# Patient Record
Sex: Female | Born: 1940 | Race: White | Hispanic: No | Marital: Married | Smoking: Former smoker
Health system: Southern US, Community
[De-identification: ages and names within clinical notes are randomized; demographics above are authoritative.]

## PROBLEM LIST (undated history)

## (undated) DIAGNOSIS — K635 Polyp of colon: Secondary | ICD-10-CM

## (undated) DIAGNOSIS — R51 Headache: Secondary | ICD-10-CM

## (undated) DIAGNOSIS — R011 Cardiac murmur, unspecified: Secondary | ICD-10-CM

## (undated) DIAGNOSIS — R519 Headache, unspecified: Secondary | ICD-10-CM

## (undated) DIAGNOSIS — I10 Essential (primary) hypertension: Secondary | ICD-10-CM

## (undated) DIAGNOSIS — D649 Anemia, unspecified: Secondary | ICD-10-CM

## (undated) DIAGNOSIS — I671 Cerebral aneurysm, nonruptured: Secondary | ICD-10-CM

## (undated) DIAGNOSIS — J302 Other seasonal allergic rhinitis: Secondary | ICD-10-CM

## (undated) DIAGNOSIS — C50919 Malignant neoplasm of unspecified site of unspecified female breast: Secondary | ICD-10-CM

## (undated) DIAGNOSIS — Z973 Presence of spectacles and contact lenses: Secondary | ICD-10-CM

## (undated) DIAGNOSIS — E039 Hypothyroidism, unspecified: Secondary | ICD-10-CM

## (undated) DIAGNOSIS — Z9221 Personal history of antineoplastic chemotherapy: Secondary | ICD-10-CM

## (undated) DIAGNOSIS — E7800 Pure hypercholesterolemia, unspecified: Secondary | ICD-10-CM

## (undated) DIAGNOSIS — M1990 Unspecified osteoarthritis, unspecified site: Secondary | ICD-10-CM

## (undated) DIAGNOSIS — H269 Unspecified cataract: Secondary | ICD-10-CM

## (undated) DIAGNOSIS — G629 Polyneuropathy, unspecified: Secondary | ICD-10-CM

## (undated) DIAGNOSIS — L719 Rosacea, unspecified: Secondary | ICD-10-CM

## (undated) DIAGNOSIS — G459 Transient cerebral ischemic attack, unspecified: Secondary | ICD-10-CM

## (undated) DIAGNOSIS — K4090 Unilateral inguinal hernia, without obstruction or gangrene, not specified as recurrent: Secondary | ICD-10-CM

## (undated) DIAGNOSIS — R42 Dizziness and giddiness: Secondary | ICD-10-CM

## (undated) DIAGNOSIS — I679 Cerebrovascular disease, unspecified: Secondary | ICD-10-CM

## (undated) DIAGNOSIS — Z923 Personal history of irradiation: Secondary | ICD-10-CM

## (undated) HISTORY — DX: Unilateral inguinal hernia, without obstruction or gangrene, not specified as recurrent: K40.90

## (undated) HISTORY — PX: ESOPHAGOGASTRODUODENOSCOPY: SHX1529

## (undated) HISTORY — DX: Unspecified cataract: H26.9

## (undated) HISTORY — PX: COLONOSCOPY W/ POLYPECTOMY: SHX1380

## (undated) HISTORY — DX: Cerebrovascular disease, unspecified: I67.9

## (undated) HISTORY — DX: Anemia, unspecified: D64.9

## (undated) HISTORY — DX: Malignant neoplasm of unspecified site of unspecified female breast: C50.919

## (undated) HISTORY — DX: Personal history of irradiation: Z92.3

## (undated) HISTORY — DX: Cerebral aneurysm, nonruptured: I67.1

## (undated) HISTORY — DX: Pure hypercholesterolemia, unspecified: E78.00

## (undated) HISTORY — DX: Hypothyroidism, unspecified: E03.9

## (undated) HISTORY — DX: Essential (primary) hypertension: I10

## (undated) HISTORY — PX: MASTECTOMY: SHX3

## (undated) HISTORY — PX: COLONOSCOPY: SHX174

## (undated) HISTORY — DX: Polyp of colon: K63.5

## (undated) HISTORY — DX: Transient cerebral ischemic attack, unspecified: G45.9

---

## 1983-03-07 ENCOUNTER — Encounter: Payer: Self-pay | Admitting: Internal Medicine

## 1999-01-31 ENCOUNTER — Other Ambulatory Visit: Admission: RE | Admit: 1999-01-31 | Discharge: 1999-01-31 | Payer: Self-pay | Admitting: Obstetrics and Gynecology

## 1999-05-26 ENCOUNTER — Encounter: Payer: Self-pay | Admitting: Internal Medicine

## 2000-01-27 ENCOUNTER — Other Ambulatory Visit: Admission: RE | Admit: 2000-01-27 | Discharge: 2000-01-27 | Payer: Self-pay | Admitting: Obstetrics and Gynecology

## 2000-04-05 ENCOUNTER — Encounter (INDEPENDENT_AMBULATORY_CARE_PROVIDER_SITE_OTHER): Payer: Self-pay

## 2000-04-05 ENCOUNTER — Other Ambulatory Visit: Admission: RE | Admit: 2000-04-05 | Discharge: 2000-04-05 | Payer: Self-pay | Admitting: Obstetrics and Gynecology

## 2001-02-21 ENCOUNTER — Other Ambulatory Visit: Admission: RE | Admit: 2001-02-21 | Discharge: 2001-02-21 | Payer: Self-pay | Admitting: Obstetrics and Gynecology

## 2002-03-11 ENCOUNTER — Other Ambulatory Visit: Admission: RE | Admit: 2002-03-11 | Discharge: 2002-03-11 | Payer: Self-pay | Admitting: Obstetrics and Gynecology

## 2002-08-14 HISTORY — PX: OTHER SURGICAL HISTORY: SHX169

## 2003-01-07 ENCOUNTER — Encounter: Payer: Self-pay | Admitting: Pulmonary Disease

## 2003-01-07 ENCOUNTER — Ambulatory Visit (HOSPITAL_COMMUNITY): Admission: RE | Admit: 2003-01-07 | Discharge: 2003-01-07 | Payer: Self-pay | Admitting: Pulmonary Disease

## 2003-01-08 ENCOUNTER — Inpatient Hospital Stay (HOSPITAL_COMMUNITY): Admission: EM | Admit: 2003-01-08 | Discharge: 2003-01-10 | Payer: Self-pay | Admitting: Emergency Medicine

## 2003-01-08 ENCOUNTER — Encounter: Payer: Self-pay | Admitting: Pulmonary Disease

## 2003-01-09 ENCOUNTER — Encounter: Payer: Self-pay | Admitting: Pulmonary Disease

## 2003-01-12 ENCOUNTER — Encounter: Payer: Self-pay | Admitting: Neurology

## 2003-01-12 ENCOUNTER — Ambulatory Visit (HOSPITAL_COMMUNITY): Admission: RE | Admit: 2003-01-12 | Discharge: 2003-01-12 | Payer: Self-pay | Admitting: Neurology

## 2003-01-21 ENCOUNTER — Ambulatory Visit (HOSPITAL_COMMUNITY): Admission: RE | Admit: 2003-01-21 | Discharge: 2003-01-22 | Payer: Self-pay | Admitting: Interventional Radiology

## 2003-04-08 ENCOUNTER — Other Ambulatory Visit: Admission: RE | Admit: 2003-04-08 | Discharge: 2003-04-08 | Payer: Self-pay | Admitting: Obstetrics and Gynecology

## 2003-05-14 ENCOUNTER — Ambulatory Visit (HOSPITAL_COMMUNITY): Admission: RE | Admit: 2003-05-14 | Discharge: 2003-05-14 | Payer: Self-pay | Admitting: Rheumatology

## 2003-10-09 ENCOUNTER — Ambulatory Visit (HOSPITAL_COMMUNITY): Admission: RE | Admit: 2003-10-09 | Discharge: 2003-10-09 | Payer: Self-pay | Admitting: Interventional Radiology

## 2004-04-22 ENCOUNTER — Ambulatory Visit (HOSPITAL_COMMUNITY): Admission: RE | Admit: 2004-04-22 | Discharge: 2004-04-22 | Payer: Self-pay | Admitting: Interventional Radiology

## 2004-08-14 DIAGNOSIS — I639 Cerebral infarction, unspecified: Secondary | ICD-10-CM

## 2004-08-14 HISTORY — DX: Cerebral infarction, unspecified: I63.9

## 2004-09-21 ENCOUNTER — Ambulatory Visit: Payer: Self-pay | Admitting: Pulmonary Disease

## 2004-09-22 ENCOUNTER — Ambulatory Visit: Payer: Self-pay | Admitting: Pulmonary Disease

## 2005-02-17 ENCOUNTER — Ambulatory Visit (HOSPITAL_COMMUNITY): Admission: RE | Admit: 2005-02-17 | Discharge: 2005-02-17 | Payer: Self-pay | Admitting: Interventional Radiology

## 2005-11-13 ENCOUNTER — Ambulatory Visit: Payer: Self-pay | Admitting: Pulmonary Disease

## 2005-12-27 ENCOUNTER — Ambulatory Visit: Payer: Self-pay | Admitting: Pulmonary Disease

## 2006-09-27 ENCOUNTER — Ambulatory Visit: Payer: Self-pay | Admitting: Pulmonary Disease

## 2006-10-18 ENCOUNTER — Ambulatory Visit: Payer: Self-pay | Admitting: Pulmonary Disease

## 2006-10-18 LAB — CONVERTED CEMR LAB
AST: 36 units/L (ref 0–37)
Albumin: 4.2 g/dL (ref 3.5–5.2)
Basophils Absolute: 0.2 10*3/uL — ABNORMAL HIGH (ref 0.0–0.1)
Basophils Relative: 4.1 % — ABNORMAL HIGH (ref 0.0–1.0)
Bilirubin, Direct: 0.2 mg/dL (ref 0.0–0.3)
CO2: 29 meq/L (ref 19–32)
Chloride: 106 meq/L (ref 96–112)
Cholesterol: 180 mg/dL (ref 0–200)
Creatinine, Ser: 0.7 mg/dL (ref 0.4–1.2)
Direct LDL: 104.7 mg/dL
Eosinophils Relative: 4.1 % (ref 0.0–5.0)
GFR calc non Af Amer: 90 mL/min
HDL: 49.1 mg/dL (ref >39.0)
Lymphocytes Relative: 35.9 % (ref 12.0–46.0)
MCV: 86.6 fL (ref 78.0–100.0)
Monocytes Absolute: 0.5 10*3/uL (ref 0.2–0.7)
Potassium: 4.5 meq/L (ref 3.5–5.1)
RBC: 4.83 M/uL (ref 3.87–5.11)
Total CHOL/HDL Ratio: 3.7
Total Protein: 7.3 g/dL (ref 6.0–8.3)
Triglycerides: 206 mg/dL (ref 0–149)
WBC: 5.8 10*3/uL (ref 4.5–10.5)

## 2006-10-23 ENCOUNTER — Ambulatory Visit: Payer: Self-pay | Admitting: Pulmonary Disease

## 2007-12-02 DIAGNOSIS — F411 Generalized anxiety disorder: Secondary | ICD-10-CM | POA: Insufficient documentation

## 2007-12-02 DIAGNOSIS — M1990 Unspecified osteoarthritis, unspecified site: Secondary | ICD-10-CM

## 2007-12-02 DIAGNOSIS — G459 Transient cerebral ischemic attack, unspecified: Secondary | ICD-10-CM

## 2007-12-02 DIAGNOSIS — M810 Age-related osteoporosis without current pathological fracture: Secondary | ICD-10-CM

## 2007-12-02 DIAGNOSIS — E039 Hypothyroidism, unspecified: Secondary | ICD-10-CM

## 2007-12-02 DIAGNOSIS — E7800 Pure hypercholesterolemia, unspecified: Secondary | ICD-10-CM | POA: Insufficient documentation

## 2007-12-02 DIAGNOSIS — IMO0001 Reserved for inherently not codable concepts without codable children: Secondary | ICD-10-CM

## 2008-05-12 ENCOUNTER — Ambulatory Visit: Payer: Self-pay | Admitting: Pulmonary Disease

## 2008-05-12 DIAGNOSIS — I10 Essential (primary) hypertension: Secondary | ICD-10-CM | POA: Insufficient documentation

## 2008-05-12 DIAGNOSIS — K649 Unspecified hemorrhoids: Secondary | ICD-10-CM | POA: Insufficient documentation

## 2008-05-14 ENCOUNTER — Ambulatory Visit: Payer: Self-pay | Admitting: Pulmonary Disease

## 2008-05-14 LAB — CONVERTED CEMR LAB: Vit D, 1,25-Dihydroxy: 54 (ref 30–89)

## 2008-05-21 LAB — CONVERTED CEMR LAB
Alkaline Phosphatase: 51 units/L (ref 39–117)
BUN: 10 mg/dL (ref 6–23)
Basophils Absolute: 0 10*3/uL (ref 0.0–0.1)
Basophils Relative: 0.6 % (ref 0.0–3.0)
Bilirubin, Direct: 0.2 mg/dL (ref 0.0–0.3)
CO2: 29 meq/L (ref 19–32)
Creatinine, Ser: 0.7 mg/dL (ref 0.4–1.2)
Eosinophils Relative: 4 % (ref 0.0–5.0)
GFR calc Af Amer: 108 mL/min
GFR calc non Af Amer: 89 mL/min
HCT: 40.6 % (ref 36.0–46.0)
Hemoglobin, Urine: NEGATIVE
Hemoglobin: 13.8 g/dL (ref 12.0–15.0)
Ketones, ur: NEGATIVE mg/dL
Leukocytes, UA: NEGATIVE
MCV: 88.6 fL (ref 78.0–100.0)
Monocytes Relative: 4.5 % (ref 3.0–12.0)
Neutro Abs: 4.6 10*3/uL (ref 1.4–7.7)
Neutrophils Relative %: 65.2 % (ref 43.0–77.0)
Platelets: 294 10*3/uL (ref 150–400)
RBC: 4.58 M/uL (ref 3.87–5.11)
RDW: 12 % (ref 11.5–14.6)
TSH: 0.64 microintl units/mL (ref 0.35–5.50)
Total Bilirubin: 1 mg/dL (ref 0.3–1.2)
Total CHOL/HDL Ratio: 3.7
Triglycerides: 178 mg/dL — ABNORMAL HIGH (ref 0–149)

## 2008-06-02 ENCOUNTER — Telehealth: Payer: Self-pay | Admitting: Pulmonary Disease

## 2008-06-09 ENCOUNTER — Ambulatory Visit (HOSPITAL_COMMUNITY): Admission: RE | Admit: 2008-06-09 | Discharge: 2008-06-09 | Payer: Self-pay | Admitting: Interventional Radiology

## 2008-06-12 ENCOUNTER — Encounter: Payer: Self-pay | Admitting: Interventional Radiology

## 2008-06-21 ENCOUNTER — Emergency Department (HOSPITAL_COMMUNITY): Admission: EM | Admit: 2008-06-21 | Discharge: 2008-06-22 | Payer: Self-pay | Admitting: Emergency Medicine

## 2008-06-22 ENCOUNTER — Encounter (INDEPENDENT_AMBULATORY_CARE_PROVIDER_SITE_OTHER): Payer: Self-pay | Admitting: *Deleted

## 2008-07-08 ENCOUNTER — Encounter: Payer: Self-pay | Admitting: Pulmonary Disease

## 2008-10-29 ENCOUNTER — Emergency Department (HOSPITAL_COMMUNITY): Admission: EM | Admit: 2008-10-29 | Discharge: 2008-10-29 | Payer: Self-pay | Admitting: Emergency Medicine

## 2009-04-26 ENCOUNTER — Encounter (INDEPENDENT_AMBULATORY_CARE_PROVIDER_SITE_OTHER): Payer: Self-pay | Admitting: *Deleted

## 2009-06-02 ENCOUNTER — Telehealth: Payer: Self-pay | Admitting: Pulmonary Disease

## 2009-06-07 ENCOUNTER — Telehealth (INDEPENDENT_AMBULATORY_CARE_PROVIDER_SITE_OTHER): Payer: Self-pay | Admitting: *Deleted

## 2009-06-08 ENCOUNTER — Ambulatory Visit: Payer: Self-pay | Admitting: Internal Medicine

## 2009-06-16 ENCOUNTER — Ambulatory Visit: Payer: Self-pay | Admitting: Internal Medicine

## 2009-06-16 ENCOUNTER — Encounter: Payer: Self-pay | Admitting: Internal Medicine

## 2009-06-17 ENCOUNTER — Encounter: Payer: Self-pay | Admitting: Internal Medicine

## 2009-06-21 ENCOUNTER — Telehealth: Payer: Self-pay | Admitting: Pulmonary Disease

## 2009-07-30 ENCOUNTER — Ambulatory Visit (HOSPITAL_COMMUNITY): Admission: RE | Admit: 2009-07-30 | Discharge: 2009-07-30 | Payer: Self-pay | Admitting: Interventional Radiology

## 2009-08-26 ENCOUNTER — Ambulatory Visit: Payer: Self-pay | Admitting: Pulmonary Disease

## 2009-08-26 DIAGNOSIS — D126 Benign neoplasm of colon, unspecified: Secondary | ICD-10-CM | POA: Insufficient documentation

## 2009-08-28 DIAGNOSIS — I671 Cerebral aneurysm, nonruptured: Secondary | ICD-10-CM

## 2009-08-28 DIAGNOSIS — I679 Cerebrovascular disease, unspecified: Secondary | ICD-10-CM

## 2009-08-28 LAB — CONVERTED CEMR LAB
ALT: 37 units/L — ABNORMAL HIGH (ref 0–35)
AST: 29 units/L (ref 0–37)
Alkaline Phosphatase: 55 units/L (ref 39–117)
BUN: 11 mg/dL (ref 6–23)
Bilirubin, Direct: 0.1 mg/dL (ref 0.0–0.3)
Chloride: 101 meq/L (ref 96–112)
Creatinine, Ser: 0.7 mg/dL (ref 0.4–1.2)
Direct LDL: 94.7 mg/dL
Eosinophils Absolute: 0.2 10*3/uL (ref 0.0–0.7)
GFR calc non Af Amer: 88.72 mL/min (ref >60)
HDL: 46.6 mg/dL (ref >39.00)
Monocytes Relative: 7.3 % (ref 3.0–12.0)
Platelets: 220 10*3/uL (ref 150.0–400.0)
Potassium: 3.9 meq/L (ref 3.5–5.1)
WBC: 6 10*3/uL (ref 4.5–10.5)

## 2009-09-03 ENCOUNTER — Telehealth: Payer: Self-pay | Admitting: Pulmonary Disease

## 2010-01-03 ENCOUNTER — Encounter: Payer: Self-pay | Admitting: Pulmonary Disease

## 2010-05-03 ENCOUNTER — Telehealth (INDEPENDENT_AMBULATORY_CARE_PROVIDER_SITE_OTHER): Payer: Self-pay | Admitting: *Deleted

## 2010-07-06 ENCOUNTER — Telehealth: Payer: Self-pay | Admitting: Pulmonary Disease

## 2010-07-12 ENCOUNTER — Ambulatory Visit: Payer: Self-pay | Admitting: Pulmonary Disease

## 2010-08-29 ENCOUNTER — Ambulatory Visit
Admission: RE | Admit: 2010-08-29 | Discharge: 2010-08-29 | Payer: Self-pay | Source: Home / Self Care | Attending: Pulmonary Disease | Admitting: Pulmonary Disease

## 2010-08-31 ENCOUNTER — Ambulatory Visit
Admission: RE | Admit: 2010-08-31 | Discharge: 2010-08-31 | Payer: Self-pay | Source: Home / Self Care | Attending: Pulmonary Disease | Admitting: Pulmonary Disease

## 2010-08-31 ENCOUNTER — Other Ambulatory Visit: Payer: Self-pay | Admitting: Pulmonary Disease

## 2010-08-31 LAB — BASIC METABOLIC PANEL
BUN: 13 mg/dL (ref 6–23)
CO2: 29 mEq/L (ref 19–32)
Calcium: 9.8 mg/dL (ref 8.4–10.5)
Chloride: 105 mEq/L (ref 96–112)
Creatinine, Ser: 0.6 mg/dL (ref 0.4–1.2)
GFR: 101.75 mL/min (ref >60.00)
Glucose, Bld: 96 mg/dL (ref 70–99)
Potassium: 4.6 mEq/L (ref 3.5–5.1)
Sodium: 140 mEq/L (ref 135–145)

## 2010-08-31 LAB — CBC WITH DIFFERENTIAL/PLATELET
Basophils Absolute: 0 10*3/uL (ref 0.0–0.1)
Basophils Relative: 0.7 % (ref 0.0–3.0)
Eosinophils Absolute: 0.2 10*3/uL (ref 0.0–0.7)
Eosinophils Relative: 3.8 % (ref 0.0–5.0)
HCT: 39.6 % (ref 36.0–46.0)
Hemoglobin: 13.8 g/dL (ref 12.0–15.0)
Lymphocytes Relative: 41.2 % (ref 12.0–46.0)
Lymphs Abs: 2.2 10*3/uL (ref 0.7–4.0)
MCHC: 34.8 g/dL (ref 30.0–36.0)
MCV: 87.3 fl (ref 78.0–100.0)
Monocytes Absolute: 0.4 10*3/uL (ref 0.1–1.0)
Monocytes Relative: 7.7 % (ref 3.0–12.0)
Neutro Abs: 2.5 10*3/uL (ref 1.4–7.7)
Neutrophils Relative %: 46.6 % (ref 43.0–77.0)
Platelets: 291 10*3/uL (ref 150.0–400.0)
RBC: 4.54 Mil/uL (ref 3.87–5.11)
RDW: 13.4 % (ref 11.5–14.6)
WBC: 5.3 10*3/uL (ref 4.5–10.5)

## 2010-08-31 LAB — HEPATIC FUNCTION PANEL
ALT: 32 U/L (ref 0–35)
AST: 26 U/L (ref 0–37)
Albumin: 4 g/dL (ref 3.5–5.2)
Alkaline Phosphatase: 54 U/L (ref 39–117)
Bilirubin, Direct: 0.1 mg/dL (ref 0.0–0.3)
Total Bilirubin: 0.9 mg/dL (ref 0.3–1.2)
Total Protein: 7 g/dL (ref 6.0–8.3)

## 2010-08-31 LAB — LIPID PANEL
Cholesterol: 173 mg/dL (ref 0–200)
HDL: 45.4 mg/dL (ref >39.00)
LDL Cholesterol: 95 mg/dL (ref 0–99)
Total CHOL/HDL Ratio: 4
Triglycerides: 164 mg/dL — ABNORMAL HIGH (ref 0.0–149.0)
VLDL: 32.8 mg/dL (ref 0.0–40.0)

## 2010-08-31 LAB — TSH: TSH: 0.2 u[IU]/mL — ABNORMAL LOW (ref 0.35–5.50)

## 2010-09-04 ENCOUNTER — Encounter: Payer: Self-pay | Admitting: Interventional Radiology

## 2010-09-13 NOTE — Progress Notes (Signed)
Summary: rx  Phone Note Call from Patient   Caller: Patient Call For: [name] Summary of Call: need [facility]from [facility].  Waiting for mail order pharmacy to start sending her 90 day supply of meds. Initial call taken by: Eugene Gavia,  September 03, 2009 1:49 PM  Follow-up for Phone Call        30 dy supply of meds pt [facility] to [facility] tolast pt until she receives her 90 day supply from mail order. Carron Curie CMA  September 03, 2009 2:02 PM     Prescriptions: EFFEXOR XR 75 MG XR24H-CAP (VENLAFAXINE HCL) take 1 tab by mouth once daily... Brand medically necessary #30 x 0   Entered by:   Carron Curie CMA   Authorized by:   [name] MD   Signed by:   Carron Curie CMA on 09/03/2009   Method used:   Electronically to        [facility]  [phone]* (retail)       [address]       [place], [place]  [address]       Ph: [phone] or [phone]       Fax: [phone]   RxID:   [id number] SYNTHROID 125 MCG  TABS (LEVOTHYROXINE SODIUM) Take 1 tablet by mouth once a day Brand medically necessary #30 x 0   Entered by:   Carron Curie CMA   Authorized by:   [name] MD   Signed by:   Carron Curie CMA on 09/03/2009   Method used:   Electronically to        [facility]  [phone]* (retail)       [address]       [place], [place]  [address]       Ph: [phone] or [phone]       Fax: [phone]   RxID:   [id number] LISINOPRIL-HYDROCHLOROTHIAZIDE 20-12.5 MG  TABS (LISINOPRIL-HYDROCHLOROTHIAZIDE) Take 1 tablet by mouth once a day  #30 x 0   Entered by:   Carron Curie CMA   Authorized by:   [name] MD   Signed by:   Carron Curie CMA on 09/03/2009   Method used:   Electronically to        [facility]  [phone]* (retail)       [address]       [place], [place]  [address]       Ph: [phone] or  [phone]       Fax: [phone]   RxID:   [id number] TOPROL XL 100 MG  TB24 (METOPROLOL SUCCINATE) Take 1 tablet by mouth once a day  #30 x 0   Entered by:   Carron Curie CMA   Authorized by:   [name] MD   Signed by:   Carron Curie CMA on 09/03/2009   Method used:   Electronically to        [facility]  [phone]* (retail)       [address]       [place], [place]  [address]       Ph: [phone] or [phone]       Fax: [phone]   RxID:   [id number]

## 2010-09-13 NOTE — Assessment & Plan Note (Signed)
Summary: Acute NP office visit - sinusitis   Primary Provider/Referring Provider:  Alroy Dust, MD  CC:  sinsu pressure/congestion with bloody nasal drainage this morning, PND, and DOE x20month.  History of Present Illness: 70 y/o WF here for a follow up visit... she has multiple medical problems as noted below...    ~  2023-05-03:  she states that she has been doing well and wants to get her meds refilled today... she will need to ret for FASTING blood work... she refuses the Flu Vaccines...   ~  August 26, 2009:  states she's had a good yr- no new complaints or concerns... BP controlled on meds & needs refill perscriptions today... 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 she understood Debra Mcintyre to say everything looked good, no changes, same Rx...  she also had f/u colonoscopy by Debra Mcintyre 11/10 w/ 2 polyps removed.  July 12, 2010 --Presents for an acute office visit. Complains of sinus  pressure/congestion with bloody nasal drainage this morning, PND, DOE x84month. Worse over last 5 days, sinus pain and pressure very bad. OTC meds are not helping. Sinus congestion thick w/ green mucus, blood tinged sinus drainage. Denies chest pain,  orthopnea, hemoptysis, fever, n/v/d, edema, headache.       Medications Prior to Update: 1)  Bayer Aspirin 325 Mg  Tabs (Aspirin) .... Take 1 Tablet By Mouth Once A Day 2)  Plavix 75 Mg  Tabs (Clopidogrel Bisulfate) .... Take 1 Tablet By Mouth Once A Day 3)  Toprol Xl 100 Mg  Tb24 (Metoprolol Succinate) .... Take 1 Tablet By Mouth Once A Day 4)  Lisinopril-Hydrochlorothiazide 20-12.5 Mg  Tabs (Lisinopril-Hydrochlorothiazide) .... Take 1 Tablet By Mouth Once A Day 5)  Lipitor 80 Mg  Tabs  (Atorvastatin Calcium) .... Take One Tablet By Mouth At Bedtime 6)  Synthroid 125 Mcg  Tabs (Levothyroxine Sodium) .... Take 1 Tablet By Mouth Once A Day 7)  Calcium 500 500 Mg  Tabs (Calcium Carbonate) .... Take One Tablet By Mouth Two Times A Day 8)  Multivitamins   Tabs (Multiple Vitamin) .... Take 1 Tablet By Mouth Once A Day 9)  Effexor Xr 75 Mg Xr24h-Cap (Venlafaxine Hcl) .... Take 1 Tab By Mouth Once Daily...  Current Medications (verified): 1)  Bayer Aspirin 325 Mg  Tabs (Aspirin) .... Take 1 Tablet By Mouth Once A Day 2)  Plavix 75 Mg  Tabs (Clopidogrel Bisulfate) .... Take 1 Tablet By Mouth Once A Day 3)  Toprol Xl 100 Mg  Tb24 (Metoprolol Succinate) .... Take 1 Tablet By Mouth Once A Day 4)  Lisinopril-Hydrochlorothiazide 20-12.5 Mg  Tabs (Lisinopril-Hydrochlorothiazide) .... Take 1 Tablet By Mouth Once A Day 5)  Lipitor 80 Mg  Tabs (Atorvastatin Calcium) .... Take One Tablet By Mouth At Bedtime 6)  Synthroid 125 Mcg  Tabs (Levothyroxine Sodium) .... Take 1 Tablet By Mouth Once A Day 7)  Calcium 500 500 Mg  Tabs (Calcium Carbonate) .... Take One Tablet By Mouth Two Times A Day 8)  Multivitamins   Tabs (Multiple Vitamin) .... Take 1 Tablet By Mouth Once A Day 9)  Effexor Xr 75 Mg Xr24h-Cap (Venlafaxine Hcl) .... Take 1 Tab By Mouth Once Daily...  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) HYPERCHOLESTEROLEMIA (ICD-272.0) HYPOTHYROIDISM (ICD-244.9) COLONIC POLYPS (ICD-211.3) Hx of HEMORRHOIDS (ICD-455.6) DEGENERATIVE JOINT DISEASE (ICD-715.90) FIBROMYALGIA (ICD-729.1) OSTEOPOROSIS (ICD-733.00) TIA (ICD-435.9) ANXIETY (ICD-300.00)  Past Surgical History: Last updated: 06/02/2009 unremarkable  Family History: Last updated: 2009-09-01 mother deceased age 63--hx of lung cancer father deceased age 13, hx  heart disease 2 Siblings: Sister alive age 24 Brother deceased age 101 from alcoholism & hx of colon cancer  Social History: Last updated: 07/12/2010 quit smoking in 1988----smoked for 21 years no caffeine use quit drinking in 1988 married- 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 no children declines flu shot 07-12-10  Risk Factors: Smoking Status: quit (05/12/2008)  Social History: quit smoking in 1988----smoked for 21 years no caffeine use quit drinking in 1988 married- 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 no children declines flu shot 07-12-10  Review of Systems      See HPI  Vital Signs:  Patient profile:   70 year old female Height:      69 inches Weight:      176.13 pounds BMI:     26.10 O2 Sat:      97 % on Room air Temp:     99.0 degrees F oral Pulse rate:   73 / minute BP sitting:   120 / 66  (left arm) Cuff size:   regular  Vitals Entered By: Boone Master CNA/MA (July 12, 2010 4:41 PM)  O2 Flow:  Room air CC: sinsu pressure/congestion with bloody nasal drainage this morning, PND, DOE x72month Is Patient Diabetic? No Comments Medications reviewed with patient Daytime contact number verified with patient. Boone Master CNA/MA  July 12, 2010 4:40 PM    Physical Exam  Additional Exam:  WD, WN, 70 y/o WF in NAD... GEN: A/Ox3; pleasant , NAD HEENT:  [place]/AT, , EACs-clear, TMs-wnl, NOSE-clear discharge, sinus max tenderness  THROAT-clear NECK:  Supple w/ fair ROM; no JVD; normal carotid impulses w/o bruits; no thyromegaly or nodules palpated; no lymphadenopathy. RESP  Clear to P & A; w/o, wheezes/ rales/ or rhonchi. CARD:  RRR, no m/r/g   GI:   Soft & nt; nml bowel sounds; no organomegaly or masses detected. Musco: Warm bil,  no calf tenderness edema, clubbing, pulses intact Neuro: intact w/ no focal deficits noted.    Impression & Recommendations:  Problem # 1:  SINUSITIS, ACUTE (ICD-461.9)  Augmeintin 875mg  two times a day for 10 days.  Mucinex DM two times a day as needed  cough/congestion  Increase flluds Saline nasal rinse as needed  follow up Debra Mcintyre as scheduled in 1 month  Please contact office for sooner follow up if symptoms do not improve or worsen  Her updated medication list for this problem includes:    Augmentin 875-125 Mg Tabs (Amoxicillin-pot clavulanate) .Marland Kitchen... 1 by mouth two times a day  Orders: Est. Patient Level IV (11914)  Medications Added to Medication List This Visit: 1)  Augmentin 875-125 Mg Tabs (Amoxicillin-pot clavulanate) .Marland Kitchen.. 1 by mouth two times a day  Complete Medication List: 1)  Bayer Aspirin 325 Mg Tabs (Aspirin) .... Take 1 tablet by mouth once a day 2)  Plavix 75 Mg Tabs (Clopidogrel bisulfate) .... Take 1 tablet by mouth once a day 3)  Toprol Xl 100 Mg Tb24 (Metoprolol succinate) .... Take 1 tablet by mouth once a day 4)  Lisinopril-hydrochlorothiazide 20-12.5 Mg Tabs (Lisinopril-hydrochlorothiazide) .... Take 1 tablet by mouth once a day 5)  Lipitor  80 Mg Tabs (Atorvastatin calcium) .... Take one tablet by mouth at bedtime 6)  Synthroid 125 Mcg Tabs (Levothyroxine sodium) .... Take 1 tablet by mouth once a day 7)  Calcium 500 500 Mg Tabs (Calcium carbonate) .... Take one tablet by mouth two times a day 8)  Multivitamins Tabs (Multiple vitamin) .... Take 1 tablet by mouth once a day 9)  Effexor Xr 75 Mg Xr24h-cap (Venlafaxine hcl) .... Take 1 tab by mouth once daily... 10)  Augmentin 875-125 Mg Tabs (Amoxicillin-pot clavulanate) .Marland Kitchen.. 1 by mouth two times a day  Patient Instructions: 1)  Augmeintin 875mg  two times a day for 10 days.  2)  Mucinex DM two times a day as needed cough/congestion  3)  Increase flluds 4)  Saline nasal rinse as needed  5)  follow up Debra Mcintyre as scheduled in 1 month  6)  Please contact office for sooner follow up if symptoms do not improve or worsen  Prescriptions: AUGMENTIN 875-125 MG TABS (AMOXICILLIN-POT CLAVULANATE) 1 by mouth two times a day  #20 x 0   Entered and Authorized by:    Rubye Oaks NP   Signed by:   Rubye Oaks NP on 07/12/2010   Method used:   Electronically to        [facility]  [phone]* (retail)       [address]       [place], [place]  [address]       Ph: [phone] or [phone]       Fax: [phone]   RxID:   [id number]

## 2010-09-13 NOTE — Progress Notes (Signed)
Summary: meds  Phone Note Call from Patient Call back at Home Phone [phone]   Caller: Patient Call For: nadel Reason for Call: Talk to Nurse Summary of Call: Patient calling about meds.  Synthroid, metroprolol,venlafaxine needing refills and was told that she needed to contact office first. Initial call taken by: Lehman Prom,  May 03, 2010 2:47 PM  Follow-up for Phone Call        Spoke with pt.  She states that the rxs that were given at last ov were originally going to go to her [facility], but she sent this to retail pharm intead and now needs refills. Rxs were sent to pharm. Follow-up by: Vernie Murders,  May 03, 2010 3:05 PM    Prescriptions: EFFEXOR XR 75 MG XR24H-CAP (VENLAFAXINE HCL) take 1 tab by mouth once daily...  #30 x 3   Entered by:   Vernie Murders   Authorized by:   [name] MD   Signed by:   Vernie Murders on 05/03/2010   Method used:   Electronically to        [facility]  [phone]* (retail)       [address]       [place], [place]  [address]       Ph: [phone] or [phone]       Fax: [phone]   RxID:   [id number] SYNTHROID 125 MCG  TABS (LEVOTHYROXINE SODIUM) Take 1 tablet by mouth once a day Brand medically necessary #30 x 3   Entered by:   Vernie Murders   Authorized by:   [name] MD   Signed by:   Vernie Murders on 05/03/2010   Method used:   Electronically to        [facility]  [phone]* (retail)       [address]       [place], [place]  [address]       Ph: [phone] or [phone]       Fax: [phone]   RxID:   [id number] TOPROL XL 100 MG  TB24 (METOPROLOL SUCCINATE) Take 1 tablet by mouth once a day  #30 x 3   Entered by:   Vernie Murders   Authorized by:   [name] MD   Signed by:   Vernie Murders on 05/03/2010   Method used:   Electronically to        [facility]  [phone]* (retail)       [address]       [place], [place]  [address]       Ph: [phone] or [phone]       Fax: [phone]   RxID:   [id number]

## 2010-09-13 NOTE — Assessment & Plan Note (Signed)
Summary: yearly follow up/la   Primary Care Provider:  Alroy Dust, MD  CC:  16 month ROV & review of mult medical problems....  History of Present Illness: 70 y/o WF here for a follow up visit... Debra Mcintyre has multiple medical problems as noted below...    ~  May 11, 2023:  Debra Mcintyre states that Debra Mcintyre has been doing well and wants to get her meds refilled today... Debra Mcintyre will need to ret for FASTING blood work... Debra Mcintyre refuses the Flu Vaccines...   ~  August 26, 2009:  states Debra Mcintyre's had a good yr- no new complaints or concerns... BP controlled on meds & needs refill perscriptions today... 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 Debra Mcintyre understood DrD to say everything looked good, no changes, same Rx...  Debra Mcintyre also had f/u colonoscopy by drDBrodie 11/10 w/ 2 polyps removed.    Current Problem List:  HYPERTENSION (ICD-401.9) - on TOPROL XL 100mg /d,  LISINOPRIL/ Hct 20/12.5 daily... BP= 120/78 today and Debra Mcintyre says even better readings at homeat home... takes meds regularly & tol well... denies HA, fatigue, visual changes, CP, palipit, dizziness, syncope, dyspnea, edema, etc... Debra Mcintyre does water aerobics & walks for exercise...   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 Debra Mcintyre's been stable since that time w/ out pt f/u by IR, [name].  ~  CDopplers 5/04 showed mild plaque, no signif ICA stenoses...  ~  MR studies 10/09 showed sm vessel dis; poss restenosis left MCA w/ angiogram rec- but wasn't done; mod stenosis at origin of left vertebral art  w/ tortuosity...  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  HYPERCHOLESTEROLEMIA (ICD-272.0) - on LIPITOR 80mg /d,  ZETIA 10mg /d (Debra Mcintyre stopped prev FishOil supplements).  ~  FLP 3/08 shows Tchol 180, TG 206, HDL 49, LDL 105... rec same meds, better diet, get wt down.  ~  FLP 10/09 showed TChol 150, TG 178, HDL 41, LDL 74  ~  FLP 1/11 showed TChol 162, TG 226, HDL 47, LDL 95... Debra Mcintyre wants to stop Zetia, may need to add Fibrate- get on diet & get wt down.  HYPOTHYROIDISM (ICD-244.9) - on SYNTHROID 159mcg/d...  ~  labs 3/08 showed TSH = 0.23  ~  labs 10/09 showed TSH= 0.64  ~  labs 1/11 showed TSH= 0.18... Debra Mcintyre wants to keep same dose to aide wt reduction.  COLONIC POLYPS (ICD-211.3), & Hx of HEMORRHOIDS (ICD-455.6)  ~  colonoscopy 10/00 by DrDBrodie showed hems only... f/u planned 31yrs.  ~  f/u colonoscopy 11/10 showed 2 polyps- one adenomatous w/ f/u planned 15yrs.  DEGENERATIVE JOINT DISEASE (ICD-715.90) - uses Tylenol & OTC meds as needed... 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 second opinion from DrWWard @  [facility] confirmed this- no surg necessary...  ~  1/11: notes some pain in hands & wrist  FIBROMYALGIA (ICD-729.1)  OSTEOPOROSIS (ICD-733.00) - on Calcium, & Vitamins...  TIA (ICD-435.9) -  as above, Debra Mcintyre remains on ASA & Plavix... last saw DrReynolds in 2006...  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 arteriogram by Dr. Corliss Skains confirmed a web-like plaque in the left M1 segment of the left MCA with signif stenosis...  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 subseq PTA of left MCA w/ good result- resid 20% stenosis seen on f/u angiograms along w/ a 1mm saccular aneurysm seen in the right MCA  trifurcation...  ANXIETY (ICD-300.00) - on EFFEXOR 75mg /d for hot flashes, Debra Mcintyre says.  Health Maintenance - GYN= DrCousins & Debra Mcintyre will call for f/u... [facility]... BMDs at [facility] & results sent to DrCousins...  ~  Immunizations: Debra Mcintyre refuses Flu vaccine... we will give her a PNEUMOVAX today- 1/11, and Tdap today- 1/11...    Allergies (verified): No Known Drug Allergies  Comments:  Nurse/Medical Assistant: The patient's medications and allergies were reviewed with the patient and were updated in the Medication and Allergy Lists.  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  The patient denies fever, chills, sweats, anorexia, fatigue, weakness, malaise, weight loss, sleep disorder, blurring, diplopia, eye irritation, eye discharge, vision loss, eye pain, photophobia, earache, ear discharge, tinnitus, decreased hearing, nasal congestion, nosebleeds, sore throat, hoarseness, chest pain, palpitations, syncope, dyspnea on exertion, orthopnea, PND, peripheral edema, cough, dyspnea at rest, excessive sputum, hemoptysis, wheezing, pleurisy,  nausea, vomiting, diarrhea, constipation, change in bowel habits, abdominal pain, melena, hematochezia, jaundice, indigestion/heartburn, dysphagia, odynophagia, dysuria, hematuria, urinary frequency, urinary hesitancy, nocturia, incontinence, back pain, joint swelling, muscle cramps, muscle weakness, sciatica, restless legs, leg pain at night, leg pain with exertion, rash, itching, dryness, suspicious lesions, paralysis, paresthesias, seizures, tremors, vertigo, transient blindness, frequent falls, frequent headaches, difficulty walking, depression, memory loss, confusion, cold intolerance, heat intolerance, polydipsia, polyphagia, polyuria, unusual weight change, abnormal bruising, bleeding, enlarged lymph nodes, urticaria, allergic rash, hay fever, and recurrent infections.    Vital Signs:  Patient profile:   70 year old female Height:      69 inches Weight:      175 pounds O2 Sat:      98 % on Room air Temp:     97.7 degrees F oral Pulse rate:   70 / minute BP sitting:   120 / 78  (left arm) Cuff size:   regular  Vitals Entered By: Randell Loop CMA (August 26, 2009 9:28 AM)  O2 Sat at Rest %:  98 O2 Flow:  Room air CC: 16 month ROV & review of mult medical problems... Is Patient Diabetic? No Pain Assessment Patient in pain? no      Comments no changes in meds   Physical Exam  Additional Exam:  WD, WN, 70 y/o WF in NAD... GENERAL:  Alert & oriented; pleasant & cooperative... HEENT:  [place]/AT, EOM-wnl, PERRLA, EACs-clear, TMs-wnl, NOSE-clear, THROAT-clear & wnl. NECK:  Supple w/ fairROM; no JVD; normal carotid impulses w/o bruits; no thyromegaly or nodules palpated; no lymphadenopathy. CHEST:  Clear to P & A; without wheezes/ rales/ or rhonchi. HEART:  Regular Rhythm; without murmurs/ rubs/ or gallops. ABDOMEN:  Soft & nontender; normal bowel sounds; no organomegaly or masses palpated. EXT: without deformities, mild arthritic changes; no varicose veins/ +venous insuffic/ no  edema. NEURO:  CN's intact;  no focal neuro deficits... DERM:  No lesions noted; no rash etc...     MISC. Report  Procedure date:  08/26/2009  Findings:      Lipid Panel (LIPID)   Cholesterol               162 mg/dL                   8-413   Triglycerides        [H]  226.0 mg/dL                 2.4-401.0   HDL                       27.25 mg/dL                 >36.64 Cholesterol LDL - Direct                             94.7 mg/dL           CBC Platelet w/Diff (CBCD)   White Cell Count          6.0 K/uL                    4.5-10.5   Red Cell Count            4.72 Mil/uL                 3.87-5.11   Hemoglobin                14.0 g/dL                   40.3-47.4   Hematocrit                42.7 %                      36.0-46.0   MCV                       90.5 fl                     78.0-100.0   Platelet Count            220.0 K/uL                  150.0-400.0   Neutrophil %              54.0 %                      43.0-77.0   Lymphocyte %              35.4 %                      12.0-46.0  Monocyte %                7.3 %                       3.0-12.0   Eosinophils%              3.3 %                       0.0-5.0   Basophils %               0.0 %                       0.0-3.0  Comments:      BMP (METABOL)   Sodium                    135 mEq/L                   135-145   Potassium                 3.9 mEq/L                   3.5-5.1   Chloride                  101 mEq/L                   96-112   Carbon Dioxide            28 mEq/L                    19-32   Glucose                   95 mg/dL                    14-78   BUN                       11 mg/dL                    2-95   Creatinine                0.7 mg/dL                   6.2-1.3   Calcium                   9.6 mg/dL                   0.8-65.7   GFR                       88.72 mL/min                >60  Hepatic/Liver Function Panel (HEPATIC)   Total Bilirubin      [H]  1.4 mg/dL                   8.4-6.9   Direct  Bilirubin          0.1 mg/dL                   6.2-9.5   Alkaline Phosphatase      55 U/L  39-117   AST                       29 U/L                      0-37   ALT                  [H]  37 U/L                      0-35   Total Protein             7.3 g/dL                    4.4-0.1   Albumin                   4.3 g/dL                    0.2-7.2    FastTSH              [L]  0.18 uIU/mL                 0.35-5.50   Impression & Recommendations:  Problem # 1:  HYPERTENSION (ICD-401.9) Controlled on meds-  continue the same. Her updated medication list for this problem includes:    Toprol Xl 100 Mg Tb24 (Metoprolol succinate) .Marland Kitchen... Take 1 tablet by mouth once a day    Lisinopril-hydrochlorothiazide 20-12.5 Mg Tabs (Lisinopril-hydrochlorothiazide) .Marland Kitchen... Take 1 tablet by mouth once a day  Orders: Venipuncture (53664) TLB-Lipid Panel (80061-LIPID) TLB-CBC Platelet - w/Differential (85025-CBCD) TLB-BMP (Basic Metabolic Panel-BMET) (80048-METABOL) TLB-Hepatic/Liver Function Pnl (80076-HEPATIC) TLB-TSH (Thyroid Stimulating Hormone) (84443-TSH) T-Vitamin D (25-Hydroxy) (40347-42595)  Problem # 2:  CEREBROVASCULAR DISEASE (ICD-437.9) Recent f/u eval by DrDeveshwar reviewed w/ pt... continue ASA/ Plavix...  Problem # 3:  HYPERCHOLESTEROLEMIA (ICD-272.0) Debra Mcintyre stopped the Fish Oil & wants off the Zetia... Labs show incr TG & may need Fibrate... OK to continueon Lip80 for now but needs better diet & must get weight down... The following medications were removed from the medication list:    Zetia 10 Mg Tabs (Ezetimibe) .Marland Kitchen... Take one tablet by mouth at bedtime Her updated medication list for this problem includes:    Lipitor 80 Mg Tabs (Atorvastatin calcium) .Marland Kitchen... Take one tablet by mouth at bedtime  Problem # 4:  HYPOTHYROIDISM (ICD-244.9) OK to continue the current dose to aide wt reduction... Her updated medication list for this problem includes:    Synthroid 125 Mcg Tabs  (Levothyroxine sodium) .Marland Kitchen... Take 1 tablet by mouth once a day  Problem # 5:  COLONIC POLYPS (ICD-211.3) Had colonoscopy w/ adenomatous polyp removed... f/u due 70yrs.  Problem # 6:  DEGENERATIVE JOINT DISEASE (ICD-715.90) Mild arthritic complaints-  discussed OTC Rx... Her updated medication list for this problem includes:    Bayer Aspirin 325 Mg Tabs (Aspirin) .Marland Kitchen... Take 1 tablet by mouth once a day  Problem # 7:  ANXIETY (ICD-300.00) We will refill the Effexor... Her updated medication list for this problem includes:    Effexor Xr 75 Mg Xr24h-cap (Venlafaxine hcl) .Marland Kitchen... Take 1 tab by mouth once daily...  Problem # 8:  OTHER MEDICAL PROBLEMS AS NOTED>>> OK PNEUMOVAX & TDAP booster...  Complete Medication List: 1)  Bayer Aspirin 325 Mg Tabs (Aspirin) .... Take 1 tablet by mouth once a day 2)  Plavix 75 Mg Tabs (  Clopidogrel bisulfate) .... Take 1 tablet by mouth once a day 3)  Toprol Xl 100 Mg Tb24 (Metoprolol succinate) .... Take 1 tablet by mouth once a day 4)  Lisinopril-hydrochlorothiazide 20-12.5 Mg Tabs (Lisinopril-hydrochlorothiazide) .... Take 1 tablet by mouth once a day 5)  Lipitor 80 Mg Tabs (Atorvastatin calcium) .... Take one tablet by mouth at bedtime 6)  Synthroid 125 Mcg Tabs (Levothyroxine sodium) .... Take 1 tablet by mouth once a day 7)  Calcium 500 500 Mg Tabs (Calcium carbonate) .... Take one tablet by mouth two times a day 8)  Multivitamins Tabs (Multiple vitamin) .... Take 1 tablet by mouth once a day 9)  Effexor Xr 75 Mg Xr24h-cap (Venlafaxine hcl) .... Take 1 tab by mouth once daily...  Other Orders: Prescription Created Electronically [phone]) Pneumococcal Vaccine (60454) Admin 1st Vaccine (09811) Tdap => 65yrs IM (91478) Admin of Any Addtl Vaccine (29562)  Patient Instructions: 1)  Today we updated your med list- see below.... 2)  We refilled your meds for 2011... 3)  Today we did your follow up fasting blood work... please call the "phone tree" in a few  days for your lab results.Marland KitchenMarland Kitchen 4)  We also gave you the recommended vaccinations>> PNEUMOVAX (currently rec for one shot after age 72), and the Tdap tetanus booster (still rec for one shot every 36yrs)... 5)  Continue your diet + exercise program... 6)  Call for any problems.Marland KitchenMarland Kitchen 7)  Please schedule a follow-up appointment in 1 year, sooner as needed... Prescriptions: EFFEXOR XR 75 MG XR24H-CAP (VENLAFAXINE HCL) take 1 tab by mouth once daily... Brand medically necessary #90 x 4   Entered and Authorized by:   [name] MD   Signed by:   [name] MD on 08/26/2009   Method used:   Print then Give to Patient   RxID:   [id number] SYNTHROID 125 MCG  TABS (LEVOTHYROXINE SODIUM) Take 1 tablet by mouth once a day Brand medically necessary #90 x 4   Entered and Authorized by:   [name] MD   Signed by:   [name] MD on 08/26/2009   Method used:   Print then Give to Patient   RxID:   [id number] LIPITOR 80 MG  TABS (ATORVASTATIN CALCIUM) take one tablet by mouth at bedtime  #90 x 4   Entered and Authorized by:   [name] MD   Signed by:   [name] MD on 08/26/2009   Method used:   Print then Give to Patient   RxID:   [id number] LISINOPRIL-HYDROCHLOROTHIAZIDE 20-12.5 MG  TABS (LISINOPRIL-HYDROCHLOROTHIAZIDE) Take 1 tablet by mouth once a day  #90 x 4   Entered and Authorized by:   [name] MD   Signed by:   [name] MD on 08/26/2009   Method used:   Print then Give to Patient   RxID:   [id number] TOPROL XL 100 MG  TB24 (METOPROLOL SUCCINATE) Take 1 tablet by mouth once a day  #90 x 4   Entered and Authorized by:   [name] MD   Signed by:   [name] MD on 08/26/2009   Method used:   Print then Give to Patient   RxID:   [id number] PLAVIX 75 MG  TABS (CLOPIDOGREL BISULFATE) Take 1 tablet by mouth once a day  #90 x 4   Entered and Authorized by:   [name] MD   Signed by:   [name]  MD on 08/26/2009   Method used:    Print then Give to Patient   RxID:   [id number]    Immunizations Administered:  Pneumonia Vaccine:    Vaccine Type: Pneumovax    Site: left deltoid    Mfr: Merck    Dose: 0.5 ml    Route: IM    Given by: Randell Loop CMA    Exp. Date: 09/09/2010    Lot #: [id number]    VIS given: 03/11/96 version given August 26, 2009.  Tetanus Vaccine:    Vaccine Type: Tdap    Site: right deltoid    Mfr: boostrix    Dose: 0.5 ml    Route: IM    Given by: Randell Loop CMA    Exp. Date: 02/12/2011    Lot #: [id number]    VIS given: 07/02/07 version given August 26, 2009.

## 2010-09-13 NOTE — Progress Notes (Signed)
Summary: refill  Phone Note Call from Patient Call back at Home Phone [phone]   Caller: Patient Call For: nadel Reason for Call: Refill Medication Summary of Call: Requests refill on lisinopril/hctz 20-12.5mg .//[facility] Initial call taken by: Darletta Moll,  July 06, 2010 1:40 PM  Follow-up for Phone Call        refill of meds sent to the pharmacy Randell Loop CMA  July 06, 2010 1:44 PM     Prescriptions: LISINOPRIL-HYDROCHLOROTHIAZIDE 20-12.5 MG  TABS (LISINOPRIL-HYDROCHLOROTHIAZIDE) Take 1 tablet by mouth once a day  #30 Each x 7   Entered by:   Randell Loop CMA   Authorized by:   [name] MD   Signed by:   Randell Loop CMA on 07/06/2010   Method used:   Electronically to        [facility]  [phone]* (retail)       [address]       [place], [place]  [address]       Ph: [phone] or [phone]       Fax: [phone]   RxID:   [id number]

## 2010-09-13 NOTE — Miscellaneous (Signed)
Summary: change effexor to generic  received fax asking to change effexor to generic. Spoke with pt and she is ok with this because name brand is too expensive. Rx sent for generic. pt aware.Carron Curie CMA  Jan 03, 2010 4:43 PM Medications Added EFFEXOR XR 75 MG XR24H-CAP (VENLAFAXINE HCL) take 1 tab by mouth once daily...       Clinical Lists Changes  Medications: Changed medication from EFFEXOR XR 75 MG XR24H-CAP (VENLAFAXINE HCL) take 1 tab by mouth once daily... [BMN] to EFFEXOR XR 75 MG XR24H-CAP (VENLAFAXINE HCL) take 1 tab by mouth once daily... - Signed Rx of EFFEXOR XR 75 MG XR24H-CAP (VENLAFAXINE HCL) take 1 tab by mouth once daily...;  #30 x 3;  Signed;  Entered by: Carron Curie CMA;  Authorized by: [name] MD;  Method used: Electronically to [facility]  [phone]*, [address] [place], [place], [place]  [address], Ph: [phone] or [phone], Fax: [phone]    Prescriptions: EFFEXOR XR 75 MG XR24H-CAP (VENLAFAXINE HCL) take 1 tab by mouth once daily...  #30 x 3   Entered by:   Carron Curie CMA   Authorized by:   [name] MD   Signed by:   Carron Curie CMA on 01/03/2010   Method used:   Electronically to        [facility]  [phone]* (retail)       [address]       [place], [place]  [address]       Ph: [phone] or [phone]       Fax: [phone]   RxID:   [id number]

## 2010-09-15 NOTE — Assessment & Plan Note (Signed)
Summary: 12 month reminder/apc   Primary Care Provider:  Alroy Dust, MD  CC:  Yearly ROV & review of mult medical problems....  History of Present Illness: 70 y/o WF here for a follow up visit... she has multiple medical problems as noted below...    ~  August 26, 2009:  states she's had a good yr- no new complaints or concerns... BP controlled on meds & needs refill perscriptions today... 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 she understood DrD to say everything looked good, no changes, same Rx...  she also had f/u colonoscopy by DrDBrodie 11/10 w/ 2 polyps removed.   ~  August 29, 2010:  Yearly check up- doing well overall w/o new complaints or concerns... had URI/ sinus 11/11 Rx'd w/ Augmentin, Mucinex, Saline> resolved... no change in meds- tol well, etc... she notes wt down 5# w/ diet + exercise... BP controlled on meds;  no cerebral ischemic symptoms on the ASA/ Plavix;  Chol controlled on Lip80 + diet;  Thyroid sl over-replaced on the [id number] but she doesn't want to decr dose w/ energy good, wt down, etc;  she notes that DrCousins keeps up w/ her BMD & Rx w/ calcium, MVI;  she remains on Effexor75mg /d "it keeps me on an even keel";  still refuses Flu vaccine.    Current Problem List:  HYPERTENSION (ICD-401.9) - on TOPROL XL 100mg /d,  LISINOPRIL/ Hct 20/12.5 daily... BP= 120/74 today and she says even better readings at home... takes meds regularly & tol well... denies HA, fatigue, visual changes, CP, palipit, dizziness, syncope, dyspnea, edema, etc... she does water aerobics & walks for exercise...   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 she's been stable since that time w/ out pt f/u by IR, [name]> she indicates he said no further studies needed.  ~  CDopplers 5/04 showed mild plaque, no signif ICA stenoses...  ~  MR studies 10/09 showed sm vessel dis; poss restenosis left MCA w/ angiogram rec- but wasn't done; mod stenosis at origin of left vertebral art w/ tortuosity...  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  ~  1/12:  she remains asymptomatic- w/o cerebral ischemic symptoms...  HYPERCHOLESTEROLEMIA (ICD-272.0) - on LIPITOR 80mg /d (she stopped prev FishOil supplements).  ~  FLP 3/08 shows Tchol 180, TG 206, HDL 49, LDL 105... rec same meds, better diet, get wt down.  ~  FLP 10/09 showed TChol 150, TG 178, HDL 41, LDL 74  ~  FLP 1/11 on Lip80+Zetia10 showed TChol 162, TG 226, HDL 47, LDL 95... she wants to stop Zetia, get on diet & get wt down.  ~  FLP 1/12 on Lip80 showed TChol 173, TG 164, HDL 45, LDL 95  HYPOTHYROIDISM (ICD-244.9) - on SYNTHROID 162mcg/d...  ~  labs 3/08 showed TSH = 0.23  ~  labs 10/09 showed TSH= 0.64  ~  labs 1/11 showed TSH= 0.18... she wants to  keep same dose to aide wt reduction.  ~  labs 1/12 showed TSH= 0.20... ditto  COLONIC POLYPS (ICD-211.3), & Hx of HEMORRHOIDS (ICD-455.6)  ~  colonoscopy 10/00 by DrDBrodie showed hems only... f/u planned 80yrs.  ~  f/u colonoscopy 11/10 showed 2 polyps- one adenomatous w/ f/u planned 71yrs.  DEGENERATIVE JOINT DISEASE (ICD-715.90) - uses Tylenol & OTC meds as needed... in 2004 [facility] w/ soft tissue hemangioma found in right shoulder area... second opinion from DrWWard @  [facility] confirmed this- no surg necessary...  ~  1/11:  notes some pain in hands &  wrist...  ~  1/12:  stable.  FIBROMYALGIA (ICD-729.1)  OSTEOPOROSIS (ICD-733.00) - she indicates that this is followed & managed by GYN, DrCousins.Marland KitchenMarland Kitchen ?when last BMD was done "she keeps up w/ this" > on Calcium, & Vitamins...  ~  labs 1/11 showed Vit D level = 54...  TIA (ICD-435.9) - as above, she remains on ASA & Plavix... last saw DrReynolds in 2006...  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 arteriogram by Dr. Corliss Skains confirmed a web-like plaque in the left M1 segment of the left MCA with signif stenosis...  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 subseq PTA of left MCA w/ good result- resid 20% stenosis seen on f/u angiograms along w/ a 1mm saccular aneurysm seen in the right MCA trifurcation...  ANXIETY (ICD-300.00) - on EFFEXOR 75mg /d for hot flashes, she says.... she wishes to continue the med "it keeps me on an even keel".  Health Maintenance - GYN= DrCousins & she will call for f/u... Mammograms at [facility]... BMDs at [facility] & results sent to DrCousins...  ~  Immunizations: she refuses Flu vaccine... given PNEUMOVAX- 1/11, and Tdap- 1/11...   Preventive Screening-Counseling & Management  Alcohol-Tobacco     Smoking Status: quit     Year Quit: 1988  Allergies (verified): No Known Drug Allergies  Comments:  Nurse/Medical Assistant: The patient's medications and allergies were reviewed with the patient and were updated in the Medication and Allergy Lists.  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 mother deceased age 59--hx of lung cancer father deceased age 43, hx heart disease 2 Siblings: Sister alive age 67 Brother deceased age 59 from alcoholism & hx of colon cancer  Social History: Reviewed history from 07/12/2010 and no changes required. 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  The patient denies fever, chills, sweats, anorexia, fatigue, weakness, malaise, weight loss, sleep disorder, blurring, diplopia, eye irritation, eye discharge, vision loss, eye pain, photophobia, earache, ear discharge, tinnitus, decreased hearing, nasal congestion, nosebleeds, sore throat, hoarseness, chest pain, palpitations, syncope, orthopnea, PND, peripheral edema, cough, dyspnea at rest, excessive sputum, hemoptysis, wheezing, pleurisy, nausea, vomiting, diarrhea, constipation, change in bowel habits, abdominal pain, melena, hematochezia, jaundice, gas/bloating, indigestion/heartburn, dysphagia, odynophagia, dysuria, hematuria, urinary frequency, urinary hesitancy, nocturia, incontinence, joint swelling, muscle cramps, muscle weakness, stiffness, sciatica, restless legs, leg pain at night, leg pain with  exertion, rash, itching, dryness, suspicious lesions, paralysis, paresthesias, seizures, tremors, vertigo, transient blindness, frequent falls, frequent headaches, difficulty walking, depression, anxiety, memory loss, confusion, cold intolerance, heat intolerance, polydipsia, polyphagia, polyuria, unusual weight change, abnormal bruising, bleeding, enlarged lymph nodes, urticaria, allergic rash, hay fever, and recurrent infections.    Vital Signs:  Patient profile:   70 year old female Height:      69 inches Weight:      169.38 pounds BMI:     25.10 O2 Sat:      97 %  on Room air Temp:     97.8 degrees F oral Pulse rate:   66 / minute BP sitting:   120 / 74  (left arm) Cuff size:   regular  Vitals Entered By: Randell Loop CMA (August 29, 2010 2:03 PM)  O2 Sat at Rest %:  97 O2 Flow:  Room air CC: Yearly ROV & review of mult medical problems... Is Patient Diabetic? No Pain Assessment Patient in pain? no      Comments meds udpated today with pt   Physical Exam  Additional Exam:  WD, WN, 70 y/o WF in NAD... GENERAL:  Alert & oriented; pleasant & cooperative... HEENT:  [place]/AT, EOM-wnl, PERRLA, EACs-clear, TMs-wnl, NOSE-clear, THROAT-clear & wnl. NECK:  Supple w/ fairROM; no JVD; normal carotid impulses w/o bruits; no thyromegaly or nodules palpated; no lymphadenopathy. CHEST:  Clear to P & A; without wheezes/ rales/ or rhonchi. HEART:  Regular Rhythm; without murmurs/ rubs/ or gallops. ABDOMEN:  Soft & nontender; normal bowel sounds; no organomegaly or masses palpated. EXT: without deformities, mild arthritic changes; no varicose veins/ +venous insuffic/ no edema. NEURO:  CN's intact;  no focal neuro deficits... DERM:  No lesions noted; no rash etc...    MISC. Report  Procedure date:  08/31/2010  Findings:      Lipid Panel (LIPID)   Cholesterol               173 mg/dL                   1-610   Triglycerides        [H]  164.0 mg/dL                 9.6-045.4   HDL                       09.81 mg/dL                 >19.14   LDL Cholesterol           95 mg/dL                    7-82  Hepatic/Liver Function Panel (HEPATIC)   Total Bilirubin           0.9 mg/dL                   9.5-6.2   Direct Bilirubin          0.1 mg/dL                   1.3-0.8   Alkaline Phosphatase      54 U/L                      39-117   AST  26 U/L                      0-37   ALT                       32 U/L                      0-35   Total Protein             7.0 g/dL                    0.4-5.4   Albumin                   4.0 g/dL                     0.9-8.1  BMP (METABOL)   Sodium                    140 mEq/L                   135-145   Potassium                 4.6 mEq/L                   3.5-5.1   Chloride                  105 mEq/L                   96-112   Carbon Dioxide            29 mEq/L                    19-32   Glucose                   96 mg/dL                    19-14   BUN                       13 mg/dL                    7-82   Creatinine                0.6 mg/dL                   9.5-6.2   Calcium                   9.8 mg/dL                   1.3-08.6   GFR                       101.75 mL/min               >60.00  Comments:      CBC Platelet w/Diff (CBCD)   White Cell Count          5.3 K/uL                    4.5-10.5   Red Cell Count            4.54 Mil/uL  3.87-5.11   Hemoglobin                13.8 g/dL                   16.1-09.6   Hematocrit                39.6 %                      36.0-46.0   MCV                       87.3 fl                     78.0-100.0   Platelet Count            291.0 K/uL                  150.0-400.0   Neutrophil %              46.6 %                      43.0-77.0   Lymphocyte %              41.2 %                      12.0-46.0   Monocyte %                7.7 %                       3.0-12.0   Eosinophils%              3.8 %                       0.0-5.0   Basophils %               0.7 %                       0.0-3.0   TSH (TSH)   FastTSH              [L]  0.20 uIU/mL                 0.35-5.50   Impression & Recommendations:  Problem # 1:  HYPERTENSION (ICD-401.9) Controlled>  continue same meds. Her updated medication list for this problem includes:    Toprol Xl 100 Mg Tb24 (Metoprolol succinate) .Marland Kitchen... Take 1 tablet by mouth once a day    Lisinopril-hydrochlorothiazide 20-12.5 Mg Tabs (Lisinopril-hydrochlorothiazide) .Marland Kitchen... Take 1 tablet by mouth once a day  Problem # 2:  CEREBROVASCULAR DISEASE (ICD-437.9) Stable w/o cerebral ischemic symptoms>   continue ASA/ Plavix Rx...  Problem # 3:  HYPERCHOLESTEROLEMIA (ICD-272.0) Stable on Lip80 + diet rx... Her updated medication list for this problem includes:    Lipitor 80 Mg Tabs (Atorvastatin calcium) .Marland Kitchen... Take one tablet by mouth at bedtime  Problem # 4:  HYPOTHYROIDISM (ICD-244.9) TSH is sl oversuppressed but she wants to continue current dose to aide energy & wt reduction... Her updated medication list for this problem includes:    Synthroid 125 Mcg Tabs (Levothyroxine sodium) .Marland Kitchen... Take 1 tablet by mouth once a day  Problem # 5:  COLONIC POLYPS (ICD-211.3) Stable & up to date...  Problem #  6:  DEGENERATIVE JOINT DISEASE (ICD-715.90) Stable>  using OTC anti-inflamm meds Prn... Her updated medication list for this problem includes:    Bayer Aspirin 325 Mg Tabs (Aspirin) .Marland Kitchen... Take 1 tablet by mouth once a day  Problem # 7:  OSTEOPOROSIS (ICD-733.00) As noted> she indicates this is followed & managed by DrCousins, GYN...  Problem # 8:  OTHER MEDICAL ISSUES AS NOTED>>> She still refuses the Flu vaccinations...  Complete Medication List: 1)  Bayer Aspirin 325 Mg Tabs (Aspirin) .... Take 1 tablet by mouth once a day 2)  Plavix 75 Mg Tabs (Clopidogrel bisulfate) .... Take 1 tablet by mouth once a day 3)  Toprol Xl 100 Mg Tb24 (Metoprolol succinate) .... Take 1 tablet by mouth once a day 4)  Lisinopril-hydrochlorothiazide 20-12.5 Mg Tabs (Lisinopril-hydrochlorothiazide) .... Take 1 tablet by mouth once a day 5)  Lipitor 80 Mg Tabs (Atorvastatin calcium) .... Take one tablet by mouth at bedtime 6)  Synthroid 125 Mcg Tabs (Levothyroxine sodium) .... Take 1 tablet by mouth once a day 7)  Calcium 500 500 Mg Tabs (Calcium carbonate) .... Take one tablet by mouth two times a day 8)  Multivitamins Tabs (Multiple vitamin) .... Take 1 tablet by mouth once a day 9)  Effexor Xr 75 Mg Xr24h-cap (Venlafaxine hcl) .... Take 1 tab by mouth once daily...  Patient Instructions: 1)  Today we  updated your med list- see below.... 2)  Continue your current meds the same... 3)  Please return to our lab one morning this week for your FASTING blood work... then please call the "phone tree" in a few days for your lab results.Marland KitchenMarland Kitchen 4)  Keep up the good work w/ diet & exercise... 5)  Call for any problems.Marland KitchenMarland Kitchen

## 2010-09-21 ENCOUNTER — Telehealth (INDEPENDENT_AMBULATORY_CARE_PROVIDER_SITE_OTHER): Payer: Self-pay | Admitting: *Deleted

## 2010-09-29 NOTE — Progress Notes (Signed)
Summary: refill on plavix  Phone Note Call from Patient   Caller: Patient Call For: nadel Summary of Call: patient phoned she needs a refill on her Plavix she called the pharmacy they informed her that they sent a request last week but never received an approval. She uses [facility] [address] and she is completely out she missed last nights dose. She can  be reached at [phone]. She really needs this called in today. She just saw Dr Kriste Basque about two weeks ago and he asked about refills but she didnt rember this ont.  Initial call taken by: Vedia Coffer,  September 21, 2010 11:15 AM  Follow-up for Phone Call        Spoke with pt.  She states that she is needing refill for plavix- just had spoken with the pharmacist and was told that they never recieved our approval.  I called [facility] [address] and okayed rx for polavix #30 with 11 RF.  Pt aware.  Follow-up by: Vernie Murders,  September 21, 2010 11:39 AM    Prescriptions: PLAVIX 75 MG  TABS (CLOPIDOGREL BISULFATE) Take 1 tablet by mouth once a day  #30 x 11   Entered by:   Vernie Murders   Authorized by:   [name] MD   Signed by:   Vernie Murders on 09/21/2010   Method used:   Telephoned to ...       [facility]  [address]  [phone]* (retail)       [address]       [place], [place]  [address]       Ph: [phone] or [phone]       Fax: [phone]   RxID:   [id number]

## 2010-11-14 LAB — BASIC METABOLIC PANEL
BUN: 9 mg/dL (ref 6–23)
CO2: 24 mEq/L (ref 19–32)
Calcium: 9.1 mg/dL (ref 8.4–10.5)
Chloride: 104 mEq/L (ref 96–112)
GFR calc Af Amer: >60 mL/min (ref >60)
GFR calc non Af Amer: >60 mL/min (ref >60)
Potassium: 4 mEq/L (ref 3.5–5.1)
Sodium: 137 mEq/L (ref 135–145)

## 2010-11-14 LAB — PROTIME-INR
INR: 0.98 (ref 0.00–1.49)
Prothrombin Time: 12.9 seconds (ref 11.6–15.2)

## 2010-11-14 LAB — CBC
Hemoglobin: 13.6 g/dL (ref 12.0–15.0)
MCHC: 34.6 g/dL (ref 30.0–36.0)
MCV: 89.1 fL (ref 78.0–100.0)
RBC: 4.41 MIL/uL (ref 3.87–5.11)

## 2010-11-14 LAB — APTT: aPTT: 28 seconds (ref 24–37)

## 2010-12-30 NOTE — Consult Note (Signed)
NAME:  KEHINDE, TOTZKE NO.:  [id number]   MEDICAL RECORD NO.:  [id number]                   PATIENT TYPE:  INP   LOCATION:  3009                                 FACILITY:  [facility]   PHYSICIAN:  Casimiro Needle L. Thad Ranger, M.D.           DATE OF BIRTH:  Nov 02, 1940   DATE OF CONSULTATION:  01/08/2003  DATE OF DISCHARGE:                                   CONSULTATION   REASON FOR CONSULTATION:  Transient ischemic attacks.   HISTORY OF PRESENT ILLNESS:  This is the initial inpatient consultation  evaluation of this 70 year old right-handed woman referred for evaluation  of the above problem. The patient reports that she was in her usual state of  excellent health until Saturday, 5 days ago. She was on a rafting trip in  [place], and while on the trip she noted sudden onset of weakness on the  right side. She says that she was drifting off to the right when she walked  with a shuffling right leg. Her right arm felt weak, and her husband noted  that the right side of her face seemed to be drooping. This lasted 5 to 10  minutes and resolved and she did not seek medical attention at that time.  She says the episode was painless and was not associated with headache,  dizziness, shortness of breath, chest pain or palpitations.   She called her primary physician, Dr. Kriste Basque, the following Monday,  and saw  his nurse practitioner on Tuesday. At that time an outpatient workup was  arranged. However, on Wednesday morning, she had another episode. She was  putting cream on her face, and then said that her right hand simply became  useless.  This again lasted a few minutes and resolved, and this time she  was unaware of the face  or the leg being involved. It was again painless  and associated with no other symptoms. She called back, and that afternoon  was scheduled for an urgent MRI with MR angiography. This was  read out  tonight by Dr. Karin Golden as demonstrating a very  significant finding, and the  patient was subsequently admitted from home by Dr. Kriste Basque.   Presently she is having no further symptoms and no particular problems. She  has had no symptoms in the interval. She feels generally weak since the  episode on Saturday, but nothing focal, and she is not having any pain right  now.   PAST MEDICAL HISTORY:  Remarkable for hyperlipidemia which she says has been  difficult to control. She also was diagnosed with hypertension about a year  ago.   FAMILY HISTORY:  Negative for stroke or cerebrovascular disease.   SOCIAL HISTORY:  She denies has a remote history of tobacco and alcohol use,  but has not used in 17 years. She is normally independent in her activities  of daily living and very active.   ALLERGIES:  No known  drug allergies.   ADMISSION MEDICATIONS:  1. Aspirin every day.  2. Pravachol.  3. Toprol.  4. HCTZ.  5. Effexor.  6. Synthroid.  7. She reports that she was to be changed to Lipitor from Pravachol due to     poor control of her lipids.   REVIEW OF SYSTEMS:  Again negative for headache, neck pain, dizziness, chest  pain, palpitations, shortness of breath. A 10-point review of systems is  otherwise  negative.   PHYSICAL EXAMINATION:  VITAL SIGNS:  Temperature 98.3, blood pressure  154/82, pulse 88, respirations 20.  GENERAL:  This is a healthy appearing female in no evident distress.  HEENT:  Cranium is normocephalic and atraumatic. Oropharynx is benign.  NECK:  Supple without carotid bruits.  HEART:  Regular rate and rhythm without murmurs.  NEUROLOGIC:  Mental status, she is awake, alert and oriented to person,  place and time. Recent  and remote memory are intact. Attention span,  concentration and fund of knowledge are appropriate. Speech is fluent and  not dysarthric. There are no defects to confrontational naming. Mood is  euthymic  and affect appropriate. Cranial nerves:  Funduscopic examination  is benign. Pupils  were equal and briskly reactive. Extraocular movements  normal without nystagmus. Visual fields full to confrontation. Hearing is  intact and symmetric to finger rub. Facial sensation is intact to pinprick.  Face, tongue and palate all move normally and symmetrically. Shoulder shrug  strength  is normal. Motor testing:  Normal bulk and tone, normal strength  in all tested  muscles. Sensation intact to light touch and pinprick in all  extremities. Coordination:  Rapid alternating movements are normal. Finger-  to-nose and heel-to-shin are performed well. Gait, she arises easily and is  able to ambulate without difficulty. Reflexes 2+ and symmetric. Toes are  downgoing.   LABORATORY DATA:  An EKG performed tonight demonstrates left atrial  enlargement and incomplete right bundle branch block, but no other acute  abnormalities. An MRI of the brain performed at the [facility] is reviewed with Dr.  Corliss Skains  along with the accompanying MRA. The MRI demonstrates scattered  areas of small vessel white matter disease which seem particularly prominent  in the frontal parietal watershed area of the left MCA territory. There is  no acute stroke, however. The MRA demonstrates severe stenosis in the  proximal M1 segment of the left middle cerebral artery. Otherwise no  significant disease.   Urinalysis is negative. Sed rate 8. Coags are normal. CMET remarkable only  for a potassium of 3.3. CBC normal.   IMPRESSION:  1. Left brain transient ischemic attacks due to significant left middle     cerebral artery stenosis, critical by MR imaging.  2. Risk factors including hypercholesteremia and hypertension.   PLAN:  Will treat with heparin for now. Will proceed with angiography  tomorrow to define the lesion with ulceration, etc. At this point we will  have a discussion with the patient and Dr. Corliss Skains about possible  angioplasty of the lesion versus chronic  anticoagulation. The stroke service will  follow.   Thank you for the consult.                                                Michael L. Thad Ranger, M.D.    MLR/MEDQ  D:  01/08/2003  T:  01/09/2003  Job:  [id number]   cc:   Lonzo Cloud. Kriste Basque, M.D. [facility]

## 2010-12-30 NOTE — Discharge Summary (Signed)
NAME:  Debra Mcintyre, Debra Mcintyre NO.:  [id number]   MEDICAL RECORD NO.:  [id number]                   PATIENT TYPE:  INP   LOCATION:  3009                                 FACILITY:  [facility]   PHYSICIAN:  Lonzo Cloud. Kriste Basque, M.D. [facility]            DATE OF BIRTH:  07/18/1941   DATE OF ADMISSION:  01/08/2003  DATE OF DISCHARGE:  01/10/2003                                 DISCHARGE SUMMARY   FINAL DIAGNOSES:  1. 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  Cerebral     arteriogram performed 5/28 by Dr. Corliss Skains, confirming a web-like plaque     in the left M1 segment of the left middle cerebral artery with     significant stenosis.  She also had an aberrant right subclavian artery,     which was a normal developmental variation.  Plan is for an angioplasty     to this middle cerebral artery lesion.  2. History of hypertension - controlled on medications.  3. History of hypercholesterolemia - medications adjusted this admission.  4. History of hypothyroidism - controlled on Synthroid.  5. History of degenerative arthritis, osteopenia and mild fibromyalgia.  She     had a recent evaluation by Dr. 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 A second opinion from Dr. Chrissie Noa     Ward at [facility] confirmed this, and no surgery is planned.  6. History of anxiety.  She is currently taking Effexor 75 mg p.o. daily     from her gynecologist, Dr. Cherly Hensen, because of hot flashes.  7. History of hemorrhoids, negative colonoscopy in 2000, Dr. [name].   BRIEF HISTORY AND PHYSICAL:  The patient is a 70 year old white female who  presented with 2 recent TIA's and an outpatient MRA that showed a tight  stenosis in the left middle cerebral artery.  There was no previous history  of arteriosclerotic peripheral vascular disease or cerebrovascular problems,  or  arteriosclerotic heart disease, etc.  The patient had last been seen for  routine check in 8/03.  About 5 days prior to admission, she developed  symptoms of a TIA with left facial weakness, slight slurring of speech and  felt off-balance, and walking sideways with some associated numbness on  the right side.  The episode lasted about 5 or 10 minutes and resolved over  several minutes, back to normal.   She was seen in the office by the nurse practitioner with a normal  neurologic exam and was referred to neurology for further evaluation. Before  this could be accomplished, she had a second episode, this time involving  right hand weakness and incoordination. It also lasted about 5 minutes and  resolved spontaneously. She had been taking aspirin 81 mg daily and Plavix  was added to the regimen. An outpatient MRI/MRA was ordered.  The  radiologist called on the evening of admission, indicating that the MRI  showed no evidence of any acute stroke, but that the MRA showed a tight  stenosis of the left middle cerebral artery and that she needed  hospitalization for IV heparin and neurologic consultation.   PAST MEDICAL HISTORY:  Hypertension controlled on Toprol and  hydrochlorothiazide.  Blood pressure has been under good control with this.  Hypercholesterolemia, for which she has taken Pravachol 40 mg p.o. q.h.s.  and diet therapy. Her cholesterol has ranged between 212-260 over the last  year, and a change in medications is indicated. Hypothyroidism that is  controlled on Synthroid.  Degenerative arthritis and osteopenia with mild  fibromyalgia symptoms.  She has been taking multivitamins and calcium. She  had a recent evaluation, Dr. Madelon Lips who found a soft tissue hemangioma in  the right shoulder area.  A second opinion by Dr. Chrissie Noa Ward at [facility]  indicated no surgery possible for this lesion.  Anxiety and currently is  taking Effexor 75 mg p.o. daily from her gynecologist, Dr.  Cherly Hensen because  of hot flashes. She has history of alcohol in the past.   She had a previous cholecystectomy in 2000 and was negative except for a few  hemorrhoids.   ADMISSION PHYSICAL EXAMINATION:  VITAL SIGNS:  Blood pressure 140/80, pulse  66 per minute and regular, respirations 20 per minute and not labored,  temperature 98.3, O2 saturation 98% on room air.  GENERAL:  A 70 year old white female in no acute distress.  HEENT:  Unremarkable.  NECK:  No jugulovenous distention, no carotid bruits heard, no  lymphadenopathy or thyromegaly.  LUNGS:  Clear to auscultation and percussion.  CARDIOVASCULAR:  Regular rhythm, normal S1 and S2 without murmurs, rubs or  gallops detected.  ABDOMEN:  Soft, nontender without evidence of organomegaly or masses.  EXTREMITIES:  No clubbing, cyanosis or edema.  NEUROLOGIC:  Intact without any focal abnormalities detected.   LABORATORY DATA:  EKG showed normal sinus rhythm, slight interventricular  conduction delay, otherwise normal cardiogram and no acute changes. Chest x-  ray showed normal heart size, slight scoliosis, mild prominence of the  interstitial markings but essentially clear with no acute cardiopulmonary  process.  Previous MRI showed no evidence of any acute stroke and a tight  stenosis in the left middle cerebral artery on MRA.  Four vessel arteriogram  performed on 01/09/03 by Dr. Corliss Skains showed a web-like plaque in the left  middle cerebral artery M1 segment proximally with significant stenosis.  There was also an aberrant right subclavian artery which was a normal  developmental variation.   Hemoglobin 13.4, hematocrit 39.2, white count 10,100 with normal  differential.  Sedimentation rate 8.  Pro time 12.7, INR 0.9, PTT 29  seconds.  Sodium 136, potassium 3.3, chloride 101, CO2 26, BUN 13,  creatinine 0.7, blood sugar 109, calcium 9.6, total protein 7.2, albumin  3.8, AST 29, ALT 24, alkaline phosphatase 56, total bilirubin  0.7. Homocysteine level 6.71 (normal 5-13.9).  TSH 0.83 indicating some over-  suppression on her dose of Synthroid.  Urinalysis clear.   HOSPITAL COURSE:  The patient was admitted with tight stenosis of the left  middle cerebral artery resulting in 2 TIA's.  She was started on heparin by  pharmacy protocol, and consultation was obtained with Drs. Reynolds and  [employer] of the neurology service and Dr. Corliss Skains of interventional  radiology service.  All were in agreement that she needed an arteriogram,  which was performed by Dr. Corliss Skains on 01/09/03 with results as above,  indicating a web-like plaque at the left middle cerebral artery M1 segment  proximally with significant stenosis.   A long discussion after procedure indicated that angioplasty was the best  approach to this abnormality.  The patient is to see Dr. Corliss Skains in 2 days  to arrange outpatient procedure.  Dr. Anne Hahn felt that aspirin and Plavix  was the best possible treatment for this lesion at the present time, so she  is being discharged on this medication.   DISCHARGE MEDICATIONS:  1. Aspirin 325 mg coated, 1 daily.  2. Plavix 75 mg p.o. daily.  3. Lipitor 40 mg p.o. daily.  4. Synthroid 0.125 mg p.o. daily.  5. Effexor 75 mg p.o. daily.  6. Toprol  XL 100 one tablet p.o. daily.  7. Hydrochlorothiazide 12.5 mg p.o. daily.  8. Multivitamins, etc.  9. Take Tylenol as needed for pain.   DIET:  She was instructed on a low salt, low fat diet.   DISPOSITION:  The patient was discharged on aspirin and Plavix. She will  rest at home over the weekend and will be seeing Dr. Corliss Skains on Monday  5/31 for consideration of arthroplasty.   CONDITION ON DISCHARGE:  Stable.                                               Lonzo Cloud. Kriste Basque, M.D. [facility]    SMN/MEDQ  D:  01/10/2003  T:  01/10/2003  Job:  [id number]   cc:   Marlan Palau, M.D.  [address]  [address]  [place]  [place] [address]  Fax: [phone]   Grandville Silos.  Corliss Skains, M.D.  [address] [place]  [place]  [place]  [address]  Fax: [phone]

## 2011-03-02 ENCOUNTER — Other Ambulatory Visit: Payer: Self-pay | Admitting: Pulmonary Disease

## 2011-03-06 ENCOUNTER — Telehealth: Payer: Self-pay | Admitting: Pulmonary Disease

## 2011-03-06 MED ORDER — LISINOPRIL-HYDROCHLOROTHIAZIDE 20-12.5 MG PO TABS: 1.0000 | ORAL_TABLET | Freq: Every day | ORAL | Status: DC

## 2011-03-06 NOTE — Telephone Encounter (Signed)
Spoke with pt to verify msg. Rx for lisinopril hct sent to pharm and nothing further needed.

## 2011-04-03 ENCOUNTER — Ambulatory Visit (INDEPENDENT_AMBULATORY_CARE_PROVIDER_SITE_OTHER): Payer: Medicare Other | Admitting: Adult Health

## 2011-04-03 ENCOUNTER — Ambulatory Visit (INDEPENDENT_AMBULATORY_CARE_PROVIDER_SITE_OTHER)
Admission: RE | Admit: 2011-04-03 | Discharge: 2011-04-03 | Disposition: A | Discharge status: Home / Self Care | Payer: Medicare Other | Source: Ambulatory Visit | Attending: Adult Health | Admitting: Adult Health

## 2011-04-03 ENCOUNTER — Encounter: Payer: Self-pay | Admitting: *Deleted

## 2011-04-03 VITALS — BP 148/63 | HR 103 | Temp 98.7°F | Ht 67.5 in | Wt 166.4 lb

## 2011-04-03 DIAGNOSIS — J40 Bronchitis, not specified as acute or chronic: Secondary | ICD-10-CM

## 2011-04-03 MED ORDER — ALBUTEROL SULFATE (2.5 MG/3ML) 0.083% IN NEBU
2.5000 mg | INHALATION_SOLUTION | Freq: Once | RESPIRATORY_TRACT | Status: AC
  Administered 2011-04-03: 2.5 mg via RESPIRATORY_TRACT

## 2011-04-03 MED ORDER — AMOXICILLIN-POT CLAVULANATE 875-125 MG PO TABS: 1.0000 | ORAL_TABLET | Freq: Two times a day (BID) | ORAL | Status: DC

## 2011-04-03 MED ORDER — HYDROCODONE-HOMATROPINE 5-1.5 MG/5ML PO SYRP: 5.0000 mL | ORAL_SOLUTION | Freq: Four times a day (QID) | ORAL | Status: AC | PRN

## 2011-04-03 MED ORDER — PREDNISONE 10 MG PO TABS: ORAL_TABLET | ORAL | Status: AC

## 2011-04-03 NOTE — Patient Instructions (Signed)
Augmentin 875mg  Twice daily  For 7 days with food  Mucinex DM Twice daily  As needed  Cough/congestion  Fluids and rest  Hydromet 1-2 tsp every 6 hr As needed  Cough-may make you sleepy  Prednisone taper over next week.  Please contact office for sooner follow up if symptoms do not improve or worsen or seek emergency care  I will call with xray results.

## 2011-04-03 NOTE — Assessment & Plan Note (Addendum)
Acute bronchitis -will check xray today  Albuterol neb in office Will watch closely if cough not resolving or recurrent flare will need to change off ACE  Plan:  Augmentin 875mg  Twice daily  For 7 days with food  Mucinex DM Twice daily  As needed  Cough/congestion  Fluids and rest  Hydromet 1-2 tsp every 6 hr As needed  Cough-may make you sleepy  Prednisone taper over next week.  Please contact office for sooner follow up if symptoms do not improve or worsen or seek emergency care  I will call with xray results.

## 2011-04-03 NOTE — Progress Notes (Signed)
  Subjective:    Patient ID: Debra Mcintyre, female    DOB: 1941-02-26, 70 y.o.   MRN: [id number]  HPI 71 yo WF with known hx of HTN, PVD, DJD and Hyperlipidemia   04/03/2011 Acute OV  Pt presents for an acute office visit. Complains of 1 week of cough , congestion and nasal drainage. Has Used multiple OTC meds without help. Cough is driving her crazy. She says had some nasal symptoms,  Drainage on/off for 4 weeks with cough starting last week. No recent abx or travel.  OF note she is on a ACE Inhibitor that has been well controlled on .    Review of Systems Constitutional:   No  weight loss, night sweats,  Fevers, chills, + fatigue, or  lassitude.  HEENT:   No headaches,  Difficulty swallowing,  Tooth/dental problems, or  Sore throat,                + sneezing, itching, ear ache, nasal congestion, post nasal drip,   CV:  No chest pain,  Orthopnea, PND, swelling in lower extremities, anasarca, dizziness, palpitations, syncope.   GI  No heartburn, indigestion, abdominal pain, nausea, vomiting, diarrhea, change in bowel habits, loss of appetite, bloody stools.   Resp:   No coughing up of blood.     No chest wall deformity  Skin: no rash or lesions.  GU: no dysuria, change in color of urine, no urgency or frequency.  No flank pain, no hematuria   MS:  No joint pain or swelling.  No decreased range of motion.  No back pain.  Psych:  No change in mood or affect. No depression or anxiety.  No memory loss.         Objective:   Physical Exam GEN: A/Ox3; pleasant , NAD, well nourished   HEENT:  [place]/AT,  EACs-clear, TMs-wnl, NOSE-clear, THROAT-clear drainage , no lesions, no postnasal drip or exudate noted.   NECK:  Supple w/ fair ROM; no JVD; normal carotid impulses w/o bruits; no thyromegaly or nodules palpated; no lymphadenopathy.  RESP  Coarse BS w/ exp wheezing no accessory muscle use, no dullness to percussion  CARD:  RRR, no m/r/g  , no peripheral edema, pulses intact, no  cyanosis or clubbing.  GI:   Soft & nt; nml bowel sounds; no organomegaly or masses detected.  Musco: Warm bil, no deformities or joint swelling noted.   Neuro: alert, no focal deficits noted.    Skin: Warm, no lesions or rashes        Assessment & Plan:

## 2011-04-03 NOTE — Progress Notes (Signed)
Addended by: Boone Master E on: 04/03/2011 06:16 PM   Modules accepted: Orders

## 2011-04-05 ENCOUNTER — Encounter: Payer: Self-pay | Admitting: Pulmonary Disease

## 2011-04-10 ENCOUNTER — Telehealth: Payer: Self-pay | Admitting: Pulmonary Disease

## 2011-04-10 MED ORDER — AMOXICILLIN-POT CLAVULANATE 875-125 MG PO TABS: 1.0000 | ORAL_TABLET | Freq: Two times a day (BID) | ORAL | Status: AC

## 2011-04-10 NOTE — Telephone Encounter (Signed)
Recommend to have Augmentin 875mg  Twice daily  For additional 3 days, start today.  She is on ACE inhibitor. That can make cough worse  Will need ov if not improving to discuss change in meds Please contact office for sooner follow up if symptoms do not improve or worsen or seek emergency care

## 2011-04-10 NOTE — Telephone Encounter (Signed)
SPoke with pt and is aware to continue augmentin for an additional 3 days. Pt is scheduled to see SN at 8/7 at 12:00 to discuss changing meds.

## 2011-04-10 NOTE — Telephone Encounter (Signed)
Spoke with the pt and she saw TP on 04-03-11 ans was given augmentin and pred taper. She states she felt some better while on the medication. She finished meds yesterday. She states over the last 2 days she has been having increased SOB, and dry cough. She states she was up from 2am to 5 am coughing. She also c/o increased weakness as well. Please advise. Carron Curie, CMA No Known Allergies

## 2011-04-21 ENCOUNTER — Encounter: Payer: Self-pay | Admitting: Pulmonary Disease

## 2011-04-21 ENCOUNTER — Ambulatory Visit (INDEPENDENT_AMBULATORY_CARE_PROVIDER_SITE_OTHER): Payer: Medicare Other | Admitting: Pulmonary Disease

## 2011-04-21 VITALS — BP 114/68 | HR 84 | Temp 98.4°F | Ht 67.5 in | Wt 166.0 lb

## 2011-04-21 DIAGNOSIS — I671 Cerebral aneurysm, nonruptured: Secondary | ICD-10-CM

## 2011-04-21 DIAGNOSIS — R0600 Dyspnea, unspecified: Secondary | ICD-10-CM

## 2011-04-21 DIAGNOSIS — E039 Hypothyroidism, unspecified: Secondary | ICD-10-CM

## 2011-04-21 DIAGNOSIS — I679 Cerebrovascular disease, unspecified: Secondary | ICD-10-CM

## 2011-04-21 DIAGNOSIS — R0609 Other forms of dyspnea: Secondary | ICD-10-CM

## 2011-04-21 DIAGNOSIS — G459 Transient cerebral ischemic attack, unspecified: Secondary | ICD-10-CM

## 2011-04-21 DIAGNOSIS — I10 Essential (primary) hypertension: Secondary | ICD-10-CM

## 2011-04-21 DIAGNOSIS — M1990 Unspecified osteoarthritis, unspecified site: Secondary | ICD-10-CM

## 2011-04-21 DIAGNOSIS — F411 Generalized anxiety disorder: Secondary | ICD-10-CM

## 2011-04-21 DIAGNOSIS — J45909 Unspecified asthma, uncomplicated: Secondary | ICD-10-CM | POA: Insufficient documentation

## 2011-04-21 DIAGNOSIS — E7800 Pure hypercholesterolemia, unspecified: Secondary | ICD-10-CM

## 2011-04-21 MED ORDER — METHYLPREDNISOLONE ACETATE 80 MG/ML IJ SUSP
120.0000 mg | Freq: Once | INTRAMUSCULAR | Status: AC
  Administered 2011-04-21: 120 mg via INTRA_ARTICULAR

## 2011-04-21 MED ORDER — PREDNISONE 20 MG PO TABS: ORAL_TABLET | ORAL | Status: DC

## 2011-04-21 MED ORDER — HYDROCOD POLST-CHLORPHEN POLST 10-8 MG/5ML PO LQCR: 5.0000 mL | Freq: Two times a day (BID) | ORAL | Status: DC

## 2011-04-21 NOTE — Progress Notes (Signed)
Subjective:    Patient ID: Debra Mcintyre, female    DOB: [birth year], 70 y.o.   MRN: [id number]  HPI 70 y/o WF here for a follow up visit... she has multiple medical problems as noted below...   ~  August 26, 2009:  states she's had a good yr- no new complaints or concerns... BP controlled on meds & needs refill perscriptions today... 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 she understood DrD to say everything looked good, no changes, same Rx...  she also had f/u colonoscopy by DrDBrodie 11/10 w/ 2 polyps removed.  ~  August 29, 2010:  Yearly check up- doing well overall w/o new complaints or concerns... had URI/ sinus 11/11 Rx'd w/ Augmentin, Mucinex, Saline> resolved... no change in meds- tol well, etc... she notes wt down 5# w/ diet + exercise... BP controlled on meds;  no cerebral ischemic symptoms on the ASA/ Plavix;  Chol controlled on Lip80 + diet;  Thyroid sl over-replaced on the [id number] but she doesn't want to decr dose w/ energy good, wt down, etc;  she notes that DrCousins keeps up w/ her BMD & Rx w/ calcium, MVI;  she remains on Effexor75mg /d "it keeps me on an even keel";  still refuses Flu vaccine.  ~  April 21, 2011:  Add-on appt for refractory asthmatic bronchitis>    She notes >85mo hx cough, congestion, thick beige sputum, and assoc SOB/ wheezing/ etc;  Seen by TP w/ bronchitis 8/20 & treated w/ Augmentin, Pred taper, Mucinex, Hydromet;  Improved but not resolved & recently experienced incr dyspnea, wheezing, chest congestion;  Denies f/c/s, no hemoptysis, min CP just from the coughing, not resting well...    Other medical issues appear stable> BP controlled on Metoprolol & Lisinopril/HCT;  Cerebrovasc dis  stable on ASA/ Plavix;  Lipids controlled on diet + Lip80;  Thyroid remains regulated & clinically euthyroid on 120mcg/d;  Anxiety controlled on Effexor...    CXR 04/03/11 reviewed> Clear, NAD;  PFT today showed FVC=2.75 (82%), FEV1=2.15 (84%), %1sec=78, Mid-flows=90%pred...    We decided to treat w/ DepoMedrol, PREDNISONE 4d-tapering sched, ADVAIR 250 Bid sampler; MUCINEX 2Bid, Fluids, & TUSSIONEX; rov 2-3 wks.          Problem List:  HYPERTENSION (ICD-401.9) - on TOPROL XL 100mg /d,  LISINOPRIL/ Hct 20/12.5 daily... BP= 114/68 today and she says similar readings at home... takes meds regularly & tol well... denies HA, fatigue, visual changes, CP, palipit, dizziness, syncope, dyspnea, edema, etc... she does water aerobics & walks for exercise...   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 hx of left middle cerebral art stenosis w/ TIA in 2004- hosp w/ cerebral angiogram and PTA by DrDeveshwar; incidental 1-39mm right MCA aneurysm noted... she's been stable since that time w/ out pt f/u by IR, [name]> she indicates he said no further studies needed. ~  CDopplers 5/04 showed mild plaque, no signif ICA stenoses... ~  MR studies 10/09 showed sm vessel dis; poss restenosis left MCA w/ angiogram rec- but wasn't done; mod stenosis at origin of left vertebral art w/ tortuosity... ~  Carotid arteriogram 12/10 showed stable mild residual left middle cerebral art stenosis at site of prev angioplasty, and stable  1.5 to 2mm saccular right middle cerebral art aneurysm... ~  1/12:  she remains asymptomatic- w/o cerebral ischemic symptoms...  HYPERCHOLESTEROLEMIA (ICD-272.0) - on LIPITOR 80mg /d (she stopped prev FishOil supplements). ~  FLP 3/08 shows Tchol 180, TG 206, HDL 49, LDL 105... rec same meds, better diet, get wt down. ~  FLP 10/09 showed TChol 150, TG 178, HDL 41, LDL 74 ~  FLP 1/11 on Lip80+Zetia10 showed TChol 162, TG 226, HDL 47,  LDL 95... she wants to stop Zetia, get on diet & get wt down. ~  FLP 1/12 on Lip80 showed TChol 173, TG 164, HDL 45, LDL 95  HYPOTHYROIDISM (ICD-244.9) - on SYNTHROID 133mcg/d... ~  labs 3/08 showed TSH = 0.23 ~  labs 10/09 showed TSH= 0.64 ~  labs 1/11 showed TSH= 0.18... she wants to keep same dose to aide wt reduction. ~  labs 1/12 showed TSH= 0.20... ditto  COLONIC POLYPS (ICD-211.3), & Hx of HEMORRHOIDS (ICD-455.6) ~  colonoscopy 10/00 by DrDBrodie showed hems only... f/u planned 66yrs. ~  f/u colonoscopy 11/10 showed 2 polyps- one adenomatous w/ f/u planned 79yrs.  DEGENERATIVE JOINT DISEASE (ICD-715.90) - uses Tylenol & OTC meds as needed... in 2004 seen by [facility] w/ soft tissue hemangioma found in right shoulder area... second opinion from DrWWard @  [facility] confirmed this- no surg necessary... ~  1/11:  notes some pain in hands & wrist... ~  1/12:  Stable w/o acute complaints or problem areas...  FIBROMYALGIA (ICD-729.1)  OSTEOPOROSIS (ICD-733.00) - she indicates that this is followed & managed by GYN, DrCousins.Marland KitchenMarland Kitchen ?when last BMD was done "she keeps up w/ this" > on Calcium, & Vitamins... ~  labs 1/11 showed Vit D level = 54...  TIA (ICD-435.9) - as above, she remains on ASA & Plavix... last saw DrReynolds in 2006... 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 arteriogram by Dr. Corliss Skains confirmed a web-like plaque in the left M1 segment of the left MCA with signif stenosis...  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 subseq PTA of left MCA w/ good result- resid 20% stenosis seen on f/u angiograms along w/ a 1mm saccular aneurysm seen in the right MCA trifurcation...  ANXIETY (ICD-300.00) - on EFFEXOR 75mg /d for hot flashes, she says.... she wishes to continue the med "it keeps me on an even keel".  Health Maintenance - GYN= DrCousins & she will call for f/u... Mammograms at  [facility]... BMD[facility] at [facility] & results sent to DrCousins... ~  Immunizations: she refuses Flu vaccine... given PNEUMOVAX- 1/11, and Tdap- 1/11...   Past Surgical History  Procedure Date  . Left middle cerebral artery angioplasty 2004    by [facility]    Outpatient Encounter Prescriptions as of 04/21/2011  Medication Sig Dispense Refill  . aspirin 325 MG tablet Take 325 mg by mouth daily.        Marland Kitchen atorvastatin (LIPITOR) 80 MG tablet Take 80 mg by mouth daily.        . Calcium Carbonate-Vitamin D (CALCIUM 600+D) 600-200 MG-UNIT TABS Take 1 tablet by mouth 2 (two) times daily.        . clopidogrel (PLAVIX) 75 MG tablet Take 75 mg by mouth daily.        Marland Kitchen levothyroxine (SYNTHROID, LEVOTHROID) 125 MCG tablet Take 125 mcg by mouth daily.        Marland Kitchen lisinopril-hydrochlorothiazide (PRINZIDE) 20-12.5 MG per tablet Take 1 tablet  by mouth daily.  30 tablet  11  . metoprolol (TOPROL-XL) 100 MG 24 hr tablet Take 100 mg by mouth daily.        . Multiple Vitamin (MULTIVITAMIN) capsule Take 1 capsule by mouth daily.        . Omega-3 Fatty Acids (FISH OIL) 1200 MG CAPS Take 1 capsule by mouth 2 (two) times daily.        Marland Kitchen venlafaxine (EFFEXOR-XR) 75 MG 24 hr capsule Take 75 mg by mouth daily.        . chlorpheniramine-HYDROcodone (TUSSIONEX PENNKINETIC ER) 10-8 MG/5ML LQCR Take 5 mLs by mouth every 12 (twelve) hours.  120 mL  1  . predniSONE (DELTASONE) 20 MG tablet Take 1 tab bid x 4 days, 1 tab daily x 4 days, 1/2 tab daily until return ov  20 tablet  0   Facility-Administered Encounter Medications as of 04/21/2011  Medication Dose Route Frequency Provider Last Rate Last Dose  . methylPREDNISolone acetate (DEPO-MEDROL) injection 120 mg  120 mg Intra-articular Once [name], MD   120 mg at 04/21/11 1255    No Known Allergies   Current Medications, Allergies, Past Medical History, Past Surgical History, Family History, and Social History were reviewed in [employer] electronic medical record.      Review of Systems        The patient complains of dyspnea on exertion, back pain, joint pain, and arthritis.  The patient denies fever, chills, sweats, anorexia, fatigue, weakness, malaise, weight loss, sleep disorder, blurring, diplopia, eye irritation, eye discharge, vision loss, eye pain, photophobia, earache, ear discharge, tinnitus, decreased hearing, nasal congestion, nosebleeds, sore throat, hoarseness, chest pain, palpitations, syncope, orthopnea, PND, peripheral edema, cough, dyspnea at rest, excessive sputum, hemoptysis, wheezing, pleurisy, nausea, vomiting, diarrhea, constipation, change in bowel habits, abdominal pain, melena, hematochezia, jaundice, gas/bloating, indigestion/heartburn, dysphagia, odynophagia, dysuria, hematuria, urinary frequency, urinary hesitancy, nocturia, incontinence, joint swelling, muscle cramps, muscle weakness, stiffness, sciatica, restless legs, leg pain at night, leg pain with exertion, rash, itching, dryness, suspicious lesions, paralysis, paresthesias, seizures, tremors, vertigo, transient blindness, frequent falls, frequent headaches, difficulty walking, depression, anxiety, memory loss, confusion, cold intolerance, heat intolerance, polydipsia, polyphagia, polyuria, unusual weight change, abnormal bruising, bleeding, enlarged lymph nodes, urticaria, allergic rash, hay fever, and recurrent infections.     Objective:   Physical Exam      WD, WN, 70 y/o WF in NAD... GENERAL:  Alert & oriented; pleasant & cooperative... HEENT:  [place]/AT, EOM-wnl, PERRLA, EACs-clear, TMs-wnl, NOSE-clear, THROAT-clear & wnl. NECK:  Supple w/ fairROM; no JVD; normal carotid impulses w/o bruits; no thyromegaly or nodules palpated; no lymphadenopathy. CHEST:  Bibasilar wheezing & rhonchi w/ congested cough but no rales or signs of consolidation... HEART:  Regular Rhythm; without murmurs/ rubs/ or gallops. ABDOMEN:  Soft & nontender; normal bowel sounds; no organomegaly or masses  palpated. EXT: without deformities, mild arthritic changes; no varicose veins/ +venous insuffic/ no edema. NEURO:  CN's intact;  no focal neuro deficits... DERM:  No lesions noted; no rash etc...   Assessment & Plan:   ASTHMATIC BRONCHITIS>  She did not respond to the prev Augmentin/ Pred/ Mucinex regimen with persistant symptoms;  REC> Depo120, Pred 20mg - 4d-tapering sched, ADVAIR 250Bid (samples), MUCINEX 2Bid w/ Fluids, & TUSSIONEX prn... We plan ROV in 2-3 weeks.  HBP>  Controlled on BBlocker, & ACE/diuretic, continue same...  Cerebrovasc Dis>  She had TIA 2004 w/ eval revealing left MCA stenosis; DrDeveshwar did PTA w/ good result; incidental 1-50mm aneurysm  noted in the right MCA; she has remained asymptomatic since 2004...  CHOL>  Stable on diet + Lip80...  HPYOTHYROID>  On Synthroid 143mcg/d w/ TSH sl low but she likes it that was & does not want to decr the dose...  Other medical problems as noted.Marland KitchenMarland Kitchen

## 2011-04-21 NOTE — Patient Instructions (Signed)
Today we updated your med list in EPIC...  For the cough & dyspnea:    We gave you a Depo shot & started a more substantial anti-inflammatory regimen w/ PREDNISONE 20mg >       Take 1 tab twice daily for 4d, then 1 daily for 4d, then 1/2 tab daily for til return...    Use the ADVAIR inhaler- one inhalation twice daily as directed...    Take the [facility] 2 tabs twice daily on a regular basis w/ lots of fluids...    You may use the TUSSIONEX 1 tsp every 12H as needed for severe cough...  This combination should reduce the inflammation in your bronchial tubes, AND reduce the congestion & thick phlegm plugging your airways...    This will, in turn, improve the cough & the shortness of breath...  Let's plan a follow up visit in 2-3 weeks.Marland KitchenMarland Kitchen

## 2011-04-29 ENCOUNTER — Other Ambulatory Visit: Payer: Self-pay | Admitting: Pulmonary Disease

## 2011-05-01 ENCOUNTER — Other Ambulatory Visit: Payer: Self-pay | Admitting: Pulmonary Disease

## 2011-05-02 ENCOUNTER — Other Ambulatory Visit: Payer: Self-pay | Admitting: *Deleted

## 2011-05-02 MED ORDER — ATORVASTATIN CALCIUM 80 MG PO TABS: 80.0000 mg | ORAL_TABLET | Freq: Every day | ORAL | Status: DC

## 2011-05-02 MED ORDER — LEVOTHYROXINE SODIUM 125 MCG PO TABS: 125.0000 ug | ORAL_TABLET | Freq: Every day | ORAL | Status: DC

## 2011-05-02 MED ORDER — METOPROLOL SUCCINATE ER 100 MG PO TB24: 100.0000 mg | ORAL_TABLET | Freq: Every day | ORAL | Status: DC

## 2011-05-15 LAB — CREATININE, SERUM
Creatinine, Ser: 0.68
GFR calc non Af Amer: >60

## 2011-05-15 LAB — BUN: BUN: 9

## 2011-05-16 ENCOUNTER — Ambulatory Visit: Payer: Medicare Other | Admitting: Pulmonary Disease

## 2011-05-16 LAB — URINALYSIS, ROUTINE W REFLEX MICROSCOPIC
Glucose, UA: NEGATIVE
Hgb urine dipstick: NEGATIVE
Protein, ur: NEGATIVE
pH: 7

## 2011-05-16 LAB — URINE CULTURE

## 2011-05-16 LAB — POCT I-STAT, CHEM 8
BUN: 18
Calcium, Ion: 1.19
Chloride: 100
Creatinine, Ser: 1
Glucose, Bld: 130 — ABNORMAL HIGH
Potassium: 3.5

## 2011-05-16 LAB — URINE MICROSCOPIC-ADD ON

## 2011-06-12 ENCOUNTER — Ambulatory Visit (INDEPENDENT_AMBULATORY_CARE_PROVIDER_SITE_OTHER): Payer: Medicare Other | Admitting: Pulmonary Disease

## 2011-06-12 ENCOUNTER — Encounter: Payer: Self-pay | Admitting: Pulmonary Disease

## 2011-06-12 DIAGNOSIS — F411 Generalized anxiety disorder: Secondary | ICD-10-CM

## 2011-06-12 DIAGNOSIS — I679 Cerebrovascular disease, unspecified: Secondary | ICD-10-CM

## 2011-06-12 DIAGNOSIS — J45909 Unspecified asthma, uncomplicated: Secondary | ICD-10-CM

## 2011-06-12 DIAGNOSIS — E039 Hypothyroidism, unspecified: Secondary | ICD-10-CM

## 2011-06-12 DIAGNOSIS — E7800 Pure hypercholesterolemia, unspecified: Secondary | ICD-10-CM

## 2011-06-12 DIAGNOSIS — R079 Chest pain, unspecified: Secondary | ICD-10-CM

## 2011-06-12 DIAGNOSIS — I10 Essential (primary) hypertension: Secondary | ICD-10-CM

## 2011-06-12 DIAGNOSIS — G459 Transient cerebral ischemic attack, unspecified: Secondary | ICD-10-CM

## 2011-06-12 DIAGNOSIS — I671 Cerebral aneurysm, nonruptured: Secondary | ICD-10-CM

## 2011-06-12 NOTE — Patient Instructions (Signed)
Today we updated your med list in our EPIC system...    Continue your current medications the same...  For your Breathing:  Continue w/ the MUCINEX 1-2 tabs twice daily w/ fluids...    And you may use the Tussionex as needed...    If necessary we can re-start the Advair for any airway inflammation...  For your Chest Pain:  We will arrange for a Cardiac screening evaluation w/ DrKelly...  Call for any questions...  Let's plan a routine check in 3-4 months w/ FASTING blood work at that time.Marland KitchenMarland Kitchen

## 2011-06-12 NOTE — Progress Notes (Signed)
Subjective:    Patient ID: Debra Mcintyre, female    DOB: 1941-08-02, 70 y.o.   MRN: [id number]  HPI  70 y/o WF here for a follow up visit... she has multiple medical problems as noted below...   ~  August 26, 2009:  states she's had a good yr- no new complaints or concerns... BP controlled on meds & needs refill perscriptions today... 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 she understood DrD to say everything looked good, no changes, same Rx...  she also had f/u colonoscopy by DrDBrodie 11/10 w/ 2 polyps removed.  ~  August 29, 2010:  Yearly check up- doing well overall w/o new complaints or concerns... had URI/ sinus 11/11 Rx'd w/ Augmentin, Mucinex, Saline> resolved... no change in meds- tol well, etc... she notes wt down 5# w/ diet + exercise... BP controlled on meds;  no cerebral ischemic symptoms on the ASA/ Plavix;  Chol controlled on Lip80 + diet;  Thyroid sl over-replaced on the [id number] but she doesn't want to decr dose w/ energy good, wt down, etc;  she notes that DrCousins keeps up w/ her BMD & Rx w/ calcium, MVI;  she remains on Effexor75mg /d "it keeps me on an even keel";  still refuses Flu vaccine.  ~  April 21, 2011:  Add-on appt for refractory asthmatic bronchitis>    She notes >44mo hx cough, congestion, thick beige sputum, and assoc SOB/ wheezing/ etc;  Seen by TP w/ bronchitis 8/20 & treated w/ Augmentin, Pred taper, Mucinex, Hydromet;  Improved but not resolved & recently experienced incr dyspnea, wheezing, chest congestion;  Denies f/c/s, no hemoptysis, min CP just from the coughing, not resting well...    Other medical issues appear stable> BP controlled on Metoprolol & Lisinopril/HCT;  Cerebrovasc  dis stable on ASA/ Plavix;  Lipids controlled on diet + Lip80;  Thyroid remains regulated & clinically euthyroid on 150mcg/d;  Anxiety controlled on Effexor...    CXR 04/03/11 reviewed> Clear, NAD;  PFT today showed FVC=2.75 (82%), FEV1=2.15 (84%), %1sec=78, Mid-flows=90%pred...    We decided to treat w/ DepoMedrol, PREDNISONE 4d-tapering sched, ADVAIR 250 Bid sampler; MUCINEX 2Bid, Fluids, & TUSSIONEX; rov 2-3 wks.  ~  June 12, 2011:  6wk ROV & she reports that her prev AB is much improved/ resolved; she has min residual AM cough/ phlegm & denies chest tightness, wheezing, congestion, etc;  She has finished the Pred, stopped the Advair & Mucinex, and uses the Tussionex as needed; I indicated to her that we might need to restart the Advair & Mucinex if symptoms recur...    NEW PROB = Chest Pain >> she describes what she called "heartburn" by which she means a CP= dull pressure sensation & tightness substernally that grabs her & occas radiates to the jaw; denies N V diaphoresis SOB or radiation to the arms etc; the pain occurs about once every 2wks on average usually while hurrying about but occas at rest; she says it will dissipate after GasX in about , but goes away after if she takes an aspirin;  She has no known hx heart disease, but risk factors include +FamHx (Father w/ hrt dis but died age 33), HBP, Chol, remote smoker...  EKG shows rsr' c/w  IVCD, leftward axis, NSR, no acute STTWA...  We discussed referral to Cards for screening eval...          Problem List:  HYPERTENSION (ICD-401.9) - on TOPROL XL 100mg /d,  LISINOPRIL/ Hct 20/12.5 daily... BP= 114/68 today and she says similar readings at home... takes meds regularly & tol well... denies HA, fatigue, visual changes, CP, palipit, dizziness, syncope, dyspnea, edema, etc... she does water aerobics & walks for exercise...  ~  10/12:  Presents w/ chest discomfort> referred to Cards for screening eval...  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 she's been stable since that time w/ out pt f/u by IR, [name]> she indicates he said no further studies needed. ~  CDopplers 5/04 showed mild plaque, no signif ICA stenoses... ~  MR studies 10/09 showed sm vessel dis; poss restenosis left MCA w/ angiogram rec- but wasn't done; mod stenosis at origin of left vertebral art w/ tortuosity... 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 ~  10/12:  she remains asymptomatic- w/o cerebral ischemic symptoms...  HYPERCHOLESTEROLEMIA (ICD-272.0) - on LIPITOR 80mg /d (she stopped prev FishOil supplements). ~  FLP 3/08 shows Tchol 180, TG 206, HDL 49, LDL 105... rec same meds, better diet, get wt down. ~  FLP 10/09 showed TChol 150, TG 178, HDL 41, LDL 74 ~  FLP 1/11 on Lip80+Zetia10 showed TChol 162, TG 226, HDL 47, LDL 95... she wants to stop Zetia, get on diet & get wt down. ~  FLP 1/12 on Lip80 showed TChol 173, TG 164, HDL 45, LDL 95  HYPOTHYROIDISM (ICD-244.9) - on SYNTHROID 146mcg/d... ~  labs 3/08 showed TSH = 0.23 ~  labs 10/09 showed TSH= 0.64 ~  labs 1/11 showed TSH= 0.18... she wants to keep same dose to aide wt reduction. ~  labs 1/12 showed TSH= 0.20... ditto  COLONIC POLYPS (ICD-211.3), & Hx of HEMORRHOIDS (ICD-455.6) ~  colonoscopy 10/00 by DrDBrodie showed hems only... f/u planned 45yrs. ~  f/u colonoscopy 11/10 showed 2 polyps- one adenomatous w/ f/u planned 84yrs.  DEGENERATIVE JOINT DISEASE (ICD-715.90) - uses Tylenol & OTC meds as needed... 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 second opinion from DrWWard @  [facility]  confirmed this- no surg necessary... ~  1/11:  notes some pain in hands & wrist... ~  1/12:  Stable w/o acute complaints or problem areas...  FIBROMYALGIA (ICD-729.1)  OSTEOPOROSIS (ICD-733.00) - she indicates that this is followed & managed by GYN, DrCousins.Marland KitchenMarland Kitchen ?when last BMD was done "she keeps up w/ this" > on Calcium, & Vitamins... ~  labs 1/11 showed Vit D level = 54...  TIA (ICD-435.9) - as above, she remains on ASA & Plavix... last saw DrReynolds in 2006... 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 arteriogram by Dr. Corliss Skains confirmed a web-like plaque in the left M1 segment of the left MCA with signif stenosis...  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 subseq PTA of left MCA w/ good result- resid 20% stenosis seen on f/u angiograms along w/ a 1mm saccular aneurysm seen  in the right MCA trifurcation...  ANXIETY (ICD-300.00) - on EFFEXOR 75mg /d for hot flashes, she says.... she wishes to continue the med "it keeps me on an even keel".  Health Maintenance - GYN= DrCousins & she will call for f/u... [facility]... BMD[facility] at [facility] & results sent to DrCousins... ~  Immunizations: she refuses Flu vaccine... given PNEUMOVAX- 1/11, and Tdap- 1/11...   Past Surgical History  Procedure Date  . Left middle cerebral artery angioplasty 2004    by [facility]    Outpatient Encounter Prescriptions as of 06/12/2011  Medication Sig Dispense Refill  . aspirin 325 MG tablet Take 325 mg by mouth daily.        Marland Kitchen atorvastatin (LIPITOR) 80 MG tablet Take 1 tablet (80 mg total) by mouth daily.  30 tablet  11  . Calcium Carbonate-Vitamin D (CALCIUM 600+D) 600-200 MG-UNIT TABS Take 1 tablet by mouth 2 (two) times daily.        . chlorpheniramine-HYDROcodone (TUSSIONEX PENNKINETIC ER) 10-8 MG/5ML LQCR Take 5 mLs by mouth every 12 (twelve) hours.  120 mL  1  . clopidogrel (PLAVIX) 75 MG  tablet Take 75 mg by mouth daily.        Marland Kitchen levothyroxine (SYNTHROID, LEVOTHROID) 125 MCG tablet Take 1 tablet (125 mcg total) by mouth daily.  30 tablet  11  . lisinopril-hydrochlorothiazide (PRINZIDE) 20-12.5 MG per tablet Take 1 tablet by mouth daily.  30 tablet  11  . metoprolol (TOPROL-XL) 100 MG 24 hr tablet Take 1 tablet (100 mg total) by mouth daily.  30 tablet  11  . Multiple Vitamin (MULTIVITAMIN) capsule Take 1 capsule by mouth daily.        . Omega-3 Fatty Acids (FISH OIL) 1200 MG CAPS Take 1 capsule by mouth 2 (two) times daily.        Marland Kitchen venlafaxine (EFFEXOR-XR) 75 MG 24 hr capsule Take 75 mg by mouth daily.        Marland Kitchen DISCONTD: predniSONE (DELTASONE) 20 MG tablet Take 1 tab bid x 4 days, 1 tab daily x 4 days, 1/2 tab daily until return ov  20 tablet  0    No Known Allergies   Current Medications, Allergies, Past Medical History, Past Surgical History, Family History, and Social History were reviewed in [employer] record.   Family History  Problem Relation Age of Onset  . Mother died age 82 w/ ?Lung cancer, mets- nonsmoker... Mother   . Father died age 42 from old age, Hx heart disease/ AFib,,, Father   . 1 Sis alive age 72> good health Sister   . 1 Bro died age 97 from Alcohol abuse, hx colon cancer... Brother    Social History  Substance Use Topics  . Smoking status: Former Smoker -- smoked 1PPD for 20 yrs    Types:  started in 20's & quit around 33    Quit date: 04/02/1986    Review of Systems        The patient complains of dyspnea on exertion, back pain, joint pain, and arthritis.  The patient denies fever, chills, sweats, anorexia, fatigue, weakness, malaise, weight loss, sleep disorder, blurring, diplopia, eye irritation, eye discharge, vision loss, eye pain, photophobia, earache, ear discharge, tinnitus, decreased hearing, nasal congestion, nosebleeds, sore throat, hoarseness, chest pain, palpitations, syncope, orthopnea, PND, peripheral edema,  cough, dyspnea at rest, excessive sputum, hemoptysis, wheezing, pleurisy, nausea, vomiting, diarrhea, constipation, change in bowel habits, abdominal pain, melena, hematochezia, jaundice, gas/bloating, indigestion/heartburn, dysphagia, odynophagia, dysuria,  hematuria, urinary frequency, urinary hesitancy, nocturia, incontinence, joint swelling, muscle cramps, muscle weakness, stiffness, sciatica, restless legs, leg pain at night, leg pain with exertion, rash, itching, dryness, suspicious lesions, paralysis, paresthesias, seizures, tremors, vertigo, transient blindness, frequent falls, frequent headaches, difficulty walking, depression, anxiety, memory loss, confusion, cold intolerance, heat intolerance, polydipsia, polyphagia, polyuria, unusual weight change, abnormal bruising, bleeding, enlarged lymph nodes, urticaria, allergic rash, hay fever, and recurrent infections.     Objective:   Physical Exam      WD, WN, 70 y/o WF in NAD... GENERAL:  Alert & oriented; pleasant & cooperative... HEENT:  [place]/AT, EOM-wnl, PERRLA, EACs-clear, TMs-wnl, NOSE-clear, THROAT-clear & wnl. NECK:  Supple w/ fairROM; no JVD; normal carotid impulses w/o bruits; no thyromegaly or nodules palpated; no lymphadenopathy. CHEST:  Bibasilar wheezing & rhonchi w/ congested cough but no rales or signs of consolidation... HEART:  Regular Rhythm; without murmurs/ rubs/ or gallops. ABDOMEN:  Soft & nontender; normal bowel sounds; no organomegaly or masses palpated. EXT: without deformities, mild arthritic changes; no varicose veins/ +venous insuffic/ no edema. NEURO:  CN's intact;  no focal neuro deficits... DERM:  No lesions noted; no rash etc...   Assessment & Plan:   CHEST DISCOMFORT>  The pain is somewhat atypical but she clearl needs a cardiac screening eval & we will refer > she requests appt w/ Howell Rucks who knows her husb very well...  ASTHMATIC BRONCHITIS>  She finally responded to the combination therapy for her AB-  weaned off Pred & encouraged to STAY on the Advair Bid, Mucinex Bid, etc...  HBP>  Controlled on BBlocker, & ACE/diuretic, continue same...  Cerebrovasc Dis>  She had TIA 2004 w/ eval revealing left MCA stenosis; DrDeveshwar did PTA w/ good result; incidental 1-62mm aneurysm noted in the right MCA; she has remained asymptomatic since 2004 on the ASA/ Plavix.  CHOL>  Stable on diet + Lip80...  HPYOTHYROID>  On Synthroid 165mcg/d w/ TSH sl low but she likes it that was & does not want to decr the dose...  Other medical problems as noted.Marland KitchenMarland Kitchen

## 2011-06-18 ENCOUNTER — Encounter: Payer: Self-pay | Admitting: Pulmonary Disease

## 2011-07-24 ENCOUNTER — Telehealth: Payer: Self-pay | Admitting: Pulmonary Disease

## 2011-07-24 MED ORDER — OSELTAMIVIR PHOSPHATE 75 MG PO CAPS: ORAL_CAPSULE | ORAL | Status: DC

## 2011-07-24 NOTE — Telephone Encounter (Signed)
Per SN---rest, fluids, tylenol as directed per bottle instructions, tamiflu 75mg   #10  1 po bid until gone.  thanks

## 2011-07-24 NOTE — Telephone Encounter (Signed)
I spoke with pt and she c/o chills, fever 100.8, feels fatigue, body aches, dry cough, nausea but no vomiting x Saturday night. Pt has not tried anything OTC. Pt is requesting further recs from Dr. Kriste Basque. Please advise, thanks  No Known Allergies

## 2011-07-24 NOTE — Telephone Encounter (Signed)
Pt aware of sn recs and rx's have been called into pharmacy for pt

## 2011-08-29 ENCOUNTER — Other Ambulatory Visit: Payer: Self-pay | Admitting: Pulmonary Disease

## 2011-09-07 ENCOUNTER — Other Ambulatory Visit: Payer: Self-pay | Admitting: Pulmonary Disease

## 2011-12-28 ENCOUNTER — Other Ambulatory Visit: Payer: Self-pay | Admitting: Pulmonary Disease

## 2011-12-29 ENCOUNTER — Telehealth: Payer: Self-pay | Admitting: Pulmonary Disease

## 2011-12-29 NOTE — Telephone Encounter (Signed)
Gave refill verbally to [facility] and made pt aware this has been done

## 2012-01-17 DIAGNOSIS — D235 Other benign neoplasm of skin of trunk: Secondary | ICD-10-CM | POA: Diagnosis not present

## 2012-01-17 DIAGNOSIS — L719 Rosacea, unspecified: Secondary | ICD-10-CM | POA: Diagnosis not present

## 2012-03-25 ENCOUNTER — Telehealth: Payer: Self-pay | Admitting: Pulmonary Disease

## 2012-03-25 MED ORDER — AMOXICILLIN-POT CLAVULANATE 875-125 MG PO TABS: 1.0000 | ORAL_TABLET | Freq: Two times a day (BID) | ORAL | Status: AC

## 2012-03-25 NOTE — Telephone Encounter (Signed)
I spoke with pt and she c/o cough w/ dark green phlem, fever of 100.4-100.8 (taking ASA every 4 hours). Chest congestion, scratchy throat, HA, nasal congestion x Friday. Pt states the prescription cough syrup is too expensive and wanted to know what OTC cough syrup she could take. i advised her she could take delsym and take mucinex OTC as well--I also advised pt she could take ibuprofen or tylenol to help with the fever since she has not done this. She voiced her understanding and will send to SN fpr further recs. Please advise thanks  No Known Allergies   [facility]

## 2012-03-25 NOTE — Telephone Encounter (Signed)
Per SN---delsym is the best.    This sounds like bronchitis.   augmentin 875 mg  #14  1 po bid , increase fluids along with mucinex 2 po bid and use the delsym for the cough.  thanks

## 2012-03-25 NOTE — Telephone Encounter (Signed)
I spoke with pt and is aware of SN recs. I have sent rx to the pharmacy and pt aware of directions. Nothing further was needed

## 2012-03-29 ENCOUNTER — Other Ambulatory Visit: Payer: Self-pay | Admitting: Pulmonary Disease

## 2012-04-02 DIAGNOSIS — Z1231 Encounter for screening mammogram for malignant neoplasm of breast: Secondary | ICD-10-CM | POA: Diagnosis not present

## 2012-04-04 DIAGNOSIS — Z09 Encounter for follow-up examination after completed treatment for conditions other than malignant neoplasm: Secondary | ICD-10-CM | POA: Diagnosis not present

## 2012-04-04 DIAGNOSIS — R928 Other abnormal and inconclusive findings on diagnostic imaging of breast: Secondary | ICD-10-CM | POA: Diagnosis not present

## 2012-04-29 ENCOUNTER — Other Ambulatory Visit: Payer: Self-pay | Admitting: Pulmonary Disease

## 2012-05-07 ENCOUNTER — Other Ambulatory Visit: Payer: Self-pay | Admitting: Pulmonary Disease

## 2012-05-27 ENCOUNTER — Encounter: Payer: Self-pay | Admitting: *Deleted

## 2012-05-28 ENCOUNTER — Ambulatory Visit (INDEPENDENT_AMBULATORY_CARE_PROVIDER_SITE_OTHER): Payer: Medicare Other | Admitting: Pulmonary Disease

## 2012-05-28 ENCOUNTER — Encounter: Payer: Self-pay | Admitting: Pulmonary Disease

## 2012-05-28 VITALS — BP 104/68 | HR 64 | Temp 97.7°F | Ht 67.5 in | Wt 170.8 lb

## 2012-05-28 DIAGNOSIS — I10 Essential (primary) hypertension: Secondary | ICD-10-CM | POA: Diagnosis not present

## 2012-05-28 DIAGNOSIS — IMO0001 Reserved for inherently not codable concepts without codable children: Secondary | ICD-10-CM

## 2012-05-28 DIAGNOSIS — M810 Age-related osteoporosis without current pathological fracture: Secondary | ICD-10-CM

## 2012-05-28 DIAGNOSIS — Z23 Encounter for immunization: Secondary | ICD-10-CM | POA: Diagnosis not present

## 2012-05-28 DIAGNOSIS — F411 Generalized anxiety disorder: Secondary | ICD-10-CM

## 2012-05-28 DIAGNOSIS — E039 Hypothyroidism, unspecified: Secondary | ICD-10-CM

## 2012-05-28 DIAGNOSIS — G459 Transient cerebral ischemic attack, unspecified: Secondary | ICD-10-CM

## 2012-05-28 DIAGNOSIS — E7800 Pure hypercholesterolemia, unspecified: Secondary | ICD-10-CM | POA: Diagnosis not present

## 2012-05-28 DIAGNOSIS — D126 Benign neoplasm of colon, unspecified: Secondary | ICD-10-CM

## 2012-05-28 DIAGNOSIS — M1990 Unspecified osteoarthritis, unspecified site: Secondary | ICD-10-CM

## 2012-05-28 DIAGNOSIS — I679 Cerebrovascular disease, unspecified: Secondary | ICD-10-CM

## 2012-05-28 MED ORDER — ROSUVASTATIN CALCIUM 20 MG PO TABS: 20.0000 mg | ORAL_TABLET | Freq: Every day | ORAL | Status: DC

## 2012-05-28 MED ORDER — LEVOTHYROXINE SODIUM 125 MCG PO TABS: 125.0000 ug | ORAL_TABLET | Freq: Every day | ORAL | Status: DC

## 2012-05-28 NOTE — Patient Instructions (Addendum)
Today we updated your med list in our EPIC system...    Continue your current medications the same...  Today we gave you the 2013 Flu vaccine...  Please return to our lab one morning this week for your FASTING blood work...    We will call you w/ the results...  We decided to change the Lipitor80 to CRESTOR 20mg /d to start & would like to check a lipid profile in 3-4 months on this dose (give [place] a call when you are ready to do the FLP...  Call for any problems or if we can be of service in any way.Marland KitchenMarland Kitchen

## 2012-05-28 NOTE — Progress Notes (Signed)
Subjective:    Patient ID: Debra Mcintyre, female    DOB: 12/02/1940, 71 y.o.   MRN: [id number]  HPI 71 y/o WF here for a follow up visit... she has multiple medical problems as noted below...   ~  August 29, 2010:  Yearly check up- doing well overall w/o new complaints or concerns... had URI/ sinus 11/11 Rx'd w/ Augmentin, Mucinex, Saline> resolved... no change in meds- tol well, etc... she notes wt down 5# w/ diet + exercise... BP controlled on meds;  no cerebral ischemic symptoms on the ASA/ Plavix;  Chol controlled on Lip80 + diet;  Thyroid sl over-replaced on the [id number] but she doesn't want to decr dose w/ energy good, wt down, etc;  she notes that DrCousins keeps up w/ her BMD & Rx w/ calcium, MVI;  she remains on Effexor75mg /d "it keeps me on an even keel";  still refuses Flu vaccine.  ~  April 21, 2011:  Add-on appt for refractory asthmatic bronchitis>    She notes >40mo hx cough, congestion, thick beige sputum, and assoc SOB/ wheezing/ etc;  Seen by TP w/ bronchitis 8/20 & treated w/ Augmentin, Pred taper, Mucinex, Hydromet;  Improved but not resolved & recently experienced incr dyspnea, wheezing, chest congestion;  Denies f/c/s, no hemoptysis, min CP just from the coughing, not resting well...    Other medical issues appear stable> BP controlled on Metoprolol & Lisinopril/HCT;  Cerebrovasc dis stable on ASA/ Plavix;  Lipids controlled on diet + Lip80;  Thyroid remains regulated & clinically euthyroid on 157mcg/d;  Anxiety controlled on Effexor...    CXR 04/03/11 reviewed> Clear, NAD;  PFT today showed FVC=2.75 (82%), FEV1=2.15 (84%), %1sec=78, Mid-flows=90%pred...    We decided to treat w/ DepoMedrol, PREDNISONE 4d-tapering sched, ADVAIR 250 Bid sampler; MUCINEX 2Bid, Fluids, & TUSSIONEX; rov 2-3 wks.  ~  June 12, 2011:  6wk ROV & she reports that her prev AB is much improved/ resolved; she has min residual AM cough/ phlegm & denies chest tightness, wheezing, congestion, etc;  She  has finished the Pred, stopped the Advair & Mucinex, and uses the Tussionex as needed; I indicated to her that we might need to restart the Advair & Mucinex if symptoms recur...    NEW PROB = Chest Pain >> she describes what she called "heartburn" by which she means a CP= dull pressure sensation & tightness substernally that grabs her & occas radiates to the jaw; denies N V diaphoresis SOB or radiation to the arms etc; the pain occurs about once every 2wks on average usually while hurrying about but occas at rest; she says it will dissipate after GasX in about , but goes away after if she takes an aspirin;  She has no known hx heart disease, but risk factors include +FamHx (Father w/ hrt dis but died age 83), HBP, Chol, remote smoker...  EKG shows rsr' c/w IVCD, leftward axis, NSR, no acute STTWA...  We discussed referral to Cards for screening eval==> she requests [facility].  ~  May 28, 2012:  Yearly ROV & Sterling has been stable, no new complaints or concerns except for some paresthesia in fingers & toes that she believes is due to Lip80, and she wants to change meds... She has 5 grandchildren & the oldest grand daughter received a [employer] schlorship...    HBP> on MetopER100, LisinHCT20-12.5; BP= 104/68 & she denies CP, palpit, SOB, edema; we never received any data from [facility]...    Cerebrovasc Dis> on ASA325 & Plavix75;  s/p aneurysm & TIA; stable w/o cerebral ischemic symptoms- she has not had f/u DrDeveshwar,IR- she notes she was told no need for f/u angiography (we might consider MRI later)...    Chol> on Lip80 but she says only taking intermittently due to tingling in fingers & toes she feels is due to this- FLP showed TChol 189, TG 231, HDL 45, LDL 112; we reviewed low fat wt reducing diet, & decided to change to CRESTOR20/d...    Hypothyroid> on Synthroid 18mcg/d; TSH= 1.20& she is clinically euthyroid...    Colon polyps> last colon was 11/10 w/ one adenomatous polyp removed, f/u  planned 48yrs.    DJD, FM, Osteop> on Calcium, MVI, OTC analgesics; BMD followed by her GYN...    Anxiety> on EffexorXR75 and she reports stable on this med...  We reviewed prob list, meds, xrays and labs> see below for updates >> OK Flu vaccine today... LABS 10/13:  FLP- not at goals on intermit Lip80;  Chems- wnl;  CBC- wnl;  TSH=1.20          Problem List:  HYPERTENSION (ICD-401.9) - on TOPROL XL 100mg /d,  LISINOPRIL/ Hct 20/12.5 daily... BP= 114/68 today and she says similar readings at home... takes meds regularly & tol well... denies HA, fatigue, visual changes, CP, palipit, dizziness, syncope, dyspnea, edema, etc... she does water aerobics & walks for exercise...  ~  CXR 8/12 showed normal heart size, clear lungs, scoliosis, NAD... ~  10/12:  Presents w/ chest discomfort> EKG showed NSR, rate 69, rsr' in V1-2, otherw wnl; referred to Cards=> she requests DrKelly.  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 she's been stable since that time w/ out pt f/u by IR, [name]> she indicates he said no further studies needed. ~  CDopplers 5/04 showed mild plaque, no signif ICA stenoses... ~  MR studies 10/09 showed sm vessel dis; poss restenosis left MCA w/ angiogram rec- but wasn't done; mod stenosis at origin of left vertebral art w/ tortuosity... 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 ~  10/12:  she remains asymptomatic- w/o cerebral ischemic symptoms...  HYPERCHOLESTEROLEMIA (ICD-272.0) - on LIPITOR 80mg /d (she stopped prev FishOil supplements)> see below... ~  FLP 3/08 shows Tchol 180, TG 206, HDL 49, LDL 105... rec same meds,  better diet, get wt down. ~  FLP 10/09 showed TChol 150, TG 178, HDL 41, LDL 74 ~  FLP 1/11 on Lip80+Zetia10 showed TChol 162, TG 226, HDL 47, LDL 95... she wants to stop Zetia, get on diet & get wt down. ~  FLP 1/12 on Lip80 showed TChol 173, TG 164, HDL 45, LDL 95 ~  FLP 10/13 on Lip80 intermittently showed TChol 189, TG 231, HDL 45, LDL 112... She was c/o paresthesias that she was convinced was due to the Lip80 & she requested change to CRESTOR 20mg /d...  HYPOTHYROIDISM (ICD-244.9) - on SYNTHROID 117mcg/d... ~  labs 3/08 showed TSH = 0.23 ~  labs 10/09 showed TSH= 0.64 ~  labs 1/11 showed TSH= 0.18... she wants to keep same dose to aide wt reduction. ~  labs 1/12 showed TSH= 0.20... Ditto ~  Labs 10/13 on Levo125 showed TSH= 1.20  COLONIC POLYPS (ICD-211.3), &  Hx of HEMORRHOIDS (ICD-455.6) ~  colonoscopy 10/00 by DrDBrodie showed hems only... f/u planned 53yrs. ~  f/u colonoscopy 11/10 showed 2 polyps- one adenomatous w/ f/u planned 8yrs.  DEGENERATIVE JOINT DISEASE (ICD-715.90) - uses Tylenol & OTC meds as needed... 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 second opinion from DrWWard @  [facility] confirmed this- no surg necessary... ~  1/11:  notes some pain in hands & wrist... ~  1/12:  Stable w/o acute complaints or problem areas...  FIBROMYALGIA (ICD-729.1)  OSTEOPOROSIS (ICD-733.00) - she indicates that this is followed & managed by GYN, DrCousins> ?when last BMD was done "she keeps up w/ this" > on Calcium, & Vitamins... ~  labs 1/11 showed Vit D level = 54...  TIA (ICD-435.9) - as above, she remains on ASA325 & Plavix75... last saw DrReynolds in 2006... 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 arteriogram by Dr. Mamie Nick confirmed a web-like plaque in the left M1 segment of the left MCA with signif stenosis...  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 subseq PTA of left MCA w/ good result- resid 20% stenosis seen on f/u angiograms along w/ a 1mm saccular aneurysm seen in the right MCA trifurcation...  ANXIETY (ICD-300.00) - on EFFEXOR 75mg /d for hot flashes, she says.... she wishes to continue the med "it keeps me on an even keel".  Health Maintenance - GYN= DrCousins & she will call for f/u... Mammograms at [facility]... BMDs at [facility] & results sent to DrCousins... ~  Immunizations: she refuses Flu vaccine... given PNEUMOVAX- 1/11, and Tdap- 1/11...   Past Surgical History  Procedure Date  . Left middle cerebral artery angioplasty 2004    by [facility]    Outpatient Encounter Prescriptions as of 05/28/2012  Medication Sig Dispense Refill  . aspirin 325 MG tablet Take 325 mg by mouth daily.        Marland Kitchen atorvastatin (LIPITOR) 80 MG tablet TAKE ONE TABLET BY MOUTH DAILY  30 tablet  10  . Calcium Carbonate-Vitamin D (CALCIUM 600+D) 600-200 MG-UNIT TABS Take 1 tablet by mouth 2 (two) times daily.        Marland Kitchen levothyroxine (SYNTHROID, LEVOTHROID) 125 MCG tablet TAKE ONE TABLET BY MOUTH EVERY DAY  30 tablet  0  . lisinopril-hydrochlorothiazide (PRINZIDE) 20-12.5 MG per tablet Take 1 tablet by mouth daily.  30 tablet  11  . metoprolol succinate (TOPROL-XL) 100 MG 24 hr tablet TAKE ONE TABLET BY MOUTH EVERY DAY  30 tablet  0  . Multiple Vitamin (MULTIVITAMIN) capsule Take 1 capsule by mouth daily.        . Omega-3 Fatty Acids (FISH OIL) 1200 MG CAPS Take 1 capsule by mouth 2 (two) times daily.        Marland Kitchen PLAVIX 75 MG tablet TAKE ONE TABLET BY MOUTH EVERY DAY  30 each  11  . venlafaxine XR (EFFEXOR-XR) 75 MG 24 hr capsule TAKE ONE CAPSULE BY MOUTH EVERY DAY  30 capsule  1  . DISCONTD: chlorpheniramine-HYDROcodone (TUSSIONEX PENNKINETIC ER) 10-8 MG/5ML LQCR Take 5 mLs by mouth every 12 (twelve) hours.  120 mL  1  . DISCONTD: lisinopril-hydrochlorothiazide (PRINZIDE,ZESTORETIC) 20-12.5 MG per tablet TAKE ONE TABLET BY MOUTH ONCE A DAY   30 tablet  2  . DISCONTD: oseltamivir (TAMIFLU) 75 MG capsule 1 tablet twice a day until gone  10 capsule  0  No Known Allergies   Current Medications, Allergies, Past Medical History, Past Surgical History, Family History, and Social History were reviewed in [employer] record.    Review of Systems        The patient complains of dyspnea on exertion, back pain, joint pain, and arthritis.  The patient denies fever, chills, sweats, anorexia, fatigue, weakness, malaise, weight loss, sleep disorder, blurring, diplopia, eye irritation, eye discharge, vision loss, eye pain, photophobia, earache, ear discharge, tinnitus, decreased hearing, nasal congestion, nosebleeds, sore throat, hoarseness, chest pain, palpitations, syncope, orthopnea, PND, peripheral edema, cough, dyspnea at rest, excessive sputum, hemoptysis, wheezing, pleurisy, nausea, vomiting, diarrhea, constipation, change in bowel habits, abdominal pain, melena, hematochezia, jaundice, gas/bloating, indigestion/heartburn, dysphagia, odynophagia, dysuria, hematuria, urinary frequency, urinary hesitancy, nocturia, incontinence, joint swelling, muscle cramps, muscle weakness, stiffness, sciatica, restless legs, leg pain at night, leg pain with exertion, rash, itching, dryness, suspicious lesions, paralysis, paresthesias, seizures, tremors, vertigo, transient blindness, frequent falls, frequent headaches, difficulty walking, depression, anxiety, memory loss, confusion, cold intolerance, heat intolerance, polydipsia, polyphagia, polyuria, unusual weight change, abnormal bruising, bleeding, enlarged lymph nodes, urticaria, allergic rash, hay fever, and recurrent infections.     Objective:   Physical Exam      WD, WN, 71 y/o WF in NAD... GENERAL:  Alert & oriented; pleasant & cooperative... HEENT:  [place]/AT, EOM-wnl, PERRLA, EACs-clear, TMs-wnl, NOSE-clear, THROAT-clear & wnl. NECK:  Supple w/ fairROM; no JVD; normal carotid  impulses w/o bruits; no thyromegaly or nodules palpated; no lymphadenopathy. CHEST:  Bibasilar wheezing & rhonchi w/ congested cough but no rales or signs of consolidation... HEART:  Regular Rhythm; without murmurs/ rubs/ or gallops. ABDOMEN:  Soft & nontender; normal bowel sounds; no organomegaly or masses palpated. EXT: without deformities, mild arthritic changes; no varicose veins/ +venous insuffic/ no edema. NEURO:  CN's intact;  no focal neuro deficits... DERM:  No lesions noted; no rash etc...  RADIOLOGY DATA:  Reviewed in the EPIC EMR & discussed w/ the patient...  LABORATORY DATA:  Reviewed in the EPIC EMR & discussed w/ the patient...   Assessment & Plan:    ASTHMATIC BRONCHITIS>  No recent URIs or resp exac & she is once again not on regular meds, denies breathing problems, etc...  HBP>  Controlled on BBlocker, & ACE/diuretic, continue same...  Cerebrovasc Dis>  She had TIA 2004 w/ eval revealing left MCA stenosis; DrDeveshwar did PTA w/ good result; incidental 1-92mm aneurysm noted in the right MCA; she has remained asymptomatic since 2004 on the ASA/ Plavix.  CHOL>  Unable to tol Lip80 due to paresthesias & wants to change to Crestor- we will start w/ 20mg  & f/u FLP on this...  HPYOTHYROID>  On Synthroid 149mcg/d w/ TSH wnl now, continue same...  Other medical problems as noted...   Patient's Medications  New Prescriptions   No medications on file  Previous Medications   ASPIRIN 325 MG TABLET    Take 325 mg by mouth daily.     CALCIUM CARBONATE-VITAMIN D (CALCIUM 600+D) 600-200 MG-UNIT TABS    Take 1 tablet by mouth 2 (two) times daily.     LISINOPRIL-HYDROCHLOROTHIAZIDE (PRINZIDE) 20-12.5 MG PER TABLET    Take 1 tablet by mouth daily.   MULTIPLE VITAMIN (MULTIVITAMIN) CAPSULE    Take 1 capsule by mouth daily.     OMEGA-3 FATTY ACIDS (FISH OIL) 1200 MG CAPS    Take 1 capsule by mouth 2 (two) times daily.     PLAVIX 75 MG TABLET  TAKE ONE TABLET BY MOUTH EVERY DAY    VENLAFAXINE XR (EFFEXOR-XR) 75 MG 24 HR CAPSULE    TAKE ONE CAPSULE BY MOUTH EVERY DAY  Modified Medications   Modified Medication Previous Medication   LEVOTHYROXINE (SYNTHROID, LEVOTHROID) 125 MCG TABLET levothyroxine (SYNTHROID, LEVOTHROID) 125 MCG tablet      Take 1 tablet (125 mcg total) by mouth daily.    TAKE ONE TABLET BY MOUTH EVERY DAY   METOPROLOL SUCCINATE (TOPROL-XL) 100 MG 24 HR TABLET metoprolol succinate (TOPROL-XL) 100 MG 24 hr tablet      TAKE ONE TABLET BY MOUTH EVERY DAY    TAKE ONE TABLET BY MOUTH EVERY DAY   ROSUVASTATIN (CRESTOR) 20 MG TABLET rosuvastatin (CRESTOR) 20 MG tablet      Take 1 tablet (20 mg total) by mouth daily.    Take 20 mg by mouth daily.  Discontinued Medications   ATORVASTATIN (LIPITOR) 80 MG TABLET    TAKE ONE TABLET BY MOUTH DAILY   CHLORPHENIRAMINE-HYDROCODONE (TUSSIONEX PENNKINETIC ER) 10-8 MG/5ML LQCR    Take 5 mLs by mouth every 12 (twelve) hours.   LISINOPRIL-HYDROCHLOROTHIAZIDE (PRINZIDE,ZESTORETIC) 20-12.5 MG PER TABLET    TAKE ONE TABLET BY MOUTH ONCE A DAY   OSELTAMIVIR (TAMIFLU) 75 MG CAPSULE    1 tablet twice a day until gone

## 2012-05-29 ENCOUNTER — Other Ambulatory Visit: Payer: Self-pay | Admitting: Pulmonary Disease

## 2012-05-31 ENCOUNTER — Encounter: Payer: Self-pay | Admitting: Pulmonary Disease

## 2012-06-04 ENCOUNTER — Other Ambulatory Visit (INDEPENDENT_AMBULATORY_CARE_PROVIDER_SITE_OTHER): Payer: Medicare Other

## 2012-06-04 DIAGNOSIS — E039 Hypothyroidism, unspecified: Secondary | ICD-10-CM

## 2012-06-04 DIAGNOSIS — M5137 Other intervertebral disc degeneration, lumbosacral region: Secondary | ICD-10-CM | POA: Diagnosis not present

## 2012-06-04 DIAGNOSIS — I10 Essential (primary) hypertension: Secondary | ICD-10-CM

## 2012-06-04 DIAGNOSIS — E7800 Pure hypercholesterolemia, unspecified: Secondary | ICD-10-CM | POA: Diagnosis not present

## 2012-06-04 DIAGNOSIS — D126 Benign neoplasm of colon, unspecified: Secondary | ICD-10-CM | POA: Diagnosis not present

## 2012-06-04 LAB — CBC WITH DIFFERENTIAL/PLATELET
Basophils Absolute: 0.1 10*3/uL (ref 0.0–0.1)
Hemoglobin: 13.9 g/dL (ref 12.0–15.0)
Lymphocytes Relative: 38.3 % (ref 12.0–46.0)
Monocytes Relative: 7.6 % (ref 3.0–12.0)
Neutro Abs: 2.9 10*3/uL (ref 1.4–7.7)
Neutrophils Relative %: 49.6 % (ref 43.0–77.0)
RBC: 4.76 Mil/uL (ref 3.87–5.11)
RDW: 13.5 % (ref 11.5–14.6)
WBC: 5.8 10*3/uL (ref 4.5–10.5)

## 2012-06-04 LAB — HEPATIC FUNCTION PANEL
ALT: 32 U/L (ref 0–35)
AST: 27 U/L (ref 0–37)
Alkaline Phosphatase: 47 U/L (ref 39–117)
Bilirubin, Direct: 0.1 mg/dL (ref 0.0–0.3)
Total Bilirubin: 1.1 mg/dL (ref 0.3–1.2)

## 2012-06-04 LAB — BASIC METABOLIC PANEL
BUN: 14 mg/dL (ref 6–23)
Chloride: 103 mEq/L (ref 96–112)
GFR: 90.99 mL/min (ref >60.00)
Potassium: 4.3 mEq/L (ref 3.5–5.1)
Sodium: 138 mEq/L (ref 135–145)

## 2012-06-04 LAB — LIPID PANEL
HDL: 44.5 mg/dL (ref >39.00)
Total CHOL/HDL Ratio: 4
VLDL: 46.2 mg/dL — ABNORMAL HIGH (ref 0.0–40.0)

## 2012-06-05 LAB — LDL CHOLESTEROL, DIRECT: Direct LDL: 111.5 mg/dL

## 2012-06-11 DIAGNOSIS — M5137 Other intervertebral disc degeneration, lumbosacral region: Secondary | ICD-10-CM | POA: Diagnosis not present

## 2012-06-11 DIAGNOSIS — M545 Low back pain: Secondary | ICD-10-CM | POA: Diagnosis not present

## 2012-06-18 DIAGNOSIS — M545 Low back pain: Secondary | ICD-10-CM | POA: Diagnosis not present

## 2012-06-18 DIAGNOSIS — M5137 Other intervertebral disc degeneration, lumbosacral region: Secondary | ICD-10-CM | POA: Diagnosis not present

## 2012-06-24 ENCOUNTER — Other Ambulatory Visit: Payer: Self-pay | Admitting: Pulmonary Disease

## 2012-06-25 ENCOUNTER — Telehealth: Payer: Self-pay | Admitting: Pulmonary Disease

## 2012-06-25 DIAGNOSIS — R928 Other abnormal and inconclusive findings on diagnostic imaging of breast: Secondary | ICD-10-CM | POA: Diagnosis not present

## 2012-06-25 NOTE — Telephone Encounter (Signed)
Called and spoke with pt and she is aware of appt with SN on 11-18 at 67.  Nothing further is needed.

## 2012-06-25 NOTE — Telephone Encounter (Signed)
Called and spoke with pt and she stated she had a mammogram in august.  In her right arm pit there were some abnormal lymph nodes noted on the mammogram and did an ultrasound as well.  Pt was told to come back in 3 months.  She stated that this was done today.  She stated there was no change in the lymph nodes and this was still abnormal.   She was told to follow up with her PCP to see if her other lymph nodes could be checked or what she should do to follow up on this.  Pt stated that she was concerned about this.   Is wanting SN recs on this.

## 2012-06-25 NOTE — Telephone Encounter (Signed)
Closed in error.  Pt stated that she is to return in 3 months for recheck.

## 2012-06-28 ENCOUNTER — Encounter: Payer: Self-pay | Admitting: *Deleted

## 2012-07-01 ENCOUNTER — Ambulatory Visit (INDEPENDENT_AMBULATORY_CARE_PROVIDER_SITE_OTHER): Payer: Medicare Other | Admitting: Pulmonary Disease

## 2012-07-01 ENCOUNTER — Encounter: Payer: Self-pay | Admitting: Pulmonary Disease

## 2012-07-01 ENCOUNTER — Ambulatory Visit (INDEPENDENT_AMBULATORY_CARE_PROVIDER_SITE_OTHER)
Admission: RE | Admit: 2012-07-01 | Discharge: 2012-07-01 | Disposition: A | Discharge status: Home / Self Care | Payer: Medicare Other | Source: Ambulatory Visit | Attending: Pulmonary Disease | Admitting: Pulmonary Disease

## 2012-07-01 VITALS — BP 128/82 | HR 78 | Temp 97.0°F | Ht 67.5 in | Wt 171.0 lb

## 2012-07-01 DIAGNOSIS — I679 Cerebrovascular disease, unspecified: Secondary | ICD-10-CM

## 2012-07-01 DIAGNOSIS — R599 Enlarged lymph nodes, unspecified: Secondary | ICD-10-CM

## 2012-07-01 DIAGNOSIS — E039 Hypothyroidism, unspecified: Secondary | ICD-10-CM

## 2012-07-01 DIAGNOSIS — I10 Essential (primary) hypertension: Secondary | ICD-10-CM

## 2012-07-01 DIAGNOSIS — F411 Generalized anxiety disorder: Secondary | ICD-10-CM

## 2012-07-01 DIAGNOSIS — E7800 Pure hypercholesterolemia, unspecified: Secondary | ICD-10-CM | POA: Diagnosis not present

## 2012-07-01 NOTE — Progress Notes (Signed)
Subjective:    Patient ID: Debra Mcintyre, female    DOB: 04-29-1941, 71 y.o.   MRN: [id number]  HPI 71 y/o WF here for a follow up visit... she has multiple medical problems as noted below...   ~  August 29, 2010:  Yearly check up- doing well overall w/o new complaints or concerns... had URI/ sinus 11/11 Rx'd w/ Augmentin, Mucinex, Saline> resolved... no change in meds- tol well, etc... she notes wt down 5# w/ diet + exercise... BP controlled on meds;  no cerebral ischemic symptoms on the ASA/ Plavix;  Chol controlled on Lip80 + diet;  Thyroid sl over-replaced on the [id number] but she doesn't want to decr dose w/ energy good, wt down, etc;  she notes that DrCousins keeps up w/ her BMD & Rx w/ calcium, MVI;  she remains on Effexor75mg /d "it keeps me on an even keel";  still refuses Flu vaccine.  ~  April 21, 2011:  Add-on appt for refractory asthmatic bronchitis>    She notes >101mo hx cough, congestion, thick beige sputum, and assoc SOB/ wheezing/ etc;  Seen by TP w/ bronchitis 8/20 & treated w/ Augmentin, Pred taper, Mucinex, Hydromet;  Improved but not resolved & recently experienced incr dyspnea, wheezing, chest congestion;  Denies f/c/s, no hemoptysis, min CP just from the coughing, not resting well...    Other medical issues appear stable> BP controlled on Metoprolol & Lisinopril/HCT;  Cerebrovasc dis stable on ASA/ Plavix;  Lipids controlled on diet + Lip80;  Thyroid remains regulated & clinically euthyroid on 145mcg/d;  Anxiety controlled on Effexor...    CXR 04/03/11 reviewed> Clear, NAD;  PFT today showed FVC=2.75 (82%), FEV1=2.15 (84%), %1sec=78, Mid-flows=90%pred...    We decided to treat w/ DepoMedrol, PREDNISONE 4d-tapering sched, ADVAIR 250 Bid sampler; MUCINEX 2Bid, Fluids, & TUSSIONEX; rov 2-3 wks.  ~  June 12, 2011:  6wk ROV & she reports that her prev AB is much improved/ resolved; she has min residual AM cough/ phlegm & denies chest tightness, wheezing, congestion, etc;  She  has finished the Pred, stopped the Advair & Mucinex, and uses the Tussionex as needed; I indicated to her that we might need to restart the Advair & Mucinex if symptoms recur...    NEW PROB = Chest Pain >> she describes what she called "heartburn" by which she means a CP= dull pressure sensation & tightness substernally that grabs her & occas radiates to the jaw; denies N V diaphoresis SOB or radiation to the arms etc; the pain occurs about once every 2wks on average usually while hurrying about but occas at rest; she says it will dissipate after GasX in about , but goes away after if she takes an aspirin;  She has no known hx heart disease, but risk factors include +FamHx (Father w/ hrt dis but died age 68), HBP, Chol, remote smoker...  EKG shows rsr' c/w IVCD, leftward axis, NSR, no acute STTWA...  We discussed referral to Cards for screening eval==> she requests [facility].  ~  May 28, 2012:  Yearly ROV & Debra Mcintyre has been stable, no new complaints or concerns except for some paresthesia in fingers & toes that she believes is due to Lip80, and she wants to change meds... She has 5 grandchildren & the oldest grand daughter received a [employer] schlorship...    HBP> on MetopER100, LisinHCT20-12.5; BP= 104/68 & she denies CP, palpit, SOB, edema; we never received any data from [facility]...    Cerebrovasc Dis> on ASA325 & Plavix75;  s/p aneurysm & TIA; stable w/o cerebral ischemic symptoms- she has not had f/u DrDeveshwar,IR- she notes she was told no need for f/u angiography (we might consider MRI later)...    Chol> on Lip80 but she says only taking intermittently due to tingling in fingers & toes she feels is due to this- FLP showed TChol 189, TG 231, HDL 45, LDL 112; we reviewed low fat wt reducing diet, & decided to change to CRESTOR20/d...    Hypothyroid> on Synthroid 168mcg/d; TSH= 1.20& she is clinically euthyroid...    Colon polyps> last colon was 11/10 w/ one adenomatous polyp removed, f/u  planned 30yrs.    DJD, FM, Osteop> on Calcium, MVI, OTC analgesics; BMD followed by her GYN...    Anxiety> on EffexorXR75 and she reports stable on this med...  We reviewed prob list, meds, xrays and labs> see below for updates >> OK Flu vaccine today... LABS 10/13:  FLP- not at goals on intermit Lip80;  Chems- wnl;  CBC- wnl;  TSH=1.20  ~  July 01, 2012:  Add-on appt for "lymph node"> pt had mammogram 8/13 at [facility] which is reported neg- no lesions identified but Ultrasound showed a right axillary lymph node; a repeat sonar done last week confirmed the right axillary adenopathy & showed no change; she has been quite anxious about this & set up this appt to find out what is causing the LN enlargement;  She feels well, appetite good, wt stable; she denies any f/c/s/ etc; ?sm amt local hidradenitis? no other obvious adenopathy or swelling- she does have a soft tissue knot in post aspect of right shoulder that she says is unchanged from her childhood & she actually saw an orthopedist in [place] about this some yrs ago ? etiology (they left it alone, no bx etc)...     Exam reveals a sm nodule deep in right axilla, no nodules on left, no other superficial adenopathy palpated...    We discussed further eval w/ CXR (last film 8/12 was neg- NAD), and CT Chest to check the lungs, mediastinum, & the axilla; NOTE: labs 10/13 were OK... CXR 11/13 showed norm heart size, min incr interstitial markings (no change), scoliosis... CT Chest 11/13 showed: 1- "min prominent" right axillary LN, <2cm size, Rec f/u sonar;  2- sm nodules, largest 4-75mm in post RLL (report says LLL). Rec f/u CT 869mo;  3- <2cm cystic pancreatic lesion (they rec MRI in 3yr);  4- scoliosis w/ mild to mod spondylosis... We discussed right axillary sonar in 3 months and CT Chest w/ contrast in 6 months...          Problem List:  NEW PROBLEM 11/13 >> Right axillary LN found on Mammogram/ Ultrasound> Subsequent CT Chest w/ mult incidental  findings>  ~  CXR 11/13 showed norm heart size, min incr interstitial markings (no change), scoliosis... ~  CT Chest 11/13 showed: 1- "min prominent" right axillary LN, <2cm size, Rec f/u sonar;  2- sm nodules, largest 4-73mm in post RLL (report says LLL). Rec f/u CT 869mo;  3- <2cm cystic pancreatic lesion (they rec MRI in 27yr);  4- scoliosis w/ mild to mod spondylosis... ~  We discussed right axillary sonar in 3 months and CT Chest w/ contrast in 6 months...   HYPERTENSION (ICD-401.9) - on TOPROL XL 100mg /d,  LISINOPRIL/ Hct 20/12.5 daily... BP= 114/68 today and she says similar readings at home... takes meds regularly & tol well... denies HA, fatigue, visual changes, CP, palipit, dizziness, syncope, dyspnea, edema,  etc... she does water aerobics & walks for exercise...  ~  CXR 8/12 showed normal heart size, clear lungs, scoliosis, NAD... ~  10/12:  Presents w/ chest discomfort> EKG showed NSR, rate 69, rsr' in V1-2, otherw wnl; referred to Cards=> she requests DrKelly.  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 she's been stable since that time w/ out pt f/u by IR, [name]> she indicates he said no further studies needed. ~  CDopplers 5/04 showed mild plaque, no signif ICA stenoses... ~  MR studies 10/09 showed sm vessel dis; poss restenosis left MCA w/ angiogram rec- but wasn't done; mod stenosis at origin of left vertebral art w/ tortuosity... 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 ~  10/12:  she remains asymptomatic- w/o cerebral ischemic symptoms...  HYPERCHOLESTEROLEMIA (ICD-272.0) - on LIPITOR 80mg /d (she stopped prev FishOil  supplements)> see below... ~  FLP 3/08 shows Tchol 180, TG 206, HDL 49, LDL 105... rec same meds, better diet, get wt down. ~  FLP 10/09 showed TChol 150, TG 178, HDL 41, LDL 74 ~  FLP 1/11 on Lip80+Zetia10 showed TChol 162, TG 226, HDL 47, LDL 95... she wants to stop Zetia, get on diet & get wt down. ~  FLP 1/12 on Lip80 showed TChol 173, TG 164, HDL 45, LDL 95 ~  FLP 10/13 on Lip80 intermittently showed TChol 189, TG 231, HDL 45, LDL 112... She was c/o paresthesias that she was convinced was due to the Lip80 & she requested change to CRESTOR 20mg /d...  HYPOTHYROIDISM (ICD-244.9) - on SYNTHROID 168mcg/d... ~  labs 3/08 showed TSH = 0.23 ~  labs 10/09 showed TSH= 0.64 ~  labs 1/11 showed TSH= 0.18... she wants to keep same dose to aide wt reduction. ~  labs 1/12 showed TSH= 0.20... 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 f/u planned 17yrs. ~  f/u colonoscopy 11/10 showed 2 polyps- one adenomatous w/ f/u planned 35yrs.  DEGENERATIVE JOINT DISEASE (ICD-715.90) - uses Tylenol & OTC meds as needed... in 2004 seen by [facility] w/ soft tissue hemangioma found in right shoulder area... second opinion from DrWWard @  [facility] confirmed this- no surg necessary... ~  1/11:  notes some pain in hands & wrist... ~  1/12:  Stable w/o acute complaints or problem areas...  FIBROMYALGIA (ICD-729.1)  OSTEOPOROSIS (ICD-733.00) - she indicates that this is followed & managed by GYN, DrCousins> ?when last BMD was done "she keeps up w/ this" > on Calcium, & Vitamins... ~  labs 1/11 showed Vit D level = 54...  TIA (ICD-435.9) - as above, she remains on ASA325 & PLAVIX75... last saw DrReynolds in 2006... 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 arteriogram by Dr. Mamie Nick confirmed a web-like plaque in the left M1 segment of the left MCA with  signif stenosis...  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 subseq PTA of left MCA w/ good result- resid 20% stenosis seen on f/u angiograms along w/ a 1mm saccular  aneurysm seen in the right MCA trifurcation...  ANXIETY (ICD-300.00) - on EFFEXOR 75mg /d for hot flashes, she says.... she wishes to continue the med "it keeps me on an even keel".  Health Maintenance - GYN= DrCousins & she will call for f/u... Mammograms at [facility]... BMDs at [facility] & results sent to DrCousins... ~  Immunizations: she refuses Flu vaccine... given PNEUMOVAX- 1/11, and Tdap- 1/11...   Past Surgical History  Procedure Date  . Left middle cerebral artery angioplasty 2004    by [facility]    Outpatient Encounter Prescriptions as of 07/01/2012  Medication Sig Dispense Refill  . aspirin 325 MG tablet Take 325 mg by mouth daily.        . Calcium Carbonate-Vitamin D (CALCIUM 600+D) 600-200 MG-UNIT TABS Take 1 tablet by mouth 2 (two) times daily.        Marland Kitchen levothyroxine (SYNTHROID, LEVOTHROID) 125 MCG tablet Take 1 tablet (125 mcg total) by mouth daily.  30 tablet  11  . lisinopril-hydrochlorothiazide (PRINZIDE) 20-12.5 MG per tablet Take 1 tablet by mouth daily.  30 tablet  11  . metoprolol succinate (TOPROL-XL) 100 MG 24 hr tablet TAKE ONE TABLET BY MOUTH EVERY DAY  30 tablet  11  . Multiple Vitamin (MULTIVITAMIN) capsule Take 1 capsule by mouth daily.        Marland Kitchen PLAVIX 75 MG tablet TAKE ONE TABLET BY MOUTH EVERY DAY  30 each  11  . rosuvastatin (CRESTOR) 20 MG tablet Take 1 tablet (20 mg total) by mouth daily.  30 tablet  11  . venlafaxine XR (EFFEXOR-XR) 75 MG 24 hr capsule TAKE ONE CAPSULE BY MOUTH EVERY DAY  30 capsule  1  . [DISCONTINUED] lisinopril-hydrochlorothiazide (PRINZIDE,ZESTORETIC) 20-12.5 MG per tablet TAKE ONE TABLET BY MOUTH ONCE A DAY-NEEDS TO BE SEEN  30 tablet  11  . [DISCONTINUED] Omega-3 Fatty Acids (FISH OIL) 1200 MG CAPS Take 1 capsule by  mouth 2 (two) times daily.          No Known Allergies   Current Medications, Allergies, Past Medical History, Past Surgical History, Family History, and Social History were reviewed in [employer] record.    Review of Systems        The patient complains of dyspnea on exertion, back pain, joint pain, and arthritis.  The patient denies fever, chills, sweats, anorexia, fatigue, weakness, malaise, weight loss, sleep disorder, blurring, diplopia, eye irritation, eye discharge, vision loss, eye pain, photophobia, earache, ear discharge, tinnitus, decreased hearing, nasal congestion, nosebleeds, sore throat, hoarseness, chest pain, palpitations, syncope, orthopnea, PND, peripheral edema, cough, dyspnea at rest, excessive sputum, hemoptysis, wheezing, pleurisy, nausea, vomiting, diarrhea, constipation, change in bowel habits, abdominal pain, melena, hematochezia, jaundice, gas/bloating, indigestion/heartburn, dysphagia, odynophagia, dysuria, hematuria, urinary frequency, urinary hesitancy, nocturia, incontinence, joint swelling, muscle cramps, muscle weakness, stiffness, sciatica, restless legs, leg pain at night, leg pain with exertion, rash, itching, dryness, suspicious lesions, paralysis, paresthesias, seizures, tremors, vertigo, transient blindness, frequent falls, frequent headaches, difficulty walking, depression, anxiety, memory loss, confusion, cold intolerance, heat intolerance, polydipsia, polyphagia, polyuria, unusual weight change, abnormal bruising, bleeding, enlarged lymph nodes, urticaria, allergic rash, hay fever, and recurrent infections.     Objective:   Physical Exam      WD, WN, 71 y/o WF in NAD... GENERAL:  Alert & oriented; pleasant & cooperative... HEENT:  [place]/AT, EOM-wnl, PERRLA, EACs-clear, TMs-wnl, NOSE-clear, THROAT-clear & wnl. NECK:  Supple w/ fairROM; no JVD; normal carotid impulses w/o bruits; no thyromegaly or nodules palpated; no  lymphadenopathy. CHEST:  Bibasilar wheezing & rhonchi w/ congested cough but no rales or signs of consolidation... HEART:  Regular Rhythm; without murmurs/ rubs/ or gallops. ABDOMEN:  Soft & nontender; normal bowel sounds; no organomegaly or masses palpated. EXT: without deformities, mild arthritic changes; no varicose veins/ +venous insuffic/ no edema. NEURO:  CN's intact;  no focal neuro deficits... DERM:  No lesions noted; no rash etc...  RADIOLOGY DATA:  Reviewed in the EPIC EMR & discussed w/ the patient...  LABORATORY DATA:  Reviewed in the EPIC EMR & discussed w/ the patient...   Assessment & Plan:    Right AXILLARY LN & Abn CT CHEST >> see above- plan is for f/u right axillary sonar in 3 mo & CT Chest w/ contrast in 6 mo...  ASTHMATIC BRONCHITIS>  No recent URIs or resp exac & she is once again not on regular meds, denies breathing problems, etc...  HBP>  Controlled on BBlocker, & ACE/diuretic, continue same...  Cerebrovasc Dis>  She had TIA 2004 w/ eval revealing left MCA stenosis; DrDeveshwar did PTA w/ good result; incidental 1-32mm aneurysm noted in the right MCA; she has remained asymptomatic since 2004 on the ASA/ Plavix.  CHOL>  Unable to tol Lip80 due to paresthesias & wants to change to Crestor- we will start w/ 20mg  & f/u FLP on this...  HPYOTHYROID>  On Synthroid 114mcg/d w/ TSH wnl now, continue same...  Other medical problems as noted...   Patient's Medications  New Prescriptions   No medications on file  Previous Medications   ASPIRIN 325 MG TABLET    Take 325 mg by mouth daily.     CALCIUM CARBONATE-VITAMIN D (CALCIUM 600+D) 600-200 MG-UNIT TABS    Take 1 tablet by mouth 2 (two) times daily.     LEVOTHYROXINE (SYNTHROID, LEVOTHROID) 125 MCG TABLET    Take 1 tablet (125 mcg total) by mouth daily.   LISINOPRIL-HYDROCHLOROTHIAZIDE (PRINZIDE) 20-12.5 MG PER TABLET    Take 1 tablet by mouth daily.   METOPROLOL SUCCINATE (TOPROL-XL) 100 MG 24 HR TABLET    TAKE  ONE TABLET BY MOUTH EVERY DAY   MULTIPLE VITAMIN (MULTIVITAMIN) CAPSULE    Take 1 capsule by mouth daily.     PLAVIX 75 MG TABLET    TAKE ONE TABLET BY MOUTH EVERY DAY   ROSUVASTATIN (CRESTOR) 20 MG TABLET    Take 1 tablet (20 mg total) by mouth daily.   VENLAFAXINE XR (EFFEXOR-XR) 75 MG 24 HR CAPSULE    TAKE ONE CAPSULE BY MOUTH EVERY DAY  Modified Medications   No medications on file  Discontinued Medications   LISINOPRIL-HYDROCHLOROTHIAZIDE (PRINZIDE,ZESTORETIC) 20-12.5 MG PER TABLET    TAKE ONE TABLET BY MOUTH ONCE A DAY-NEEDS TO BE SEEN   OMEGA-3 FATTY ACIDS (FISH OIL) 1200 MG CAPS    Take 1 capsule by mouth 2 (two) times daily.

## 2012-07-01 NOTE — Patient Instructions (Addendum)
Today we updated your med list in our EPIC system...    Continue your current medications the same...  We discussed further evaluation of there Lymph node detected on the sonar at [facility]:    Today we did your follow up CXR...    We will schedule a CT scan of your chest & we will call you w/ the report when avail...    We can then decide where to go from there...  Call for any questions or concerns.Marland KitchenMarland Kitchen

## 2012-07-03 ENCOUNTER — Telehealth: Payer: Self-pay | Admitting: Pulmonary Disease

## 2012-07-03 NOTE — Progress Notes (Signed)
Quick Note:  ATC pt, no answer. LMOMTCB ______ 

## 2012-07-03 NOTE — Telephone Encounter (Signed)
Forward 2 pages from [facility] to Dr. Alroy Dust for review on 07-03-12 ym

## 2012-07-05 ENCOUNTER — Telehealth: Payer: Self-pay | Admitting: Pulmonary Disease

## 2012-07-05 NOTE — Telephone Encounter (Signed)
Notes Recorded by [name], MD on 07/02/2012 at 7:20 AM Please notify patient> Awaiting CT Chest... CXR is stable, NAD; +scoliosis & some mild fibrosis, NAD...  Spoke with pt and notified of results per Dr. Kriste Basque. Pt verbalized understanding and denied any questions.

## 2012-07-05 NOTE — Progress Notes (Signed)
Quick Note:  Spoke with pt and notified of results per Dr. Nadel. Pt verbalized understanding and denied any questions.  ______ 

## 2012-07-08 ENCOUNTER — Ambulatory Visit (INDEPENDENT_AMBULATORY_CARE_PROVIDER_SITE_OTHER)
Admission: RE | Admit: 2012-07-08 | Discharge: 2012-07-08 | Disposition: A | Discharge status: Home / Self Care | Payer: Medicare Other | Source: Ambulatory Visit | Attending: Pulmonary Disease | Admitting: Pulmonary Disease

## 2012-07-08 DIAGNOSIS — J984 Other disorders of lung: Secondary | ICD-10-CM | POA: Diagnosis not present

## 2012-07-08 DIAGNOSIS — R599 Enlarged lymph nodes, unspecified: Secondary | ICD-10-CM | POA: Diagnosis not present

## 2012-07-08 MED ORDER — IOHEXOL 300 MG/ML  SOLN
80.0000 mL | Freq: Once | INTRAMUSCULAR | Status: AC | PRN
  Administered 2012-07-08: 80 mL via INTRAVENOUS

## 2012-07-16 ENCOUNTER — Other Ambulatory Visit: Payer: Self-pay | Admitting: Pulmonary Disease

## 2012-07-16 DIAGNOSIS — R599 Enlarged lymph nodes, unspecified: Secondary | ICD-10-CM

## 2012-07-17 ENCOUNTER — Other Ambulatory Visit: Payer: Self-pay | Admitting: Pulmonary Disease

## 2012-07-17 DIAGNOSIS — R599 Enlarged lymph nodes, unspecified: Secondary | ICD-10-CM

## 2012-07-17 DIAGNOSIS — R928 Other abnormal and inconclusive findings on diagnostic imaging of breast: Secondary | ICD-10-CM

## 2012-08-26 ENCOUNTER — Other Ambulatory Visit: Payer: Self-pay | Admitting: Pulmonary Disease

## 2012-09-14 DIAGNOSIS — C50919 Malignant neoplasm of unspecified site of unspecified female breast: Secondary | ICD-10-CM

## 2012-09-14 HISTORY — DX: Malignant neoplasm of unspecified site of unspecified female breast: C50.919

## 2012-09-25 ENCOUNTER — Ambulatory Visit
Admission: RE | Admit: 2012-09-25 | Discharge: 2012-09-25 | Disposition: A | Discharge status: Home / Self Care | Payer: Medicare Other | Source: Ambulatory Visit | Attending: Pulmonary Disease | Admitting: Pulmonary Disease

## 2012-09-25 DIAGNOSIS — R599 Enlarged lymph nodes, unspecified: Secondary | ICD-10-CM

## 2012-09-25 DIAGNOSIS — R928 Other abnormal and inconclusive findings on diagnostic imaging of breast: Secondary | ICD-10-CM

## 2012-10-06 ENCOUNTER — Other Ambulatory Visit: Payer: Self-pay | Admitting: Pulmonary Disease

## 2012-10-07 ENCOUNTER — Other Ambulatory Visit: Payer: Self-pay | Admitting: Pulmonary Disease

## 2012-10-07 ENCOUNTER — Ambulatory Visit
Admission: RE | Admit: 2012-10-07 | Discharge: 2012-10-07 | Disposition: A | Discharge status: Home / Self Care | Payer: Medicare Other | Source: Ambulatory Visit | Attending: Pulmonary Disease | Admitting: Pulmonary Disease

## 2012-10-07 DIAGNOSIS — R928 Other abnormal and inconclusive findings on diagnostic imaging of breast: Secondary | ICD-10-CM

## 2012-10-07 DIAGNOSIS — C773 Secondary and unspecified malignant neoplasm of axilla and upper limb lymph nodes: Secondary | ICD-10-CM | POA: Diagnosis not present

## 2012-10-07 DIAGNOSIS — R599 Enlarged lymph nodes, unspecified: Secondary | ICD-10-CM | POA: Diagnosis not present

## 2012-10-07 DIAGNOSIS — N6489 Other specified disorders of breast: Secondary | ICD-10-CM | POA: Diagnosis not present

## 2012-10-08 ENCOUNTER — Other Ambulatory Visit (HOSPITAL_COMMUNITY): Payer: Self-pay | Admitting: Diagnostic Radiology

## 2012-10-08 ENCOUNTER — Other Ambulatory Visit: Payer: Self-pay | Admitting: Pulmonary Disease

## 2012-10-08 ENCOUNTER — Ambulatory Visit
Admission: RE | Admit: 2012-10-08 | Discharge: 2012-10-08 | Disposition: A | Discharge status: Home / Self Care | Payer: Medicare Other | Source: Ambulatory Visit | Attending: Pulmonary Disease | Admitting: Pulmonary Disease

## 2012-10-08 ENCOUNTER — Telehealth: Payer: Self-pay | Admitting: Pulmonary Disease

## 2012-10-08 DIAGNOSIS — R921 Mammographic calcification found on diagnostic imaging of breast: Secondary | ICD-10-CM

## 2012-10-08 DIAGNOSIS — R928 Other abnormal and inconclusive findings on diagnostic imaging of breast: Secondary | ICD-10-CM | POA: Diagnosis not present

## 2012-10-08 DIAGNOSIS — C50919 Malignant neoplasm of unspecified site of unspecified female breast: Secondary | ICD-10-CM | POA: Diagnosis not present

## 2012-10-08 HISTORY — PX: BREAST BIOPSY: SHX20

## 2012-10-08 NOTE — Telephone Encounter (Signed)
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

## 2012-10-08 NOTE — Telephone Encounter (Signed)
I called pt & reviewed her findings in EPIC> she requested names for Oncologists & I mentioned DrMagrinat & DrLivesay... [facility] results of biopsies...  SMN

## 2012-10-09 ENCOUNTER — Ambulatory Visit
Admission: RE | Admit: 2012-10-09 | Discharge: 2012-10-09 | Disposition: A | Discharge status: Home / Self Care | Payer: Medicare Other | Source: Ambulatory Visit | Attending: Pulmonary Disease | Admitting: Pulmonary Disease

## 2012-10-09 ENCOUNTER — Other Ambulatory Visit: Payer: Self-pay | Admitting: Pulmonary Disease

## 2012-10-09 ENCOUNTER — Telehealth: Payer: Self-pay | Admitting: Pulmonary Disease

## 2012-10-09 DIAGNOSIS — R921 Mammographic calcification found on diagnostic imaging of breast: Secondary | ICD-10-CM

## 2012-10-09 DIAGNOSIS — C50911 Malignant neoplasm of unspecified site of right female breast: Secondary | ICD-10-CM

## 2012-10-09 DIAGNOSIS — R928 Other abnormal and inconclusive findings on diagnostic imaging of breast: Secondary | ICD-10-CM | POA: Diagnosis not present

## 2012-10-09 NOTE — Telephone Encounter (Signed)
Form has been signed and faxed back to the [facility].  i called and lmom for Debra Mcintyre to let her know that this has been faxed back and ok per SN to order the MRI of the breast.  Nothing further is needed.

## 2012-10-09 NOTE — Telephone Encounter (Signed)
i have the form that SN will need to sign and will fax this back .   SN please advise about the order for the MRI of the breast.  thanks

## 2012-10-10 ENCOUNTER — Telehealth: Payer: Self-pay | Admitting: *Deleted

## 2012-10-10 NOTE — Telephone Encounter (Signed)
Received referral from [facility] for pt to come to [facility].  Pt decided to chose her own team.  Scheduled appt with Dr. Dwain Sarna on 10/11/12 at 3:30.  Pt request to see Dr. Darnelle Catalan and Dr. Dayton Scrape as well.

## 2012-10-11 ENCOUNTER — Encounter (INDEPENDENT_AMBULATORY_CARE_PROVIDER_SITE_OTHER): Payer: Self-pay | Admitting: General Surgery

## 2012-10-11 ENCOUNTER — Ambulatory Visit (INDEPENDENT_AMBULATORY_CARE_PROVIDER_SITE_OTHER): Payer: Medicare Other | Admitting: General Surgery

## 2012-10-11 VITALS — BP 150/78 | HR 88 | Resp 18 | Ht 68.0 in | Wt 166.0 lb

## 2012-10-11 DIAGNOSIS — C50419 Malignant neoplasm of upper-outer quadrant of unspecified female breast: Secondary | ICD-10-CM

## 2012-10-11 DIAGNOSIS — C50411 Malignant neoplasm of upper-outer quadrant of right female breast: Secondary | ICD-10-CM

## 2012-10-13 NOTE — Progress Notes (Signed)
Patient ID: Debra Mcintyre, female   DOB: [birth year], 72 y.o.   MRN: [id number]  Chief Complaint  Patient presents with  . Other    Eval new br cancer    HPI Debra Mcintyre is a 72 y.o. female.  Referred by Dr. Alroy Dust HPI 89 yof who underwent had possible right sided adenopathy that were slightly enlarged seen previously that were followed until recently.  She recently underwent diagnostic right mm and u/s at breast center.  On the right side there are developing pleomorphic calcifications spanning area measuring 6.3x4.7x5.1 cm in transverse, anterior posterior and longitudinal dimension mostly centered in the upper and medial aspect of the right breast.  There is no associated mass.  There are prominent right axillary nodes noted.  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  She has mr scheduled.  She comes in today without any breast complaints to discuss options.  Past Medical History  Diagnosis Date  . Hypertension   . Cerebrovascular disease, unspecified   . Cerebral aneurysm, nonruptured   . Pure hypercholesterolemia   . Hypothyroidism   . Colonic polyp   . Hemorrhoid   . Degenerative joint disease   . Fibromyalgia   . Osteoporosis   . TIA (transient ischemic attack)   . Anxiety     Past Surgical History  Procedure Laterality Date  . Left middle cerebral artery angioplasty  2004    by [name]    Family History  Problem Relation Age of Onset  . Lung cancer Mother   . Heart disease Father   . Colon cancer Brother   . Alcohol abuse Brother     Social History History  Substance Use Topics  . Smoking status: Former Smoker -- 1.00 packs/day for 20 years    Types: Cigarettes    Quit date: 04/02/1986  . Smokeless tobacco: Not on file  . Alcohol Use: No    No Known Allergies  Current Outpatient Prescriptions  Medication Sig Dispense Refill  . aspirin 325 MG tablet Take 325 mg by  mouth daily.        . Calcium Carbonate-Vitamin D (CALCIUM 600+D) 600-200 MG-UNIT TABS Take 1 tablet by mouth 2 (two) times daily.        . clopidogrel (PLAVIX) 75 MG tablet TAKE ONE TABLET BY MOUTH EVERY DAY  30 tablet  6  . levothyroxine (SYNTHROID, LEVOTHROID) 125 MCG tablet Take 1 tablet (125 mcg total) by mouth daily.  30 tablet  11  . metoprolol succinate (TOPROL-XL) 100 MG 24 hr tablet TAKE ONE TABLET BY MOUTH EVERY DAY  30 tablet  11  . Multiple Vitamin (MULTIVITAMIN) capsule Take 1 capsule by mouth daily.        . rosuvastatin (CRESTOR) 20 MG tablet Take 1 tablet (20 mg total) by mouth daily.  30 tablet  11  . venlafaxine XR (EFFEXOR-XR) 75 MG 24 hr capsule Take 1 capsule (75 mg total) by mouth daily.  30 capsule  6  . lisinopril-hydrochlorothiazide (PRINZIDE) 20-12.5 MG per tablet Take 1 tablet by mouth daily.  30 tablet  11   No current facility-administered medications for this visit.    Review of Systems Review of Systems  Constitutional: Negative for fever, chills and unexpected weight change.  HENT: Negative for hearing loss, congestion, sore throat, trouble swallowing and voice change.   Eyes: Negative for visual disturbance.  Respiratory: Negative for cough and wheezing.   Cardiovascular: Negative for  chest pain, palpitations and leg swelling.  Gastrointestinal: Negative for nausea, vomiting, abdominal pain, diarrhea, constipation, blood in stool, abdominal distention and anal bleeding.  Genitourinary: Negative for hematuria, vaginal bleeding and difficulty urinating.  Musculoskeletal: Negative for arthralgias.  Skin: Negative for rash and wound.  Neurological: Negative for seizures, syncope and headaches.  Hematological: Negative for adenopathy. Does not bruise/bleed easily.  Psychiatric/Behavioral: Negative for confusion.    Blood pressure 150/78, pulse 88, resp. rate 18, height 5\' 8"  (1.727 m), weight 166 lb (75.297 kg).  Physical Exam Physical Exam  Vitals  reviewed. Constitutional: She appears well-developed and well-nourished.  Eyes: No scleral icterus.  Neck: Neck supple.  Cardiovascular: Normal rate, regular rhythm, normal heart sounds and intact distal pulses.   Pulmonary/Chest: Effort normal and breath sounds normal. She has no wheezes. She has no rales. Right breast exhibits no inverted nipple, no mass, no nipple discharge, no skin change and no tenderness. Left breast exhibits no inverted nipple, no mass, no nipple discharge, no skin change and no tenderness. Breasts are symmetrical.  Lymphadenopathy:    She has no cervical adenopathy.    Data Reviewed Mammogram, pathology reviewed  Assessment    Right breast invasive ductal cancer    Plan    MRI bilateral breasts, med onc/rad onc appt   We discussed the staging and pathophysiology of breast cancer. We discussed all of the different options for treatment for breast cancer including surgery, chemotherapy, radiation therapy, Herceptin, and antiestrogen therapy.   We discussed an axillary node dissection at time of surgery.  We discussed risks including shoulder pain, lymphedema. We discussed the options for treatment of the breast cancer which included lumpectomy versus a mastectomy. II think there is good chance she may need mastectomy.  We could discuss primary systemic therapy as possibility also. We discussed the performance of the lumpectomy with a wire placement. We discussed a 10-20% chance of a positive margin requiring reexcision in the operating room. We also discussed that she may need radiation therapy or antiestrogen therapy or both if she undergoes lumpectomy. We discussed the mastectomy and the postoperative care for that as well. We discussed that there is no difference in her survival whether she undergoes lumpectomy with radiation therapy or antiestrogen therapy versus a mastectomy. There is a slight difference in the local recurrence rate being 3-5% with lumpectomy and  about 1% with a mastectomy. We discussed the risks of operation including bleeding, infection, possible reoperation. She understands her further therapy will be based on what her stages at the time of her operation.         WAKEFIELD,MATTHEW 10/13/2012, 8:34 PM

## 2012-10-14 ENCOUNTER — Ambulatory Visit
Admission: RE | Admit: 2012-10-14 | Discharge: 2012-10-14 | Disposition: A | Discharge status: Home / Self Care | Payer: Medicare Other | Source: Ambulatory Visit | Attending: Pulmonary Disease | Admitting: Pulmonary Disease

## 2012-10-14 ENCOUNTER — Telehealth: Payer: Self-pay | Admitting: Oncology

## 2012-10-14 ENCOUNTER — Other Ambulatory Visit (INDEPENDENT_AMBULATORY_CARE_PROVIDER_SITE_OTHER): Payer: Self-pay | Admitting: General Surgery

## 2012-10-14 DIAGNOSIS — C50911 Malignant neoplasm of unspecified site of right female breast: Secondary | ICD-10-CM

## 2012-10-14 DIAGNOSIS — C50919 Malignant neoplasm of unspecified site of unspecified female breast: Secondary | ICD-10-CM | POA: Diagnosis not present

## 2012-10-14 DIAGNOSIS — C773 Secondary and unspecified malignant neoplasm of axilla and upper limb lymph nodes: Secondary | ICD-10-CM | POA: Diagnosis not present

## 2012-10-14 MED ORDER — GADOBENATE DIMEGLUMINE 529 MG/ML IV SOLN
15.0000 mL | Freq: Once | INTRAVENOUS | Status: AC | PRN
  Administered 2012-10-14: 15 mL via INTRAVENOUS

## 2012-10-14 NOTE — Telephone Encounter (Signed)
The patient left a message and I returned her call.   She wanted to schedule a consult with Dr. Darnelle Catalan- and I confirmed 10/29/12 with her.  Dr. Dwain Sarna called her with her MRI results and is scheduling an additional biopsy soon.    She may need neoadjuvant chemo.   The patient is happy with her scheduled appt and and will review information in her Journey.

## 2012-10-15 ENCOUNTER — Encounter: Payer: Self-pay | Admitting: *Deleted

## 2012-10-15 ENCOUNTER — Telehealth: Payer: Self-pay | Admitting: Oncology

## 2012-10-15 NOTE — Telephone Encounter (Signed)
I called the patient and she has filled out the intake form, and I asked her to bring it in for her appt with Dr. Darnelle Catalan.    She is doing well- she is glad to know the MRI was clean on her opposite breast and is looking at teh bright side of things.

## 2012-10-15 NOTE — Progress Notes (Signed)
Tami confirmed appt.  Mailed before appt letter & packet to pt.  Took paperwork to [facility] for chart.

## 2012-10-16 ENCOUNTER — Telehealth (INDEPENDENT_AMBULATORY_CARE_PROVIDER_SITE_OTHER): Payer: Self-pay

## 2012-10-16 ENCOUNTER — Other Ambulatory Visit (INDEPENDENT_AMBULATORY_CARE_PROVIDER_SITE_OTHER): Payer: Self-pay | Admitting: General Surgery

## 2012-10-16 DIAGNOSIS — C50911 Malignant neoplasm of unspecified site of right female breast: Secondary | ICD-10-CM

## 2012-10-16 NOTE — Telephone Encounter (Signed)
Debra Mcintyre requesting I place an order in epic for dg [facility] right after pt gets mri guided bx.

## 2012-10-17 ENCOUNTER — Telehealth: Payer: Self-pay | Admitting: Oncology

## 2012-10-17 ENCOUNTER — Other Ambulatory Visit: Payer: Self-pay | Admitting: Oncology

## 2012-10-17 DIAGNOSIS — C50911 Malignant neoplasm of unspecified site of right female breast: Secondary | ICD-10-CM

## 2012-10-17 NOTE — Telephone Encounter (Signed)
I called the patient to let her know that the physicians would like a PET scan.   Her case was discussed in breast conference.  Dr. Welton Flakes ordered for her partner Dr. Darnelle Catalan who is presently out of the office. She was not home so I asked her to call me back.  She has my direct number

## 2012-10-17 NOTE — Telephone Encounter (Signed)
lmonvm advising the pt of her pet scan appt on 10/24/2012@9 :30am

## 2012-10-21 ENCOUNTER — Other Ambulatory Visit: Payer: Self-pay | Admitting: *Deleted

## 2012-10-21 DIAGNOSIS — C50119 Malignant neoplasm of central portion of unspecified female breast: Secondary | ICD-10-CM | POA: Insufficient documentation

## 2012-10-21 DIAGNOSIS — C50111 Malignant neoplasm of central portion of right female breast: Secondary | ICD-10-CM

## 2012-10-23 ENCOUNTER — Ambulatory Visit
Admission: RE | Admit: 2012-10-23 | Discharge: 2012-10-23 | Disposition: A | Discharge status: Home / Self Care | Payer: Medicare Other | Source: Ambulatory Visit | Attending: General Surgery | Admitting: General Surgery

## 2012-10-23 DIAGNOSIS — C50911 Malignant neoplasm of unspecified site of right female breast: Secondary | ICD-10-CM

## 2012-10-23 DIAGNOSIS — C50919 Malignant neoplasm of unspecified site of unspecified female breast: Secondary | ICD-10-CM | POA: Diagnosis not present

## 2012-10-23 DIAGNOSIS — C50912 Malignant neoplasm of unspecified site of left female breast: Secondary | ICD-10-CM

## 2012-10-23 DIAGNOSIS — D0590 Unspecified type of carcinoma in situ of unspecified breast: Secondary | ICD-10-CM | POA: Diagnosis not present

## 2012-10-23 HISTORY — PX: BREAST BIOPSY: SHX20

## 2012-10-23 MED ORDER — GADOBENATE DIMEGLUMINE 529 MG/ML IV SOLN
15.0000 mL | Freq: Once | INTRAVENOUS | Status: AC | PRN
  Administered 2012-10-23: 15 mL via INTRAVENOUS

## 2012-10-24 ENCOUNTER — Encounter (HOSPITAL_COMMUNITY)
Admission: RE | Admit: 2012-10-24 | Discharge: 2012-10-24 | Disposition: A | Discharge status: Home / Self Care | Payer: Medicare Other | Source: Ambulatory Visit | Attending: Oncology | Admitting: Oncology

## 2012-10-24 ENCOUNTER — Encounter (HOSPITAL_COMMUNITY): Payer: Self-pay

## 2012-10-24 DIAGNOSIS — C50119 Malignant neoplasm of central portion of unspecified female breast: Secondary | ICD-10-CM | POA: Diagnosis not present

## 2012-10-24 DIAGNOSIS — I2510 Atherosclerotic heart disease of native coronary artery without angina pectoris: Secondary | ICD-10-CM | POA: Insufficient documentation

## 2012-10-24 DIAGNOSIS — C50919 Malignant neoplasm of unspecified site of unspecified female breast: Secondary | ICD-10-CM | POA: Diagnosis not present

## 2012-10-24 DIAGNOSIS — I700 Atherosclerosis of aorta: Secondary | ICD-10-CM | POA: Diagnosis not present

## 2012-10-24 DIAGNOSIS — C50911 Malignant neoplasm of unspecified site of right female breast: Secondary | ICD-10-CM

## 2012-10-24 LAB — GLUCOSE, CAPILLARY: Glucose-Capillary: 89 mg/dL (ref 70–99)

## 2012-10-24 MED ORDER — FLUDEOXYGLUCOSE F - 18 (FDG) INJECTION
17.6000 | Freq: Once | INTRAVENOUS | Status: AC | PRN
  Administered 2012-10-24: 17.6 via INTRAVENOUS

## 2012-10-25 ENCOUNTER — Telehealth (INDEPENDENT_AMBULATORY_CARE_PROVIDER_SITE_OTHER): Payer: Self-pay

## 2012-10-25 NOTE — Telephone Encounter (Signed)
LMOM giving pt an appt for next Friday 11/01/12 arrive at 8:00am with Dr Dwain Sarna.

## 2012-10-29 ENCOUNTER — Ambulatory Visit (HOSPITAL_BASED_OUTPATIENT_CLINIC_OR_DEPARTMENT_OTHER): Payer: Medicare Other | Admitting: Oncology

## 2012-10-29 ENCOUNTER — Ambulatory Visit: Payer: Medicare Other

## 2012-10-29 ENCOUNTER — Other Ambulatory Visit (HOSPITAL_BASED_OUTPATIENT_CLINIC_OR_DEPARTMENT_OTHER): Payer: Medicare Other | Admitting: Lab

## 2012-10-29 VITALS — BP 163/74 | HR 80 | Temp 98.1°F | Resp 20 | Ht 68.0 in | Wt 168.6 lb

## 2012-10-29 DIAGNOSIS — C773 Secondary and unspecified malignant neoplasm of axilla and upper limb lymph nodes: Secondary | ICD-10-CM | POA: Diagnosis not present

## 2012-10-29 DIAGNOSIS — Z171 Estrogen receptor negative status [ER-]: Secondary | ICD-10-CM

## 2012-10-29 DIAGNOSIS — D0590 Unspecified type of carcinoma in situ of unspecified breast: Secondary | ICD-10-CM | POA: Diagnosis not present

## 2012-10-29 DIAGNOSIS — C50119 Malignant neoplasm of central portion of unspecified female breast: Secondary | ICD-10-CM

## 2012-10-29 DIAGNOSIS — C50111 Malignant neoplasm of central portion of right female breast: Secondary | ICD-10-CM

## 2012-10-29 DIAGNOSIS — C50911 Malignant neoplasm of unspecified site of right female breast: Secondary | ICD-10-CM

## 2012-10-29 LAB — CBC WITH DIFFERENTIAL/PLATELET
Basophils Absolute: 0.1 10*3/uL (ref 0.0–0.1)
Eosinophils Absolute: 0.3 10*3/uL (ref 0.0–0.5)
HCT: 40.4 % (ref 34.8–46.6)
HGB: 13.8 g/dL (ref 11.6–15.9)
MCV: 86.3 fL (ref 79.5–101.0)
MONO%: 6.7 % (ref 0.0–14.0)
NEUT#: 3.5 10*3/uL (ref 1.5–6.5)
NEUT%: 50.4 % (ref 38.4–76.8)
RDW: 12.7 % (ref 11.2–14.5)
lymph#: 2.7 10*3/uL (ref 0.9–3.3)

## 2012-10-29 LAB — COMPREHENSIVE METABOLIC PANEL (CC13)
Albumin: 3.8 g/dL (ref 3.5–5.0)
BUN: 12.4 mg/dL (ref 7.0–26.0)
Calcium: 9.8 mg/dL (ref 8.4–10.4)
Chloride: 104 mEq/L (ref 98–107)
Creatinine: 0.8 mg/dL (ref 0.6–1.1)
Glucose: 102 mg/dl — ABNORMAL HIGH (ref 70–99)
Potassium: 3.8 mEq/L (ref 3.5–5.1)

## 2012-10-29 NOTE — Progress Notes (Signed)
ID: [name]   DOB: 23-Feb-1941  MR#: [id number]  WUJ#:[id number]  PCP: [name], MD GYN: [name], [name], [name], [name]   HISTORY OF PRESENT ILLNESS: Weda had screening mammography at [facility] 04/10/2012 raising the question of some axillary lymph nodes on the right. Additional views of the right axilla and right axillary ultrasound performed 04/04/2012 showed 3 lymph nodes that appeared larger than prior. The largest measured 1.5 cm. 2 of them had cortical thickening. Close followup was suggested, and repeat right axillary ultrasound 06/25/2012 showed no significant change in the lymph nodes in question. Followup ultrasound in 3 months was recommended, but in the interim the patient saw her primary physician, Dr. [name] and ADL, and he set her up for a chest CT on 07/08/2012, which showed a mild asymmetric density in the upper mid right breast. There was a prominent right axillary lymph node measuring 1.9 cm, with a few smaller adjacent nodes asymmetric with compared to the left side. Again, short interval followup was recommended, and on 10/07/2012 the patient had digital right mammography and ultrasonography, now at the breast Center. Dr. [name] noted pleomorphic calcifications spanning an area of 6.3 cm without associated mass. Physical exam was unremarkable. Ultrasound showed an area of adenopathy in the right axilla measuring 1.6 cm, with a second lymph node with a thickened cortex measuring 1.2 cm. No suspicious mass was seen by ultrasound in the right breast.  Biopsy of the larger axillary lymph node on 10/07/2012 showed ([id number]) an invasive ductal carcinoma, triple negative, with an MIB-1 of 66%. Biopsy of the right breast the next day, [id number]) showed invasive ductal carcinoma, grade 3. Breast MRI 10/14/2012 showed a total area of irregular enhancement in the right breast measuring up to 12 cm. MRI guided  biopsy of an area in the upper inner quadrant of the right breast on 10/23/2012 showed ([id number]) ductal carcinoma in situ, with foci worrisome for invasion.  The patient's subsequent history is as detailed below  INTERVAL HISTORY: Winta was seen in the breast clinic 10/29/2012 accompanied by her husband [name] and her friend [name] [name] is a former Therapist, music).  REVIEW OF SYSTEMS: The patient did well with her biopsies, without unusual bleeding, pain, fever, or other complications. She did have significant ecchymosis, and she named her Right red and blue and green breast "[place]". She has no symptoms suggestive of metastatic disease she does have a bit of seasonal allergy symptoms. A detailed review of systems today was otherwise entirely noncontributory.  PAST MEDICAL HISTORY: Past Medical History  Diagnosis Date  . Hypertension   . Cerebrovascular disease, unspecified   . Cerebral aneurysm, nonruptured   . Pure hypercholesterolemia   . Hypothyroidism   . Colonic polyp   . Hemorrhoid   . Degenerative joint disease   . Fibromyalgia   . Osteoporosis   . TIA (transient ischemic attack)   . Anxiety     PAST SURGICAL HISTORY: Past Surgical History  Procedure Laterality Date  . Left middle cerebral artery angioplasty  2004    by [name]    FAMILY HISTORY Family History  Problem Relation Age of Onset  . Lung cancer Mother   . Heart disease Father   . Colon cancer Brother   . Alcohol abuse Brother    the patient's father died at the age of 48. The patient's mother died at the age of 9 from lung cancer. She was not  a smoker. The cancer was diagnosed when she was 72 years old. The patient's mother had 2 sisters and one brother. One of those sisters had lung cancer and a brother had colon cancer, both diagnosed in their 61s. The patient herself has one brother who died from colon cancer at the age of 68. That brother has a daughter who was diagnosed with uterine cancer at  the age of 86. All this raises the question of possible Lynch syndrome and I have referred the patient for genetics counseling.  GYNECOLOGIC HISTORY: Menarche age 72, menopause around 72. The patient is GX P0. She took birth control pills for many years with no complications.  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 [name] who lives in [place]" and is financial vice president of [employer], and [place] lives in [place] and works as a Transport planner. The patient has 5 grandchildren. She is currently not a church attender   ADVANCED DIRECTIVES: Not in place; this was discussed 10/29/2012 and the patient was advised to complete a healthcare power of attorney document  HEALTH MAINTENANCE: History  Substance Use Topics  . Smoking status: Former Smoker -- 1.00 packs/day for 20 years    Types: Cigarettes    Quit date: 04/02/1986  . Smokeless tobacco: Not on file  . Alcohol Use: No     Colonoscopy: 2012  PAP: 2012  Bone density:  Lipid panel:  No Known Allergies  Current Outpatient Prescriptions  Medication Sig Dispense Refill  . aspirin 325 MG tablet Take 325 mg by mouth daily.        . clopidogrel (PLAVIX) 75 MG tablet TAKE ONE TABLET BY MOUTH EVERY DAY  30 tablet  6  . levothyroxine (SYNTHROID, LEVOTHROID) 125 MCG tablet Take 1 tablet (125 mcg total) by mouth daily.  30 tablet  11  . metoprolol succinate (TOPROL-XL) 100 MG 24 hr tablet TAKE ONE TABLET BY MOUTH EVERY DAY  30 tablet  11  . Multiple Vitamin (MULTIVITAMIN) capsule Take 1 capsule by mouth daily.        . rosuvastatin (CRESTOR) 20 MG tablet Take 1 tablet (20 mg total) by mouth daily.  30 tablet  11  . venlafaxine XR (EFFEXOR-XR) 75 MG 24 hr capsule Take 1 capsule (75 mg total) by mouth daily.  30 capsule  6  . Calcium Carbonate-Vitamin D (CALCIUM 600+D) 600-200 MG-UNIT TABS Take 1 tablet by mouth 2  (two) times daily.        Marland Kitchen lisinopril-hydrochlorothiazide (PRINZIDE) 20-12.5 MG per tablet Take 1 tablet by mouth daily.  30 tablet  11   No current facility-administered medications for this visit.    OBJECTIVE: Middle-aged white woman who appears well Filed Vitals:   10/29/12 1559  BP: 163/74  Pulse: 80  Temp: 98.1 F (36.7 C)  Resp: 20     Body mass index is 25.64 kg/(m^2).    ECOG FS: 0  Sclerae unicteric Oropharynx clear No cervical or supraclavicular adenopathy Lungs no rales or rhonchi Heart regular rate and rhythm Abd benign MSK scoliosis but no focal spinal tenderness, no peripheral edema Neuro: nonfocal, well oriented, positive affect Breasts: The right breast is status post recent biopsies, with multiple ecchymoses; however I do not palpate a discrete mass. I also do not palpate an obvious mass in the right axilla. The left breast is unremarkable.   LAB RESULTS: Lab Results  Component Value Date   WBC 7.0 10/29/2012   NEUTROABS 3.5 10/29/2012   HGB 13.8 10/29/2012   HCT 40.4 10/29/2012   MCV 86.3 10/29/2012   PLT 278 10/29/2012      Chemistry      Component Value Date/Time   NA 140 10/29/2012 1546   NA 138 06/04/2012 1124   K 3.8 10/29/2012 1546   K 4.3 06/04/2012 1124   CL 104 10/29/2012 1546   CL 103 06/04/2012 1124   CO2 28 10/29/2012 1546   CO2 29 06/04/2012 1124   BUN 12.4 10/29/2012 1546   BUN 14 06/04/2012 1124   CREATININE 0.8 10/29/2012 1546   CREATININE 0.7 06/04/2012 1124      Component Value Date/Time   CALCIUM 9.8 10/29/2012 1546   CALCIUM 9.9 06/04/2012 1124   ALKPHOS 57 10/29/2012 1546   ALKPHOS 47 06/04/2012 1124   AST 20 10/29/2012 1546   AST 27 06/04/2012 1124   ALT 21 10/29/2012 1546   ALT 32 06/04/2012 1124   BILITOT 0.47 10/29/2012 1546   BILITOT 1.1 06/04/2012 1124       No results found for this basename: LABCA2    No components found with this basename: LABCA125    No results found for this basename: INR,  in the last 168  hours  Urinalysis    Component Value Date/Time   COLORURINE YELLOW 06/21/2008 2357   APPEARANCEUR CLEAR 06/21/2008 2357   LABSPEC 1.017 06/21/2008 2357   PHURINE 7.0 06/21/2008 2357   GLUCOSEU NEGATIVE 06/21/2008 2357   HGBUR NEGATIVE 06/21/2008 2357   BILIRUBINUR NEGATIVE 06/21/2008 2357   KETONESUR NEGATIVE 06/21/2008 2357   PROTEINUR NEGATIVE 06/21/2008 2357   UROBILINOGEN 0.2 06/21/2008 2357   NITRITE NEGATIVE 06/21/2008 2357   LEUKOCYTESUR MODERATE* 06/21/2008 2357    STUDIES: US Breast Right  10/07/2012  *RADIOLOGY REPORT*  Clinical Data:  Short-term interval follow-up of a probable benign lymph node in the right axilla.  DIGITAL DIAGNOSTIC RIGHT MAMMOGRAM WITH CAD AND RIGHT BREAST ULTRASOUND:  Comparison:  Mammograms dated 04/04/2012, 04/02/2012, 03/20/2011 and 09/21/2009 as well as prior ultrasounds dated 06/25/2012 and 04/04/2012 from [facility] imaging.  Findings:  ACR Breast Density Category 4: The breast tissue is extremely dense.  There are developing pleomorphic calcifications spanning an area of 6.3 x 4.7 x 5.1 cm in transverse, anterior posterior and longitudinal dimensions mostly centered in the upper and medial aspect of the right breast.  There is no associated mass. Prominent right axillary lymph nodes are visualized.  Mammographic images were processed with CAD.  On physical exam, I do not palpate a mass in the right breast axilla.  Ultrasound is performed, showing there is either a bilobed lobe lymph node or two adjacent lymph nodes in the right axilla measuring 1.6 cm.  There is a second lymph node with a thickened cortex measuring 1.2 cm.  Sonographic evaluation of the entire right breast was performed.  No suspicious mass was seen in the breast.  IMPRESSION: Suspicious calcifications in the right breast and axillary adenopathy.  RECOMMENDATION:  1.  An ultrasound-guided core biopsy of a right axillary lymph node will be performed and dictated separately. 2.  Stereotactic biopsy  of the right breast has been scheduled on 10/08/2012.  I have discussed the findings and recommendations with the patient. Results were also provided in writing at the conclusion of the visit.  BI-RADS CATEGORY 4:  Suspicious abnormality - biopsy should be considered.   Original Report Authenticated By: Norwood Levo  [name], M.D.    Mr Breast Bilateral W Wo Contrast  10/14/2012  *RADIOLOGY REPORT*  Clinical Data: 72 year old female with newly diagnosed right breast carcinoma  BUN and creatinine were obtained on site at [facility] at [address] Results:  BUN 8.0 mg/dL,  Creatinine 0.8 mg/dL.  BILATERAL BREAST MRI WITH AND WITHOUT CONTRAST  Technique: Multiplanar, multisequence MR images of both breasts were obtained prior to and following the intravenous administration of 15ml of Multihance.  Three dimensional images were evaluated at the independent DynaCad workstation.  Comparison:  10/08/2012 mammogram and ultrasound from the [facility].  Prior mammograms and ultrasounds from [place].  Findings: Mild normal background parenchymal enhancement is noted.  RIGHT BREAST:  Irregular linear and stippled enhancement throughout the entire upper and central right breast identified, measuring 8 x 12 x 7 cm (transverse x AP x CC).  Biopsy clip artifact and small hematoma within the anterior upper inner right breast is identified.  LEFT BREAST:  No abnormal areas of enhancement are identified within the left breast.  LYMPH NODES:  An enlarged level I right axillary lymph node is identified with postbiopsy changes compatible with biopsy-proven metastatic neoplasm. No other enlarged lymph nodes are identified.  OTHER: No bony or upper hepatic abnormalities identified.  IMPRESSION: 8 x 12 x 7 cm irregular enhancement throughout the entire central and upper right breast and enlarged level I right axillary lymph node, compatible with neoplasm and lymph node metastasis.  If breast conservation is considered, sampling of  the posterior aspect of the irregular enhancement may be warranted.  No evidence of contralateral neoplasm.  BI-RADS CATEGORY 6:  Known biopsy-proven malignancy - appropriate action should be taken.  RECOMMENDATION: Treatment plan.  Consider right breast sampling as discussed above.  THREE-DIMENSIONAL MR IMAGE RENDERING ON INDEPENDENT WORKSTATION:  Three-dimensional MR images were rendered by post-processing of the original MR data on an independent workstation.  The three- dimensional MR images were interpreted, and findings were reported in the accompanying complete MRI report for this study.   Original Report Authenticated By: Harmon Pier, M.D.    Nm Pet Image Initial (pi) Skull Base To Thigh  10/24/2012  *RADIOLOGY REPORT*  Clinical Data: Initial treatment strategy for newly diagnosed right breast cancer.  NUCLEAR MEDICINE PET SKULL BASE TO THIGH  Fasting Blood Glucose:  89  Technique:  17.6 mCi F-18 FDG was injected intravenously. CT data was obtained and used for attenuation correction and anatomic localization only.  (This was not acquired as a diagnostic CT examination.) Additional exam technical data entered on technologist worksheet.  Comparison:  CT chest dated 07/08/2012  Findings:  Neck: No hypermetabolic lymph nodes in the neck.  Chest:  2.3 x 2.7 cm upper central right breast mass (series 2/image 85), max SUV 4.6, corresponding to the known right breast cancer.  10 mm short-axis right axillary node (series 2/image 76), max SUV 2.4.  Additional 7 mm short-axis right axillary node superiorly (series 2/image 62), max SUV 2.5.  No hypermetabolic mediastinal or hilar nodes.  No suspicious pulmonary nodules on the CT scan.  Coronary atherosclerosis.  Atherosclerotic calcifications of the aortic arch.  Abdomen/Pelvis:  No abnormal hypermetabolic activity within the liver, pancreas, adrenal glands, or spleen.  No hypermetabolic lymph nodes in the abdomen or pelvis.  Skeleton:  No focal hypermetabolic  activity to suggest skeletal metastasis.  IMPRESSION: 2.7 cm upper central right breast mass, max SUV 4.6, corresponding to known right breast cancer.  Two hypermetabolic right axillary lymph  nodes measuring up to 10 mm short axis, max SUV 2.5.  No evidence of distant metastases.   Original Report Authenticated By: Charline Bills, M.D.    Mm Digital Diagnostic Unilat L  10/08/2012  *RADIOLOGY REPORT*  Clinical Data:  Known metastatic right axillary lymph node. Suspicious calcifications in the right breast were biopsied and results are pending.  DIGITAL DIAGNOSTIC LEFT MAMMOGRAM WITH CAD  Comparison: Prior mammograms dated 04/02/2012, 03/20/2011 and 09/21/2009 from [facility]  Findings:  ACR Breast Density Category 3: The breast tissue is heterogeneously dense.  There is no new suspicious mass or malignant-type microcalcifications in the left breast.  Mammographic images were processed with CAD.  IMPRESSION: No evidence of malignancy in the left breast.  RECOMMENDATION: Continued yearly screening mammogram of the left breast is recommended.  I have discussed the findings and recommendations with the patient. Results were also provided in writing at the conclusion of the visit.  BI-RADS CATEGORY 1:  Negative.   Original Report Authenticated By: [name], M.D.    Mm Digital Diagnostic Unilat R  10/23/2012  *RADIOLOGY REPORT*  Clinical Data:  abnormal enhancement posteromedial right breast superiorly, pt recently diagnosed with right breast cancer more anteriorly  DIGITAL DIAGNOSTIC RIGHT MAMMOGRAM  Comparison:  Previous exams.  Findings:  Films are performed following mri guided biopsy of right breast enhancement posteriorly in the upper inner quadrant.  The dumbbell shaped marker clip projects in the anticipated position.  IMPRESSION: Appropriate clip placement   Original Report Authenticated By: Esperanza Heir, M.D.     10/10/2012  **ADDENDUM** CREATED: 10/10/2012 14:06:39  Pathology was  finalized and showed grade III invasive ductal carcinoma with lymphovascular invasion and a metastatic axillary lymph node.  This is concordant with imaging findings.  The patient was contacted by telephone and given the results of the biopsies.  **END ADDENDUM** SIGNED BY: Dina L. [name], M.D.   10/09/2012  *RADIOLOGY REPORT*  ESTABLISHED PATIENT OFFICE VISIT - LEVEL III [phone])  Chief Complaint:  The patient is status post stereotactic biopsy of the right breast.  She returns for wound site check and results. The patient is on Plavix.  History:  The patient presented for a short-term interval follow-up of a right axillary lymph node which was subsequently biopsied and proven to be a metastatic lymph node.  Mammographic images showed suspicious calcifications in the right breast and a stereotactic biopsy was performed.  Exam:  There is a moderate amount of ecchymosis involving the right breast and axilla.  There is no signs of infection.  Results of the biopsy were discussed with the patient her husband.  Pathology:  The pathology has not been finalized.  Pathology currently shows at least high-grade DCIS suspicious for lymphovascular invasion.  Assessment and Plan:  The patient will be seen at the Multidisciplinary Clinic on 10/16/2012.  Breast MRI has been arranged.  Original Report Authenticated By: [name], M.D.    10/08/2012  **ADDENDUM** CREATED: 10/08/2012 11:47:22  The patient returns for a stereotactic biopsy of calcifications in the right breast.  A metastatic right axillary lymph node was reported histologically from the biopsy performed on 10/07/2012.  This corresponds well with the imaging findings. There is a moderate amount of ecchymosis present.  There is no palpable hematoma.  There is no signs of infection.  Results of the biopsy were discussed with the patient.  **END ADDENDUM** SIGNED BY: Dina L. [name], M.D.   10/24/2012  **ADDENDUM** CREATED: 10/24/2012 12:09:06  I spoke with the patient  by  telephone on 10/24/2012 to discuss pathology results.  Pathology demonstrates:  Breast, right, needle core biopsy, UIQ  - DUCTAL CARCINOMA IN SITU WITH FOCI WORRISOME FOR INVASION.  - LYMPHOVASCULAR INVASION IDENTIFIED.  This biopsy site is approximately 8 cm posterior and superior to the original biopsy which also showed malignancy in the right breast.  The findings are also called to Dr. Dwain Sarna by Sonnie Alamo, R.N.  The patient reports no problems at the biopsy site.  All questions were answered.  Recommendations:  Surgical consultation  **END ADDENDUM** SIGNED BY: Blair Hailey. Manson Passey, M.D.   10/23/2012  *RADIOLOGY REPORT*  Clinical Data:  new diagnosis right breast cancer anteriorly; abnormal enhancement posterior right upper inner breast  MRI GUIDED VACUUM ASSISTED BIOPSY OF THE RIGHT BREAST WITHOUT AND WITH CONTRAST  Comparison: Previous exams.  Technique: Multiplanar, multisequence MR images of the right breast were obtained prior to and following the intravenous administration of 15 ml of Mulithance.  I met with the patient, and we discussed the procedure of MRI guided biopsy, including risks, benefits, and alternatives. Specifically, we discussed the risks of infection, bleeding, tissue injury, clip migration, and inadequate sampling.  Informed, written consent was given.  Using sterile technique, 2% Lidocaine, MRI guidance, and a 9 gauge vacuum assisted device, biopsy was performed of the enhancement using a mediolateral approach.  At the conclusion of the procedure, a dumbbell shaped tissue marker clip was deployed into the biopsy cavity.  IMPRESSION: MRI guided biopsy of abnormal right breast enhancement. No apparent complications.  THREE-DIMENSIONAL MR IMAGE RENDERING ON INDEPENDENT WORKSTATION:  Three-dimensional MR images were rendered by post-processing of the original MR data on an independent workstation.  The three- dimensional MR images were interpreted, and findings were reported in the  accompanying complete MRI report for this study.   Original Report Authenticated By: Esperanza Heir, M.D.     ASSESSMENT: 72 y.o. [place] woman status post right breast and right axillary lymph node biopsies February 2014 of a clinical T2, pN1, stage IIB,  invasive ductal carcinoma, grade 3, triple negative, with an MIB-1 of 15%  1) a third biopsy from a different quadrant 10/23/2012 showed ductal carcinoma in situ  (2) family history suggestive of Lynch syndrome  PLAN: I spent the better part of today's hour-plus visit discussing the biology of breast cancer and the details of her situation with Beverely Low and her husband and friend. She understands she has at least stage IIB, possibly early stage III invasive breast cancer. Since the tumor is triple negative, she will need chemotherapy.  She understands that whether she has adjuvant or neoadjuvant chemotherapy, she will receive the same treatment, and obtained the same ultimately results. In her case I think starting with chemotherapy makes sense, not because we expect to be able to save her breast, since the patient understands she will need a mastectomy in any case, but because starting with chemotherapy would give the patient time to get genetically tested. If she proves positive for Lynch syndrome she might consider bilateral mastectomies. Also the patient is considering reconstruction, and this would give her time to discuss the options with plastic surgery prior to definitive procedure.  Accordingly the plan is to start chemotherapy March 31. The patient's granddaughter will be graduating June 9, and starting March 31 fits best with that particular plan. She will need chemotherapy school, and a port placed, as well as an echocardiogram. She will see me again on March 28, and on that day we will discuss  the possible toxicities, side effects and complications of chemotherapy in more detail and she'll receive a "map" of how to take her anti-emetics  and other supportive meds. Of course she will also receive her prescriptions that day.  Ashia understands this plan well and is very much in agreement with that. She knows to call for any problems that may develop before next visit.  Jamiesha Victoria C    10/29/2012

## 2012-10-30 ENCOUNTER — Encounter (INDEPENDENT_AMBULATORY_CARE_PROVIDER_SITE_OTHER): Payer: Self-pay | Admitting: General Surgery

## 2012-10-30 ENCOUNTER — Telehealth: Payer: Self-pay | Admitting: *Deleted

## 2012-10-30 ENCOUNTER — Telehealth (INDEPENDENT_AMBULATORY_CARE_PROVIDER_SITE_OTHER): Payer: Self-pay

## 2012-10-30 ENCOUNTER — Ambulatory Visit (INDEPENDENT_AMBULATORY_CARE_PROVIDER_SITE_OTHER): Payer: Medicare Other | Admitting: General Surgery

## 2012-10-30 VITALS — BP 126/81 | HR 62 | Temp 97.2°F | Resp 14 | Ht 69.0 in | Wt 167.2 lb

## 2012-10-30 DIAGNOSIS — C50919 Malignant neoplasm of unspecified site of unspecified female breast: Secondary | ICD-10-CM | POA: Diagnosis not present

## 2012-10-30 DIAGNOSIS — C773 Secondary and unspecified malignant neoplasm of axilla and upper limb lymph nodes: Secondary | ICD-10-CM

## 2012-10-30 DIAGNOSIS — C50911 Malignant neoplasm of unspecified site of right female breast: Secondary | ICD-10-CM

## 2012-10-30 NOTE — Progress Notes (Signed)
Subjective:     Patient ID: Debra Mcintyre, female   DOB: [birth year], 72 y.o.   MRN: [id number]  HPI 16 yof who I saw recently with tnbc. Had larger area on mr that has undergone biopsy that shows dcis about 8 cm away from other biopsy. She had positive node upon presentation.  She has been seen by Dr. Darnelle Catalan now. She has discussed genetic testing as well as primary systemic therapy.  I have discussed with Dr. Darnelle Catalan and think this is reasonable also but I don't think that will change her ultimate surgical plan of mrm unfortunately. She comes in today to discuss port placement.  Review of Systems     Objective:   Physical Exam deferred    Assessment:    clinical stage 2 tnbc    Plan:     We discussed diagnosis again as well as therapy options.  We discussed surgery following completion of chemotherapy.  I discussed port placement and showed them a device.  Risks include but are not limited to bleeding, infection, pneumothorax. She is due to start chemotherapy 3/31 and will have port placed by then.  She will hold plavix five days prior

## 2012-10-30 NOTE — Telephone Encounter (Signed)
Called pt back to let her know that I normally don't make post op appt's for pt's getting a PAC placement b/c they will be getting checked at the [facility] when treatments get started. I know the pt has so many appt's as long as she is doing ok after the [facility] placement sx then she will not need an appt with [place]. The pt will be seeing the [facility] for treatments but if the pt wants to be seen again by Dr Dwain Sarna then there isn't a problem to make her one.

## 2012-10-30 NOTE — Telephone Encounter (Signed)
Left message for pt to return my call so I can give her the genetic appt.

## 2012-10-31 ENCOUNTER — Telehealth: Payer: Self-pay | Admitting: *Deleted

## 2012-10-31 ENCOUNTER — Encounter (HOSPITAL_BASED_OUTPATIENT_CLINIC_OR_DEPARTMENT_OTHER): Payer: Self-pay | Admitting: *Deleted

## 2012-10-31 NOTE — Progress Notes (Signed)
To come in for ekg- 

## 2012-10-31 NOTE — Telephone Encounter (Signed)
Confirmed appt with Dr. Dayton Scrape on 11/14/12.

## 2012-11-01 ENCOUNTER — Encounter (INDEPENDENT_AMBULATORY_CARE_PROVIDER_SITE_OTHER): Payer: Medicare Other | Admitting: General Surgery

## 2012-11-01 ENCOUNTER — Encounter (HOSPITAL_BASED_OUTPATIENT_CLINIC_OR_DEPARTMENT_OTHER)
Admission: RE | Admit: 2012-11-01 | Discharge: 2012-11-01 | Disposition: A | Discharge status: Home / Self Care | Payer: Medicare Other | Source: Ambulatory Visit | Attending: General Surgery | Admitting: General Surgery

## 2012-11-01 ENCOUNTER — Other Ambulatory Visit: Payer: Self-pay

## 2012-11-01 ENCOUNTER — Telehealth: Payer: Self-pay | Admitting: *Deleted

## 2012-11-01 DIAGNOSIS — Z01812 Encounter for preprocedural laboratory examination: Secondary | ICD-10-CM | POA: Diagnosis not present

## 2012-11-01 DIAGNOSIS — C50919 Malignant neoplasm of unspecified site of unspecified female breast: Secondary | ICD-10-CM | POA: Diagnosis not present

## 2012-11-01 DIAGNOSIS — Z01810 Encounter for preprocedural cardiovascular examination: Secondary | ICD-10-CM | POA: Diagnosis not present

## 2012-11-01 NOTE — Telephone Encounter (Signed)
Pt returned my call and I confirmed 01/09/13 genetic appt w/ pt.  Mailed calendar to pt.

## 2012-11-05 ENCOUNTER — Telehealth: Payer: Self-pay | Admitting: *Deleted

## 2012-11-05 ENCOUNTER — Telehealth: Payer: Self-pay | Admitting: Oncology

## 2012-11-05 ENCOUNTER — Other Ambulatory Visit: Payer: Medicare Other

## 2012-11-05 ENCOUNTER — Ambulatory Visit (HOSPITAL_COMMUNITY): Payer: Medicare Other

## 2012-11-05 ENCOUNTER — Encounter (HOSPITAL_BASED_OUTPATIENT_CLINIC_OR_DEPARTMENT_OTHER): Payer: Self-pay | Admitting: Anesthesiology

## 2012-11-05 ENCOUNTER — Encounter (HOSPITAL_BASED_OUTPATIENT_CLINIC_OR_DEPARTMENT_OTHER)
Admission: RE | Disposition: A | Discharge status: Home / Self Care | Payer: Self-pay | Source: Ambulatory Visit | Attending: General Surgery

## 2012-11-05 ENCOUNTER — Ambulatory Visit (HOSPITAL_BASED_OUTPATIENT_CLINIC_OR_DEPARTMENT_OTHER): Payer: Medicare Other | Admitting: Anesthesiology

## 2012-11-05 ENCOUNTER — Ambulatory Visit (HOSPITAL_BASED_OUTPATIENT_CLINIC_OR_DEPARTMENT_OTHER)
Admission: RE | Admit: 2012-11-05 | Discharge: 2012-11-05 | Disposition: A | Discharge status: Home / Self Care | Payer: Medicare Other | Source: Ambulatory Visit | Attending: General Surgery | Admitting: General Surgery

## 2012-11-05 DIAGNOSIS — C50919 Malignant neoplasm of unspecified site of unspecified female breast: Secondary | ICD-10-CM | POA: Insufficient documentation

## 2012-11-05 DIAGNOSIS — Z01818 Encounter for other preprocedural examination: Secondary | ICD-10-CM | POA: Diagnosis not present

## 2012-11-05 DIAGNOSIS — D0590 Unspecified type of carcinoma in situ of unspecified breast: Secondary | ICD-10-CM

## 2012-11-05 DIAGNOSIS — Z01812 Encounter for preprocedural laboratory examination: Secondary | ICD-10-CM | POA: Diagnosis not present

## 2012-11-05 DIAGNOSIS — Z01810 Encounter for preprocedural cardiovascular examination: Secondary | ICD-10-CM | POA: Diagnosis not present

## 2012-11-05 HISTORY — PX: PORTACATH PLACEMENT: SHX2246

## 2012-11-05 SURGERY — INSERTION, TUNNELED CENTRAL VENOUS DEVICE, WITH PORT
Anesthesia: General | Site: Chest | Wound class: Clean

## 2012-11-05 MED ORDER — FENTANYL CITRATE 0.05 MG/ML IJ SOLN: 50.0000 ug | INTRAMUSCULAR | Status: DC | PRN

## 2012-11-05 MED ORDER — FENTANYL CITRATE 0.05 MG/ML IJ SOLN
INTRAMUSCULAR | Status: DC | PRN
  Administered 2012-11-05 (×2): 50 ug via INTRAVENOUS

## 2012-11-05 MED ORDER — OXYCODONE HCL 5 MG PO TABS: 5.0000 mg | ORAL_TABLET | Freq: Once | ORAL | Status: DC | PRN

## 2012-11-05 MED ORDER — BUPIVACAINE HCL (PF) 0.25 % IJ SOLN
INTRAMUSCULAR | Status: DC | PRN
  Administered 2012-11-05: 9 mL

## 2012-11-05 MED ORDER — MIDAZOLAM HCL 5 MG/5ML IJ SOLN
INTRAMUSCULAR | Status: DC | PRN
  Administered 2012-11-05: 1 mg via INTRAVENOUS

## 2012-11-05 MED ORDER — DEXAMETHASONE SODIUM PHOSPHATE 4 MG/ML IJ SOLN
INTRAMUSCULAR | Status: DC | PRN
  Administered 2012-11-05: 10 mg via INTRAVENOUS

## 2012-11-05 MED ORDER — HEPARIN SOD (PORK) LOCK FLUSH 100 UNIT/ML IV SOLN
INTRAVENOUS | Status: DC | PRN
  Administered 2012-11-05: 500 [IU] via INTRAVENOUS

## 2012-11-05 MED ORDER — OXYCODONE-ACETAMINOPHEN 5-325 MG PO TABS: 1.0000 | ORAL_TABLET | ORAL | Status: DC | PRN

## 2012-11-05 MED ORDER — ONDANSETRON HCL 4 MG/2ML IJ SOLN: 4.0000 mg | Freq: Once | INTRAMUSCULAR | Status: DC | PRN

## 2012-11-05 MED ORDER — LACTATED RINGERS IV SOLN
INTRAVENOUS | Status: DC
  Administered 2012-11-05 (×2): via INTRAVENOUS

## 2012-11-05 MED ORDER — HYDROMORPHONE HCL PF 1 MG/ML IJ SOLN: 0.2500 mg | INTRAMUSCULAR | Status: DC | PRN

## 2012-11-05 MED ORDER — ONDANSETRON HCL 4 MG/2ML IJ SOLN
INTRAMUSCULAR | Status: DC | PRN
  Administered 2012-11-05: 4 mg via INTRAVENOUS

## 2012-11-05 MED ORDER — HEPARIN (PORCINE) IN NACL 2-0.9 UNIT/ML-% IJ SOLN
INTRAMUSCULAR | Status: DC | PRN
  Administered 2012-11-05: 1 via INTRAVENOUS

## 2012-11-05 MED ORDER — OXYCODONE HCL 5 MG/5ML PO SOLN: 5.0000 mg | Freq: Once | ORAL | Status: DC | PRN

## 2012-11-05 MED ORDER — EPHEDRINE SULFATE 50 MG/ML IJ SOLN
INTRAMUSCULAR | Status: DC | PRN
  Administered 2012-11-05 (×4): 10 mg via INTRAVENOUS

## 2012-11-05 MED ORDER — PROPOFOL 10 MG/ML IV BOLUS
INTRAVENOUS | Status: DC | PRN
  Administered 2012-11-05: 200 mg via INTRAVENOUS

## 2012-11-05 MED ORDER — MIDAZOLAM HCL 2 MG/2ML IJ SOLN: 1.0000 mg | INTRAMUSCULAR | Status: DC | PRN

## 2012-11-05 MED ORDER — CEFAZOLIN SODIUM-DEXTROSE 2-3 GM-% IV SOLR
2.0000 g | INTRAVENOUS | Status: AC
  Administered 2012-11-05: 2 g via INTRAVENOUS

## 2012-11-05 SURGICAL SUPPLY — 51 items
ADH SKN CLS APL DERMABOND .7 (GAUZE/BANDAGES/DRESSINGS) ×1
APL SKNCLS STERI-STRIP NONHPOA (GAUZE/BANDAGES/DRESSINGS) ×1
BAG DECANTER FOR FLEXI CONT (MISCELLANEOUS) ×2 IMPLANT
BENZOIN TINCTURE PRP APPL 2/3 (GAUZE/BANDAGES/DRESSINGS) ×2 IMPLANT
BLADE SURG 11 STRL SS (BLADE) ×2 IMPLANT
BLADE SURG 15 STRL LF DISP TIS (BLADE) ×1 IMPLANT
BLADE SURG 15 STRL SS (BLADE) ×2
CANISTER SUCTION 1200CC (MISCELLANEOUS) IMPLANT
CHLORAPREP W/TINT 26ML (MISCELLANEOUS) ×2 IMPLANT
CLOTH BEACON ORANGE TIMEOUT ST (SAFETY) ×2 IMPLANT
COVER MAYO STAND STRL (DRAPES) ×2 IMPLANT
COVER TABLE BACK 60X90 (DRAPES) ×2 IMPLANT
DECANTER SPIKE VIAL GLASS SM (MISCELLANEOUS) IMPLANT
DERMABOND ADVANCED (GAUZE/BANDAGES/DRESSINGS) ×1
DERMABOND ADVANCED .7 DNX12 (GAUZE/BANDAGES/DRESSINGS) ×1 IMPLANT
DRAPE C-ARM 42X72 X-RAY (DRAPES) ×2 IMPLANT
DRAPE LAPAROSCOPIC ABDOMINAL (DRAPES) ×2 IMPLANT
DRSG TEGADERM 4X4.75 (GAUZE/BANDAGES/DRESSINGS) IMPLANT
ELECT COATED BLADE 2.86 ST (ELECTRODE) ×2 IMPLANT
ELECT REM PT RETURN 9FT ADLT (ELECTROSURGICAL) ×2
ELECTRODE REM PT RTRN 9FT ADLT (ELECTROSURGICAL) ×1 IMPLANT
GAUZE SPONGE 4X4 12PLY STRL LF (GAUZE/BANDAGES/DRESSINGS) ×2 IMPLANT
GLOVE BIO SURGEON STRL SZ7 (GLOVE) ×3 IMPLANT
GLOVE BIOGEL PI IND STRL 7.5 (GLOVE) ×1 IMPLANT
GLOVE BIOGEL PI INDICATOR 7.5 (GLOVE) ×1
GOWN PREVENTION PLUS XLARGE (GOWN DISPOSABLE) ×3 IMPLANT
GOWN PREVENTION PLUS XXLARGE (GOWN DISPOSABLE) ×1 IMPLANT
IV HEPARIN 1000UNITS/500ML (IV SOLUTION) ×2 IMPLANT
IV KIT MINILOC 20X1 SAFETY (NEEDLE) IMPLANT
KIT PORT POWER 8FR ISP CVUE (Catheter) ×2 IMPLANT
NDL HYPO 25X1 1.5 SAFETY (NEEDLE) ×1 IMPLANT
NDL SAFETY ECLIPSE 18X1.5 (NEEDLE) IMPLANT
NEEDLE HYPO 18GX1.5 SHARP (NEEDLE)
NEEDLE HYPO 25X1 1.5 SAFETY (NEEDLE) ×2 IMPLANT
PACK BASIN DAY SURGERY FS (CUSTOM PROCEDURE TRAY) ×2 IMPLANT
PENCIL BUTTON HOLSTER BLD 10FT (ELECTRODE) ×2 IMPLANT
SLEEVE SCD COMPRESS KNEE MED (MISCELLANEOUS) ×2 IMPLANT
STAPLER VISISTAT 35W (STAPLE) ×2 IMPLANT
STRIP CLOSURE SKIN 1/2X4 (GAUZE/BANDAGES/DRESSINGS) ×2 IMPLANT
SUT MON AB 4-0 PC3 18 (SUTURE) ×2 IMPLANT
SUT PROLENE 2 0 SH DA (SUTURE) ×2 IMPLANT
SUT SILK 2 0 TIES 17X18 (SUTURE)
SUT SILK 2-0 18XBRD TIE BLK (SUTURE) IMPLANT
SUT VIC AB 3-0 SH 27 (SUTURE) ×2
SUT VIC AB 3-0 SH 27X BRD (SUTURE) ×1 IMPLANT
SYR 5ML LUER SLIP (SYRINGE) ×2 IMPLANT
SYR CONTROL 10ML LL (SYRINGE) ×2 IMPLANT
TOWEL OR 17X24 6PK STRL BLUE (TOWEL DISPOSABLE) ×2 IMPLANT
TOWEL OR NON WOVEN STRL DISP B (DISPOSABLE) ×2 IMPLANT
TUBE CONNECTING 20X1/4 (TUBING) IMPLANT
YANKAUER SUCT BULB TIP NO VENT (SUCTIONS) IMPLANT

## 2012-11-05 NOTE — Telephone Encounter (Signed)
Added f/u w/GM for 3/28 @ 2pm. D/t/verbal per GM (Crystal). Pt already aware of next appt for 3/27 and will get new schedule when she comes in.

## 2012-11-05 NOTE — Anesthesia Postprocedure Evaluation (Signed)
  Anesthesia Post-op Note  Patient: Debra Mcintyre  Procedure(s) Performed: Procedure(s): INSERTION PORT-A-CATH (N/A)  Patient Location: PACU  Anesthesia Type:General  Level of Consciousness: awake, alert  and oriented  Airway and Oxygen Therapy: Patient Spontanous Breathing  Post-op Pain: mild  Post-op Assessment: Post-op Vital signs reviewed  Post-op Vital Signs: Reviewed  Complications: No apparent anesthesia complications

## 2012-11-05 NOTE — Progress Notes (Signed)
Xray completed. Awaiting results. Pt. Tolerated well.

## 2012-11-05 NOTE — Telephone Encounter (Signed)
Reschedule chemo class for 3/27 at 12:30pm.

## 2012-11-05 NOTE — Transfer of Care (Signed)
Immediate Anesthesia Transfer of Care Note  Patient: Debra Mcintyre  Procedure(s) Performed: Procedure(s): INSERTION PORT-A-CATH (N/A)  Patient Location: PACU  Anesthesia Type:General  Level of Consciousness: sedated and patient cooperative  Airway & Oxygen Therapy: Patient Spontanous Breathing and Patient connected to face mask oxygen  Post-op Assessment: Report given to PACU RN and Post -op Vital signs reviewed and stable  Post vital signs: Reviewed and stable  Complications: No apparent anesthesia complications

## 2012-11-05 NOTE — Anesthesia Preprocedure Evaluation (Signed)
Anesthesia Evaluation  Patient identified by MRN, date of birth, ID band Patient awake    Reviewed: Allergy & Precautions, H&P , NPO status , Patient's Chart, lab work & pertinent test results, reviewed documented beta blocker date and time   Airway Mallampati: I TM Distance: >3 FB Neck ROM: Full    Dental  (+) Teeth Intact and Dental Advisory Given   Pulmonary asthma ,  breath sounds clear to auscultation        Cardiovascular hypertension, Pt. on medications and Pt. on home beta blockers Rhythm:Regular Rate:Normal     Neuro/Psych    GI/Hepatic   Endo/Other    Renal/GU      Musculoskeletal   Abdominal   Peds  Hematology   Anesthesia Other Findings   Reproductive/Obstetrics                           Anesthesia Physical Anesthesia Plan  ASA: II  Anesthesia Plan: General   Post-op Pain Management:    Induction: Intravenous  Airway Management Planned: LMA  Additional Equipment:   Intra-op Plan:   Post-operative Plan: Extubation in OR  Informed Consent: I have reviewed the patients History and Physical, chart, labs and discussed the procedure including the risks, benefits and alternatives for the proposed anesthesia with the patient or authorized representative who has indicated his/her understanding and acceptance.   Dental advisory given  Plan Discussed with: CRNA, Anesthesiologist and Surgeon  Anesthesia Plan Comments:         Anesthesia Quick Evaluation

## 2012-11-05 NOTE — Telephone Encounter (Signed)
Added lb/tx/inj appts and ched class. S/w pt today re echo @ [facility] 3/26 @ 12:45pm. Pt also given next appt for 3/27 and will get new schedule when she comes in. Per Bonita Quin no preauth needed for echo. Awaiting instruction from GM re f/u - slot for 3/28 @ 10:30am taken.

## 2012-11-05 NOTE — Anesthesia Procedure Notes (Signed)
Procedure Name: LMA Insertion Date/Time: 11/05/2012 8:32 AM Performed by: Gar Gibbon Pre-anesthesia Checklist: Patient identified, Emergency Drugs available, Suction available and Patient being monitored Patient Re-evaluated:Patient Re-evaluated prior to inductionOxygen Delivery Method: Circle System Utilized Preoxygenation: Pre-oxygenation with 100% oxygen Intubation Type: IV induction Ventilation: Mask ventilation without difficulty LMA: LMA inserted LMA Size: 4.0 Number of attempts: 1 Airway Equipment and Method: bite block Placement Confirmation: positive ETCO2 Tube secured with: Tape Dental Injury: Teeth and Oropharynx as per pre-operative assessment

## 2012-11-05 NOTE — Interval H&P Note (Signed)
History and Physical Interval Note:  11/05/2012 8:11 AM  Debra Mcintyre  has presented today for surgery, with the diagnosis of place port for chemotherapy  The various methods of treatment have been discussed with the patient and family. After consideration of risks, benefits and other options for treatment, the patient has consented to  Procedure(s): INSERTION PORT-A-CATH (N/A) as a surgical intervention .  The patient's history has been reviewed, patient examined, no change in status, stable for surgery.  I have reviewed the patient's chart and labs.  Questions were answered to the patient's satisfaction.     Croy Drumwright

## 2012-11-05 NOTE — H&P (View-Only) (Signed)
Patient ID: Debra Mcintyre, female   DOB: Oct 20, 1940, 72 y.o.   MRN: [id number]  Chief Complaint  Patient presents with  . Other    Eval new br cancer    HPI Debra Mcintyre is a 72 y.o. female.  Referred by Dr. Alroy Dust HPI 65 yof who underwent had possible right sided adenopathy that were slightly enlarged seen previously that were followed until recently.  She recently underwent diagnostic right mm and u/s at breast center.  On the right side there are developing pleomorphic calcifications spanning area measuring 6.3x4.7x5.1 cm in transverse, anterior posterior and longitudinal dimension mostly centered in the upper and medial aspect of the right breast.  There is no associated mass.  There are prominent right axillary nodes noted.  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  She has mr scheduled.  She comes in today without any breast complaints to discuss options.  Past Medical History  Diagnosis Date  . Hypertension   . Cerebrovascular disease, unspecified   . Cerebral aneurysm, nonruptured   . Pure hypercholesterolemia   . Hypothyroidism   . Colonic polyp   . Hemorrhoid   . Degenerative joint disease   . Fibromyalgia   . Osteoporosis   . TIA (transient ischemic attack)   . Anxiety     Past Surgical History  Procedure Laterality Date  . Left middle cerebral artery angioplasty  2004    by [name]    Family History  Problem Relation Age of Onset  . Lung cancer Mother   . Heart disease Father   . Colon cancer Brother   . Alcohol abuse Brother     Social History History  Substance Use Topics  . Smoking status: Former Smoker -- 1.00 packs/day for 20 years    Types: Cigarettes    Quit date: 04/02/1986  . Smokeless tobacco: Not on file  . Alcohol Use: No    No Known Allergies  Current Outpatient Prescriptions  Medication Sig Dispense Refill  . aspirin 325 MG tablet Take 325 mg by  mouth daily.        . Calcium Carbonate-Vitamin D (CALCIUM 600+D) 600-200 MG-UNIT TABS Take 1 tablet by mouth 2 (two) times daily.        . clopidogrel (PLAVIX) 75 MG tablet TAKE ONE TABLET BY MOUTH EVERY DAY  30 tablet  6  . levothyroxine (SYNTHROID, LEVOTHROID) 125 MCG tablet Take 1 tablet (125 mcg total) by mouth daily.  30 tablet  11  . metoprolol succinate (TOPROL-XL) 100 MG 24 hr tablet TAKE ONE TABLET BY MOUTH EVERY DAY  30 tablet  11  . Multiple Vitamin (MULTIVITAMIN) capsule Take 1 capsule by mouth daily.        . rosuvastatin (CRESTOR) 20 MG tablet Take 1 tablet (20 mg total) by mouth daily.  30 tablet  11  . venlafaxine XR (EFFEXOR-XR) 75 MG 24 hr capsule Take 1 capsule (75 mg total) by mouth daily.  30 capsule  6  . lisinopril-hydrochlorothiazide (PRINZIDE) 20-12.5 MG per tablet Take 1 tablet by mouth daily.  30 tablet  11   No current facility-administered medications for this visit.    Review of Systems Review of Systems  Constitutional: Negative for fever, chills and unexpected weight change.  HENT: Negative for hearing loss, congestion, sore throat, trouble swallowing and voice change.   Eyes: Negative for visual disturbance.  Respiratory: Negative for cough and wheezing.   Cardiovascular: Negative for  chest pain, palpitations and leg swelling.  Gastrointestinal: Negative for nausea, vomiting, abdominal pain, diarrhea, constipation, blood in stool, abdominal distention and anal bleeding.  Genitourinary: Negative for hematuria, vaginal bleeding and difficulty urinating.  Musculoskeletal: Negative for arthralgias.  Skin: Negative for rash and wound.  Neurological: Negative for seizures, syncope and headaches.  Hematological: Negative for adenopathy. Does not bruise/bleed easily.  Psychiatric/Behavioral: Negative for confusion.    Blood pressure 150/78, pulse 88, resp. rate 18, height 5\' 8"  (1.727 m), weight 166 lb (75.297 kg).  Physical Exam Physical Exam  Vitals  reviewed. Constitutional: She appears well-developed and well-nourished.  Eyes: No scleral icterus.  Neck: Neck supple.  Cardiovascular: Normal rate, regular rhythm, normal heart sounds and intact distal pulses.   Pulmonary/Chest: Effort normal and breath sounds normal. She has no wheezes. She has no rales. Right breast exhibits no inverted nipple, no mass, no nipple discharge, no skin change and no tenderness. Left breast exhibits no inverted nipple, no mass, no nipple discharge, no skin change and no tenderness. Breasts are symmetrical.  Lymphadenopathy:    She has no cervical adenopathy.    Data Reviewed Mammogram, pathology reviewed  Assessment    Right breast invasive ductal cancer    Plan    MRI bilateral breasts, med onc/rad onc appt   We discussed the staging and pathophysiology of breast cancer. We discussed all of the different options for treatment for breast cancer including surgery, chemotherapy, radiation therapy, Herceptin, and antiestrogen therapy.   We discussed an axillary node dissection at time of surgery.  We discussed risks including shoulder pain, lymphedema. We discussed the options for treatment of the breast cancer which included lumpectomy versus a mastectomy. II think there is good chance she may need mastectomy.  We could discuss primary systemic therapy as possibility also. We discussed the performance of the lumpectomy with a wire placement. We discussed a 10-20% chance of a positive margin requiring reexcision in the operating room. We also discussed that she may need radiation therapy or antiestrogen therapy or both if she undergoes lumpectomy. We discussed the mastectomy and the postoperative care for that as well. We discussed that there is no difference in her survival whether she undergoes lumpectomy with radiation therapy or antiestrogen therapy versus a mastectomy. There is a slight difference in the local recurrence rate being 3-5% with lumpectomy and  about 1% with a mastectomy. We discussed the risks of operation including bleeding, infection, possible reoperation. She understands her further therapy will be based on what her stages at the time of her operation.         WAKEFIELD,MATTHEW 10/13/2012, 8:34 PM

## 2012-11-05 NOTE — Op Note (Signed)
Preoperative diagnosis: Clinical stage II triple negative breast cancer Postoperative diagnosis: Same as above Procedure: Left subclavian power port insertion Surgeon: Dr. Harden Mo Anesthesia: Gen. With LMA Estimated blood loss: Minimal Specimens: None Drains: None Complications: None Sponge and needle count correct and of operation Disposition to recovery stable  Indications: This is a 72 year old female has recently been diagnosed with locally advanced left breast cancer. This is triple negative. We discussed surgery upfront. She's also been seen by medical oncology and we have decided to pursue primary chemotherapy first. Prior to this she and I discussed port placement and the risks and benefits associated with it.  Procedure: After informed consent was obtained the patient was taken to the operating room. She was administered cefazolin. She had sequential compression devices on her legs. She was then placed under general anesthesia with an LMA. Her arms were tucked and appropriate padded. Her chest was then prepped and draped in the standard sterile surgical fashion. Surgical timeout was performed.  I then infiltrated quarter percent Marcaine throughout the left chest and the clavicle. I then accessed the subclavian vein on the first pass. The wire was placed and confirmed by fluoroscopy. I then made a pocket below this. I tunneled the line between the 2 sites. I then dilated up the tract. I then inserted a dilator assembly under direct vision and removed the wire. The line was in place and the peel-away sheath. The peel-away sheath was removed. The line was then pulled back to be in the distal cava. I then attached this to the port. The port was sutured into position with 2-0 Prolene in 2 places. Final fluoroscopy showed that the line was not kinked and it was in good position. I then accessed the port. It aspirated blood and flushed easily. I packed this with heparin. I then closed this  with 3-0 Vicryl, 4 Monocryl, and Dermabond. She tolerated this well was transferred to recovery stable.

## 2012-11-06 ENCOUNTER — Telehealth (INDEPENDENT_AMBULATORY_CARE_PROVIDER_SITE_OTHER): Payer: Self-pay | Admitting: General Surgery

## 2012-11-06 ENCOUNTER — Other Ambulatory Visit: Payer: Self-pay | Admitting: Oncology

## 2012-11-06 ENCOUNTER — Encounter (HOSPITAL_BASED_OUTPATIENT_CLINIC_OR_DEPARTMENT_OTHER): Payer: Self-pay | Admitting: General Surgery

## 2012-11-06 ENCOUNTER — Ambulatory Visit (HOSPITAL_COMMUNITY)
Admission: RE | Admit: 2012-11-06 | Discharge: 2012-11-06 | Disposition: A | Discharge status: Home / Self Care | Payer: Medicare Other | Source: Ambulatory Visit | Attending: Oncology | Admitting: Oncology

## 2012-11-06 DIAGNOSIS — C50919 Malignant neoplasm of unspecified site of unspecified female breast: Secondary | ICD-10-CM | POA: Diagnosis not present

## 2012-11-06 DIAGNOSIS — F411 Generalized anxiety disorder: Secondary | ICD-10-CM | POA: Diagnosis not present

## 2012-11-06 DIAGNOSIS — M1990 Unspecified osteoarthritis, unspecified site: Secondary | ICD-10-CM | POA: Diagnosis not present

## 2012-11-06 DIAGNOSIS — C50911 Malignant neoplasm of unspecified site of right female breast: Secondary | ICD-10-CM

## 2012-11-06 DIAGNOSIS — I369 Nonrheumatic tricuspid valve disorder, unspecified: Secondary | ICD-10-CM

## 2012-11-06 DIAGNOSIS — I079 Rheumatic tricuspid valve disease, unspecified: Secondary | ICD-10-CM | POA: Insufficient documentation

## 2012-11-06 DIAGNOSIS — E039 Hypothyroidism, unspecified: Secondary | ICD-10-CM | POA: Insufficient documentation

## 2012-11-06 DIAGNOSIS — Z5111 Encounter for antineoplastic chemotherapy: Secondary | ICD-10-CM

## 2012-11-06 DIAGNOSIS — M810 Age-related osteoporosis without current pathological fracture: Secondary | ICD-10-CM | POA: Insufficient documentation

## 2012-11-06 DIAGNOSIS — I10 Essential (primary) hypertension: Secondary | ICD-10-CM | POA: Insufficient documentation

## 2012-11-06 DIAGNOSIS — IMO0001 Reserved for inherently not codable concepts without codable children: Secondary | ICD-10-CM | POA: Diagnosis not present

## 2012-11-06 DIAGNOSIS — E785 Hyperlipidemia, unspecified: Secondary | ICD-10-CM | POA: Diagnosis not present

## 2012-11-06 DIAGNOSIS — Z01818 Encounter for other preprocedural examination: Secondary | ICD-10-CM | POA: Diagnosis not present

## 2012-11-06 NOTE — Telephone Encounter (Signed)
Spoke with patient she states she needs no po f/u appt unless she has issues with port

## 2012-11-06 NOTE — Progress Notes (Signed)
  Echocardiogram 2D Echocardiogram has been performed.  Jorje Guild 11/06/2012, 2:18 PM

## 2012-11-07 ENCOUNTER — Encounter: Payer: Self-pay | Admitting: *Deleted

## 2012-11-07 ENCOUNTER — Other Ambulatory Visit: Payer: Medicare Other

## 2012-11-08 ENCOUNTER — Other Ambulatory Visit (HOSPITAL_COMMUNITY): Payer: Medicare Other

## 2012-11-08 ENCOUNTER — Ambulatory Visit (HOSPITAL_BASED_OUTPATIENT_CLINIC_OR_DEPARTMENT_OTHER): Payer: Medicare Other | Admitting: Oncology

## 2012-11-08 ENCOUNTER — Other Ambulatory Visit: Payer: Self-pay | Admitting: *Deleted

## 2012-11-08 ENCOUNTER — Telehealth: Payer: Self-pay | Admitting: Oncology

## 2012-11-08 VITALS — BP 167/82 | HR 108 | Temp 98.4°F | Resp 20 | Ht 69.0 in | Wt 168.8 lb

## 2012-11-08 DIAGNOSIS — C50111 Malignant neoplasm of central portion of right female breast: Secondary | ICD-10-CM

## 2012-11-08 DIAGNOSIS — C773 Secondary and unspecified malignant neoplasm of axilla and upper limb lymph nodes: Secondary | ICD-10-CM

## 2012-11-08 DIAGNOSIS — C50219 Malignant neoplasm of upper-inner quadrant of unspecified female breast: Secondary | ICD-10-CM

## 2012-11-08 DIAGNOSIS — R599 Enlarged lymph nodes, unspecified: Secondary | ICD-10-CM

## 2012-11-08 DIAGNOSIS — C50911 Malignant neoplasm of unspecified site of right female breast: Secondary | ICD-10-CM

## 2012-11-08 DIAGNOSIS — C50119 Malignant neoplasm of central portion of unspecified female breast: Secondary | ICD-10-CM

## 2012-11-08 DIAGNOSIS — Z171 Estrogen receptor negative status [ER-]: Secondary | ICD-10-CM | POA: Diagnosis not present

## 2012-11-08 MED ORDER — LIDOCAINE-PRILOCAINE 2.5-2.5 % EX CREA: TOPICAL_CREAM | CUTANEOUS | Status: DC | PRN

## 2012-11-08 MED ORDER — PROCHLORPERAZINE MALEATE 10 MG PO TABS: 10.0000 mg | ORAL_TABLET | Freq: Four times a day (QID) | ORAL | Status: DC | PRN

## 2012-11-08 MED ORDER — LORAZEPAM 0.5 MG PO TABS: 0.5000 mg | ORAL_TABLET | Freq: Every evening | ORAL | Status: DC | PRN

## 2012-11-08 MED ORDER — DEXAMETHASONE 4 MG PO TABS: ORAL_TABLET | ORAL | Status: DC

## 2012-11-08 NOTE — Progress Notes (Signed)
ID: Debra Mcintyre   DOB: 04-24-1941  MR#: [id number]  WUJ#:[id number]  PCP: [name], MD GYN: [name], [name], [name],   HISTORY OF PRESENT ILLNESS: Debra Mcintyre had screening mammography at [facility] 04/10/2012 raising the question of some axillary lymph nodes on the right. Additional views of the right axilla and right axillary ultrasound performed 04/04/2012 showed 3 lymph nodes that appeared larger than prior. The largest measured 1.5 cm. 2 of them had cortical thickening. Close followup was suggested, and repeat right axillary ultrasound 06/25/2012 showed no significant change in the lymph nodes in question. Followup ultrasound in 3 months was recommended, but in the interim the patient saw her primary physician, Dr. [name] and ADL, and he set her up for a chest CT on 07/08/2012, which showed a mild asymmetric density in the upper mid right breast. There was a prominent right axillary lymph node measuring 1.9 cm, with a few smaller adjacent nodes asymmetric with compared to the left side. Again, short interval followup was recommended, and on 10/07/2012 the patient had digital right mammography and ultrasonography, now at the breast Center. Dr. [name] noted pleomorphic calcifications spanning an area of 6.3 cm without associated mass. Physical exam was unremarkable. Ultrasound showed an area of adenopathy in the right axilla measuring 1.6 cm, with a second lymph node with a thickened cortex measuring 1.2 cm. No suspicious mass was seen by ultrasound in the right breast.  Biopsy of the larger axillary lymph node on 10/07/2012 showed ([id number]) an invasive ductal carcinoma, triple negative, with an MIB-1 of 66%. Biopsy of the right breast the next day, [id number]) showed invasive ductal carcinoma, grade 3. Breast MRI 10/14/2012 showed a total area of irregular enhancement in the right breast measuring up to 12 cm. MRI guided biopsy of an  area in the upper inner quadrant of the right breast on 10/23/2012 showed ([id number]) ductal carcinoma in situ, with foci worrisome for invasion.  The patient's subsequent history is as detailed below  INTERVAL HISTORY: Debra Mcintyre returns today for followup of her breast cancer. Since her last visit here she had her port placed, had her echo, came to "chemotherapy school", and "bought herself hair".  REVIEW OF SYSTEMS: She has a little soreness from the port, and minimal itching from the "blue". Otherwise a detailed review of systems today was entirely unremarkable.  PAST MEDICAL HISTORY: Past Medical History  Diagnosis Date  . Hypertension   . Cerebrovascular disease, unspecified   . Cerebral aneurysm, nonruptured   . Pure hypercholesterolemia   . Hypothyroidism   . Colonic polyp   . Hemorrhoid   . Degenerative joint disease   . Fibromyalgia   . Osteoporosis   . TIA (transient ischemic attack)   . Anxiety     PAST SURGICAL HISTORY: Past Surgical History  Procedure Laterality Date  . Left middle cerebral artery angioplasty  2004    by [name]  . Portacath placement N/A 11/05/2012    Procedure: INSERTION PORT-A-CATH;  Surgeon: [name], MD;  Location: [facility];  Service: General;  Laterality: N/A;    FAMILY HISTORY Family History  Problem Relation Age of Onset  . Lung cancer Mother   . Heart disease Father   . Colon cancer Brother   . Alcohol abuse Brother    the patient's father died at the age of 21. The patient's mother died at the age of 14 from lung cancer. She was not a  smoker. The cancer was diagnosed when she was 72 years old. The patient's mother had 2 sisters and one brother. One of those sisters had lung cancer and a brother had colon cancer, both diagnosed in their 60s. The patient herself has one brother who died from colon cancer at the age of 60. That brother has a daughter who was diagnosed with uterine cancer at the age of 24. All  this raises the question of possible Lynch syndrome and I have referred the patient for genetics counseling.  GYNECOLOGIC HISTORY: Menarche age 54, menopause around 83. The patient is GX P0. She took birth control pills for many years with no complications.  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 [name] who lives in "[place]" and is financial vice president of [employer], and Pearson who lives in [place] and works as a Transport planner. The patient has 5 grandchildren. She is currently not a church attender   ADVANCED DIRECTIVES: Not in place; this was discussed 10/29/2012 and the patient was advised to complete a healthcare power of attorney document  HEALTH MAINTENANCE: History  Substance Use Topics  . Smoking status: Former Smoker -- 1.00 packs/day for 20 years    Types: Cigarettes    Quit date: 04/02/1986  . Smokeless tobacco: Not on file  . Alcohol Use: No     Colonoscopy: 2012  PAP: 2012  Bone density:  Lipid panel:  No Known Allergies  Current Outpatient Prescriptions  Medication Sig Dispense Refill  . aspirin 325 MG tablet Take 325 mg by mouth daily.        . Calcium Carbonate-Vitamin D (CALCIUM 600+D) 600-200 MG-UNIT TABS Take 1 tablet by mouth 2 (two) times daily.        . clopidogrel (PLAVIX) 75 MG tablet TAKE ONE TABLET BY MOUTH EVERY DAY  30 tablet  6  . dexamethasone (DECADRON) 4 MG tablet Take 2 tablets by mouth once a day on the evening of chemotherapy and then take 2 tablets two times a day for 2 days. Take with food.  30 tablet  1  . levothyroxine (SYNTHROID, LEVOTHROID) 125 MCG tablet Take 1 tablet (125 mcg total) by mouth daily.  30 tablet  11  . lidocaine-prilocaine (EMLA) cream Apply topically as needed. Apply over port 1-2 hors before chemo and cover with plastic wrap  30 g  0  . lisinopril-hydrochlorothiazide (PRINZIDE) 20-12.5 MG  per tablet Take 1 tablet by mouth daily.  30 tablet  11  . LORazepam (ATIVAN) 0.5 MG tablet Take 1 tablet (0.5 mg total) by mouth at bedtime as needed (Nausea or vomiting).  30 tablet  0  . metoprolol succinate (TOPROL-XL) 100 MG 24 hr tablet TAKE ONE TABLET BY MOUTH EVERY DAY  30 tablet  11  . Multiple Vitamin (MULTIVITAMIN) capsule Take 1 capsule by mouth daily.        Marland Kitchen oxyCODONE-acetaminophen (ROXICET) 5-325 MG per tablet Take 1 tablet by mouth every 4 (four) hours as needed for pain.  15 tablet  0  . prochlorperazine (COMPAZINE) 10 MG tablet Take 1 tablet (10 mg total) by mouth every 6 (six) hours as needed (Nausea or vomiting).  30 tablet  1  . rosuvastatin (CRESTOR) 20 MG tablet Take 1 tablet (20 mg total) by mouth daily.  30 tablet  11  . venlafaxine XR (EFFEXOR-XR) 75 MG 24 hr capsule Take 1 capsule (  75 mg total) by mouth daily.  30 capsule  6   No current facility-administered medications for this visit.    OBJECTIVE: Middle-aged white woman who appears well Filed Vitals:   11/08/12 1403  BP: 167/82  Pulse: 108  Temp: 98.4 F (36.9 C)  Resp: 20     Body mass index is 24.92 kg/(m^2).    ECOG FS: 0  Sclerae unicteric Oropharynx clear No cervical or supraclavicular adenopathy; port is intact and easily palpable in the left upper anterior chest wall Lungs no rales or rhonchi Heart regular rate and rhythm Abd benign MSK scoliosis but no focal spinal tenderness, no peripheral edema Neuro: nonfocal, well oriented, positive affect Breasts: Deferred   LAB RESULTS: Lab Results  Component Value Date   WBC 7.0 10/29/2012   NEUTROABS 3.5 10/29/2012   HGB 13.8 10/29/2012   HCT 40.4 10/29/2012   MCV 86.3 10/29/2012   PLT 278 10/29/2012      Chemistry      Component Value Date/Time   NA 140 10/29/2012 1546   NA 138 06/04/2012 1124   K 3.8 10/29/2012 1546   K 4.3 06/04/2012 1124   CL 104 10/29/2012 1546   CL 103 06/04/2012 1124   CO2 28 10/29/2012 1546   CO2 29 06/04/2012 1124    BUN 12.4 10/29/2012 1546   BUN 14 06/04/2012 1124   CREATININE 0.8 10/29/2012 1546   CREATININE 0.7 06/04/2012 1124      Component Value Date/Time   CALCIUM 9.8 10/29/2012 1546   CALCIUM 9.9 06/04/2012 1124   ALKPHOS 57 10/29/2012 1546   ALKPHOS 47 06/04/2012 1124   AST 20 10/29/2012 1546   AST 27 06/04/2012 1124   ALT 21 10/29/2012 1546   ALT 32 06/04/2012 1124   BILITOT 0.47 10/29/2012 1546   BILITOT 1.1 06/04/2012 1124       Lab Results  Component Value Date   LABCA2 6 10/29/2012    No components found with this basename: [id number]    No results found for this basename: INR,  in the last 168 hours  Urinalysis    Component Value Date/Time   COLORURINE YELLOW 06/21/2008 2357   APPEARANCEUR CLEAR 06/21/2008 2357   LABSPEC 1.017 06/21/2008 2357   PHURINE 7.0 06/21/2008 2357   GLUCOSEU NEGATIVE 06/21/2008 2357   HGBUR NEGATIVE 06/21/2008 2357   BILIRUBINUR NEGATIVE 06/21/2008 2357   KETONESUR NEGATIVE 06/21/2008 2357   PROTEINUR NEGATIVE 06/21/2008 2357   UROBILINOGEN 0.2 06/21/2008 2357   NITRITE NEGATIVE 06/21/2008 2357   LEUKOCYTESUR MODERATE* 06/21/2008 2357    STUDIES: Mr Breast Bilateral W Wo Contrast  10/14/2012  *RADIOLOGY REPORT*  Clinical Data: 72 year old female with newly diagnosed right breast carcinoma  BUN and creatinine were obtained on site at [facility] at [address] Results:  BUN 8.0 mg/dL,  Creatinine 0.8 mg/dL.  BILATERAL BREAST MRI WITH AND WITHOUT CONTRAST  Technique: Multiplanar, multisequence MR images of both breasts were obtained prior to and following the intravenous administration of 15ml of Multihance.  Three dimensional images were evaluated at the independent DynaCad workstation.  Comparison:  10/08/2012 [facility]ogram [facility].  Prior mammograms and ultrasounds from [place].  Findings: Mild normal background parenchymal enhancement is noted.  RIGHT BREAST:  Irregular linear and stippled enhancement throughout the entire  upper and central right breast identified, measuring 8 x 12 x 7 cm (transverse x AP x CC).  Biopsy clip artifact and small hematoma within the anterior upper inner  right breast is identified.  LEFT BREAST:  No abnormal areas of enhancement are identified within the left breast.  LYMPH NODES:  An enlarged level I right axillary lymph node is identified with postbiopsy changes compatible with biopsy-proven metastatic neoplasm. No other enlarged lymph nodes are identified.  OTHER: No bony or upper hepatic abnormalities identified.  IMPRESSION: 8 x 12 x 7 cm irregular enhancement throughout the entire central and upper right breast and enlarged level I right axillary lymph node, compatible with neoplasm and lymph node metastasis.  If breast conservation is considered, sampling of the posterior aspect of the irregular enhancement may be warranted.  No evidence of contralateral neoplasm.  BI-RADS CATEGORY 6:  Known biopsy-proven malignancy - appropriate action should be taken.  RECOMMENDATION: Treatment plan.  Consider right breast sampling as discussed above.  THREE-DIMENSIONAL MR IMAGE RENDERING ON INDEPENDENT WORKSTATION:  Three-dimensional MR images were rendered by post-processing of the original MR data on an independent workstation.  The three- dimensional MR images were interpreted, and findings were reported in the accompanying complete MRI report for this study.   Original Report Authenticated By: Harmon Pier, M.D.    Nm Pet Image Initial (pi) Skull Base To Thigh  10/24/2012  *RADIOLOGY REPORT*  Clinical Data: Initial treatment strategy for newly diagnosed right breast cancer.  NUCLEAR MEDICINE PET SKULL BASE TO THIGH  Fasting Blood Glucose:  89  Technique:  17.6 mCi F-18 FDG was injected intravenously. CT data was obtained and used for attenuation correction and anatomic localization only.  (This was not acquired as a diagnostic CT examination.) Additional exam technical data entered on technologist  worksheet.  Comparison:  CT chest dated 07/08/2012  Findings:  Neck: No hypermetabolic lymph nodes in the neck.  Chest:  2.3 x 2.7 cm upper central right breast mass (series 2/image 85), max SUV 4.6, corresponding to the known right breast cancer.  10 mm short-axis right axillary node (series 2/image 76), max SUV 2.4.  Additional 7 mm short-axis right axillary node superiorly (series 2/image 62), max SUV 2.5.  No hypermetabolic mediastinal or hilar nodes.  No suspicious pulmonary nodules on the CT scan.  Coronary atherosclerosis.  Atherosclerotic calcifications of the aortic arch.  Abdomen/Pelvis:  No abnormal hypermetabolic activity within the liver, pancreas, adrenal glands, or spleen.  No hypermetabolic lymph nodes in the abdomen or pelvis.  Skeleton:  No focal hypermetabolic activity to suggest skeletal metastasis.  IMPRESSION: 2.7 cm upper central right breast mass, max SUV 4.6, corresponding to known right breast cancer.  Two hypermetabolic right axillary lymph nodes measuring up to 10 mm short axis, max SUV 2.5.  No evidence of distant metastases.   Original Report Authenticated By: Charline Bills, M.D.    Dg Chest Port 1 View  11/05/2012  *RADIOLOGY REPORT*  Clinical Data: Preop radiograph.  PORTABLE CHEST - 1 VIEW  Comparison: 10/24/2012  Findings: There is a left chest wall porta-catheter with tip in the cavoatrial junction.  The heart size appears normal.  No pleural effusion or edema.  Scarring noted within the right mid lung.  No airspace consolidation.  IMPRESSION:  1.  No acute cardiopulmonary abnormalities. 2.  No pneumothorax after port placement.  The tip is in the cavoatrial junction.   Original Report Authenticated By: Signa Kell, M.D.    Mm Digital Diagnostic Unilat R  10/23/2012  *RADIOLOGY REPORT*  Clinical Data:  abnormal enhancement posteromedial right breast superiorly, pt recently diagnosed with right breast cancer more anteriorly  DIGITAL DIAGNOSTIC RIGHT MAMMOGRAM  Comparison:   Previous exams.  Findings:  Films are performed following mri guided biopsy of right breast enhancement posteriorly in the upper inner quadrant.  The dumbbell shaped marker clip projects in the anticipated position.  IMPRESSION: Appropriate clip placement   Original Report Authenticated By: Esperanza Heir, M.D.    Dg Fluoro Guide Cv Line-no Report  11/05/2012  CLINICAL DATA: port placement   FLOURO GUIDE CV LINE  Fluoroscopy was utilized by the requesting physician.  No radiographic  interpretation.     Mr Rt Breast Bx Jones Bales Dev 1st Lesion Image Bx Spec Mr Guide  10/24/2012  **ADDENDUM** CREATED: 10/24/2012 12:09:06  I spoke with the patient by telephone on 10/24/2012 to discuss pathology results.  Pathology demonstrates:  Breast, right, needle core biopsy, UIQ  - DUCTAL CARCINOMA IN SITU WITH FOCI WORRISOME FOR INVASION.  - LYMPHOVASCULAR INVASION IDENTIFIED.  This biopsy site is approximately 8 cm posterior and superior to the original biopsy which also showed malignancy in the right breast.  The findings are also called to Dr. Dwain Sarna by Sonnie Alamo, R.N.  The patient reports no problems at the biopsy site.  All questions were answered.  Recommendations:  Surgical consultation  **END ADDENDUM** SIGNED BY: Blair Hailey. Manson Passey, M.D.   10/23/2012  *RADIOLOGY REPORT*  Clinical Data:  new diagnosis right breast cancer anteriorly; abnormal enhancement posterior right upper inner breast  MRI GUIDED VACUUM ASSISTED BIOPSY OF THE RIGHT BREAST WITHOUT AND WITH CONTRAST  Comparison: Previous exams.  Technique: Multiplanar, multisequence MR images of the right breast were obtained prior to and following the intravenous administration of 15 ml of Mulithance.  I met with the patient, and we discussed the procedure of MRI guided biopsy, including risks, benefits, and alternatives. Specifically, we discussed the risks of infection, bleeding, tissue injury, clip migration, and inadequate sampling.  Informed, written  consent was given.  Using sterile technique, 2% Lidocaine, MRI guidance, and a 9 gauge vacuum assisted device, biopsy was performed of the enhancement using a mediolateral approach.  At the conclusion of the procedure, a dumbbell shaped tissue marker clip was deployed into the biopsy cavity.  IMPRESSION: MRI guided biopsy of abnormal right breast enhancement. No apparent complications.  THREE-DIMENSIONAL MR IMAGE RENDERING ON INDEPENDENT WORKSTATION:  Three-dimensional MR images were rendered by post-processing of the original MR data on an independent workstation.  The three- dimensional MR images were interpreted, and findings were reported in the accompanying complete MRI report for this study.   Original Report Authenticated By: Esperanza Heir, M.D.      ASSESSMENT: 72 y.o. [place] woman status post right breast and right axillary lymph node biopsies February 2014 of a clinical T2, pN1, stage IIB,  invasive ductal carcinoma, grade 3, triple negative, with an MIB-1 of 15%  1) a third biopsy from a different quadrant 10/23/2012 showed ductal carcinoma in situ  (2) family history suggestive of Lynch syndrome; genetics evaluation in process  PLAN: We spent the better part of her hour-long visit today going over some of the remaining questions that she has (she has very carefully been reading all the material provided), and making this specific appointments for the next 2 months. I also wrote her the prescription for dexamethasone, prochlorperazine, numbing cream and lorazepam. I gave her a "map" explaining how to take these medications. At this point she is ready to get started. First treatment will be next week. She knows to call for any problems that may develop before the next visit.  Reynold Mantell  C    11/08/2012

## 2012-11-08 NOTE — Telephone Encounter (Signed)
gv pt appt schedule for March thru June.  °

## 2012-11-11 ENCOUNTER — Other Ambulatory Visit: Payer: Self-pay | Admitting: Oncology

## 2012-11-11 ENCOUNTER — Other Ambulatory Visit (HOSPITAL_BASED_OUTPATIENT_CLINIC_OR_DEPARTMENT_OTHER): Payer: Medicare Other | Admitting: Lab

## 2012-11-11 ENCOUNTER — Ambulatory Visit (HOSPITAL_BASED_OUTPATIENT_CLINIC_OR_DEPARTMENT_OTHER): Payer: Medicare Other

## 2012-11-11 VITALS — BP 145/70 | HR 78 | Temp 97.0°F | Resp 20

## 2012-11-11 DIAGNOSIS — D0590 Unspecified type of carcinoma in situ of unspecified breast: Secondary | ICD-10-CM

## 2012-11-11 DIAGNOSIS — Z5111 Encounter for antineoplastic chemotherapy: Secondary | ICD-10-CM

## 2012-11-11 DIAGNOSIS — R599 Enlarged lymph nodes, unspecified: Secondary | ICD-10-CM

## 2012-11-11 DIAGNOSIS — C50119 Malignant neoplasm of central portion of unspecified female breast: Secondary | ICD-10-CM

## 2012-11-11 DIAGNOSIS — C50911 Malignant neoplasm of unspecified site of right female breast: Secondary | ICD-10-CM

## 2012-11-11 DIAGNOSIS — C773 Secondary and unspecified malignant neoplasm of axilla and upper limb lymph nodes: Secondary | ICD-10-CM

## 2012-11-11 LAB — CBC WITH DIFFERENTIAL/PLATELET
BASO%: 0.9 % (ref 0.0–2.0)
Basophils Absolute: 0.1 10*3/uL (ref 0.0–0.1)
EOS%: 3.8 % (ref 0.0–7.0)
MCH: 29.2 pg (ref 25.1–34.0)
MCHC: 34 g/dL (ref 31.5–36.0)
MCV: 85.8 fL (ref 79.5–101.0)
MONO%: 8.7 % (ref 0.0–14.0)
RBC: 4.66 10*6/uL (ref 3.70–5.45)
RDW: 12.9 % (ref 11.2–14.5)
lymph#: 2.3 10*3/uL (ref 0.9–3.3)

## 2012-11-11 LAB — COMPREHENSIVE METABOLIC PANEL (CC13)
ALT: 19 U/L (ref 0–55)
AST: 18 U/L (ref 5–34)
Albumin: 3.5 g/dL (ref 3.5–5.0)
Alkaline Phosphatase: 55 U/L (ref 40–150)
BUN: 14.7 mg/dL (ref 7.0–26.0)
Calcium: 9.4 mg/dL (ref 8.4–10.4)
Chloride: 104 mEq/L (ref 98–107)
Potassium: 3.6 mEq/L (ref 3.5–5.1)

## 2012-11-11 MED ORDER — SODIUM CHLORIDE 0.9 % IV SOLN
600.0000 mg/m2 | Freq: Once | INTRAVENOUS | Status: AC
  Administered 2012-11-11: 1160 mg via INTRAVENOUS
  Filled 2012-11-11: qty 58

## 2012-11-11 MED ORDER — SODIUM CHLORIDE 0.9 % IJ SOLN
10.0000 mL | INTRAMUSCULAR | Status: DC | PRN
  Administered 2012-11-11: 10 mL
  Filled 2012-11-11: qty 10

## 2012-11-11 MED ORDER — DEXAMETHASONE SODIUM PHOSPHATE 4 MG/ML IJ SOLN
12.0000 mg | Freq: Once | INTRAMUSCULAR | Status: AC
  Administered 2012-11-11: 12 mg via INTRAVENOUS

## 2012-11-11 MED ORDER — SODIUM CHLORIDE 0.9 % IV SOLN
150.0000 mg | Freq: Once | INTRAVENOUS | Status: AC
  Administered 2012-11-11: 150 mg via INTRAVENOUS
  Filled 2012-11-11: qty 5

## 2012-11-11 MED ORDER — HEPARIN SOD (PORK) LOCK FLUSH 100 UNIT/ML IV SOLN
500.0000 [IU] | Freq: Once | INTRAVENOUS | Status: AC | PRN
  Administered 2012-11-11: 500 [IU]
  Filled 2012-11-11: qty 5

## 2012-11-11 MED ORDER — DOXORUBICIN HCL CHEMO IV INJECTION 2 MG/ML
60.0000 mg/m2 | Freq: Once | INTRAVENOUS | Status: AC
  Administered 2012-11-11: 116 mg via INTRAVENOUS
  Filled 2012-11-11: qty 58

## 2012-11-11 MED ORDER — SODIUM CHLORIDE 0.9 % IV SOLN
Freq: Once | INTRAVENOUS | Status: AC
  Administered 2012-11-11: 13:00:00 via INTRAVENOUS

## 2012-11-11 MED ORDER — PALONOSETRON HCL INJECTION 0.25 MG/5ML
0.2500 mg | Freq: Once | INTRAVENOUS | Status: AC
  Administered 2012-11-11: 0.25 mg via INTRAVENOUS

## 2012-11-11 NOTE — Patient Instructions (Addendum)
[facility] Discharge Instructions for Patients Receiving Chemotherapy  Today you received the following chemotherapy agents adriamycin, cytoxan  To help prevent nausea and vomiting after your treatment, we encourage you to take your nausea medication as directed by MD. Begin taking it at 7 pm and take it as often as needed.   If you develop nausea and vomiting that is not controlled by your nausea medication, call the clinic. If it is after clinic hours your family physician or the after hours number for the clinic or go to the Emergency Department.   BELOW ARE SYMPTOMS THAT SHOULD BE REPORTED IMMEDIATELY:  *FEVER GREATER THAN 100.5 F  *CHILLS WITH OR WITHOUT FEVER  NAUSEA AND VOMITING THAT IS NOT CONTROLLED WITH YOUR NAUSEA MEDICATION  *UNUSUAL SHORTNESS OF BREATH  *UNUSUAL BRUISING OR BLEEDING  TENDERNESS IN MOUTH AND THROAT WITH OR WITHOUT PRESENCE OF ULCERS  *URINARY PROBLEMS  *BOWEL PROBLEMS  UNUSUAL RASH Items with * indicate a potential emergency and should be followed up as soon as possible.  One of the nurses will contact you 24 hours after your treatment. Please let the nurse know about any problems that you may have experienced. Feel free to call the clinic you have any questions or concerns. The clinic phone number is [phone].   I have been informed and understand all the instructions given to me. I know to contact the clinic, my physician, or go to the Emergency Department if any problems should occur. I do not have any questions at this time, but understand that I may call the clinic during office hours   should I have any questions or need assistance in obtaining follow up care.    __________________________________________  _____________  __________ Signature of Patient or Authorized Representative            Date                   Time    __________________________________________ Nurse's Signature

## 2012-11-12 ENCOUNTER — Telehealth: Payer: Self-pay | Admitting: *Deleted

## 2012-11-12 ENCOUNTER — Other Ambulatory Visit: Payer: Self-pay | Admitting: Oncology

## 2012-11-12 ENCOUNTER — Ambulatory Visit (HOSPITAL_BASED_OUTPATIENT_CLINIC_OR_DEPARTMENT_OTHER): Payer: Medicare Other

## 2012-11-12 VITALS — BP 161/62 | HR 96 | Temp 97.8°F

## 2012-11-12 DIAGNOSIS — C50119 Malignant neoplasm of central portion of unspecified female breast: Secondary | ICD-10-CM

## 2012-11-12 DIAGNOSIS — C50911 Malignant neoplasm of unspecified site of right female breast: Secondary | ICD-10-CM

## 2012-11-12 DIAGNOSIS — R599 Enlarged lymph nodes, unspecified: Secondary | ICD-10-CM

## 2012-11-12 DIAGNOSIS — Z5189 Encounter for other specified aftercare: Secondary | ICD-10-CM | POA: Diagnosis not present

## 2012-11-12 MED ORDER — PEGFILGRASTIM INJECTION 6 MG/0.6ML
6.0000 mg | Freq: Once | SUBCUTANEOUS | Status: AC
  Administered 2012-11-12: 6 mg via SUBCUTANEOUS
  Filled 2012-11-12: qty 0.6

## 2012-11-12 NOTE — Telephone Encounter (Signed)
Debra Mcintyre here for Neulasta injection following 1st ac chemo treatment.  States that she is doing good, no nausea, vomiting, or diarrhea.  Drinking lots of fluids and eating well.  All questions answered   Knows to call the office if she has any problems, questions, or concerns.

## 2012-11-12 NOTE — Patient Instructions (Addendum)

## 2012-11-13 ENCOUNTER — Encounter: Payer: Self-pay | Admitting: Oncology

## 2012-11-14 ENCOUNTER — Ambulatory Visit
Admission: RE | Admit: 2012-11-14 | Discharge: 2012-11-14 | Disposition: A | Discharge status: Home / Self Care | Payer: Medicare Other | Source: Ambulatory Visit | Attending: Radiation Oncology | Admitting: Radiation Oncology

## 2012-11-14 ENCOUNTER — Encounter: Payer: Self-pay | Admitting: Radiation Oncology

## 2012-11-14 VITALS — BP 144/67 | HR 71 | Temp 98.3°F | Ht 69.0 in | Wt 168.2 lb

## 2012-11-14 DIAGNOSIS — C50111 Malignant neoplasm of central portion of right female breast: Secondary | ICD-10-CM

## 2012-11-14 DIAGNOSIS — C50911 Malignant neoplasm of unspecified site of right female breast: Secondary | ICD-10-CM

## 2012-11-14 DIAGNOSIS — C50919 Malignant neoplasm of unspecified site of unspecified female breast: Secondary | ICD-10-CM | POA: Diagnosis not present

## 2012-11-14 HISTORY — DX: Personal history of antineoplastic chemotherapy: Z92.21

## 2012-11-14 NOTE — Progress Notes (Addendum)
Debra Mcintyre here with her husband for consultation for right breast cancer.  Debra Mcintyre is currently receiving chemotherapy every 2 weeks.  She denies pain and fatigue.

## 2012-11-14 NOTE — Progress Notes (Signed)
Please see the Nurse Progress Note in the MD Initial Consult Encounter for this patient. 

## 2012-11-14 NOTE — Progress Notes (Signed)
[facility] Radiation Oncology NEW PATIENT EVALUATION  Name: Debra Mcintyre MRN: [id number]  Date:   11/14/2012           DOB: 1941-03-02  Status: outpatient   CC: [name], MD  [name], MD    REFERRING PHYSICIAN: [name], MD, Dr. Dr. 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 female who is seen today for the courtesy Dr. Dwain Sarna for evaluation of her T3 N1 invasive ductal/DCIS of the right breast. A screening mammogram in August of 2013 raised the question of questionable right axillary lymph nodes. A right axillary ultrasound in August showed 3 lymph nodes that appeared larger than seen previously. A followup ultrasound on 06/25/2012 did not show any significant interval change. A followup study was recommended. She did have a CT scan through her primary care physician, Dr. Alroy Dust and this showed mild asymmetry within the upper mid right breast. There was also a prominent right axillary lymph node measure 1.9 cm with a few smaller adjacent lymph nodes a. On 10/07/2012 she had mammography at the [facility] and she was seen to have pleomorphic calcifications spanning an area of 6.3 cm without associated mass. Ultrasound the right axilla showed a 1.6 cm, and also 1.2 cm axillary lymph node. Biopsy of the larger axillary lymph node 07/07/2013 showed invasive ductal carcinoma, triple negative with a proliferation index of 66%. Biopsy of the breast the following day confirmed invasive ductal carcinoma, grade 3. Breast MR on March 3 showed a total area of irregular enhancement in the right breast measuring up to 12 cm. MRI guided biopsy of the upper inner quadrant on 10/23/2012 showed DCIS with foci worrisome for invasion. Her staging workup included a PET scan on 10/24/2012 which showed a right primary breast mass in addition to 2  hypermetabolic right axillary lymph nodes. She was seen by Dr. Darnelle Catalan and plans to start neoadjuvant chemotherapy in the near future. She will be seeing Dr. Shella Spearing of plastic surgery for discussion of post radiation breast reconstruction. She is without complaints today. Lastly, she will be scheduled for genetic testing and based on her family history and a question of possible Lynch syndrome.   PREVIOUS RADIATION THERAPY: No   PAST MEDICAL HISTORY:  has a past medical history of Hypertension; Cerebrovascular disease, unspecified; Cerebral aneurysm, nonruptured; Pure hypercholesterolemia; Hypothyroidism; Colonic polyp; Breast cancer (09/2012); and History of chemotherapy.     PAST SURGICAL HISTORY:  Past Surgical History  Procedure Laterality Date  . Left middle cerebral artery angioplasty  2004    by [name] had 2 follow up occurances  . Portacath placement N/A 11/05/2012    Procedure: INSERTION PORT-A-CATH;  Surgeon: [name], MD;  Location: [facility];  Service: General;  Laterality: N/A;  . Breast biopsy Right 10/23/2012  . Breast biopsy Right 10/08/2012     FAMILY HISTORY: family history includes Alcohol abuse in her brother; Colon cancer in her brother and maternal uncle; Heart disease in her father; and Lung cancer in her mother. Her father died from complications of old age at 31. Her mother died from lung cancer 34. No family history of breast cancer.   SOCIAL HISTORY:  reports that she quit smoking about 26 years ago. Her smoking use included Cigarettes. She has a 20 pack-year smoking history. She does not have any smokeless tobacco  history on file. She reports that she does not drink alcohol or use illicit drugs. Married, 2 stepchildren. 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  ALLERGIES: Review of patient's allergies indicates no known allergies.   MEDICATIONS:  Current Outpatient Prescriptions  Medication  Sig Dispense Refill  . aspirin 325 MG tablet Take 325 mg by mouth daily.        . Calcium Carbonate-Vitamin D (CALCIUM 600+D) 600-200 MG-UNIT TABS Take 1 tablet by mouth 2 (two) times daily.        . clopidogrel (PLAVIX) 75 MG tablet TAKE ONE TABLET BY MOUTH EVERY DAY  30 tablet  6  . dexamethasone (DECADRON) 4 MG tablet Take 2 tablets by mouth once a day on the evening of chemotherapy and then take 2 tablets two times a day for 2 days. Take with food.  30 tablet  1  . levothyroxine (SYNTHROID, LEVOTHROID) 125 MCG tablet Take 1 tablet (125 mcg total) by mouth daily.  30 tablet  11  . lidocaine-prilocaine (EMLA) cream Apply topically as needed. Apply over port 1-2 hors before chemo and cover with plastic wrap  30 g  0  . loratadine (CLARITIN) 10 MG tablet Take 10 mg by mouth daily.      Marland Kitchen LORazepam (ATIVAN) 0.5 MG tablet Take 1 tablet (0.5 mg total) by mouth at bedtime as needed (Nausea or vomiting).  30 tablet  0  . metoprolol succinate (TOPROL-XL) 100 MG 24 hr tablet TAKE ONE TABLET BY MOUTH EVERY DAY  30 tablet  11  . Multiple Vitamin (MULTIVITAMIN) capsule Take 1 capsule by mouth daily.        . Naproxen Sodium (ALEVE) 220 MG CAPS Take 1 capsule by mouth as needed.      Marland Kitchen oxyCODONE-acetaminophen (ROXICET) 5-325 MG per tablet Take 1 tablet by mouth every 4 (four) hours as needed for pain.  15 tablet  0  . prochlorperazine (COMPAZINE) 10 MG tablet Take 1 tablet (10 mg total) by mouth every 6 (six) hours as needed (Nausea or vomiting).  30 tablet  1  . rosuvastatin (CRESTOR) 20 MG tablet Take 1 tablet (20 mg total) by mouth daily.  30 tablet  11  . venlafaxine XR (EFFEXOR-XR) 75 MG 24 hr capsule Take 1 capsule (75 mg total) by mouth daily.  30 capsule  6  . lisinopril-hydrochlorothiazide (PRINZIDE) 20-12.5 MG per tablet Take 1 tablet by mouth daily.  30 tablet  11   No current facility-administered medications for this encounter.     REVIEW OF SYSTEMS:  Pertinent items are noted in HPI.     PHYSICAL EXAM:  height is 5\' 9"  (1.753 m) and weight is 168 lb 3.2 oz (76.295 kg). Her temperature is 98.3 F (36.8 C). Her blood pressure is 144/67 and her pulse is 71.   Alert and oriented 73 year old white female appearing younger than her stated age. Head and neck examination: Grossly unremarkable. Nodes: Without palpable cervical, supraclavicular, or axillary lymphadenopathy. Chest: Left anterior Port-A-Cath, lungs clear. Heart: Regular in rhythm. Back: Without spinal or CVA tenderness.: There are 2 punctate biopsy scars along the upper inner quadrant of the left breast at 1:00 location, 1 closer to the nipple areolar complex and the other along the periphery of the upper-inner quadrant. There is some induration deep to each one of these biopsy wounds. No dominant masses are appreciated. Left breast without masses or lesions. Abdomen without hepatomegaly. Extremities: Without edema. Neurologic examination: Grossly  nonfocal.   LABORATORY DATA:  Lab Results  Component Value Date   WBC 7.8 11/11/2012   HGB 13.6 11/11/2012   HCT 40.0 11/11/2012   MCV 85.8 11/11/2012   PLT 213 11/11/2012   Lab Results  Component Value Date   NA 139 11/11/2012   K 3.6 11/11/2012   CL 104 11/11/2012   CO2 25 11/11/2012   Lab Results  Component Value Date   ALT 19 11/11/2012   AST 18 11/11/2012   ALKPHOS 55 11/11/2012   BILITOT 0.68 11/11/2012      IMPRESSION: Clinical stage IIIA (T3, N1, M0) triple negative invasive ductal carcinoma/DCIS of the right breast. She probably has extensive DCIS based on her mammography in addition to a T3 invasive primary. She understands that she will need a mastectomy and axillary dissection following neoadjuvant chemotherapy. If she has has a complete response within her breast and right axilla, I would still recommend post mastectomy radiation therapy based on her initial clinical stage and having triple-negative disease which both predict for a significant risk for local regional  recurrence. She stands that this will change her breast reconstruction options in that she will not be a candidate for a tissue expander/breast implant. We discussed the potential acute and late toxicities of radiation therapy including the risk for right upper extremity lymphedema. She will move ahead with chemotherapy, and I would like to see her in followup visit following her definitive surgery.. I believe that she plans on taking a cruise to [place] between finishing her chemotherapy in having her surgery with Dr. Dwain Sarna.   PLAN: As discussed above.   I spent 60 minutes minutes face to face with the patient and more than 50% of that time was spent in counseling and/or coordination of care.

## 2012-11-15 ENCOUNTER — Other Ambulatory Visit: Payer: Self-pay | Admitting: Emergency Medicine

## 2012-11-15 DIAGNOSIS — C50919 Malignant neoplasm of unspecified site of unspecified female breast: Secondary | ICD-10-CM

## 2012-11-18 ENCOUNTER — Other Ambulatory Visit (HOSPITAL_BASED_OUTPATIENT_CLINIC_OR_DEPARTMENT_OTHER): Payer: Medicare Other | Admitting: Lab

## 2012-11-18 ENCOUNTER — Ambulatory Visit (HOSPITAL_BASED_OUTPATIENT_CLINIC_OR_DEPARTMENT_OTHER): Payer: Medicare Other | Admitting: Physician Assistant

## 2012-11-18 ENCOUNTER — Encounter: Payer: Self-pay | Admitting: Physician Assistant

## 2012-11-18 VITALS — BP 106/69 | HR 97 | Temp 98.3°F | Resp 20 | Ht 69.0 in | Wt 165.7 lb

## 2012-11-18 DIAGNOSIS — C50911 Malignant neoplasm of unspecified site of right female breast: Secondary | ICD-10-CM

## 2012-11-18 DIAGNOSIS — C773 Secondary and unspecified malignant neoplasm of axilla and upper limb lymph nodes: Secondary | ICD-10-CM

## 2012-11-18 DIAGNOSIS — Z171 Estrogen receptor negative status [ER-]: Secondary | ICD-10-CM

## 2012-11-18 DIAGNOSIS — C50219 Malignant neoplasm of upper-inner quadrant of unspecified female breast: Secondary | ICD-10-CM

## 2012-11-18 DIAGNOSIS — M899 Disorder of bone, unspecified: Secondary | ICD-10-CM

## 2012-11-18 DIAGNOSIS — C50919 Malignant neoplasm of unspecified site of unspecified female breast: Secondary | ICD-10-CM

## 2012-11-18 DIAGNOSIS — D709 Neutropenia, unspecified: Secondary | ICD-10-CM

## 2012-11-18 DIAGNOSIS — M949 Disorder of cartilage, unspecified: Secondary | ICD-10-CM | POA: Diagnosis not present

## 2012-11-18 LAB — COMPREHENSIVE METABOLIC PANEL (CC13)
ALT: 12 U/L (ref 0–55)
AST: 11 U/L (ref 5–34)
Alkaline Phosphatase: 64 U/L (ref 40–150)
BUN: 13.6 mg/dL (ref 7.0–26.0)
Creatinine: 0.8 mg/dL (ref 0.6–1.1)
Total Bilirubin: 0.82 mg/dL (ref 0.20–1.20)

## 2012-11-18 LAB — CBC WITH DIFFERENTIAL/PLATELET
BASO%: 1.4 % (ref 0.0–2.0)
Basophils Absolute: 0 10*3/uL (ref 0.0–0.1)
EOS%: 8.9 % — ABNORMAL HIGH (ref 0.0–7.0)
HCT: 37.8 % (ref 34.8–46.6)
HGB: 12.9 g/dL (ref 11.6–15.9)
MCH: 29.4 pg (ref 25.1–34.0)
MCHC: 34.2 g/dL (ref 31.5–36.0)
MCV: 86 fL (ref 79.5–101.0)
MONO%: 7.7 % (ref 0.0–14.0)
NEUT%: 17.9 % — ABNORMAL LOW (ref 38.4–76.8)
RDW: 12.9 % (ref 11.2–14.5)
lymph#: 0.6 10*3/uL — ABNORMAL LOW (ref 0.9–3.3)

## 2012-11-18 MED ORDER — CIPROFLOXACIN HCL 500 MG PO TABS: 500.0000 mg | ORAL_TABLET | Freq: Two times a day (BID) | ORAL | Status: DC

## 2012-11-18 MED ORDER — OXYCODONE-ACETAMINOPHEN 5-325 MG PO TABS: 1.0000 | ORAL_TABLET | Freq: Four times a day (QID) | ORAL | Status: DC | PRN

## 2012-11-18 NOTE — Progress Notes (Signed)
ID: Debra Mcintyre   DOB: Jul 06, 1941  MR#: [id number]  WUJ#:[id number]  PCP: [name], MD GYN: [name], [name], [name],   HISTORY OF PRESENT ILLNESS: [name] had screening mammography at [facility] 04/10/2012 raising the question of some axillary lymph nodes on the right. Additional views of the right axilla and right axillary ultrasound performed 04/04/2012 showed 3 lymph nodes that appeared larger than prior. The largest measured 1.5 cm. 2 of them had cortical thickening. Close followup was suggested, and repeat right axillary ultrasound 06/25/2012 showed no significant change in the lymph nodes in question. Followup ultrasound in 3 months was recommended, but in the interim the patient saw her primary physician, Dr. [name] and ADL, and he set her up for a chest CT on 07/08/2012, which showed a mild asymmetric density in the upper mid right breast. There was a prominent right axillary lymph node measuring 1.9 cm, with a few smaller adjacent nodes asymmetric with compared to the left side. Again, short interval followup was recommended, and on 10/07/2012 the patient had digital right mammography and ultrasonography, now at the breast Center. Dr. [name] noted pleomorphic calcifications spanning an area of 6.3 cm without associated mass. Physical exam was unremarkable. Ultrasound showed an area of adenopathy in the right axilla measuring 1.6 cm, with a second lymph node with a thickened cortex measuring 1.2 cm. No suspicious mass was seen by ultrasound in the right breast.  Biopsy of the larger axillary lymph node on 10/07/2012 showed ([id number]) an invasive ductal carcinoma, triple negative, with an MIB-1 of 66%. Biopsy of the right breast the next day, [id number]) showed invasive ductal carcinoma, grade 3. Breast MRI 10/14/2012 showed a total area of irregular enhancement in the right breast measuring up to 12 cm. MRI guided biopsy of an  area in the upper inner quadrant of the right breast on 10/23/2012 showed ([id number]) ductal carcinoma in situ, with foci worrisome for invasion.  The patient's subsequent history is as detailed below  INTERVAL HISTORY: Debra Mcintyre returns today for followup of her locally advanced right breast cancer. Interval history is remarkable for Debra Mcintyre having initiated her neoadjuvant chemotherapy last week, with her first cycle of doxorubicin/cyclophosphamide given on 11/11/2012. She received Neulasta on day 2 as planned. She actually felt very well throughout the week, but began having significant any aches and pains on Saturday morning. These continued until late Sunday night. She alternated between oxycodone/APAP and Aleve, and also took Claritin as directed. Fortunately, this morning she is feeling much better, and the pain is beginning to resolve.  Otherwise, she tolerated her first cycle of chemotherapy very well. She took her medications appropriately and had no problems whatsoever with nausea or emesis. She's had some increased frequency with her bowel movements, but no loose stools have been noted. There's no blood or mucus in the stool.  REVIEW OF SYSTEMS: [name] denies any fevers or chills. She's had no skin changes, abnormal bruising, or abnormal bleeding. She's eating and drinking well, and keeping herself well hydrated. She's had no cough, increased shortness of breath, chest pain, or palpitations. She denies any abnormal headaches or dizziness. Her bony pain has improved significantly, and currently she denies any additional abnormal myalgias or arthralgias. She's had no peripheral swelling.  A detailed review of systems is otherwise noncontributory today.  PAST MEDICAL HISTORY: Past Medical History  Diagnosis Date  . Hypertension   . Cerebrovascular disease, unspecified   . Cerebral aneurysm,  nonruptured   . Pure hypercholesterolemia   . Hypothyroidism   . Colonic polyp   . Breast cancer  09/2012    right breast/right axillary lymph node t2,pn1, stage 11b, invasive ductal carcinma, grade 3, triple negative, with an mib-1 of 15%  . History of chemotherapy     PAST SURGICAL HISTORY: Past Surgical History  Procedure Laterality Date  . Left middle cerebral artery angioplasty  2004    by [name] had 2 follow up occurances  . Portacath placement N/A 11/05/2012    Procedure: INSERTION PORT-A-CATH;  Surgeon: [name], MD;  Location: [facility];  Service: General;  Laterality: N/A;  . Breast biopsy Right 10/23/2012  . Breast biopsy Right 10/08/2012    FAMILY HISTORY Family History  Problem Relation Age of Onset  . Lung cancer Mother   . Heart disease Father   . Colon cancer Brother   . Alcohol abuse Brother   . Colon cancer Maternal Uncle    the patient's father died at the age of 10. The patient's mother died at the age of 17 from lung cancer. She was not a smoker. The cancer was diagnosed when she was 72 years old. The patient's mother had 2 sisters and one brother. One of those sisters had lung cancer and a brother had colon cancer, both diagnosed in their 10s. The patient herself has one brother who died from colon cancer at the age of 40. That brother has a daughter who was diagnosed with uterine cancer at the age of 35. All this raises the question of possible Lynch syndrome and I have referred the patient for genetics counseling.  GYNECOLOGIC HISTORY: Menarche age 8, menopause around 74. The patient is GX P0. She took birth control pills for many years with no complications.  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 [name] who lives in "[place]" and is financial vice president of [employer], and Clyde who lives in [place] and works as a Transport planner. The patient has 5 grandchildren. She is currently not a church  attender   ADVANCED DIRECTIVES: Not in place; this was discussed 10/29/2012 and the patient was advised to complete a healthcare power of attorney document  HEALTH MAINTENANCE: History  Substance Use Topics  . Smoking status: Former Smoker -- 1.00 packs/day for 20 years    Types: Cigarettes    Quit date: 04/02/1986  . Smokeless tobacco: Never Used  . Alcohol Use: No     Colonoscopy: 2012  PAP: 2012  Bone density:  Lipid panel:  No Known Allergies  Current Outpatient Prescriptions  Medication Sig Dispense Refill  . aspirin 325 MG tablet Take 325 mg by mouth daily.        . Calcium Carbonate-Vitamin D (CALCIUM 600+D) 600-200 MG-UNIT TABS Take 1 tablet by mouth 2 (two) times daily.        . clopidogrel (PLAVIX) 75 MG tablet TAKE ONE TABLET BY MOUTH EVERY DAY  30 tablet  6  . dexamethasone (DECADRON) 4 MG tablet Take 2 tablets by mouth once a day on the evening of chemotherapy and then take 2 tablets two times a day for 2 days. Take with food.  30 tablet  1  . levothyroxine (SYNTHROID, LEVOTHROID) 125 MCG tablet Take 1 tablet (125 mcg total) by mouth daily.  30 tablet  11  . lidocaine-prilocaine (EMLA) cream Apply topically as  needed. Apply over port 1-2 hors before chemo and cover with plastic wrap  30 g  0  . loratadine (CLARITIN) 10 MG tablet Take 10 mg by mouth daily.      Marland Kitchen LORazepam (ATIVAN) 0.5 MG tablet Take 1 tablet (0.5 mg total) by mouth at bedtime as needed (Nausea or vomiting).  30 tablet  0  . metoprolol succinate (TOPROL-XL) 100 MG 24 hr tablet TAKE ONE TABLET BY MOUTH EVERY DAY  30 tablet  11  . Multiple Vitamin (MULTIVITAMIN) capsule Take 1 capsule by mouth daily.        . Naproxen Sodium (ALEVE) 220 MG CAPS Take 1 capsule by mouth as needed.      Marland Kitchen oxyCODONE-acetaminophen (ROXICET) 5-325 MG per tablet Take 1 tablet by mouth every 6 (six) hours as needed for pain.  20 tablet  0  . prochlorperazine (COMPAZINE) 10 MG tablet Take 1 tablet (10 mg total) by mouth every 6  (six) hours as needed (Nausea or vomiting).  30 tablet  1  . rosuvastatin (CRESTOR) 20 MG tablet Take 1 tablet (20 mg total) by mouth daily.  30 tablet  11  . venlafaxine XR (EFFEXOR-XR) 75 MG 24 hr capsule Take 1 capsule (75 mg total) by mouth daily.  30 capsule  6  . ciprofloxacin (CIPRO) 500 MG tablet Take 1 tablet (500 mg total) by mouth 2 (two) times daily.  14 tablet  3  . lisinopril-hydrochlorothiazide (PRINZIDE) 20-12.5 MG per tablet Take 1 tablet by mouth daily.  30 tablet  11   No current facility-administered medications for this visit.    OBJECTIVE: Middle-aged white woman who appears well Filed Vitals:   11/18/12 0953  BP: 106/69  Pulse: 97  Temp: 98.3 F (36.8 C)  Resp: 20     Body mass index is 24.46 kg/(m^2).    ECOG FS: 1 Filed Weights   11/18/12 0953  Weight: 165 lb 11.2 oz (75.161 kg)    Sclerae unicteric Oropharynx clear, no ulcerations or evidence of candidiasis. No cervical or supraclavicular adenopathy; Lungs clear to auscultation bilaterally with no rales or rhonchi Heart regular rate and rhythm Abdomen soft, nontender to palpation, positive bowel sounds MSK scoliosis but no focal spinal tenderness to palpation, no peripheral edema Neuro: nonfocal, well oriented, positive affect Breasts: Deferred   LAB RESULTS: Lab Results  Component Value Date   WBC 1.0* 11/18/2012   NEUTROABS 0.2* 11/18/2012   HGB 12.9 11/18/2012   HCT 37.8 11/18/2012   MCV 86.0 11/18/2012   PLT 119* 11/18/2012      Chemistry      Component Value Date/Time   NA 135* 11/18/2012 0924   NA 138 06/04/2012 1124   K 4.3 11/18/2012 0924   K 4.3 06/04/2012 1124   CL 101 11/18/2012 0924   CL 103 06/04/2012 1124   CO2 25 11/18/2012 0924   CO2 29 06/04/2012 1124   BUN 13.6 11/18/2012 0924   BUN 14 06/04/2012 1124   CREATININE 0.8 11/18/2012 0924   CREATININE 0.7 06/04/2012 1124      Component Value Date/Time   CALCIUM 9.5 11/18/2012 0924   CALCIUM 9.9 06/04/2012 1124   ALKPHOS 64 11/18/2012 0924    ALKPHOS 47 06/04/2012 1124   AST 11 11/18/2012 0924   AST 27 06/04/2012 1124   ALT 12 11/18/2012 0924   ALT 32 06/04/2012 1124   BILITOT 0.82 11/18/2012 0924   BILITOT 1.1 06/04/2012 1124       Lab Results  Component Value Date   LABCA2 6 10/29/2012    STUDIES:  Dg Chest Port 1 View  11/05/2012  *RADIOLOGY REPORT*  Clinical Data: Preop radiograph.  PORTABLE CHEST - 1 VIEW  Comparison: 10/24/2012  Findings: There is a left chest wall porta-catheter with tip in the cavoatrial junction.  The heart size appears normal.  No pleural effusion or edema.  Scarring noted within the right mid lung.  No airspace consolidation.  IMPRESSION:  1.  No acute cardiopulmonary abnormalities. 2.  No pneumothorax after port placement.  The tip is in the cavoatrial junction.   Original Report Authenticated By: Signa Kell, M.D.      ASSESSMENT: 72 y.o.  woman   (1)  status post right breast and right axillary lymph node biopsies February 2014 of a clinical T2, pN1, stage IIB,  invasive ductal carcinoma, grade 3, triple negative, with an MIB-1 of 15%  (2)  a third biopsy from a different quadrant 10/23/2012 showed ductal carcinoma in situ  (3)  currently being treated in the neoadjuvant setting, the goal being to complete 4 dose dense cycles of doxorubicin/cyclophosphamide followed by 12 weekly doses of paclitaxel prior to definitive surgery. First dose of chemotherapy was given on 11/11/2012.  (4) family history suggestive of Lynch syndrome; genetics evaluation in process  PLAN:  With the exception of the bony pain over the weekend, Keirston tolerated the first cycle of chemo extremely well. I am refilling her oxycodone/APAP which she is taking appropriately for the bony aches and pains. Jossette will also be started on Cipro prophylactically, 500 mg twice daily for 7 days.  We did review neutropenic precautions and she understands to contact us with any fevers of 100 or above.  I will make note of 2  events coming up for [facility] in the next few months. At this time the plan is to treat with 4 dose dense cycles of AC followed by 12 weekly doses of paclitaxel. She's typically treated on Mondays. She has a family graduation to attend on June 9, and when we get a little closer, we will move her treatment from the ninth to the 10th of that week. She's also planning a cruise with her husband for the last week of August. If all goes well, she will have completed her last dose of paclitaxel on August 11, will travel 2 weeks later on the week of August 25, and hopes to have her definitive surgery in early September.  All this was reviewed in detail today. The patient knows to contact us with any fevers or chills, and also with any problems or questions that arise prior to her next appointment. Otherwise she'll return next week on April 14 for followup with Dr. Darnelle Catalan in anticipation of day 1 cycle 2 of doxorubicin/cyclophosphamide.   Tiran Sauseda    11/18/2012

## 2012-11-22 ENCOUNTER — Other Ambulatory Visit: Payer: Self-pay | Admitting: *Deleted

## 2012-11-22 DIAGNOSIS — C50919 Malignant neoplasm of unspecified site of unspecified female breast: Secondary | ICD-10-CM

## 2012-11-25 ENCOUNTER — Other Ambulatory Visit (HOSPITAL_BASED_OUTPATIENT_CLINIC_OR_DEPARTMENT_OTHER): Payer: Medicare Other | Admitting: Lab

## 2012-11-25 ENCOUNTER — Ambulatory Visit (HOSPITAL_BASED_OUTPATIENT_CLINIC_OR_DEPARTMENT_OTHER): Payer: Medicare Other

## 2012-11-25 ENCOUNTER — Telehealth: Payer: Self-pay | Admitting: *Deleted

## 2012-11-25 ENCOUNTER — Ambulatory Visit (HOSPITAL_BASED_OUTPATIENT_CLINIC_OR_DEPARTMENT_OTHER): Payer: Medicare Other | Admitting: Oncology

## 2012-11-25 VITALS — BP 119/68 | HR 80 | Temp 98.6°F | Resp 20 | Ht 69.0 in | Wt 165.5 lb

## 2012-11-25 DIAGNOSIS — C50919 Malignant neoplasm of unspecified site of unspecified female breast: Secondary | ICD-10-CM

## 2012-11-25 DIAGNOSIS — C773 Secondary and unspecified malignant neoplasm of axilla and upper limb lymph nodes: Secondary | ICD-10-CM

## 2012-11-25 DIAGNOSIS — C50219 Malignant neoplasm of upper-inner quadrant of unspecified female breast: Secondary | ICD-10-CM

## 2012-11-25 DIAGNOSIS — C50911 Malignant neoplasm of unspecified site of right female breast: Secondary | ICD-10-CM

## 2012-11-25 DIAGNOSIS — C50111 Malignant neoplasm of central portion of right female breast: Secondary | ICD-10-CM

## 2012-11-25 DIAGNOSIS — Z5111 Encounter for antineoplastic chemotherapy: Secondary | ICD-10-CM

## 2012-11-25 DIAGNOSIS — Z171 Estrogen receptor negative status [ER-]: Secondary | ICD-10-CM | POA: Diagnosis not present

## 2012-11-25 LAB — CBC WITH DIFFERENTIAL/PLATELET
Basophils Absolute: 0.1 10*3/uL (ref 0.0–0.1)
EOS%: 0.2 % (ref 0.0–7.0)
HCT: 36.4 % (ref 34.8–46.6)
HGB: 12.4 g/dL (ref 11.6–15.9)
LYMPH%: 16.6 % (ref 14.0–49.7)
MCH: 28.8 pg (ref 25.1–34.0)
MCHC: 34.1 g/dL (ref 31.5–36.0)
MCV: 84.7 fL (ref 79.5–101.0)
MONO%: 8.8 % (ref 0.0–14.0)
NEUT%: 73.6 % (ref 38.4–76.8)
Platelets: 141 10*3/uL — ABNORMAL LOW (ref 145–400)
lymph#: 1.7 10*3/uL (ref 0.9–3.3)

## 2012-11-25 MED ORDER — SODIUM CHLORIDE 0.9 % IV SOLN
Freq: Once | INTRAVENOUS | Status: AC
  Administered 2012-11-25: 14:00:00 via INTRAVENOUS

## 2012-11-25 MED ORDER — DEXAMETHASONE SODIUM PHOSPHATE 4 MG/ML IJ SOLN
12.0000 mg | Freq: Once | INTRAMUSCULAR | Status: AC
  Administered 2012-11-25: 12 mg via INTRAVENOUS

## 2012-11-25 MED ORDER — SODIUM CHLORIDE 0.9 % IV SOLN
600.0000 mg/m2 | Freq: Once | INTRAVENOUS | Status: AC
  Administered 2012-11-25: 1160 mg via INTRAVENOUS
  Filled 2012-11-25: qty 58

## 2012-11-25 MED ORDER — DOXORUBICIN HCL CHEMO IV INJECTION 2 MG/ML
60.0000 mg/m2 | Freq: Once | INTRAVENOUS | Status: AC
  Administered 2012-11-25: 116 mg via INTRAVENOUS
  Filled 2012-11-25: qty 58

## 2012-11-25 MED ORDER — SODIUM CHLORIDE 0.9 % IJ SOLN
10.0000 mL | INTRAMUSCULAR | Status: DC | PRN
  Administered 2012-11-25: 10 mL
  Filled 2012-11-25: qty 10

## 2012-11-25 MED ORDER — PALONOSETRON HCL INJECTION 0.25 MG/5ML
0.2500 mg | Freq: Once | INTRAVENOUS | Status: AC
  Administered 2012-11-25: 0.25 mg via INTRAVENOUS

## 2012-11-25 MED ORDER — SODIUM CHLORIDE 0.9 % IV SOLN
150.0000 mg | Freq: Once | INTRAVENOUS | Status: AC
  Administered 2012-11-25: 150 mg via INTRAVENOUS
  Filled 2012-11-25: qty 5

## 2012-11-25 MED ORDER — HEPARIN SOD (PORK) LOCK FLUSH 100 UNIT/ML IV SOLN
500.0000 [IU] | Freq: Once | INTRAVENOUS | Status: AC | PRN
  Administered 2012-11-25: 500 [IU]
  Filled 2012-11-25: qty 5

## 2012-11-25 NOTE — Progress Notes (Signed)
ID: Debra Mcintyre   DOB: [birth year]  MR#: [id number]  XBJ#:[id number]  PCP: [name], MD GYN: [name], [name], [name],   HISTORY OF PRESENT ILLNESS: Debra Mcintyre had screening mammography at [facility] 04/10/2012 raising the question of some axillary lymph nodes on the right. Additional views of the right axilla and right axillary ultrasound performed 04/04/2012 showed 3 lymph nodes that appeared larger than prior. The largest measured 1.5 cm. 2 of them had cortical thickening. Close followup was suggested, and repeat right axillary ultrasound 06/25/2012 showed no significant change in the lymph nodes in question. Followup ultrasound in 3 months was recommended, but in the interim the patient saw her primary physician, Dr. [name] and ADL, and he set her up for a chest CT on 07/08/2012, which showed a mild asymmetric density in the upper mid right breast. There was a prominent right axillary lymph node measuring 1.9 cm, with a few smaller adjacent nodes asymmetric with compared to the left side. Again, short interval followup was recommended, and on 10/07/2012 the patient had digital right mammography and ultrasonography, now at the breast Center. Dr. [name] noted pleomorphic calcifications spanning an area of 6.3 cm without associated mass. Physical exam was unremarkable. Ultrasound showed an area of adenopathy in the right axilla measuring 1.6 cm, with a second lymph node with a thickened cortex measuring 1.2 cm. No suspicious mass was seen by ultrasound in the right breast.  Biopsy of the larger axillary lymph node on 10/07/2012 showed ([id number]) an invasive ductal carcinoma, triple negative, with an MIB-1 of 66%. Biopsy of the right breast the next day, [id number]) showed invasive ductal carcinoma, grade 3. Breast MRI 10/14/2012 showed a total area of irregular enhancement in the right breast measuring up to 12 cm. MRI guided biopsy of an  area in the upper inner quadrant of the right breast on 10/23/2012 showed ([id number]) ductal carcinoma in situ, with foci worrisome for invasion.  The patient's subsequent history is as detailed below  INTERVAL HISTORY: Debra Mcintyre returns today for followup of her locally advanced right breast cancer. Today is day 1 cycle 2 of her 4 planned "AC" chemotherapy treatments, to be followed by weekly paclitaxel/ carboplatin.  REVIEW OF SYSTEMS: She describes cycle one as "uneventful". Specifically she had no problems with nausea or vomiting. She did develop significant bony pain from the Neulasta. This centered on her upper legs. She took alternating Aleve and Percocet and that took care of the problem. She did not get particularly constipated. She describes her energy is "pretty good" although she takes occasional naps now, which she did not do before. She is trying to continue to go to the gym on a regular basis. Otherwise a detailed review of systems today was noncontributory.  PAST MEDICAL HISTORY: Past Medical History  Diagnosis Date  . Hypertension   . Cerebrovascular disease, unspecified   . Cerebral aneurysm, nonruptured   . Pure hypercholesterolemia   . Hypothyroidism   . Colonic polyp   . Breast cancer 09/2012    right breast/right axillary lymph node t2,pn1, stage 11b, invasive ductal carcinma, grade 3, triple negative, with an mib-1 of 15%  . History of chemotherapy     PAST SURGICAL HISTORY: Past Surgical History  Procedure Laterality Date  . Left middle cerebral artery angioplasty  2004    by [name] had 2 follow up occurances  . Portacath placement N/A 11/05/2012    Procedure: INSERTION PORT-A-CATH;  Surgeon:  [name], MD;  Location: [facility];  Service: General;  Laterality: N/A;  . Breast biopsy Right 10/23/2012  . Breast biopsy Right 10/08/2012    FAMILY HISTORY Family History  Problem Relation Age of Onset  . Lung cancer Mother   . Heart  disease Father   . Colon cancer Brother   . Alcohol abuse Brother   . Colon cancer Maternal Uncle    the patient's father died at the age of 29. The patient's mother died at the age of 3 from lung cancer. She was not a smoker. The cancer was diagnosed when she was 70 years old. The patient's mother had 2 sisters and one brother. One of those sisters had lung cancer and a brother had colon cancer, both diagnosed in their 62s. The patient herself has one brother who died from colon cancer at the age of 52. That brother has a daughter who was diagnosed with uterine cancer at the age of 53. All this raises the question of possible Lynch syndrome and I have referred the patient for genetics counseling.  GYNECOLOGIC HISTORY: Menarche age 49, menopause around 89. The patient is GX P0. She took birth control pills for many years with no complications.  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 [name] who lives in "[place]" and is financial vice president of [employer], and [place] who lives in [place] and works as a Transport planner. The patient has 5 grandchildren. She is currently not a church attender   ADVANCED DIRECTIVES: Not in place; this was discussed 10/29/2012 and the patient was advised to complete a healthcare power of attorney document  HEALTH MAINTENANCE: History  Substance Use Topics  . Smoking status: Former Smoker -- 1.00 packs/day for 20 years    Types: Cigarettes    Quit date: 04/02/1986  . Smokeless tobacco: Never Used  . Alcohol Use: No     Colonoscopy: 2012  PAP: 2012  Bone density:  Lipid panel:  No Known Allergies  Current Outpatient Prescriptions  Medication Sig Dispense Refill  . aspirin 325 MG tablet Take 325 mg by mouth daily.        . Calcium Carbonate-Vitamin D (CALCIUM 600+D) 600-200 MG-UNIT TABS Take 1 tablet by mouth 2 (two) times  daily.        . ciprofloxacin (CIPRO) 500 MG tablet Take 1 tablet (500 mg total) by mouth 2 (two) times daily.  14 tablet  3  . clopidogrel (PLAVIX) 75 MG tablet TAKE ONE TABLET BY MOUTH EVERY DAY  30 tablet  6  . dexamethasone (DECADRON) 4 MG tablet Take 2 tablets by mouth once a day on the evening of chemotherapy and then take 2 tablets two times a day for 2 days. Take with food.  30 tablet  1  . levothyroxine (SYNTHROID, LEVOTHROID) 125 MCG tablet Take 1 tablet (125 mcg total) by mouth daily.  30 tablet  11  . lidocaine-prilocaine (EMLA) cream Apply topically as needed. Apply over port 1-2 hors before chemo and cover with plastic wrap  30 g  0  . lisinopril-hydrochlorothiazide (PRINZIDE) 20-12.5 MG per tablet Take 1 tablet by mouth daily.  30 tablet  11  . loratadine (CLARITIN) 10 MG tablet Take 10 mg by mouth daily.      Marland Kitchen LORazepam (ATIVAN) 0.5 MG tablet Take 1 tablet (0.5 mg total) by mouth at bedtime as  needed (Nausea or vomiting).  30 tablet  0  . metoprolol succinate (TOPROL-XL) 100 MG 24 hr tablet TAKE ONE TABLET BY MOUTH EVERY DAY  30 tablet  11  . Multiple Vitamin (MULTIVITAMIN) capsule Take 1 capsule by mouth daily.        . Naproxen Sodium (ALEVE) 220 MG CAPS Take 1 capsule by mouth as needed.      Marland Kitchen oxyCODONE-acetaminophen (ROXICET) 5-325 MG per tablet Take 1 tablet by mouth every 6 (six) hours as needed for pain.  20 tablet  0  . prochlorperazine (COMPAZINE) 10 MG tablet Take 1 tablet (10 mg total) by mouth every 6 (six) hours as needed (Nausea or vomiting).  30 tablet  1  . rosuvastatin (CRESTOR) 20 MG tablet Take 1 tablet (20 mg total) by mouth daily.  30 tablet  11  . venlafaxine XR (EFFEXOR-XR) 75 MG 24 hr capsule Take 1 capsule (75 mg total) by mouth daily.  30 capsule  6   No current facility-administered medications for this visit.    OBJECTIVE: Middle-aged white woman in no acute distress Filed Vitals:   11/25/12 1226  BP: 119/68  Pulse: 80  Temp: 98.6 F (37 C)   Resp: 20     Body mass index is 24.43 kg/(m^2).    ECOG FS: 1 Filed Weights   11/25/12 1226  Weight: 165 lb 8 oz (75.07 kg)    Sclerae unicteric Oropharynx clear, no ulcerations or evidence of candidiasis. No cervical or supraclavicular adenopathy; Lungs clear to auscultation bilaterally with no rales or rhonchi Heart regular rate and rhythm Abdomen soft, nontender to palpation, positive bowel sounds MSK scoliosis but no focal spinal tenderness to palpation, no peripheral edema Neuro: nonfocal, well oriented, positive affect Breasts: I do not palpate any masses in the right breast or right axilla or the left breast is unremarkable   LAB RESULTS: Lab Results  Component Value Date   WBC 10.3 11/25/2012   NEUTROABS 7.6* 11/25/2012   HGB 12.4 11/25/2012   HCT 36.4 11/25/2012   MCV 84.7 11/25/2012   PLT 141* 11/25/2012      Chemistry      Component Value Date/Time   NA 135* 11/18/2012 0924   NA 138 06/04/2012 1124   K 4.3 11/18/2012 0924   K 4.3 06/04/2012 1124   CL 101 11/18/2012 0924   CL 103 06/04/2012 1124   CO2 25 11/18/2012 0924   CO2 29 06/04/2012 1124   BUN 13.6 11/18/2012 0924   BUN 14 06/04/2012 1124   CREATININE 0.8 11/18/2012 0924   CREATININE 0.7 06/04/2012 1124      Component Value Date/Time   CALCIUM 9.5 11/18/2012 0924   CALCIUM 9.9 06/04/2012 1124   ALKPHOS 64 11/18/2012 0924   ALKPHOS 47 06/04/2012 1124   AST 11 11/18/2012 0924   AST 27 06/04/2012 1124   ALT 12 11/18/2012 0924   ALT 32 06/04/2012 1124   BILITOT 0.82 11/18/2012 0924   BILITOT 1.1 06/04/2012 1124       Lab Results  Component Value Date   LABCA2 6 10/29/2012    STUDIES: Dg Chest Port 1 View  11/05/2012  *RADIOLOGY REPORT*  Clinical Data: Preop radiograph.  PORTABLE CHEST - 1 VIEW  Comparison: 10/24/2012  Findings: There is a left chest wall porta-catheter with tip in the cavoatrial junction.  The heart size appears normal.  No pleural effusion or edema.  Scarring noted within the right mid lung.  No  airspace consolidation.  IMPRESSION:  1.  No acute cardiopulmonary abnormalities. 2.  No pneumothorax after port placement.  The tip is in the cavoatrial junction.   Original Report Authenticated By: Signa Kell, M.D.    Dg Fluoro Guide Cv Line-no Report  11/05/2012  CLINICAL DATA: port placement   FLOURO GUIDE CV LINE  Fluoroscopy was utilized by the requesting physician.  No radiographic  interpretation.        ASSESSMENT: 72 y.o. [place] woman   (1)  status post right breast and right axillary lymph node biopsies February 2014 of a clinical T2, pN1, stage IIB,  invasive ductal carcinoma, grade 3, triple negative, with an MIB-1 of 15%  (2)  a third biopsy from a different quadrant 10/23/2012 showed ductal carcinoma in situ  (3)  currently being treated in the neoadjuvant setting, the goal being to complete 4 dose dense cycles of doxorubicin/cyclophosphamide followed by 12 weekly doses of paclitaxel and carboplatin prior to definitive surgery. First dose of chemotherapy was given on 11/11/2012.  (4) family history suggestive of Lynch syndrome; genetics evaluation in process  PLAN:  Sequita is proceeding to her second cycle of chemotherapy today. She is tolerating treatment well, and I am not making any changes in her supportive medications. I did we write her Percocet so she can better deal with the Neulasta induced bony pain.  Because we do not have a measurable mass by palpation in the breast I am setting her up for a breast MRI after her first 4 cycles of chemotherapy. We will likely add carboplatin weekly to the weekly paclitaxel planned for her second set of chemotherapy treatments. She knows to call for any problems that may develop before the next visit.  Raudel Bazen C    11/25/2012

## 2012-11-25 NOTE — Patient Instructions (Signed)
[facility] Discharge Instructions for Patients Receiving Chemotherapy  Today you received the following chemotherapy agents: Adriamycin and Cytoxan. To help prevent nausea and vomiting after your treatment, we encourage you to take your nausea medication, Dexamethasone (Decadron). Begin taking it at bedtime and take it twice daily for the next 48 hours. Take Compazine (Prochlorperazine) every six hours as needed for nausea. (This may make you drowsy.) You may take Lorazepam at bedtime for nausea or sleep. Do not drive after taking this medication.    If you develop nausea and vomiting that is not controlled by your nausea medication, call the clinic. If it is after clinic hours your family physician or the after hours number for the clinic or go to the Emergency Department.   BELOW ARE SYMPTOMS THAT SHOULD BE REPORTED IMMEDIATELY:  *FEVER GREATER THAN 100.5 F  *CHILLS WITH OR WITHOUT FEVER  NAUSEA AND VOMITING THAT IS NOT CONTROLLED WITH YOUR NAUSEA MEDICATION  *UNUSUAL SHORTNESS OF BREATH  *UNUSUAL BRUISING OR BLEEDING  TENDERNESS IN MOUTH AND THROAT WITH OR WITHOUT PRESENCE OF ULCERS  *URINARY PROBLEMS  *BOWEL PROBLEMS  UNUSUAL RASH Items with * indicate a potential emergency and should be followed up as soon as possible.  Feel free to call the clinic you have any questions or concerns. The clinic phone number is [phone].   I have been informed and understand all the instructions given to me. I know to contact the clinic, my physician, or go to the Emergency Department if any problems should occur. I do not have any questions at this time, but understand that I may call the clinic during office hours   should I have any questions or need assistance in obtaining follow up care.

## 2012-11-25 NOTE — Telephone Encounter (Signed)
gv appt for MRI scheduled for 12/30/12 @ 9:30am..the patient is aware...td

## 2012-11-26 ENCOUNTER — Ambulatory Visit (HOSPITAL_BASED_OUTPATIENT_CLINIC_OR_DEPARTMENT_OTHER): Payer: Medicare Other

## 2012-11-26 VITALS — BP 141/62 | HR 86 | Temp 98.3°F

## 2012-11-26 DIAGNOSIS — C50219 Malignant neoplasm of upper-inner quadrant of unspecified female breast: Secondary | ICD-10-CM

## 2012-11-26 DIAGNOSIS — C773 Secondary and unspecified malignant neoplasm of axilla and upper limb lymph nodes: Secondary | ICD-10-CM | POA: Diagnosis not present

## 2012-11-26 DIAGNOSIS — R599 Enlarged lymph nodes, unspecified: Secondary | ICD-10-CM

## 2012-11-26 DIAGNOSIS — C50911 Malignant neoplasm of unspecified site of right female breast: Secondary | ICD-10-CM

## 2012-11-26 DIAGNOSIS — C50119 Malignant neoplasm of central portion of unspecified female breast: Secondary | ICD-10-CM

## 2012-11-26 MED ORDER — PEGFILGRASTIM INJECTION 6 MG/0.6ML
6.0000 mg | Freq: Once | SUBCUTANEOUS | Status: AC
  Administered 2012-11-26: 6 mg via SUBCUTANEOUS
  Filled 2012-11-26: qty 0.6

## 2012-11-29 ENCOUNTER — Other Ambulatory Visit: Payer: Self-pay | Admitting: *Deleted

## 2012-11-29 DIAGNOSIS — C50919 Malignant neoplasm of unspecified site of unspecified female breast: Secondary | ICD-10-CM

## 2012-11-30 ENCOUNTER — Encounter: Payer: Self-pay | Admitting: Oncology

## 2012-11-30 ENCOUNTER — Encounter: Payer: Self-pay | Admitting: Pulmonary Disease

## 2012-12-02 ENCOUNTER — Encounter: Payer: Self-pay | Admitting: Family

## 2012-12-02 ENCOUNTER — Encounter: Payer: Self-pay | Admitting: *Deleted

## 2012-12-02 ENCOUNTER — Ambulatory Visit (HOSPITAL_BASED_OUTPATIENT_CLINIC_OR_DEPARTMENT_OTHER): Payer: Medicare Other | Admitting: Family

## 2012-12-02 ENCOUNTER — Other Ambulatory Visit (HOSPITAL_BASED_OUTPATIENT_CLINIC_OR_DEPARTMENT_OTHER): Payer: Medicare Other | Admitting: Lab

## 2012-12-02 DIAGNOSIS — T451X5A Adverse effect of antineoplastic and immunosuppressive drugs, initial encounter: Secondary | ICD-10-CM | POA: Diagnosis not present

## 2012-12-02 DIAGNOSIS — K121 Other forms of stomatitis: Secondary | ICD-10-CM | POA: Diagnosis not present

## 2012-12-02 DIAGNOSIS — D0590 Unspecified type of carcinoma in situ of unspecified breast: Secondary | ICD-10-CM | POA: Diagnosis not present

## 2012-12-02 DIAGNOSIS — D61810 Antineoplastic chemotherapy induced pancytopenia: Secondary | ICD-10-CM

## 2012-12-02 DIAGNOSIS — C50919 Malignant neoplasm of unspecified site of unspecified female breast: Secondary | ICD-10-CM

## 2012-12-02 DIAGNOSIS — C50119 Malignant neoplasm of central portion of unspecified female breast: Secondary | ICD-10-CM

## 2012-12-02 DIAGNOSIS — K1230 Oral mucositis (ulcerative), unspecified: Secondary | ICD-10-CM | POA: Diagnosis not present

## 2012-12-02 DIAGNOSIS — C50911 Malignant neoplasm of unspecified site of right female breast: Secondary | ICD-10-CM

## 2012-12-02 LAB — CBC WITH DIFFERENTIAL/PLATELET
HCT: 33.8 % — ABNORMAL LOW (ref 34.8–46.6)
HGB: 11.4 g/dL — ABNORMAL LOW (ref 11.6–15.9)
MCH: 28.8 pg (ref 25.1–34.0)
MCHC: 33.7 g/dL (ref 31.5–36.0)
Platelets: ADEQUATE 10*3/uL (ref 145–400)

## 2012-12-02 LAB — MANUAL DIFFERENTIAL
ALC: 0.3 10*3/uL — ABNORMAL LOW (ref 0.9–3.3)
ANC (CHCC manual diff): 0 10*3/uL — CL (ref 1.5–6.5)
Band Neutrophils: 0 % (ref 0–10)
Blasts: 0 % (ref 0–0)
LYMPH: 76 % — ABNORMAL HIGH (ref 14–49)
MONO: 20 % — ABNORMAL HIGH (ref 0–14)
Metamyelocytes: 0 % (ref 0–0)
Other Cell: 0 % (ref 0–0)
Variant Lymph: 0 % (ref 0–0)
nRBC: 0 % (ref 0–0)

## 2012-12-02 MED ORDER — CIPROFLOXACIN HCL 500 MG PO TABS: 500.0000 mg | ORAL_TABLET | Freq: Two times a day (BID) | ORAL | Status: DC

## 2012-12-02 MED ORDER — FLUCONAZOLE 100 MG PO TABS: 100.0000 mg | ORAL_TABLET | Freq: Every day | ORAL | Status: DC

## 2012-12-02 MED ORDER — ACYCLOVIR 400 MG PO TABS: 400.0000 mg | ORAL_TABLET | Freq: Two times a day (BID) | ORAL | Status: DC

## 2012-12-02 MED ORDER — MAGIC MOUTHWASH W/LIDOCAINE: 5.0000 mL | Freq: Three times a day (TID) | ORAL | Status: DC | PRN

## 2012-12-02 NOTE — Progress Notes (Signed)
[facility]  Telephone:(336) [phone] Fax:(336) [phone]  OFFICE PROGRESS NOTE    ID: IRELYNN SCHERMERHORN   DOB: [birth year]  MR#: [id number]  JYN#:[id number]   PCP: [name], MD GYN: [name], [name], [name],    HISTORY OF PRESENT ILLNESS: Tocarra had screening mammography at [facility] 04/10/2012 raising the question of some axillary lymph nodes on the right. Additional views of the right axilla and right axillary ultrasound performed 04/04/2012 showed 3 lymph nodes that appeared larger than prior. The largest measured 1.5 cm. 2 of them had cortical thickening. Close follow up was suggested, and repeat right axillary ultrasound 06/25/2012 showed no significant change in the lymph nodes in question. Follow up ultrasound in 3 months was recommended, but in the interim the patient saw her primary physician, Dr. [name] and ADL, and he set her up for a chest CT on 07/08/2012, which showed a mild asymmetric density in the upper mid right breast. There was a prominent right axillary lymph node measuring 1.9 cm, with a few smaller adjacent nodes asymmetric with compared to the left side. Again, short interval follow up was recommended, and on 10/07/2012 the patient had digital right mammography and ultrasonography, now at the breast Center. Dr. [name] noted pleomorphic calcifications spanning an area of 6.3 cm without associated mass. Physical exam was unremarkable. Ultrasound showed an area of adenopathy in the right axilla measuring 1.6 cm, with a second lymph node with a thickened cortex measuring 1.2 cm. No suspicious mass was seen by ultrasound in the right breast.  Biopsy of the larger axillary lymph node on 10/07/2012 showed ([id number]) an invasive ductal carcinoma, triple negative, with an MIB-1 of 66%. Biopsy of the right breast the next day, [id number]) showed invasive ductal carcinoma, grade 3. Breast MRI 10/14/2012  showed a total area of irregular enhancement in the right breast measuring up to 12 cm. MRI guided biopsy of an area in the upper inner quadrant of the right breast on 10/23/2012 showed ([id number]) ductal carcinoma in situ, with foci worrisome for invasion.  The patient's subsequent history is as detailed below  INTERVAL HISTORY: Meda returns today for follow up of her locally advanced right breast cancer. Today is an interim visit after chemotherapy cycle 2.  She is scheduled for  day 1 cycle 3 of her 4 planned "AC" chemotherapy treatments on 12/09/2012.  Adriamycin/Cytoxan chemotherapy to be followed by weekly Paclitaxel/Carboplatin.  REVIEW OF SYSTEMS: She complains of minor bone pain after receiving Neulasta injections. She also has complaints of having a "sore mouth and lips."  She denies any other symptomatology including problems with nausea or vomiting. Otherwise a detailed review of systems today was noncontributory.  PAST MEDICAL HISTORY: Past Medical History  Diagnosis Date  . Hypertension   . Cerebrovascular disease, unspecified   . Cerebral aneurysm, nonruptured   . Pure hypercholesterolemia   . Hypothyroidism   . Colonic polyp   . Breast cancer 09/2012    right breast/right axillary lymph node t2,pn1, stage 11b, invasive ductal carcinma, grade 3, triple negative, with an mib-1 of 15%  . History of chemotherapy     PAST SURGICAL HISTORY: Past Surgical History  Procedure Laterality Date  . Left middle cerebral artery angioplasty  2004    by [name] had 2 follow up occurances  . Portacath placement N/A 11/05/2012    Procedure: INSERTION PORT-A-CATH;  Surgeon: [name], MD;  Location: [facility];  Service: General;  Laterality: N/A;  . Breast biopsy Right 10/23/2012  . Breast biopsy Right 10/08/2012    FAMILY HISTORY Family History  Problem Relation Age of Onset  . Lung cancer Mother   . Heart disease Father   . Colon cancer Brother   .  Alcohol abuse Brother   . Colon cancer Maternal Uncle   [name] died at the age of 57. The patient's mother died at the age of 57 from lung cancer. She was not a smoker. The cancer was diagnosed when she was 72 years old. The patient's mother had 2 sisters and one brother. One of those sisters had lung cancer and a brother had colon cancer, both diagnosed in their 36s. The patient herself has one brother who died from colon cancer at the age of 73. That brother has a daughter who was diagnosed with uterine cancer at the age of 39. All this raises the question of possible Lynch syndrome and I have referred the patient for genetics counseling.  GYNECOLOGIC HISTORY: Menarche age 28, menopause around 64. The patient is GX P0. She took birth control pills for many years with no complications.  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  Her stepchildren are [name] who lives in [place]" and is financial vice president of [employer], and [place] who lives in [place] and works as a Transport planner. The patient has 5 grandchildren. She is currently not a church attender.  ADVANCED DIRECTIVES: Not in place; this was discussed 10/29/2012 and the patient was advised to complete a healthcare power of attorney document.  HEALTH MAINTENANCE: History  Substance Use Topics  . Smoking status: Former Smoker -- 1.00 packs/day for 20 years    Types: Cigarettes    Quit date: 04/02/1986  . Smokeless tobacco: Never Used  . Alcohol Use: No     Colonoscopy: 2012  PAP: 2012  Bone density: Not on file  Lipid panel: 06/04/2012  No Known Allergies  Current Outpatient Prescriptions  Medication Sig Dispense Refill  . aspirin 325 MG tablet Take 325 mg by mouth daily.        . Calcium Carbonate-Vitamin D (CALCIUM 600+D) 600-200 MG-UNIT TABS Take 1 tablet by mouth 2 (two) times daily.        . clopidogrel (PLAVIX) 75 MG  tablet TAKE ONE TABLET BY MOUTH EVERY DAY  30 tablet  6  . dexamethasone (DECADRON) 4 MG tablet Take 2 tablets by mouth once a day on the evening of chemotherapy and then take 2 tablets two times a day for 2 days. Take with food.  30 tablet  1  . levothyroxine (SYNTHROID, LEVOTHROID) 125 MCG tablet Take 1 tablet (125 mcg total) by mouth daily.  30 tablet  11  . lidocaine-prilocaine (EMLA) cream Apply topically as needed. Apply over port 1-2 hors before chemo and cover with plastic wrap  30 g  0  . loratadine (CLARITIN) 10 MG tablet Take 10 mg by mouth daily.      Marland Kitchen LORazepam (ATIVAN) 0.5 MG tablet Take 1 tablet (0.5 mg total) by mouth at bedtime as needed (Nausea or vomiting).  30 tablet  0  . metoprolol succinate (TOPROL-XL) 100 MG 24 hr tablet TAKE ONE TABLET BY MOUTH EVERY DAY  30 tablet  11  . Multiple Vitamin (MULTIVITAMIN) capsule Take 1 capsule by mouth daily.        . Naproxen Sodium (ALEVE)  220 MG CAPS Take 1 capsule by mouth as needed.      Marland Kitchen oxyCODONE-acetaminophen (ROXICET) 5-325 MG per tablet Take 1 tablet by mouth every 6 (six) hours as needed for pain.  20 tablet  0  . prochlorperazine (COMPAZINE) 10 MG tablet Take 1 tablet (10 mg total) by mouth every 6 (six) hours as needed (Nausea or vomiting).  30 tablet  1  . rosuvastatin (CRESTOR) 20 MG tablet Take 1 tablet (20 mg total) by mouth daily.  30 tablet  11  . venlafaxine XR (EFFEXOR-XR) 75 MG 24 hr capsule Take 1 capsule (75 mg total) by mouth daily.  30 capsule  6  . acyclovir (ZOVIRAX) 400 MG tablet Take 1 tablet (400 mg total) by mouth 2 (two) times daily.  60 tablet  2  . Alum & Mag Hydroxide-Simeth (MAGIC MOUTHWASH W/LIDOCAINE) SOLN Take 5 mLs by mouth 3 (three) times daily as needed.  100 mL  1  . ciprofloxacin (CIPRO) 500 MG tablet Take 1 tablet (500 mg total) by mouth 2 (two) times daily.  14 tablet  3  . fluconazole (DIFLUCAN) 100 MG tablet Take 1 tablet (100 mg total) by mouth daily.  7 tablet  3  .  lisinopril-hydrochlorothiazide (PRINZIDE) 20-12.5 MG per tablet Take 1 tablet by mouth daily.  30 tablet  11   No current facility-administered medications for this visit.    OBJECTIVE: Middle-aged white woman in no acute distress There were no vitals filed for this visit.   There is no weight on file to calculate BMI.    ECOG FS: 1 There were no vitals filed for this visit.  General appearance: Alert, cooperative, well nourished, no apparent distress Head: Normocephalic, without obvious abnormality, atraumatic, the patient is wearing a wig, mucositis Eyes: Arcus senilis, PERRLA, EOMI Nose: Nares, septum and mucosa are normal, no drainage or sinus tenderness. Neck: No adenopathy, supple, symmetrical, trachea midline, thyroid not enlarged, no tenderness Resp: Clear to auscultation bilaterally Cardio: Regular rate and rhythm, S1, S2 normal, no murmur, click, rub or gallop, left chest Port-A-Cath showed did not have any signs of infection Breasts:  Deferred GI: Soft, distended, non-tender, hypoactive bowel sounds, no organomegaly Extremities: Extremities normal, atraumatic, no cyanosis or edema Lymph nodes: Cervical, supraclavicular, and axillary nodes normal Neurologic: Grossly normal   LAB RESULTS: Lab Results  Component Value Date   WBC 0.4* 12/02/2012   NEUTROABS 7.6* 11/25/2012   HGB 11.4* 12/02/2012   HCT 33.8* 12/02/2012   MCV 85.4 12/02/2012   PLT Clumped Platelets--Appears Adequate 12/02/2012      Chemistry      Component Value Date/Time   NA 135* 11/18/2012 0924   NA 138 06/04/2012 1124   K 4.3 11/18/2012 0924   K 4.3 06/04/2012 1124   CL 101 11/18/2012 0924   CL 103 06/04/2012 1124   CO2 25 11/18/2012 0924   CO2 29 06/04/2012 1124   BUN 13.6 11/18/2012 0924   BUN 14 06/04/2012 1124   CREATININE 0.8 11/18/2012 0924   CREATININE 0.7 06/04/2012 1124      Component Value Date/Time   CALCIUM 9.5 11/18/2012 0924   CALCIUM 9.9 06/04/2012 1124   ALKPHOS 64 11/18/2012 0924   ALKPHOS 47  06/04/2012 1124   AST 11 11/18/2012 0924   AST 27 06/04/2012 1124   ALT 12 11/18/2012 0924   ALT 32 06/04/2012 1124   BILITOT 0.82 11/18/2012 0924   BILITOT 1.1 06/04/2012 1124       Lab Results  Component Value Date   LABCA2 6 10/29/2012    STUDIES: No results found.     ASSESSMENT: 72 y.o. [place] woman   (1)  Status post right breast and right axillary lymph node biopsies February 2014 of a clinical T2, pN1, stage IIB,  invasive ductal carcinoma, grade 3, triple negative, with an MIB-1 of 15%.  (2)  A third biopsy from a different quadrant 10/23/2012 showed ductal carcinoma in situ  (3)  Currently being treated in the neoadjuvant setting, the goal being to complete 4 dose dense cycles of doxorubicin/cyclophosphamide followed by 12 weekly doses of paclitaxel and carboplatin prior to definitive surgery. First dose of chemotherapy was given on 11/11/2012.  (4) Family history suggestive of Lynch syndrome; genetics evaluation in process.  (5)  Chemotherapy induced pancytopenia.  (6) Mucositis  PLAN:  Andre will be treated with Cipro 500 mg by mouth twice a day for pancytopenia prior to her next chemotherapy cycle.  An electronic prescription for Cipro 500 mg by mouth twice a day #14 with 3 refills was sent to the patient's pharmacy.    For mucositis , an electronic prescription for Diflucan 100 mg by mouth daily #7 with 3 refills was sent to the patient's pharmacy.  An electronic prescription for Acyclovir 400 mg twice a day #60 with 2 refills was sent to the patient's pharmacy.  The patient will remain on Acyclovir for the duration of chemotherapy treatment.  An electronic prescription for Magic mouthwash was also sent to the patient's pharmacy.    She is tolerating treatment well, and no changes will be made to her supportive medications.  We plan to see the patient again on 12/09/2012 for day 1 of cycle 3 of Adriamycin/Cytoxan.  The patient is scheduled to have a breast MRI on  12/30/2012 after the completion of her first 4 cycles of chemotherapy.  The patient is encouraged to contact us in the interim with any questions or concerns.  Larina Bras NP-C 12/02/2012 6:32 PM

## 2012-12-03 ENCOUNTER — Other Ambulatory Visit: Payer: Self-pay | Admitting: *Deleted

## 2012-12-04 ENCOUNTER — Other Ambulatory Visit: Payer: Self-pay | Admitting: Pulmonary Disease

## 2012-12-04 DIAGNOSIS — E7800 Pure hypercholesterolemia, unspecified: Secondary | ICD-10-CM

## 2012-12-06 ENCOUNTER — Other Ambulatory Visit: Payer: Self-pay | Admitting: *Deleted

## 2012-12-06 DIAGNOSIS — C50919 Malignant neoplasm of unspecified site of unspecified female breast: Secondary | ICD-10-CM

## 2012-12-09 ENCOUNTER — Other Ambulatory Visit (HOSPITAL_BASED_OUTPATIENT_CLINIC_OR_DEPARTMENT_OTHER): Payer: Medicare Other | Admitting: Lab

## 2012-12-09 ENCOUNTER — Ambulatory Visit (HOSPITAL_BASED_OUTPATIENT_CLINIC_OR_DEPARTMENT_OTHER): Payer: Medicare Other

## 2012-12-09 ENCOUNTER — Ambulatory Visit (HOSPITAL_BASED_OUTPATIENT_CLINIC_OR_DEPARTMENT_OTHER): Payer: Medicare Other | Admitting: Oncology

## 2012-12-09 VITALS — BP 135/74 | HR 98 | Temp 98.5°F | Resp 20 | Ht 69.0 in | Wt 164.1 lb

## 2012-12-09 DIAGNOSIS — D0590 Unspecified type of carcinoma in situ of unspecified breast: Secondary | ICD-10-CM | POA: Diagnosis not present

## 2012-12-09 DIAGNOSIS — C773 Secondary and unspecified malignant neoplasm of axilla and upper limb lymph nodes: Secondary | ICD-10-CM

## 2012-12-09 DIAGNOSIS — R252 Cramp and spasm: Secondary | ICD-10-CM | POA: Diagnosis not present

## 2012-12-09 DIAGNOSIS — C50111 Malignant neoplasm of central portion of right female breast: Secondary | ICD-10-CM

## 2012-12-09 DIAGNOSIS — Z5111 Encounter for antineoplastic chemotherapy: Secondary | ICD-10-CM | POA: Diagnosis not present

## 2012-12-09 DIAGNOSIS — R599 Enlarged lymph nodes, unspecified: Secondary | ICD-10-CM

## 2012-12-09 DIAGNOSIS — C50119 Malignant neoplasm of central portion of unspecified female breast: Secondary | ICD-10-CM

## 2012-12-09 DIAGNOSIS — C50911 Malignant neoplasm of unspecified site of right female breast: Secondary | ICD-10-CM

## 2012-12-09 LAB — CBC WITH DIFFERENTIAL/PLATELET
Basophils Absolute: 0.1 10*3/uL (ref 0.0–0.1)
EOS%: 0 % (ref 0.0–7.0)
Eosinophils Absolute: 0 10*3/uL (ref 0.0–0.5)
HCT: 35.3 % (ref 34.8–46.6)
HGB: 12 g/dL (ref 11.6–15.9)
LYMPH%: 8.5 % — ABNORMAL LOW (ref 14.0–49.7)
MCH: 28.9 pg (ref 25.1–34.0)
MCV: 85.1 fL (ref 79.5–101.0)
MONO%: 8.6 % (ref 0.0–14.0)
NEUT#: 9.2 10*3/uL — ABNORMAL HIGH (ref 1.5–6.5)
NEUT%: 81.6 % — ABNORMAL HIGH (ref 38.4–76.8)
Platelets: 183 10*3/uL (ref 145–400)
RDW: 13.8 % (ref 11.2–14.5)

## 2012-12-09 LAB — COMPREHENSIVE METABOLIC PANEL (CC13)
AST: 16 U/L (ref 5–34)
Albumin: 3.5 g/dL (ref 3.5–5.0)
Alkaline Phosphatase: 85 U/L (ref 40–150)
BUN: 15 mg/dL (ref 7.0–26.0)
Creatinine: 0.8 mg/dL (ref 0.6–1.1)
Glucose: 93 mg/dl (ref 70–99)
Total Bilirubin: 0.46 mg/dL (ref 0.20–1.20)

## 2012-12-09 MED ORDER — PALONOSETRON HCL INJECTION 0.25 MG/5ML
0.2500 mg | Freq: Once | INTRAVENOUS | Status: AC
  Administered 2012-12-09: 0.25 mg via INTRAVENOUS

## 2012-12-09 MED ORDER — DOXORUBICIN HCL CHEMO IV INJECTION 2 MG/ML
60.0000 mg/m2 | Freq: Once | INTRAVENOUS | Status: AC
  Administered 2012-12-09: 116 mg via INTRAVENOUS
  Filled 2012-12-09: qty 58

## 2012-12-09 MED ORDER — HEPARIN SOD (PORK) LOCK FLUSH 100 UNIT/ML IV SOLN
500.0000 [IU] | Freq: Once | INTRAVENOUS | Status: AC | PRN
  Administered 2012-12-09: 500 [IU]
  Filled 2012-12-09: qty 5

## 2012-12-09 MED ORDER — SODIUM CHLORIDE 0.9 % IJ SOLN
10.0000 mL | INTRAMUSCULAR | Status: DC | PRN
  Administered 2012-12-09: 10 mL
  Filled 2012-12-09: qty 10

## 2012-12-09 MED ORDER — SODIUM CHLORIDE 0.9 % IV SOLN
Freq: Once | INTRAVENOUS | Status: AC
  Administered 2012-12-09: 14:00:00 via INTRAVENOUS

## 2012-12-09 MED ORDER — SODIUM CHLORIDE 0.9 % IV SOLN
600.0000 mg/m2 | Freq: Once | INTRAVENOUS | Status: AC
  Administered 2012-12-09: 1160 mg via INTRAVENOUS
  Filled 2012-12-09: qty 58

## 2012-12-09 MED ORDER — DEXAMETHASONE SODIUM PHOSPHATE 4 MG/ML IJ SOLN
12.0000 mg | Freq: Once | INTRAMUSCULAR | Status: AC
  Administered 2012-12-09: 12 mg via INTRAVENOUS

## 2012-12-09 MED ORDER — SODIUM CHLORIDE 0.9 % IV SOLN
150.0000 mg | Freq: Once | INTRAVENOUS | Status: AC
  Administered 2012-12-09: 150 mg via INTRAVENOUS
  Filled 2012-12-09: qty 5

## 2012-12-09 NOTE — Progress Notes (Signed)
ID: Debra Mcintyre   DOB: 06/06/1941  MR#: [id number]  HQI#:[id number]  PCP: Debra Mcalpine, MD GYN: Debra Mcintyre SU: Debra Mcintyre OTHER MD: Debra Mcintyre, Debra Mcintyre, Debra Mcintyre,   HISTORY OF PRESENT ILLNESS: Debra Mcintyre had screening mammography at [facility] 04/10/2012 raising the question of some axillary lymph nodes on the right. Additional views of the right axilla and right axillary ultrasound performed 04/04/2012 showed 3 lymph nodes that appeared larger than prior. The largest measured 1.5 cm. 2 of them had cortical thickening. Close followup was suggested, and repeat right axillary ultrasound 06/25/2012 showed no significant change in the lymph nodes in question. Followup ultrasound in 3 months was recommended, but in the interim the patient saw her primary physician, Debra Mcintyre and ADL, and he set her up for a chest CT on 07/08/2012, which showed a mild asymmetric density in the upper mid right breast. There was a prominent right axillary lymph node measuring 1.9 cm, with a few smaller adjacent nodes asymmetric with compared to the left side. Again, short interval followup was recommended, and on 10/07/2012 the patient had digital right mammography and ultrasonography, now at the breast Center. Debra Mcintyre noted pleomorphic calcifications spanning an area of 6.3 cm without associated mass. Physical exam was unremarkable. Ultrasound showed an area of adenopathy in the right axilla measuring 1.6 cm, with a second lymph node with a thickened cortex measuring 1.2 cm. No suspicious mass was seen by ultrasound in the right breast.  Biopsy of the larger axillary lymph node on 10/07/2012 showed ([id number]) an invasive ductal carcinoma, triple negative, with an MIB-1 of 66%. Biopsy of the right breast the next day, [id number]) showed invasive ductal carcinoma, grade 3. Breast MRI 10/14/2012 showed a total area of irregular enhancement in the right breast measuring up to 12 cm. MRI guided biopsy of an  area in the upper inner quadrant of the right breast on 10/23/2012 showed ([id number]) ductal carcinoma in situ, with foci worrisome for invasion.  The patient's subsequent history is as detailed below  INTERVAL HISTORY: Debra Mcintyre returns today with her husband Debra Mcintyre for followup of her locally advanced right breast cancer. Today is day 1 cycle 3 of her 4 planned "AC" chemotherapy treatments, to be followed by weekly paclitaxel/ carboplatin.  REVIEW OF SYSTEMS: She is doing very well with her chemotherapy. The chief problems she is having is cramps at night. She notices these more when she gets into bed. The mouth sores have not recurred. She is taking acyclovir for that. She can be short of breath when walking up stairs or when walking long distances. This is not a very marked problem and basically she does all her usual activities of daily living. She did not have as many bony pains with the Neulasta the second time as she did the first otherwise a detailed review of systems today was noncontributory  PAST MEDICAL HISTORY: Past Medical History  Diagnosis Date  . Hypertension   . Cerebrovascular disease, unspecified   . Cerebral aneurysm, nonruptured   . Pure hypercholesterolemia   . Hypothyroidism   . Colonic polyp   . Breast cancer 09/2012    right breast/right axillary lymph node t2,pn1, stage 11b, invasive ductal carcinma, grade 3, triple negative, with an mib-1 of 15%  . History of chemotherapy     PAST SURGICAL HISTORY: Past Surgical History  Procedure Laterality Date  . Left middle cerebral artery angioplasty  2004    by Debra Mcintyre had 2 follow up occurances  .  Portacath placement N/A 11/05/2012    Procedure: INSERTION PORT-A-CATH;  Surgeon: Debra Loron, MD;  Location: [facility];  Service: General;  Laterality: N/A;  . Breast biopsy Right 10/23/2012  . Breast biopsy Right 10/08/2012    FAMILY HISTORY Family History  Problem Relation Age of Onset  . Lung  cancer Mother   . Heart disease Father   . Colon cancer Brother   . Alcohol abuse Brother   . Colon cancer Maternal Uncle    the patient's father died at the age of 23. The patient's mother died at the age of 67 from lung cancer. She was not a smoker. The cancer was diagnosed when she was 72 years old. The patient's mother had 2 sisters and one brother. One of those sisters had lung cancer and a brother had colon cancer, both diagnosed in their 42s. The patient herself has one brother who died from colon cancer at the age of 40. That brother has a daughter who was diagnosed with uterine cancer at the age of 40. All this raises the question of possible Lynch syndrome and I have referred the patient for genetics counseling.  GYNECOLOGIC HISTORY: Menarche age 62, menopause around 44. The patient is GX P0. She took birth control pills for many years with no complications.  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 Debra Mcintyre who lives in "[place]" and is financial vice president of [employer], and Debra Mcintyre who lives in [place] and works as a Transport planner. The patient has 5 grandchildren. She is currently not a church attender   ADVANCED DIRECTIVES: Not in place; this was discussed 10/29/2012 and the patient was advised to complete a healthcare power of attorney document  HEALTH MAINTENANCE: History  Substance Use Topics  . Smoking status: Former Smoker -- 1.00 packs/day for 20 years    Types: Cigarettes    Quit date: 04/02/1986  . Smokeless tobacco: Never Used  . Alcohol Use: No     Colonoscopy: 2012  PAP: 2012  Bone density:  Lipid panel:  No Known Allergies  Current Outpatient Prescriptions  Medication Sig Dispense Refill  . acyclovir (ZOVIRAX) 400 MG tablet Take 1 tablet (400 mg total) by mouth 2 (two) times daily.  60 tablet  2  . Alum & Mag Hydroxide-Simeth  (MAGIC MOUTHWASH W/LIDOCAINE) SOLN Take 5 mLs by mouth 3 (three) times daily as needed.  100 mL  1  . aspirin 325 MG tablet Take 325 mg by mouth daily.        . Calcium Carbonate-Vitamin D (CALCIUM 600+D) 600-200 MG-UNIT TABS Take 1 tablet by mouth 2 (two) times daily.        . ciprofloxacin (CIPRO) 500 MG tablet Take 1 tablet (500 mg total) by mouth 2 (two) times daily.  14 tablet  3  . clopidogrel (PLAVIX) 75 MG tablet TAKE ONE TABLET BY MOUTH EVERY DAY  30 tablet  6  . dexamethasone (DECADRON) 4 MG tablet Take 2 tablets by mouth once a day on the evening of chemotherapy and then take 2 tablets two times a day for 2 days. Take with food.  30 tablet  1  . fluconazole (DIFLUCAN) 100 MG tablet Take 1 tablet (100 mg total) by mouth daily.  7 tablet  3  . levothyroxine (SYNTHROID, LEVOTHROID) 125 MCG tablet Take 1 tablet (125 mcg total) by mouth daily.  30  tablet  11  . lidocaine-prilocaine (EMLA) cream Apply topically as needed. Apply over port 1-2 hors before chemo and cover with plastic wrap  30 g  0  . lisinopril-hydrochlorothiazide (PRINZIDE) 20-12.5 MG per tablet Take 1 tablet by mouth daily.  30 tablet  11  . loratadine (CLARITIN) 10 MG tablet Take 10 mg by mouth daily.      Marland Kitchen LORazepam (ATIVAN) 0.5 MG tablet Take 1 tablet (0.5 mg total) by mouth at bedtime as needed (Nausea or vomiting).  30 tablet  0  . metoprolol succinate (TOPROL-XL) 100 MG 24 hr tablet TAKE ONE TABLET BY MOUTH EVERY DAY  30 tablet  11  . Multiple Vitamin (MULTIVITAMIN) capsule Take 1 capsule by mouth daily.        . Naproxen Sodium (ALEVE) 220 MG CAPS Take 1 capsule by mouth as needed.      Marland Kitchen oxyCODONE-acetaminophen (ROXICET) 5-325 MG per tablet Take 1 tablet by mouth every 6 (six) hours as needed for pain.  20 tablet  0  . prochlorperazine (COMPAZINE) 10 MG tablet Take 1 tablet (10 mg total) by mouth every 6 (six) hours as needed (Nausea or vomiting).  30 tablet  1  . rosuvastatin (CRESTOR) 20 MG tablet Take 1 tablet (20  mg total) by mouth daily.  30 tablet  11  . venlafaxine XR (EFFEXOR-XR) 75 MG 24 hr capsule Take 1 capsule (75 mg total) by mouth daily.  30 capsule  6   No current facility-administered medications for this visit.    OBJECTIVE: Middle-aged white woman in no acute distress Filed Vitals:   12/09/12 1303  BP: 135/74  Pulse: 98  Temp: 98.5 F (36.9 C)  Resp: 20     Body mass index is 24.22 kg/(m^2).    ECOG FS: 1 Filed Weights   12/09/12 1303  Weight: 164 lb 1.6 oz (74.435 kg)    Sclerae unicteric Oropharynx clear, No cervical or supraclavicular adenopathy; Lungs clear to auscultation bilaterally with no rales or rhonchi Heart regular rate and rhythm Abdomen soft, nontender, positive bowel sounds MSK scoliosis but no focal spinal tenderness to palpation, no peripheral edema Neuro: nonfocal, well oriented, pleasant affect Breasts: I do not palpate any masses in the right breast or right axilla; the left breast is unremarkable   LAB RESULTS: Lab Results  Component Value Date   WBC 11.3* 12/09/2012   NEUTROABS 9.2* 12/09/2012   HGB 12.0 12/09/2012   HCT 35.3 12/09/2012   MCV 85.1 12/09/2012   PLT 183 12/09/2012      Chemistry      Component Value Date/Time   NA 135* 11/18/2012 0924   NA 138 06/04/2012 1124   K 4.3 11/18/2012 0924   K 4.3 06/04/2012 1124   CL 101 11/18/2012 0924   CL 103 06/04/2012 1124   CO2 25 11/18/2012 0924   CO2 29 06/04/2012 1124   BUN 13.6 11/18/2012 0924   BUN 14 06/04/2012 1124   CREATININE 0.8 11/18/2012 0924   CREATININE 0.7 06/04/2012 1124      Component Value Date/Time   CALCIUM 9.5 11/18/2012 0924   CALCIUM 9.9 06/04/2012 1124   ALKPHOS 64 11/18/2012 0924   ALKPHOS 47 06/04/2012 1124   AST 11 11/18/2012 0924   AST 27 06/04/2012 1124   ALT 12 11/18/2012 0924   ALT 32 06/04/2012 1124   BILITOT 0.82 11/18/2012 0924   BILITOT 1.1 06/04/2012 1124       Lab Results  Component Value Date  LABCA2 6 10/29/2012    STUDIES: No results  found.  ASSESSMENT: 72 y.o. [place] woman   (1)  status post right breast and right axillary lymph node biopsies February 2014 of a clinical T2, pN1, stage IIB,  invasive ductal carcinoma, grade 3, triple negative, with an MIB-1 of 15%  (2)  a third biopsy from a different quadrant 10/23/2012 showed ductal carcinoma in situ  (3)  currently being treated in the neoadjuvant setting, the goal being to complete 4 dose dense cycles of doxorubicin/cyclophosphamide followed by 12 weekly doses of paclitaxel and carboplatin prior to definitive surgery. First dose of chemotherapy was given on 11/11/2012.  (4) family history suggestive of Lynch syndrome; genetics evaluation in process  PLAN:  Rashauna is doing very well with her chemotherapy. Her nadir counts have been consistently above 500. I think the question of cramps may be due to if not dehydration (because she says she is drinking a lot of fluids daily) perhaps low potassium. I suggested she eat some fruit every day. We also talked about stretching exercises. The blurred vision she is minimally experiencing is going to be due to accommodation problems. She is going to check back on her prescription about 6 months after completing chemotherapy.  Otherwise she is doing very well, and proceeding to her third of 4 cycles of every 2 weeks chemotherapy today. She will see [place] again next week and then have her fourth cycle 2 weeks from now. After that she will have a restaging MRI. She will see me to discuss those results and to set her up for the following 12 weeks of Taxol/carboplatin.  MAGRINAT,GUSTAV C    12/09/2012

## 2012-12-09 NOTE — Progress Notes (Signed)
Adriamycin in 2 syringes. Checked for blood return every 5 cc's.

## 2012-12-09 NOTE — Patient Instructions (Addendum)
[facility] Discharge Instructions for Patients Receiving Chemotherapy  Today you received the following chemotherapy agents adriamycin,cytoxan  To help prevent nausea and vomiting after your treatment, we encourage you to take your nausea medications as prescribed. .   If you develop nausea and vomiting that is not controlled by your nausea medication, call the clinic. If it is after clinic hours your family physician or the after hours number for the clinic or go to the Emergency Department.   BELOW ARE SYMPTOMS THAT SHOULD BE REPORTED IMMEDIATELY:  *FEVER GREATER THAN 100.5 F  *CHILLS WITH OR WITHOUT FEVER  NAUSEA AND VOMITING THAT IS NOT CONTROLLED WITH YOUR NAUSEA MEDICATION  *UNUSUAL SHORTNESS OF BREATH  *UNUSUAL BRUISING OR BLEEDING  TENDERNESS IN MOUTH AND THROAT WITH OR WITHOUT PRESENCE OF ULCERS  *URINARY PROBLEMS  *BOWEL PROBLEMS  UNUSUAL RASH Items with * indicate a potential emergency and should be followed up as soon as possible.  . Please let the nurse know about any problems that you may have experienced. Feel free to call the clinic you have any questions or concerns. The clinic phone number is [phone].   I have been informed and understand all the instructions given to me. I know to contact the clinic, my physician, or go to the Emergency Department if any problems should occur. I do not have any questions at this time, but understand that I may call the clinic during office hours   should I have any questions or need assistance in obtaining follow up care.    __________________________________________  _____________  __________ Signature of Patient or Authorized Representative            Date                   Time    __________________________________________ Nurse's Signature

## 2012-12-10 ENCOUNTER — Other Ambulatory Visit (INDEPENDENT_AMBULATORY_CARE_PROVIDER_SITE_OTHER): Payer: Medicare Other

## 2012-12-10 ENCOUNTER — Ambulatory Visit (HOSPITAL_BASED_OUTPATIENT_CLINIC_OR_DEPARTMENT_OTHER): Payer: Medicare Other

## 2012-12-10 ENCOUNTER — Encounter: Payer: Self-pay | Admitting: *Deleted

## 2012-12-10 VITALS — BP 151/81 | HR 94 | Temp 98.5°F

## 2012-12-10 DIAGNOSIS — Z5189 Encounter for other specified aftercare: Secondary | ICD-10-CM

## 2012-12-10 DIAGNOSIS — R599 Enlarged lymph nodes, unspecified: Secondary | ICD-10-CM

## 2012-12-10 DIAGNOSIS — C50119 Malignant neoplasm of central portion of unspecified female breast: Secondary | ICD-10-CM

## 2012-12-10 DIAGNOSIS — C50911 Malignant neoplasm of unspecified site of right female breast: Secondary | ICD-10-CM

## 2012-12-10 DIAGNOSIS — E7800 Pure hypercholesterolemia, unspecified: Secondary | ICD-10-CM | POA: Diagnosis not present

## 2012-12-10 LAB — LIPID PANEL
Cholesterol: 167 mg/dL (ref 0–200)
Triglycerides: 141 mg/dL (ref 0.0–149.0)

## 2012-12-10 MED ORDER — PEGFILGRASTIM INJECTION 6 MG/0.6ML
6.0000 mg | Freq: Once | SUBCUTANEOUS | Status: AC
  Administered 2012-12-10: 6 mg via SUBCUTANEOUS
  Filled 2012-12-10: qty 0.6

## 2012-12-10 NOTE — Progress Notes (Signed)
Mailed after appt letter to pt. 

## 2012-12-11 ENCOUNTER — Encounter: Payer: Self-pay | Admitting: Pulmonary Disease

## 2012-12-13 ENCOUNTER — Other Ambulatory Visit: Payer: Self-pay | Admitting: *Deleted

## 2012-12-13 DIAGNOSIS — C50119 Malignant neoplasm of central portion of unspecified female breast: Secondary | ICD-10-CM

## 2012-12-16 ENCOUNTER — Ambulatory Visit (HOSPITAL_BASED_OUTPATIENT_CLINIC_OR_DEPARTMENT_OTHER): Payer: Medicare Other | Admitting: Physician Assistant

## 2012-12-16 ENCOUNTER — Encounter: Payer: Self-pay | Admitting: Physician Assistant

## 2012-12-16 ENCOUNTER — Other Ambulatory Visit (HOSPITAL_BASED_OUTPATIENT_CLINIC_OR_DEPARTMENT_OTHER): Payer: Medicare Other

## 2012-12-16 VITALS — BP 116/68 | HR 101 | Temp 99.0°F | Resp 20 | Ht 69.0 in | Wt 164.7 lb

## 2012-12-16 DIAGNOSIS — C50219 Malignant neoplasm of upper-inner quadrant of unspecified female breast: Secondary | ICD-10-CM

## 2012-12-16 DIAGNOSIS — C50911 Malignant neoplasm of unspecified site of right female breast: Secondary | ICD-10-CM

## 2012-12-16 DIAGNOSIS — C50119 Malignant neoplasm of central portion of unspecified female breast: Secondary | ICD-10-CM

## 2012-12-16 DIAGNOSIS — K219 Gastro-esophageal reflux disease without esophagitis: Secondary | ICD-10-CM | POA: Diagnosis not present

## 2012-12-16 DIAGNOSIS — C773 Secondary and unspecified malignant neoplasm of axilla and upper limb lymph nodes: Secondary | ICD-10-CM | POA: Diagnosis not present

## 2012-12-16 DIAGNOSIS — Z171 Estrogen receptor negative status [ER-]: Secondary | ICD-10-CM

## 2012-12-16 DIAGNOSIS — C50111 Malignant neoplasm of central portion of right female breast: Secondary | ICD-10-CM

## 2012-12-16 DIAGNOSIS — R599 Enlarged lymph nodes, unspecified: Secondary | ICD-10-CM

## 2012-12-16 LAB — CBC WITH DIFFERENTIAL/PLATELET
Basophils Absolute: 0 10*3/uL (ref 0.0–0.1)
EOS%: 0 % (ref 0.0–7.0)
Eosinophils Absolute: 0 10*3/uL (ref 0.0–0.5)
HCT: 30.9 % — ABNORMAL LOW (ref 34.8–46.6)
HGB: 10.7 g/dL — ABNORMAL LOW (ref 11.6–15.9)
MCH: 29.1 pg (ref 25.1–34.0)
MCV: 84 fL (ref 79.5–101.0)
NEUT#: 0.1 10*3/uL — CL (ref 1.5–6.5)
NEUT%: 21.6 % — ABNORMAL LOW (ref 38.4–76.8)
lymph#: 0.3 10*3/uL — ABNORMAL LOW (ref 0.9–3.3)

## 2012-12-16 MED ORDER — LORAZEPAM 0.5 MG PO TABS: 0.5000 mg | ORAL_TABLET | Freq: Every evening | ORAL | Status: DC | PRN

## 2012-12-16 MED ORDER — FAMOTIDINE 40 MG PO TABS: 40.0000 mg | ORAL_TABLET | Freq: Every day | ORAL | Status: DC

## 2012-12-16 NOTE — Progress Notes (Signed)
ID: Debra Mcintyre   DOB: [birth year]  MR#: [id number]  KVQ#:[id number]  PCP: [name], MD GYN: [name], [name], [name],   HISTORY OF PRESENT ILLNESS: Debra Mcintyre had screening mammography at [facility] 04/10/2012 raising the question of some axillary lymph nodes on the right. Additional views of the right axilla and right axillary ultrasound performed 04/04/2012 showed 3 lymph nodes that appeared larger than prior. The largest measured 1.5 cm. 2 of them had cortical thickening. Close followup was suggested, and repeat right axillary ultrasound 06/25/2012 showed no significant change in the lymph nodes in question. Followup ultrasound in 3 months was recommended, but in the interim the patient saw her primary physician, Dr. [name] and ADL, and he set her up for a chest CT on 07/08/2012, which showed a mild asymmetric density in the upper mid right breast. There was a prominent right axillary lymph node measuring 1.9 cm, with a few smaller adjacent nodes asymmetric with compared to the left side. Again, short interval followup was recommended, and on 10/07/2012 the patient had digital right mammography and ultrasonography, now at the breast Center. Dr. [name] noted pleomorphic calcifications spanning an area of 6.3 cm without associated mass. Physical exam was unremarkable. Ultrasound showed an area of adenopathy in the right axilla measuring 1.6 cm, with a second lymph node with a thickened cortex measuring 1.2 cm. No suspicious mass was seen by ultrasound in the right breast.  Biopsy of the larger axillary lymph node on 10/07/2012 showed ([id number]) an invasive ductal carcinoma, triple negative, with an MIB-1 of 66%. Biopsy of the right breast the next day, [id number]) showed invasive ductal carcinoma, grade 3. Breast MRI 10/14/2012 showed a total area of irregular enhancement in the right breast measuring up to 12 cm. MRI guided biopsy of an  area in the upper inner quadrant of the right breast on 10/23/2012 showed ([id number]) ductal carcinoma in situ, with foci worrisome for invasion.  The patient's subsequent history is as detailed below  INTERVAL HISTORY: Debra Mcintyre returns today  for followup of her locally advanced right breast cancer. Today is day 8 cycle 3 of her 4 planned dose dense cycles of doxorubicin/cyclophosphamide being given in the neoadjuvant setting.  She receives Neulast on day 2 of each cycle.  Debra Mcintyre tells me she is "fine" this week and has only a few complaints.  Her mouth is a little sensitive.  The soles of her feet feel sore, but she denies redness, cracking, peeling, swelling, or numbness and tingling.  Her wig sometimes causes a headache just above her right ear.  She sometimes has difficulty sleeping but takes her lorazepam appropriately and effectively.  She requests a refill today.  She has also started having increased heartburn and reflux.  Fortunately, she denies any nausea or emesis.  REVIEW OF SYSTEMS: Debra Mcintyre has had no fever of chills.  She denies and skin changes, bruising or abnormal bleeding.  She has had no change in bowel or bladder habits.  She denies any increased cough, shortness or breath or chest pain.  She has had no dizziness.    Debra Mcintyre a detailed review of systems today was noncontributory.    PAST MEDICAL HISTORY: Past Medical History  Diagnosis Date  . Hypertension   . Cerebrovascular disease, unspecified   . Cerebral aneurysm, nonruptured   . Pure hypercholesterolemia   . Hypothyroidism   . Colonic polyp   . Breast cancer 09/2012    right  breast/right axillary lymph node t2,pn1, stage 11b, invasive ductal carcinma, grade 3, triple negative, with an mib-1 of 15%  . History of chemotherapy     PAST SURGICAL HISTORY: Past Surgical History  Procedure Laterality Date  . Left middle cerebral artery angioplasty  2004    by [name] had 2 follow up occurances  . Portacath  placement N/A 11/05/2012    Procedure: INSERTION PORT-A-CATH;  Surgeon: [name], MD;  Location: [facility];  Service: General;  Laterality: N/A;  . Breast biopsy Right 10/23/2012  . Breast biopsy Right 10/08/2012    FAMILY HISTORY Family History  Problem Relation Age of Onset  . Lung cancer Mother   . Heart disease Father   . Colon cancer Brother   . Alcohol abuse Brother   . Colon cancer Maternal Uncle    the patient's father died at the age of 24. The patient's mother died at the age of 82 from lung cancer. She was not a smoker. The cancer was diagnosed when she was 72 years old. The patient's mother had 2 sisters and one brother. One of those sisters had lung cancer and a brother had colon cancer, both diagnosed in their 72s. The patient herself has one brother who died from colon cancer at the age of 58. That brother has a daughter who was diagnosed with uterine cancer at the age of 106. All this raises the question of possible Lynch syndrome and I have referred the patient for genetics counseling.  GYNECOLOGIC HISTORY: Menarche age 28, menopause around 69. The patient is GX P0. She took birth control pills for many years with no complications.  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 Debra Mcintyre who lives in [place]" and is financial vice president of [employer], and Debra Mcintyre who lives in [place] and works as a Transport planner. The patient has 5 grandchildren. She is currently not a church attender   ADVANCED DIRECTIVES: Not in place; this was discussed 10/29/2012 and the patient was advised to complete a healthcare power of attorney document  HEALTH MAINTENANCE: History  Substance Use Topics  . Smoking status: Former Smoker -- 1.00 packs/day for 20 years    Types: Cigarettes    Quit date: 04/02/1986  . Smokeless tobacco: Never Used  .  Alcohol Use: No     Colonoscopy: 2012  PAP: 2012  Bone density:  Lipid panel:  No Known Allergies  Current Outpatient Prescriptions  Medication Sig Dispense Refill  . acyclovir (ZOVIRAX) 400 MG tablet Take 1 tablet (400 mg total) by mouth 2 (two) times daily.  60 tablet  2  . Alum & Mag Hydroxide-Simeth (MAGIC MOUTHWASH W/LIDOCAINE) SOLN Take 5 mLs by mouth 3 (three) times daily as needed.  100 mL  1  . aspirin 325 MG tablet Take 325 mg by mouth daily.        . Calcium Carbonate-Vitamin D (CALCIUM 600+D) 600-200 MG-UNIT TABS Take 1 tablet by mouth 2 (two) times daily.        . ciprofloxacin (CIPRO) 500 MG tablet Take 1 tablet (500 mg total) by mouth 2 (two) times daily.  14 tablet  3  . clopidogrel (PLAVIX) 75 MG tablet TAKE ONE TABLET BY MOUTH EVERY DAY  30 tablet  6  . dexamethasone (DECADRON) 4 MG tablet Take 2 tablets by mouth once a day on the evening of chemotherapy and  then take 2 tablets two times a day for 2 days. Take with food.  30 tablet  1  . famotidine (PEPCID) 40 MG tablet Take 1 tablet (40 mg total) by mouth daily.  30 tablet  3  . fluconazole (DIFLUCAN) 100 MG tablet Take 1 tablet (100 mg total) by mouth daily.  7 tablet  3  . levothyroxine (SYNTHROID, LEVOTHROID) 125 MCG tablet Take 1 tablet (125 mcg total) by mouth daily.  30 tablet  11  . lidocaine-prilocaine (EMLA) cream Apply topically as needed. Apply over port 1-2 hors before chemo and cover with plastic wrap  30 g  0  . lisinopril-hydrochlorothiazide (PRINZIDE) 20-12.5 MG per tablet Take 1 tablet by mouth daily.  30 tablet  11  . loratadine (CLARITIN) 10 MG tablet Take 10 mg by mouth daily.      Marland Kitchen LORazepam (ATIVAN) 0.5 MG tablet Take 1 tablet (0.5 mg total) by mouth at bedtime as needed for anxiety (Nausea or vomiting).  30 tablet  0  . metoprolol succinate (TOPROL-XL) 100 MG 24 hr tablet TAKE ONE TABLET BY MOUTH EVERY DAY  30 tablet  11  . Multiple Vitamin (MULTIVITAMIN) capsule Take 1 capsule by mouth daily.         . Naproxen Sodium (ALEVE) 220 MG CAPS Take 1 capsule by mouth as needed.      Marland Kitchen oxyCODONE-acetaminophen (ROXICET) 5-325 MG per tablet Take 1 tablet by mouth every 6 (six) hours as needed for pain.  20 tablet  0  . prochlorperazine (COMPAZINE) 10 MG tablet Take 1 tablet (10 mg total) by mouth every 6 (six) hours as needed (Nausea or vomiting).  30 tablet  1  . rosuvastatin (CRESTOR) 20 MG tablet Take 1 tablet (20 mg total) by mouth daily.  30 tablet  11  . venlafaxine XR (EFFEXOR-XR) 75 MG 24 hr capsule Take 1 capsule (75 mg total) by mouth daily.  30 capsule  6   No current facility-administered medications for this visit.    OBJECTIVE: Middle-aged white woman in no acute distress Filed Vitals:   12/16/12 1324  BP: 116/68  Pulse: 101  Temp: 99 F (37.2 C)  Resp: 20     Body mass index is 24.31 kg/(m^2).    ECOG FS: 1 Filed Weights   12/16/12 1324  Weight: 164 lb 11.2 oz (74.707 kg)    Sclerae unicteric Oropharynx show a white coating on the tongue and posterior buccal mucosa consistent with candida;  No ulcerations noted No cervical or supraclavicular adenopathy; Lungs clear to auscultation bilaterally with no rales or rhonchi Heart regular rate and rhythm Abdomen soft, nontender, positive bowel sounds MSK scoliosis but no focal spinal tenderness to palpation, no peripheral edema Neuro: nonfocal, well oriented, pleasant affect Breasts: Deferred;  Axillae are benign bilaterally with no palpable adenopathy   LAB RESULTS: Lab Results  Component Value Date   WBC 0.5* 12/16/2012   NEUTROABS 0.1* 12/16/2012   HGB 10.7* 12/16/2012   HCT 30.9* 12/16/2012   MCV 84.0 12/16/2012   PLT 78* 12/16/2012      Chemistry      Component Value Date/Time   NA 135* 12/09/2012 1232   NA 138 06/04/2012 1124   K 5.0 12/09/2012 1232   K 4.3 06/04/2012 1124   CL 102 12/09/2012 1232   CL 103 06/04/2012 1124   CO2 25 12/09/2012 1232   CO2 29 06/04/2012 1124   BUN 15.0 12/09/2012 1232   BUN 14  06/04/2012 1124  CREATININE 0.8 12/09/2012 1232   CREATININE 0.7 06/04/2012 1124      Component Value Date/Time   CALCIUM 9.5 12/09/2012 1232   CALCIUM 9.9 06/04/2012 1124   ALKPHOS 85 12/09/2012 1232   ALKPHOS 47 06/04/2012 1124   AST 16 12/09/2012 1232   AST 27 06/04/2012 1124   ALT 14 12/09/2012 1232   ALT 32 06/04/2012 1124   BILITOT 0.46 12/09/2012 1232   BILITOT 1.1 06/04/2012 1124        STUDIES: No results found.   ASSESSMENT: 72 y.o. [place] woman   (1)  status post right breast and right axillary lymph node biopsies February 2014 of a clinical T2, pN1, stage IIB,  invasive ductal carcinoma, grade 3, triple negative, with an MIB-1 of 15%  (2)  a third biopsy from a different quadrant 10/23/2012 showed ductal carcinoma in situ  (3)  currently being treated in the neoadjuvant setting, the goal being to complete 4 dose dense cycles of doxorubicin/cyclophosphamide followed by 12 weekly doses of paclitaxel and carboplatin prior to definitive surgery. First dose of chemotherapy was given on 11/11/2012.  (4) family history suggestive of Lynch syndrome; genetics evaluation in process  PLAN:  Debra Mcintyre's counts are the lowest they have been with this cycle.  She will resume her prophylactic Cipro along with Diflucan with she takes for candidiasis, and she knows to call us with any fevers greater than 100.    She will return next Monday, May 12, for follow up in anticipation of her fourth and final dose of AC.   She and her husband are planning a trip to [facility] after the next treatment and we discussed starting her antibiotics prophylactically on day 6 of the next cycle since her counts were so low this time around.  She was given all of this information in writing today.    Finally, I want to treat Debra Mcintyre reflux which is likely associated with her dexamethasone.  She is on Plavix, so I am choosing to prescribe Pepcid instead of Prilosec to avoid any interactions.  After  treatment next week,  she will have a restaging MRI on 5/19. She will see Dr. Darnelle Catalan on 5/20  to discuss those results and to set her up for the following 12 weeks of Taxol/carboplatin.  She voices understanding of our plan and knows to call with any problems of questions.  Debra Mcintyre    12/16/2012

## 2012-12-21 ENCOUNTER — Other Ambulatory Visit: Payer: Self-pay | Admitting: Oncology

## 2012-12-21 DIAGNOSIS — C50919 Malignant neoplasm of unspecified site of unspecified female breast: Secondary | ICD-10-CM

## 2012-12-23 ENCOUNTER — Ambulatory Visit (HOSPITAL_BASED_OUTPATIENT_CLINIC_OR_DEPARTMENT_OTHER): Payer: Medicare Other | Admitting: Physician Assistant

## 2012-12-23 ENCOUNTER — Encounter: Payer: Self-pay | Admitting: Physician Assistant

## 2012-12-23 ENCOUNTER — Ambulatory Visit (HOSPITAL_BASED_OUTPATIENT_CLINIC_OR_DEPARTMENT_OTHER): Payer: Medicare Other

## 2012-12-23 ENCOUNTER — Other Ambulatory Visit (HOSPITAL_BASED_OUTPATIENT_CLINIC_OR_DEPARTMENT_OTHER): Payer: Medicare Other | Admitting: Lab

## 2012-12-23 VITALS — BP 145/80 | HR 103 | Temp 98.3°F | Resp 100 | Ht 69.0 in | Wt 165.5 lb

## 2012-12-23 DIAGNOSIS — Z170 Estrogen receptor positive status [ER+]: Secondary | ICD-10-CM | POA: Diagnosis not present

## 2012-12-23 DIAGNOSIS — C773 Secondary and unspecified malignant neoplasm of axilla and upper limb lymph nodes: Secondary | ICD-10-CM

## 2012-12-23 DIAGNOSIS — C50219 Malignant neoplasm of upper-inner quadrant of unspecified female breast: Secondary | ICD-10-CM

## 2012-12-23 DIAGNOSIS — C50119 Malignant neoplasm of central portion of unspecified female breast: Secondary | ICD-10-CM

## 2012-12-23 DIAGNOSIS — R599 Enlarged lymph nodes, unspecified: Secondary | ICD-10-CM

## 2012-12-23 DIAGNOSIS — Z5111 Encounter for antineoplastic chemotherapy: Secondary | ICD-10-CM | POA: Diagnosis not present

## 2012-12-23 DIAGNOSIS — C50919 Malignant neoplasm of unspecified site of unspecified female breast: Secondary | ICD-10-CM

## 2012-12-23 DIAGNOSIS — C50911 Malignant neoplasm of unspecified site of right female breast: Secondary | ICD-10-CM

## 2012-12-23 LAB — CBC WITH DIFFERENTIAL/PLATELET
Basophils Absolute: 0.1 10*3/uL (ref 0.0–0.1)
EOS%: 0 % (ref 0.0–7.0)
Eosinophils Absolute: 0 10*3/uL (ref 0.0–0.5)
HCT: 31.2 % — ABNORMAL LOW (ref 34.8–46.6)
HGB: 10.8 g/dL — ABNORMAL LOW (ref 11.6–15.9)
LYMPH%: 7 % — ABNORMAL LOW (ref 14.0–49.7)
MCH: 29.9 pg (ref 25.1–34.0)
MCV: 86.1 fL (ref 79.5–101.0)
MONO%: 6.9 % (ref 0.0–14.0)
NEUT#: 8.5 10*3/uL — ABNORMAL HIGH (ref 1.5–6.5)
NEUT%: 85.5 % — ABNORMAL HIGH (ref 38.4–76.8)
Platelets: 182 10*3/uL (ref 145–400)

## 2012-12-23 LAB — COMPREHENSIVE METABOLIC PANEL (CC13)
Albumin: 3.5 g/dL (ref 3.5–5.0)
Alkaline Phosphatase: 83 U/L (ref 40–150)
BUN: 10.2 mg/dL (ref 7.0–26.0)
Creatinine: 0.8 mg/dL (ref 0.6–1.1)
Glucose: 106 mg/dl — ABNORMAL HIGH (ref 70–99)
Potassium: 4.3 mEq/L (ref 3.5–5.1)
Total Bilirubin: 0.51 mg/dL (ref 0.20–1.20)

## 2012-12-23 MED ORDER — SODIUM CHLORIDE 0.9 % IV SOLN
150.0000 mg | Freq: Once | INTRAVENOUS | Status: AC
  Administered 2012-12-23: 150 mg via INTRAVENOUS
  Filled 2012-12-23: qty 5

## 2012-12-23 MED ORDER — SODIUM CHLORIDE 0.9 % IV SOLN
Freq: Once | INTRAVENOUS | Status: AC
  Administered 2012-12-23: 13:00:00 via INTRAVENOUS

## 2012-12-23 MED ORDER — SODIUM CHLORIDE 0.9 % IV SOLN
600.0000 mg/m2 | Freq: Once | INTRAVENOUS | Status: AC
  Administered 2012-12-23: 1160 mg via INTRAVENOUS
  Filled 2012-12-23: qty 58

## 2012-12-23 MED ORDER — DOXORUBICIN HCL CHEMO IV INJECTION 2 MG/ML
60.0000 mg/m2 | Freq: Once | INTRAVENOUS | Status: AC
  Administered 2012-12-23: 116 mg via INTRAVENOUS
  Filled 2012-12-23: qty 58

## 2012-12-23 MED ORDER — PALONOSETRON HCL INJECTION 0.25 MG/5ML
0.2500 mg | Freq: Once | INTRAVENOUS | Status: AC
  Administered 2012-12-23: 0.25 mg via INTRAVENOUS

## 2012-12-23 MED ORDER — DEXAMETHASONE SODIUM PHOSPHATE 20 MG/5ML IJ SOLN
12.0000 mg | Freq: Once | INTRAMUSCULAR | Status: AC
  Administered 2012-12-23: 20 mg via INTRAVENOUS

## 2012-12-23 MED ORDER — HEPARIN SOD (PORK) LOCK FLUSH 100 UNIT/ML IV SOLN
500.0000 [IU] | Freq: Once | INTRAVENOUS | Status: AC | PRN
  Administered 2012-12-23: 500 [IU]
  Filled 2012-12-23: qty 5

## 2012-12-23 MED ORDER — SODIUM CHLORIDE 0.9 % IJ SOLN
10.0000 mL | INTRAMUSCULAR | Status: DC | PRN
  Administered 2012-12-23: 10 mL
  Filled 2012-12-23: qty 10

## 2012-12-23 NOTE — Progress Notes (Signed)
ID: Debra Mcintyre   DOB: 17-Dec-1940  MR#: [id number]  RSW#:[id number]  PCP: [name], MD GYN: [name], [name], [name],   HISTORY OF PRESENT ILLNESS: Debra Mcintyre had screening mammography at [facility] 04/10/2012 raising the question of some axillary lymph nodes on the right. Additional views of the right axilla and right axillary ultrasound performed 04/04/2012 showed 3 lymph nodes that appeared larger than prior. The largest measured 1.5 cm. 2 of them had cortical thickening. Close followup was suggested, and repeat right axillary ultrasound 06/25/2012 showed no significant change in the lymph nodes in question. Followup ultrasound in 3 months was recommended, but in the interim the patient saw her primary physician, Dr. [name] and ADL, and he set her up for a chest CT on 07/08/2012, which showed a mild asymmetric density in the upper mid right breast. There was a prominent right axillary lymph node measuring 1.9 cm, with a few smaller adjacent nodes asymmetric with compared to the left side. Again, short interval followup was recommended, and on 10/07/2012 the patient had digital right mammography and ultrasonography, now at the breast Center. Dr. [name] noted pleomorphic calcifications spanning an area of 6.3 cm without associated mass. Physical exam was unremarkable. Ultrasound showed an area of adenopathy in the right axilla measuring 1.6 cm, with a second lymph node with a thickened cortex measuring 1.2 cm. No suspicious mass was seen by ultrasound in the right breast.  Biopsy of the larger axillary lymph node on 10/07/2012 showed ([id number]) an invasive ductal carcinoma, triple negative, with an MIB-1 of 66%. Biopsy of the right breast the next day, [id number]) showed invasive ductal carcinoma, grade 3. Breast MRI 10/14/2012 showed a total area of irregular enhancement in the right breast measuring up to 12 cm. MRI guided biopsy of an  area in the upper inner quadrant of the right breast on 10/23/2012 showed ([id number]) ductal carcinoma in situ, with foci worrisome for invasion.  The patient's subsequent history is as detailed below  INTERVAL HISTORY: Debra Mcintyre returns today for followup of her locally advanced right breast cancer. Today is day 1 cycle 4 of her 4 planned cycles of neoadjuvant doxorubicin/cyclophosphamide, to be followed by weekly paclitaxel/ carboplatin.  Debra Mcintyre continues to tolerate treatment well and when asked what her biggest concern [place] today she tells me "I don't have any". She is feeling well. Her energy level is good.  REVIEW OF SYSTEMS: [name] has had no fevers or chills. She denies any rashes, abnormal bruising, or bleeding. She's eating well and has had no nausea or change in bowel habits. She denies any cough or increased shortness of breath. She's had no chest pain or palpitations denies any abnormal headaches. She also denies any unusual myalgias, arthralgias, or bony pain and has had no peripheral swelling.  A detailed review of systems is otherwise noncontributory.   PAST MEDICAL HISTORY: Past Medical History  Diagnosis Date  . Hypertension   . Cerebrovascular disease, unspecified   . Cerebral aneurysm, nonruptured   . Pure hypercholesterolemia   . Hypothyroidism   . Colonic polyp   . Breast cancer 09/2012    right breast/right axillary lymph node t2,pn1, stage 11b, invasive ductal carcinma, grade 3, triple negative, with an mib-1 of 15%  . History of chemotherapy     PAST SURGICAL HISTORY: Past Surgical History  Procedure Laterality Date  . Left middle cerebral artery angioplasty  2004    by [name] had 2 follow  up occurances  . Portacath placement N/A 11/05/2012    Procedure: INSERTION PORT-A-CATH;  Surgeon: [name], MD;  Location: [facility];  Service: General;  Laterality: N/A;  . Breast biopsy Right 10/23/2012  . Breast biopsy Right 10/08/2012     FAMILY HISTORY Family History  Problem Relation Age of Onset  . Lung cancer Mother   . Heart disease Father   . Colon cancer Brother   . Alcohol abuse Brother   . Colon cancer Maternal Uncle    the patient's father died at the age of 42. The patient's mother died at the age of 29 from lung cancer. She was not a smoker. The cancer was diagnosed when she was 70 years old. The patient's mother had 2 sisters and one brother. One of those sisters had lung cancer and a brother had colon cancer, both diagnosed in their 45s. The patient herself has one brother who died from colon cancer at the age of 86. That brother has a daughter who was diagnosed with uterine cancer at the age of 73. All this raises the question of possible Lynch syndrome and I have referred the patient for genetics counseling.  GYNECOLOGIC HISTORY: Menarche age 61, menopause around 27. The patient is GX P0. She took birth control pills for many years with no complications.  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 [name] who lives in "[place]" and is financial vice president of [employer], and Waveland who lives in [place] and works as a Transport planner. The patient has 5 grandchildren. She is currently not a church attender   ADVANCED DIRECTIVES: Not in place; this was discussed 10/29/2012 and the patient was advised to complete a healthcare power of attorney document  HEALTH MAINTENANCE: History  Substance Use Topics  . Smoking status: Former Smoker -- 1.00 packs/day for 20 years    Types: Cigarettes    Quit date: 04/02/1986  . Smokeless tobacco: Never Used  . Alcohol Use: No     Colonoscopy: 2012  PAP: 2012  Bone density:  Lipid panel:  No Known Allergies  Current Outpatient Prescriptions  Medication Sig Dispense Refill  . acyclovir (ZOVIRAX) 400 MG tablet Take 1 tablet (400 mg total)  by mouth 2 (two) times daily.  60 tablet  2  . Alum & Mag Hydroxide-Simeth (MAGIC MOUTHWASH W/LIDOCAINE) SOLN Take 5 mLs by mouth 3 (three) times daily as needed.  100 mL  1  . aspirin 325 MG tablet Take 325 mg by mouth daily.        . Calcium Carbonate-Vitamin D (CALCIUM 600+D) 600-200 MG-UNIT TABS Take 1 tablet by mouth 2 (two) times daily.        . ciprofloxacin (CIPRO) 500 MG tablet Take 1 tablet (500 mg total) by mouth 2 (two) times daily.  14 tablet  3  . clopidogrel (PLAVIX) 75 MG tablet TAKE ONE TABLET BY MOUTH EVERY DAY  30 tablet  6  . dexamethasone (DECADRON) 4 MG tablet Take 2 tablets by mouth once a day on the evening of chemotherapy and then take 2 tablets two times a day for 2 days. Take with food.  30 tablet  1  . famotidine (PEPCID) 40 MG tablet Take 1 tablet (40 mg total) by mouth daily.  30 tablet  3  . fluconazole (DIFLUCAN) 100 MG tablet Take 1 tablet (100 mg total) by mouth  daily.  7 tablet  3  . levothyroxine (SYNTHROID, LEVOTHROID) 125 MCG tablet Take 1 tablet (125 mcg total) by mouth daily.  30 tablet  11  . lidocaine-prilocaine (EMLA) cream Apply topically as needed. Apply over port 1-2 hors before chemo and cover with plastic wrap  30 g  0  . loratadine (CLARITIN) 10 MG tablet Take 10 mg by mouth daily.      Marland Kitchen LORazepam (ATIVAN) 0.5 MG tablet Take 1 tablet (0.5 mg total) by mouth at bedtime as needed for anxiety (Nausea or vomiting).  30 tablet  0  . metoprolol succinate (TOPROL-XL) 100 MG 24 hr tablet TAKE ONE TABLET BY MOUTH EVERY DAY  30 tablet  11  . Multiple Vitamin (MULTIVITAMIN) capsule Take 1 capsule by mouth daily.        . Naproxen Sodium (ALEVE) 220 MG CAPS Take 1 capsule by mouth as needed.      Marland Kitchen oxyCODONE-acetaminophen (ROXICET) 5-325 MG per tablet Take 1 tablet by mouth every 6 (six) hours as needed for pain.  20 tablet  0  . prochlorperazine (COMPAZINE) 10 MG tablet Take 1 tablet (10 mg total) by mouth every 6 (six) hours as needed (Nausea or vomiting).  30  tablet  1  . rosuvastatin (CRESTOR) 20 MG tablet Take 1 tablet (20 mg total) by mouth daily.  30 tablet  11  . venlafaxine XR (EFFEXOR-XR) 75 MG 24 hr capsule Take 1 capsule (75 mg total) by mouth daily.  30 capsule  6  . lisinopril-hydrochlorothiazide (PRINZIDE) 20-12.5 MG per tablet Take 1 tablet by mouth daily.  30 tablet  11   No current facility-administered medications for this visit.   Facility-Administered Medications Ordered in Other Visits  Medication Dose Route Frequency Provider Last Rate Last Dose  . cyclophosphamide (CYTOXAN) 1,160 mg in sodium chloride 0.9 % 250 mL chemo infusion  600 mg/m2 (Treatment Plan Actual) Intravenous Once Lowella Dell, MD      . DOXOrubicin (ADRIAMYCIN) chemo injection 116 mg  60 mg/m2 (Treatment Plan Actual) Intravenous Once Lowella Dell, MD      . heparin lock flush 100 unit/mL  500 Units Intracatheter Once PRN Lowella Dell, MD      . sodium chloride 0.9 % injection 10 mL  10 mL Intracatheter PRN Lowella Dell, MD        OBJECTIVE: Middle-aged white woman in no acute distress Filed Vitals:   12/23/12 1156  BP: 145/80  Pulse: 103  Temp: 98.3 F (36.8 C)  Resp: 100     Body mass index is 24.43 kg/(m^2).    ECOG FS: 1 Filed Weights   12/23/12 1156  Weight: 165 lb 8 oz (75.07 kg)   Sclerae unicteric Oropharynx clear, no ulcerations noted No cervical or supraclavicular adenopathy; Lungs clear to auscultation bilaterally with no rales or rhonchi, no wheezes. Heart regular rate and rhythm Abdomen soft, nontender, positive bowel sounds MSK no focal spinal tenderness to palpation, no peripheral edema Neuro: nonfocal, well oriented, pleasant affect Breasts: Unable to palpate a distinct mass in the right breast or right axilla. Left breast is unremarkable, left axilla unremarkable.   LAB RESULTS: Lab Results  Component Value Date   WBC 9.9 12/23/2012   NEUTROABS 8.5* 12/23/2012   HGB 10.8* 12/23/2012   HCT 31.2* 12/23/2012    MCV 86.1 12/23/2012   PLT 182 12/23/2012      Chemistry      Component Value Date/Time   NA 139 12/23/2012  1127   NA 138 06/04/2012 1124   K 4.3 12/23/2012 1127   K 4.3 06/04/2012 1124   CL 106 12/23/2012 1127   CL 103 06/04/2012 1124   CO2 25 12/23/2012 1127   CO2 29 06/04/2012 1124   BUN 10.2 12/23/2012 1127   BUN 14 06/04/2012 1124   CREATININE 0.8 12/23/2012 1127   CREATININE 0.7 06/04/2012 1124      Component Value Date/Time   CALCIUM 9.2 12/23/2012 1127   CALCIUM 9.9 06/04/2012 1124   ALKPHOS 83 12/23/2012 1127   ALKPHOS 47 06/04/2012 1124   AST 17 12/23/2012 1127   AST 27 06/04/2012 1124   ALT 14 12/23/2012 1127   ALT 32 06/04/2012 1124   BILITOT 0.51 12/23/2012 1127   BILITOT 1.1 06/04/2012 1124       Lab Results  Component Value Date   LABCA2 6 10/29/2012    STUDIES: No results found.  ASSESSMENT: 72 y.o. [place] woman   (1)  status post right breast and right axillary lymph node biopsies February 2014 of a clinical T2, pN1, stage IIB,  invasive ductal carcinoma, grade 3, triple negative, with an MIB-1 of 15%  (2)  a third biopsy from a different quadrant 10/23/2012 showed ductal carcinoma in situ  (3)  currently being treated in the neoadjuvant setting, the goal being to complete 4 dose dense cycles of doxorubicin/cyclophosphamide followed by 12 weekly doses of paclitaxel and carboplatin prior to definitive surgery. First dose of chemotherapy was given on 11/11/2012.  (4) family history suggestive of Lynch syndrome; genetics evaluation in process  PLAN:  Debra Mcintyre will proceed to treatment today as scheduled for her fourth and final cycle of dose dense doxorubicin/cyclophosphamide. She will receive Neulasta tomorrow on day 2. She scheduled for repeat breast MRI next week to assess response to neoadjuvant chemotherapy, and we'll see Dr. Darnelle Catalan to review those results on may 20th. At that time, he will also be reviewing with Debra Mcintyre her upcoming regimen of weekly  paclitaxel/carboplatin, and she scheduled for her first cycle on M and ay 28. (Treatment will be on a Wednesday that week secondary to the [facility] Day holiday. After cycle 1, she would like all of her treatments to be scheduled for Tuesdays.)   She voices understanding and agree with our plan, and will call should any changes or problems.  Petrina Melby    12/23/2012

## 2012-12-23 NOTE — Patient Instructions (Addendum)
[facility] Discharge Instructions for Patients Receiving Chemotherapy  Today you received the following chemotherapy agents Doxorubicin and Cytoxan.  To help prevent nausea and vomiting after your treatment, we encourage you to take your nausea medication. Begin taking your nausea medication as often as prescribed for by Dr. Darnelle Mcintyre.   If you develop nausea and vomiting that is not controlled by your nausea medication, call the clinic. If it is after clinic hours your family physician or the after hours number for the clinic or go to the Emergency Department.   BELOW ARE SYMPTOMS THAT SHOULD BE REPORTED IMMEDIATELY:  *FEVER GREATER THAN 100.5 F  *CHILLS WITH OR WITHOUT FEVER  NAUSEA AND VOMITING THAT IS NOT CONTROLLED WITH YOUR NAUSEA MEDICATION  *UNUSUAL SHORTNESS OF BREATH  *UNUSUAL BRUISING OR BLEEDING  TENDERNESS IN MOUTH AND THROAT WITH OR WITHOUT PRESENCE OF ULCERS  *URINARY PROBLEMS  *BOWEL PROBLEMS  UNUSUAL RASH Items with * indicate a potential emergency and should be followed up as soon as possible.  One of the nurses will contact you 24 hours after your treatment. Please let the nurse know about any problems that you may have experienced. Feel free to call the clinic you have any questions or concerns. The clinic phone number is [phone].   I have been informed and understand all the instructions given to me. I know to contact the clinic, my physician, or go to the Emergency Department if any problems should occur. I do not have any questions at this time, but understand that I may call the clinic during office hours   should I have any questions or need assistance in obtaining follow up care.    __________________________________________  _____________  __________ Signature of Patient or Authorized Representative            Date                   Time    __________________________________________ Nurse's Signature

## 2012-12-24 ENCOUNTER — Telehealth: Payer: Self-pay | Admitting: Oncology

## 2012-12-24 ENCOUNTER — Ambulatory Visit (HOSPITAL_BASED_OUTPATIENT_CLINIC_OR_DEPARTMENT_OTHER): Payer: Medicare Other

## 2012-12-24 ENCOUNTER — Telehealth: Payer: Self-pay | Admitting: *Deleted

## 2012-12-24 VITALS — BP 139/58 | HR 81 | Temp 97.3°F | Resp 20

## 2012-12-24 DIAGNOSIS — R599 Enlarged lymph nodes, unspecified: Secondary | ICD-10-CM

## 2012-12-24 DIAGNOSIS — C50219 Malignant neoplasm of upper-inner quadrant of unspecified female breast: Secondary | ICD-10-CM | POA: Diagnosis not present

## 2012-12-24 DIAGNOSIS — C773 Secondary and unspecified malignant neoplasm of axilla and upper limb lymph nodes: Secondary | ICD-10-CM | POA: Diagnosis not present

## 2012-12-24 DIAGNOSIS — C50911 Malignant neoplasm of unspecified site of right female breast: Secondary | ICD-10-CM

## 2012-12-24 DIAGNOSIS — C50119 Malignant neoplasm of central portion of unspecified female breast: Secondary | ICD-10-CM

## 2012-12-24 MED ORDER — PEGFILGRASTIM INJECTION 6 MG/0.6ML
6.0000 mg | Freq: Once | SUBCUTANEOUS | Status: AC
  Administered 2012-12-24: 6 mg via SUBCUTANEOUS
  Filled 2012-12-24: qty 0.6

## 2012-12-24 NOTE — Telephone Encounter (Signed)
Per staff message and POF I have scheduled appts.  JMW  

## 2012-12-30 ENCOUNTER — Ambulatory Visit
Admission: RE | Admit: 2012-12-30 | Discharge: 2012-12-30 | Disposition: A | Discharge status: Home / Self Care | Payer: Medicare Other | Source: Ambulatory Visit | Attending: Oncology | Admitting: Oncology

## 2012-12-30 DIAGNOSIS — C773 Secondary and unspecified malignant neoplasm of axilla and upper limb lymph nodes: Secondary | ICD-10-CM

## 2012-12-30 DIAGNOSIS — C50111 Malignant neoplasm of central portion of right female breast: Secondary | ICD-10-CM

## 2012-12-30 DIAGNOSIS — C50919 Malignant neoplasm of unspecified site of unspecified female breast: Secondary | ICD-10-CM | POA: Diagnosis not present

## 2012-12-30 MED ORDER — GADOBENATE DIMEGLUMINE 529 MG/ML IV SOLN
15.0000 mL | Freq: Once | INTRAVENOUS | Status: AC | PRN
  Administered 2012-12-30: 15 mL via INTRAVENOUS

## 2012-12-31 ENCOUNTER — Telehealth: Payer: Self-pay | Admitting: *Deleted

## 2012-12-31 ENCOUNTER — Other Ambulatory Visit (HOSPITAL_BASED_OUTPATIENT_CLINIC_OR_DEPARTMENT_OTHER): Payer: Medicare Other | Admitting: Lab

## 2012-12-31 ENCOUNTER — Ambulatory Visit (HOSPITAL_BASED_OUTPATIENT_CLINIC_OR_DEPARTMENT_OTHER): Payer: Medicare Other | Admitting: Oncology

## 2012-12-31 VITALS — BP 116/67 | HR 105 | Temp 98.2°F | Resp 20 | Ht 69.0 in | Wt 164.2 lb

## 2012-12-31 DIAGNOSIS — C50119 Malignant neoplasm of central portion of unspecified female breast: Secondary | ICD-10-CM | POA: Diagnosis not present

## 2012-12-31 DIAGNOSIS — C50111 Malignant neoplasm of central portion of right female breast: Secondary | ICD-10-CM

## 2012-12-31 DIAGNOSIS — D0590 Unspecified type of carcinoma in situ of unspecified breast: Secondary | ICD-10-CM | POA: Diagnosis not present

## 2012-12-31 DIAGNOSIS — C773 Secondary and unspecified malignant neoplasm of axilla and upper limb lymph nodes: Secondary | ICD-10-CM | POA: Diagnosis not present

## 2012-12-31 DIAGNOSIS — C50919 Malignant neoplasm of unspecified site of unspecified female breast: Secondary | ICD-10-CM

## 2012-12-31 LAB — CBC WITH DIFFERENTIAL/PLATELET
BASO%: 8.6 % — ABNORMAL HIGH (ref 0.0–2.0)
Basophils Absolute: 0.1 10*3/uL (ref 0.0–0.1)
Eosinophils Absolute: 0 10*3/uL (ref 0.0–0.5)
HCT: 28.4 % — ABNORMAL LOW (ref 34.8–46.6)
LYMPH%: 41.4 % (ref 14.0–49.7)
MCHC: 34.2 g/dL (ref 31.5–36.0)
MONO#: 0.1 10*3/uL (ref 0.1–0.9)
NEUT%: 31 % — ABNORMAL LOW (ref 38.4–76.8)
Platelets: 135 10*3/uL — ABNORMAL LOW (ref 145–400)
WBC: 0.6 10*3/uL — CL (ref 3.9–10.3)

## 2012-12-31 LAB — COMPREHENSIVE METABOLIC PANEL (CC13)
ALT: 13 U/L (ref 0–55)
AST: 15 U/L (ref 5–34)
Alkaline Phosphatase: 69 U/L (ref 40–150)
CO2: 25 mEq/L (ref 22–29)
Creatinine: 0.7 mg/dL (ref 0.6–1.1)
Sodium: 138 mEq/L (ref 136–145)
Total Bilirubin: 0.41 mg/dL (ref 0.20–1.20)
Total Protein: 6.5 g/dL (ref 6.4–8.3)

## 2012-12-31 NOTE — Telephone Encounter (Signed)
Scheduled the appts. i sent MW an email to add the tx then i will look back over it and call the pt....td

## 2012-12-31 NOTE — Progress Notes (Signed)
ID: Debra Mcintyre   DOB: 14-Nov-1940  MR#: [id number]  WUJ#:[id number]  PCP: [name], MD GYN: [name] SU: Debra Mcintyre OTHER MD: [name], [name], [name],   HISTORY OF PRESENT ILLNESS: Debra Mcintyre had screening mammography at [facility] 04/10/2012 raising the question of some axillary lymph nodes on the right. Additional views of the right axilla and right axillary ultrasound performed 04/04/2012 showed 3 lymph nodes that appeared larger than prior. The largest measured 1.5 cm. 2 of them had cortical thickening. Close followup was suggested, and repeat right axillary ultrasound 06/25/2012 showed no significant change in the lymph nodes in question. Followup ultrasound in 3 months was recommended, but in the interim the patient saw her primary physician, Dr. [name] and ADL, and he set her up for a chest CT on 07/08/2012, which showed a mild asymmetric density in the upper mid right breast. There was a prominent right axillary lymph node measuring 1.9 cm, with a few smaller adjacent nodes asymmetric with compared to the left side. Again, short interval followup was recommended, and on 10/07/2012 the patient had digital right mammography and ultrasonography, now at the breast Center. Dr. [name] noted pleomorphic calcifications spanning an area of 6.3 cm without associated mass. Physical exam was unremarkable. Ultrasound showed an area of adenopathy in the right axilla measuring 1.6 cm, with a second lymph node with a thickened cortex measuring 1.2 cm. No suspicious mass was seen by ultrasound in the right breast.  Biopsy of the larger axillary lymph node on 10/07/2012 showed ([id number]) an invasive ductal carcinoma, triple negative, with an MIB-1 of 66%. Biopsy of the right breast the next day, [id number]) showed invasive ductal carcinoma, grade 3. Breast MRI 10/14/2012 showed a total area of irregular enhancement in the right breast measuring up to 12 cm. MRI guided biopsy of an  area in the upper inner quadrant of the right breast on 10/23/2012 showed ([id number]) ductal carcinoma in situ, with foci worrisome for invasion.  The patient's subsequent history is as detailed below  INTERVAL HISTORY: Debra Mcintyre returns today for followup of her locally advanced right breast cancer. Today is day 9 cycle 4 of her 4 planned cycles of neoadjuvant doxorubicin/cyclophosphamide, to be followed by weekly paclitaxel/ carboplatin.  REVIEW OF SYSTEMS: Debra Mcintyre did remarkably well with her first 4 cycles of chemotherapy, with no nausea or vomiting, mild fatigue, and some mouth sores as the chief problems. She just came back from the beach which she did quite a bit of walking and she developed blisters between her first and second toes in both feet, as well as irritation in her heels. She feels a little short of breath sometimes when walking up stairs but gets over to the top without stopping. Otherwise a detailed review of systems today was stable  PAST MEDICAL HISTORY: Past Medical History  Diagnosis Date  . Hypertension   . Cerebrovascular disease, unspecified   . Cerebral aneurysm, nonruptured   . Pure hypercholesterolemia   . Hypothyroidism   . Colonic polyp   . Breast cancer 09/2012    right breast/right axillary lymph node t2,pn1, stage 11b, invasive ductal carcinma, grade 3, triple negative, with an mib-1 of 15%  . History of chemotherapy     PAST SURGICAL HISTORY: Past Surgical History  Procedure Laterality Date  . Left middle cerebral artery angioplasty  2004    by [name] had 2 follow up occurances  . Portacath placement N/A 11/05/2012    Procedure: INSERTION PORT-A-CATH;  Surgeon:  Debra Loron, MD;  Location: [facility];  Service: General;  Laterality: N/A;  . Breast biopsy Right 10/23/2012  . Breast biopsy Right 10/08/2012    FAMILY HISTORY Family History  Problem Relation Age of Onset  . Lung cancer Mother   . Heart disease Father   .  Colon cancer Brother   . Alcohol abuse Brother   . Colon cancer Maternal Uncle    the patient's father died at the age of 55. The patient's mother died at the age of 58 from lung cancer. She was not a smoker. The cancer was diagnosed when she was 70 years old. The patient's mother had 2 sisters and one brother. One of those sisters had lung cancer and a brother had colon cancer, both diagnosed in their 11s. The patient herself has one brother who died from colon cancer at the age of 19. That brother has a daughter who was diagnosed with uterine cancer at the age of 63. All this raises the question of possible Lynch syndrome and I have referred the patient for genetics counseling.  GYNECOLOGIC HISTORY: Menarche age 59, menopause around 3. The patient is GX P0. She took birth control pills for many years with no complications.  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 [name] who lives in "[place]" and is financial vice president of [employer], and Macy who lives in [place] and works as a Transport planner. The patient has 5 grandchildren. She is currently not a church attender   ADVANCED DIRECTIVES: Not in place; this was discussed 10/29/2012 and the patient was advised to complete a healthcare power of attorney document  HEALTH MAINTENANCE: History  Substance Use Topics  . Smoking status: Former Smoker -- 1.00 packs/day for 20 years    Types: Cigarettes    Quit date: 04/02/1986  . Smokeless tobacco: Never Used  . Alcohol Use: No     Colonoscopy: 2012  PAP: 2012  Bone density:  Lipid panel:  No Known Allergies  Current Outpatient Prescriptions  Medication Sig Dispense Refill  . acyclovir (ZOVIRAX) 400 MG tablet Take 1 tablet (400 mg total) by mouth 2 (two) times daily.  60 tablet  2  . Alum & Mag Hydroxide-Simeth (MAGIC MOUTHWASH W/LIDOCAINE) SOLN Take 5 mLs  by mouth 3 (three) times daily as needed.  100 mL  1  . aspirin 325 MG tablet Take 325 mg by mouth daily.        . Calcium Carbonate-Vitamin D (CALCIUM 600+D) 600-200 MG-UNIT TABS Take 1 tablet by mouth 2 (two) times daily.        . ciprofloxacin (CIPRO) 500 MG tablet Take 1 tablet (500 mg total) by mouth 2 (two) times daily.  14 tablet  3  . clopidogrel (PLAVIX) 75 MG tablet TAKE ONE TABLET BY MOUTH EVERY DAY  30 tablet  6  . dexamethasone (DECADRON) 4 MG tablet Take 2 tablets by mouth once a day on the evening of chemotherapy and then take 2 tablets two times a day for 2 days. Take with food.  30 tablet  1  . famotidine (PEPCID) 40 MG tablet Take 1 tablet (40 mg total) by mouth daily.  30 tablet  3  . fluconazole (DIFLUCAN) 100 MG tablet Take 1 tablet (100 mg total) by mouth daily.  7 tablet  3  . levothyroxine (SYNTHROID, LEVOTHROID) 125 MCG tablet Take 1  tablet (125 mcg total) by mouth daily.  30 tablet  11  . lidocaine-prilocaine (EMLA) cream Apply topically as needed. Apply over port 1-2 hors before chemo and cover with plastic wrap  30 g  0  . lisinopril-hydrochlorothiazide (PRINZIDE) 20-12.5 MG per tablet Take 1 tablet by mouth daily.  30 tablet  11  . loratadine (CLARITIN) 10 MG tablet Take 10 mg by mouth daily.      Marland Kitchen LORazepam (ATIVAN) 0.5 MG tablet Take 1 tablet (0.5 mg total) by mouth at bedtime as needed for anxiety (Nausea or vomiting).  30 tablet  0  . metoprolol succinate (TOPROL-XL) 100 MG 24 hr tablet TAKE ONE TABLET BY MOUTH EVERY DAY  30 tablet  11  . Multiple Vitamin (MULTIVITAMIN) capsule Take 1 capsule by mouth daily.        . Naproxen Sodium (ALEVE) 220 MG CAPS Take 1 capsule by mouth as needed.      Marland Kitchen oxyCODONE-acetaminophen (ROXICET) 5-325 MG per tablet Take 1 tablet by mouth every 6 (six) hours as needed for pain.  20 tablet  0  . prochlorperazine (COMPAZINE) 10 MG tablet Take 1 tablet (10 mg total) by mouth every 6 (six) hours as needed (Nausea or vomiting).  30 tablet   1  . rosuvastatin (CRESTOR) 20 MG tablet Take 1 tablet (20 mg total) by mouth daily.  30 tablet  11  . venlafaxine XR (EFFEXOR-XR) 75 MG 24 hr capsule Take 1 capsule (75 mg total) by mouth daily.  30 capsule  6   No current facility-administered medications for this visit.    OBJECTIVE: Middle-aged white woman in no acute distress Filed Vitals:   12/31/12 1121  BP: 116/67  Pulse: 105  Temp: 98.2 F (36.8 C)  Resp: 20     Body mass index is 24.24 kg/(m^2).    ECOG FS: 1 Filed Weights   12/31/12 1121  Weight: 164 lb 3.2 oz (74.481 kg)   Sclerae unicteric Oropharynx clear, no lesions noted No cervical or supraclavicular adenopathy; Lungs clear to auscultation bilaterally  Heart regular rate and rhythm Abdomen soft, nontender, positive bowel sounds MSK no focal spinal tenderness, no peripheral edema Neuro: nonfocal, well oriented, pleasant affect Breasts: Unable to palpate a distinct mass in the right breast or right axilla. Left breast is unremarkable, left axilla unremarkable.   LAB RESULTS: Lab Results  Component Value Date   WBC 9.9 12/23/2012   NEUTROABS 8.5* 12/23/2012   HGB 10.8* 12/23/2012   HCT 31.2* 12/23/2012   MCV 86.1 12/23/2012   PLT 182 12/23/2012      Chemistry      Component Value Date/Time   NA 139 12/23/2012 1127   NA 138 06/04/2012 1124   K 4.3 12/23/2012 1127   K 4.3 06/04/2012 1124   CL 106 12/23/2012 1127   CL 103 06/04/2012 1124   CO2 25 12/23/2012 1127   CO2 29 06/04/2012 1124   BUN 10.2 12/23/2012 1127   BUN 14 06/04/2012 1124   CREATININE 0.8 12/23/2012 1127   CREATININE 0.7 06/04/2012 1124      Component Value Date/Time   CALCIUM 9.2 12/23/2012 1127   CALCIUM 9.9 06/04/2012 1124   ALKPHOS 83 12/23/2012 1127   ALKPHOS 47 06/04/2012 1124   AST 17 12/23/2012 1127   AST 27 06/04/2012 1124   ALT 14 12/23/2012 1127   ALT 32 06/04/2012 1124   BILITOT 0.51 12/23/2012 1127   BILITOT 1.1 06/04/2012 1124  Lab Results  Component Value Date    LABCA2 6 10/29/2012    STUDIES: Mr Breast Bilateral W Wo Contrast  12/30/2012   *RADIOLOGY REPORT*  Clinical Data: Known right breast grade III invasive ductal carcinoma with lymphovascular invasion and a metastatic right axillary lymph node.  The patient underwent an MR guided core biopsy 8 cm posterior and superior to the original biopsy, which showed DCIS with foci worrisome for invasion.  BILATERAL BREAST MRI WITH AND WITHOUT CONTRAST  Technique: Multiplanar, multisequence MR images of both breasts were obtained prior to and following the intravenous administration of 15ml of multihance.  Three dimensional images were evaluated at the independent DynaCad workstation.  Comparison:  Mammograms dated 10/23/2012, 10/08/2012, 10/07/2012 and 08/20/2013and prior MRIs dated 10/14/2012 and 10/23/2012.  Findings:  Right breast:  There has been good interval response to the neoadjuvant chemotherapy.  The irregular linear and stippled enhancement throughout the entire upper central right breast is smaller and shows less enhancement.  The enhancement measures  4.0 x 11.0 x 8.0 cm (transverse x AP x CC).  On the prior MRI dated 11/11/2011 it measured 8 x 12 x 7 cm.  Two biopsy clip artifacts located 7 cm apart are seen in the breast.  Left breast:  No abnormal enhancement seen in the left breast.  There has been significant interval reduction in the size of the level I right axillary adenopathy.  Right axillary lymph nodes persist but are not pathologically enlarged.  IMPRESSION: Positive interval response to neoadjuvant chemotherapy with less enhancement in the right breast and reduction in size of the axillary adenopathy.  RECOMMENDATION: Treatment planning.  THREE-DIMENSIONAL MR IMAGE RENDERING ON INDEPENDENT WORKSTATION:  Three-dimensional MR images were rendered by post-processing of the original MR data on an independent workstation.  The three- dimensional MR images were interpreted, and findings were reported in  the accompanying complete MRI report for this study.  BI-RADS CATEGORY 6:  Known biopsy-proven malignancy - appropriate action should be taken.   Original Report Authenticated By: [name], M.D.    ASSESSMENT: 72 y.o. [place] woman   (1)  status post right breast and right axillary lymph node biopsies February 2014 of a clinical T2, pN1, stage IIB,  invasive ductal carcinoma, grade 3, triple negative, with an MIB-1 of 15%  (2)  a third biopsy from a different quadrant 10/23/2012 showed ductal carcinoma in situ  (3)  currently being treated in the neoadjuvant setting, completed 4 dose dense cycles of doxorubicin/ cyclophosphamide 12/23/2012 to be followed by 12 weekly doses of paclitaxel and carboplatin prior to definitive surgery.   (4) family history suggestive of Lynch syndrome; genetics evaluation planned 01/09/2013  PLAN:  Debra Mcintyre completed the first half of her chemotherapy without major problems and is having a very good initial response. I am making her a cycle of year or of running, and other were she will continue to take it for the next 3 months until she is done with chemotherapy. That should take care of the mouth sores problems.  She will be started on carboplatin and paclitaxel given weekly on 01/07/2013. Next dose will be on Monday, June 2 and then we have to move back to Tuesday again because of a graduation so the third dose will be June 10 after that it will be back on Mondays and will continue on every Monday through August 11. She has a good understanding of the possible toxicities, side effects and complications and is very eager to proceed. We will  review the antinausea regimen for this chemotherapy combination at her next visit here  Montina Dorrance C    12/31/2012

## 2013-01-02 ENCOUNTER — Other Ambulatory Visit: Payer: Self-pay | Admitting: Oncology

## 2013-01-07 ENCOUNTER — Ambulatory Visit (HOSPITAL_BASED_OUTPATIENT_CLINIC_OR_DEPARTMENT_OTHER): Payer: Medicare Other

## 2013-01-07 ENCOUNTER — Ambulatory Visit (HOSPITAL_BASED_OUTPATIENT_CLINIC_OR_DEPARTMENT_OTHER): Payer: Medicare Other | Admitting: Physician Assistant

## 2013-01-07 ENCOUNTER — Encounter: Payer: Self-pay | Admitting: Physician Assistant

## 2013-01-07 ENCOUNTER — Other Ambulatory Visit: Payer: Medicare Other | Admitting: Lab

## 2013-01-07 ENCOUNTER — Other Ambulatory Visit (HOSPITAL_BASED_OUTPATIENT_CLINIC_OR_DEPARTMENT_OTHER): Payer: Medicare Other | Admitting: Lab

## 2013-01-07 ENCOUNTER — Telehealth: Payer: Self-pay | Admitting: *Deleted

## 2013-01-07 ENCOUNTER — Ambulatory Visit: Payer: Medicare Other | Admitting: Physician Assistant

## 2013-01-07 ENCOUNTER — Other Ambulatory Visit: Payer: Self-pay | Admitting: Oncology

## 2013-01-07 VITALS — BP 135/71 | HR 81 | Temp 98.6°F | Resp 20

## 2013-01-07 VITALS — BP 122/65 | HR 98 | Temp 98.1°F | Resp 20 | Ht 69.0 in | Wt 164.7 lb

## 2013-01-07 DIAGNOSIS — R599 Enlarged lymph nodes, unspecified: Secondary | ICD-10-CM

## 2013-01-07 DIAGNOSIS — D0590 Unspecified type of carcinoma in situ of unspecified breast: Secondary | ICD-10-CM

## 2013-01-07 DIAGNOSIS — C50919 Malignant neoplasm of unspecified site of unspecified female breast: Secondary | ICD-10-CM

## 2013-01-07 DIAGNOSIS — C773 Secondary and unspecified malignant neoplasm of axilla and upper limb lymph nodes: Secondary | ICD-10-CM | POA: Diagnosis not present

## 2013-01-07 DIAGNOSIS — C50111 Malignant neoplasm of central portion of right female breast: Secondary | ICD-10-CM

## 2013-01-07 DIAGNOSIS — C50119 Malignant neoplasm of central portion of unspecified female breast: Secondary | ICD-10-CM

## 2013-01-07 DIAGNOSIS — Z5111 Encounter for antineoplastic chemotherapy: Secondary | ICD-10-CM

## 2013-01-07 DIAGNOSIS — C50911 Malignant neoplasm of unspecified site of right female breast: Secondary | ICD-10-CM

## 2013-01-07 LAB — CBC WITH DIFFERENTIAL/PLATELET
BASO%: 0.7 % (ref 0.0–2.0)
HCT: 29.3 % — ABNORMAL LOW (ref 34.8–46.6)
LYMPH%: 6.8 % — ABNORMAL LOW (ref 14.0–49.7)
MCH: 30.4 pg (ref 25.1–34.0)
MCHC: 34.5 g/dL (ref 31.5–36.0)
MCV: 88.1 fL (ref 79.5–101.0)
MONO#: 1.1 10*3/uL — ABNORMAL HIGH (ref 0.1–0.9)
MONO%: 10.8 % (ref 0.0–14.0)
NEUT%: 81.6 % — ABNORMAL HIGH (ref 38.4–76.8)
Platelets: 194 10*3/uL (ref 145–400)
RBC: 3.32 10*6/uL — ABNORMAL LOW (ref 3.70–5.45)

## 2013-01-07 MED ORDER — FAMOTIDINE IN NACL 20-0.9 MG/50ML-% IV SOLN
20.0000 mg | Freq: Once | INTRAVENOUS | Status: AC
  Administered 2013-01-07: 20 mg via INTRAVENOUS

## 2013-01-07 MED ORDER — HEPARIN SOD (PORK) LOCK FLUSH 100 UNIT/ML IV SOLN
500.0000 [IU] | Freq: Once | INTRAVENOUS | Status: AC | PRN
  Administered 2013-01-07: 500 [IU]
  Filled 2013-01-07: qty 5

## 2013-01-07 MED ORDER — DIPHENHYDRAMINE HCL 50 MG/ML IJ SOLN
50.0000 mg | Freq: Once | INTRAMUSCULAR | Status: AC
  Administered 2013-01-07: 50 mg via INTRAVENOUS

## 2013-01-07 MED ORDER — SODIUM CHLORIDE 0.9 % IV SOLN
226.0000 mg | Freq: Once | INTRAVENOUS | Status: AC
  Administered 2013-01-07: 230 mg via INTRAVENOUS
  Filled 2013-01-07: qty 23

## 2013-01-07 MED ORDER — ONDANSETRON HCL 8 MG PO TABS: ORAL_TABLET | ORAL | Status: DC

## 2013-01-07 MED ORDER — SODIUM CHLORIDE 0.9 % IV SOLN
Freq: Once | INTRAVENOUS | Status: AC
  Administered 2013-01-07: 13:00:00 via INTRAVENOUS

## 2013-01-07 MED ORDER — LORAZEPAM 1 MG PO TABS
0.5000 mg | ORAL_TABLET | Freq: Once | ORAL | Status: AC
  Administered 2013-01-07: 0.5 mg via ORAL

## 2013-01-07 MED ORDER — SODIUM CHLORIDE 0.9 % IV SOLN
80.0000 mg/m2 | Freq: Once | INTRAVENOUS | Status: AC
  Administered 2013-01-07: 150 mg via INTRAVENOUS
  Filled 2013-01-07: qty 25

## 2013-01-07 MED ORDER — SODIUM CHLORIDE 0.9 % IJ SOLN
10.0000 mL | INTRAMUSCULAR | Status: DC | PRN
  Administered 2013-01-07: 10 mL
  Filled 2013-01-07: qty 10

## 2013-01-07 MED ORDER — ONDANSETRON 16 MG/50ML IVPB (CHCC)
16.0000 mg | Freq: Once | INTRAVENOUS | Status: AC
  Administered 2013-01-07: 16 mg via INTRAVENOUS

## 2013-01-07 MED ORDER — DEXAMETHASONE SODIUM PHOSPHATE 20 MG/5ML IJ SOLN
20.0000 mg | Freq: Once | INTRAMUSCULAR | Status: AC
  Administered 2013-01-07: 20 mg via INTRAVENOUS

## 2013-01-07 NOTE — Progress Notes (Signed)
ID: Debra Mcintyre   DOB: 1941-03-31  MR#: [id number]  CSN#:[id number]  PCP: [name], MD GYN: [name] SU: Debra Mcintyre OTHER MD: [name], [name], [name],   HISTORY OF PRESENT ILLNESS: Debra Mcintyre had screening mammography at [facility] 04/10/2012 raising the question of some axillary lymph nodes on the right. Additional views of the right axilla and right axillary ultrasound performed 04/04/2012 showed 3 lymph nodes that appeared larger than prior. The largest measured 1.5 cm. 2 of them had cortical thickening. Close followup was suggested, and repeat right axillary ultrasound 06/25/2012 showed no significant change in the lymph nodes in question. Followup ultrasound in 3 months was recommended, but in the interim the patient saw her primary physician, Dr. [name] and ADL, and he set her up for a chest CT on 07/08/2012, which showed a mild asymmetric density in the upper mid right breast. There was a prominent right axillary lymph node measuring 1.9 cm, with a few smaller adjacent nodes asymmetric with compared to the left side. Again, short interval followup was recommended, and on 10/07/2012 the patient had digital right mammography and ultrasonography, now at the breast Center. Dr. [name] noted pleomorphic calcifications spanning an area of 6.3 cm without associated mass. Physical exam was unremarkable. Ultrasound showed an area of adenopathy in the right axilla measuring 1.6 cm, with a second lymph node with a thickened cortex measuring 1.2 cm. No suspicious mass was seen by ultrasound in the right breast.  Biopsy of the larger axillary lymph node on 10/07/2012 showed ([id number]) an invasive ductal carcinoma, triple negative, with an MIB-1 of 66%. Biopsy of the right breast the next day, [id number]) showed invasive ductal carcinoma, grade 3. Breast MRI 10/14/2012 showed a total area of irregular enhancement in the right breast measuring up to 12 cm. MRI guided biopsy of an  area in the upper inner quadrant of the right breast on 10/23/2012 showed ([id number]) ductal carcinoma in situ, with foci worrisome for invasion.  The patient's subsequent history is as detailed below  INTERVAL HISTORY: Debra Mcintyre returns today for followup of her locally advanced right breast cancer. She completed her 4 dose dense cycles of doxorubicin/cyclophosphamide, and is ready for day 1 cycle 1 of 12 planned weekly doses of paclitaxel/carboplatin today, all given in the neoadjuvant setting.  Debra Mcintyre is feeling well today. She really has no new complaints, and feels that she has recovered well from a previous regimen. At baseline, she does have some mild tingling and numbness in her toes which we will follow very closely. She has no signs of neuropathy in the upper extremities.   REVIEW OF SYSTEMS: Debra Mcintyre has had no fevers, chills, or night sweats. She denies any rashes, abnormal bruising, or abnormal bleeding, other than some slight blood in the stool secondary to hemorrhoids. She was a stool softeners as needed. She's had no nausea or emesis. She's keeping herself well hydrated. She's had no mouth ulcers or oral sensitivity. She has some shortness of breath with exertion, but denies any cough, phlegm production, chest pain, or palpitations. She's had no abnormal headaches or dizziness. No unusual myalgias, arthralgias, bony pain, or peripheral swelling.  A detailed review of systems is otherwise noncontributory.   PAST MEDICAL HISTORY: Past Medical History  Diagnosis Date  . Hypertension   . Cerebrovascular disease, unspecified   . Cerebral aneurysm, nonruptured   . Pure hypercholesterolemia   . Hypothyroidism   . Colonic polyp   . Breast cancer 09/2012  right breast/right axillary lymph node t2,pn1, stage 11b, invasive ductal carcinma, grade 3, triple negative, with an mib-1 of 15%  . History of chemotherapy     PAST SURGICAL HISTORY: Past Surgical History  Procedure Laterality  Date  . Left middle cerebral artery angioplasty  2004    by [name] had 2 follow up occurances  . Portacath placement N/A 11/05/2012    Procedure: INSERTION PORT-A-CATH;  Surgeon: Debra Loron, MD;  Location: [facility];  Service: General;  Laterality: N/A;  . Breast biopsy Right 10/23/2012  . Breast biopsy Right 10/08/2012    FAMILY HISTORY Family History  Problem Relation Age of Onset  . Lung cancer Mother   . Heart disease Father   . Colon cancer Brother   . Alcohol abuse Brother   . Colon cancer Maternal Uncle    the patient's father died at the age of 58. The patient's mother died at the age of 75 from lung cancer. She was not a smoker. The cancer was diagnosed when she was 70 years old. The patient's mother had 2 sisters and one brother. One of those sisters had lung cancer and a brother had colon cancer, both diagnosed in their 60s. The patient herself has one brother who died from colon cancer at the age of 27. That brother has a daughter who was diagnosed with uterine cancer at the age of 31. All this raises the question of possible Lynch syndrome and I have referred the patient for genetics counseling.  GYNECOLOGIC HISTORY: Menarche age 59, menopause around 65. The patient is GX P0. She took birth control pills for many years with no complications.  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 Debra Mcintyre who lives in [place]" and is financial vice president of [employer], and Debra Mcintyre who lives in [place] and works as a Transport planner. The patient has 5 grandchildren. She is currently not a church attender   ADVANCED DIRECTIVES: Not in place; this was discussed 10/29/2012 and the patient was advised to complete a healthcare power of attorney document  HEALTH MAINTENANCE: History  Substance Use Topics  . Smoking status: Former Smoker --  1.00 packs/day for 20 years    Types: Cigarettes    Quit date: 04/02/1986  . Smokeless tobacco: Never Used  . Alcohol Use: No     Colonoscopy: 2012  PAP: 2012  Bone density:  Lipid panel:  No Known Allergies  Current Outpatient Prescriptions  Medication Sig Dispense Refill  . acyclovir (ZOVIRAX) 400 MG tablet Take 1 tablet (400 mg total) by mouth 2 (two) times daily.  60 tablet  2  . Alum & Mag Hydroxide-Simeth (MAGIC MOUTHWASH W/LIDOCAINE) SOLN Take 5 mLs by mouth 3 (three) times daily as needed.  100 mL  1  . aspirin 325 MG tablet Take 325 mg by mouth daily.        . Calcium Carbonate-Vitamin D (CALCIUM 600+D) 600-200 MG-UNIT TABS Take 1 tablet by mouth 2 (two) times daily.        . ciprofloxacin (CIPRO) 500 MG tablet Take 1 tablet (500 mg total) by mouth 2 (two) times daily.  14 tablet  3  . clopidogrel (PLAVIX) 75 MG tablet TAKE ONE TABLET BY MOUTH EVERY DAY  30 tablet  6  . dexamethasone (DECADRON) 4 MG tablet       . famotidine (PEPCID) 40 MG tablet Take  1 tablet (40 mg total) by mouth daily.  30 tablet  3  . fluconazole (DIFLUCAN) 100 MG tablet Take 1 tablet (100 mg total) by mouth daily.  7 tablet  3  . levothyroxine (SYNTHROID, LEVOTHROID) 125 MCG tablet Take 1 tablet (125 mcg total) by mouth daily.  30 tablet  11  . lidocaine-prilocaine (EMLA) cream Apply topically as needed. Apply over port 1-2 hors before chemo and cover with plastic wrap  30 g  0  . loratadine (CLARITIN) 10 MG tablet Take 10 mg by mouth daily.      Marland Kitchen LORazepam (ATIVAN) 0.5 MG tablet Take 1 tablet (0.5 mg total) by mouth at bedtime as needed for anxiety (Nausea or vomiting).  30 tablet  0  . metoprolol succinate (TOPROL-XL) 100 MG 24 hr tablet TAKE ONE TABLET BY MOUTH EVERY DAY  30 tablet  11  . Multiple Vitamin (MULTIVITAMIN) capsule Take 1 capsule by mouth daily.        . Naproxen Sodium (ALEVE) 220 MG CAPS Take 1 capsule by mouth as needed.      Marland Kitchen oxyCODONE-acetaminophen (ROXICET) 5-325 MG per tablet  Take 1 tablet by mouth every 6 (six) hours as needed for pain.  20 tablet  0  . prochlorperazine (COMPAZINE) 10 MG tablet       . rosuvastatin (CRESTOR) 20 MG tablet Take 1 tablet (20 mg total) by mouth daily.  30 tablet  11  . venlafaxine XR (EFFEXOR-XR) 75 MG 24 hr capsule Take 1 capsule (75 mg total) by mouth daily.  30 capsule  6  . lisinopril-hydrochlorothiazide (PRINZIDE) 20-12.5 MG per tablet Take 1 tablet by mouth daily.  30 tablet  11  . ondansetron (ZOFRAN) 8 MG tablet 1 tab by mouth twice daily x 3 days after chemo, then every 12 hours if needed for nausea  30 tablet  1   No current facility-administered medications for this visit.    OBJECTIVE: Middle-aged white woman in no acute distress Filed Vitals:   01/07/13 1104  BP: 122/65  Pulse: 98  Temp: 98.1 F (36.7 C)  Resp: 20     Body mass index is 24.31 kg/(m^2).    ECOG FS: 1 Filed Weights   01/07/13 1104  Weight: 164 lb 11.2 oz (74.707 kg)   Sclerae unicteric Oropharynx clear, no lesions noted, no candidiasis  No cervical or supraclavicular adenopathy; Lungs clear to auscultation bilaterally, no crackles, wheezes, or rhonchi Heart regular rate and rhythm Abdomen soft, nontender, positive bowel sounds MSK no focal spinal tenderness, no peripheral edema Neuro: nonfocal, well oriented, pleasant affect Breasts: Deferred. Axillae are benign bilaterally with no palpable adenopathy.   LAB RESULTS: Lab Results  Component Value Date   WBC 10.4* 01/07/2013   NEUTROABS 8.5* 01/07/2013   HGB 10.1* 01/07/2013   HCT 29.3* 01/07/2013   MCV 88.1 01/07/2013   PLT 194 01/07/2013      Chemistry      Component Value Date/Time   NA 138 12/31/2012 1109   NA 138 06/04/2012 1124   K 3.8 12/31/2012 1109   K 4.3 06/04/2012 1124   CL 105 12/31/2012 1109   CL 103 06/04/2012 1124   CO2 25 12/31/2012 1109   CO2 29 06/04/2012 1124   BUN 12.9 12/31/2012 1109   BUN 14 06/04/2012 1124   CREATININE 0.7 12/31/2012 1109   CREATININE 0.7  06/04/2012 1124      Component Value Date/Time   CALCIUM 9.1 12/31/2012 1109   CALCIUM 9.9 06/04/2012  1124   ALKPHOS 69 12/31/2012 1109   ALKPHOS 47 06/04/2012 1124   AST 15 12/31/2012 1109   AST 27 06/04/2012 1124   ALT 13 12/31/2012 1109   ALT 32 06/04/2012 1124   BILITOT 0.41 12/31/2012 1109   BILITOT 1.1 06/04/2012 1124       STUDIES: Mr Breast Bilateral W Wo Contrast  12/30/2012   *RADIOLOGY REPORT*  Clinical Data: Known right breast grade III invasive ductal carcinoma with lymphovascular invasion and a metastatic right axillary lymph node.  The patient underwent an MR guided core biopsy 8 cm posterior and superior to the original biopsy, which showed DCIS with foci worrisome for invasion.  BILATERAL BREAST MRI WITH AND WITHOUT CONTRAST  Technique: Multiplanar, multisequence MR images of both breasts were obtained prior to and following the intravenous administration of 15ml of multihance.  Three dimensional images were evaluated at the independent DynaCad workstation.  Comparison:  Mammograms dated 10/23/2012, 10/08/2012, 10/07/2012 and 08/20/2013and prior MRIs dated 10/14/2012 and 10/23/2012.  Findings:  Right breast:  There has been good interval response to the neoadjuvant chemotherapy.  The irregular linear and stippled enhancement throughout the entire upper central right breast is smaller and shows less enhancement.  The enhancement measures  4.0 x 11.0 x 8.0 cm (transverse x AP x CC).  On the prior MRI dated 11/11/2011 it measured 8 x 12 x 7 cm.  Two biopsy clip artifacts located 7 cm apart are seen in the breast.  Left breast:  No abnormal enhancement seen in the left breast.  There has been significant interval reduction in the size of the level I right axillary adenopathy.  Right axillary lymph nodes persist but are not pathologically enlarged.  IMPRESSION: Positive interval response to neoadjuvant chemotherapy with less enhancement in the right breast and reduction in size of the  axillary adenopathy.  RECOMMENDATION: Treatment planning.  THREE-DIMENSIONAL MR IMAGE RENDERING ON INDEPENDENT WORKSTATION:  Three-dimensional MR images were rendered by post-processing of the original MR data on an independent workstation.  The three- dimensional MR images were interpreted, and findings were reported in the accompanying complete MRI report for this study.  BI-RADS CATEGORY 6:  Known biopsy-proven malignancy - appropriate action should be taken.   Original Report Authenticated By: [name], M.D.    ASSESSMENT: 72 y.o.  woman   (1)  status post right breast and right axillary lymph node biopsies February 2014 of a clinical T2, pN1, stage IIB,  invasive ductal carcinoma, grade 3, triple negative, with an MIB-1 of 15%  (2)  a third biopsy from a different quadrant 10/23/2012 showed ductal carcinoma in situ  (3)  currently being treated in the neoadjuvant setting, completed 4 dose dense cycles of doxorubicin/ cyclophosphamide 12/23/2012 to be followed by 12 weekly doses of paclitaxel and carboplatin prior to definitive surgery.   (4) family history suggestive of Lynch syndrome; genetics evaluation planned 01/09/2013  PLAN:  Debra Mcintyre will proceed to treatment today as scheduled for day 1 cycle 1 of 12 planned weekly doses of paclitaxel/carboplatin. We reviewed possible side effects and chemotoxicities associated with these agents. We also reviewed her antinausea regimen. Specifically, she'll take dexamethasone, 8 mg twice daily for 2 days following chemotherapy. She will also take ondansetron, 8 mg twice daily for the first 3 days after chemotherapy, then every 12 hours as needed for nausea. She will also have prochlorperazine on hand to take every 6 hours as needed for nausea, and will take lorazepam at night on the days she  takes the steroids. She is given written information on how to utilize all these appropriately, and voiced her understanding of the above.  Juaquina will  typically be treated on Tuesdays. She is going to be out of town from June 3 through June 9. Accordingly, next week she will receive day 1 cycle 2 on Monday, June 2. We will then resume her Tuesday treatments the following week on June 10. All these appointments are being coordinated for her accordingly. She voices understanding and agreement with this plan, noticed called any changes or problems prior to her next scheduled appointment.   Demari Gales    01/07/2013

## 2013-01-07 NOTE — Patient Instructions (Addendum)
[facility] Discharge Instructions for Patients Receiving Chemotherapy  Today you received the following chemotherapy agents:  Taxol and Carboplatin  To help prevent nausea and vomiting after your treatment, we encourage you to take your nausea medication as ordered per MD.    If you develop nausea and vomiting that is not controlled by your nausea medication, call the clinic. If it is after clinic hours your family physician or the after hours number for the clinic or go to the Emergency Department.   BELOW ARE SYMPTOMS THAT SHOULD BE REPORTED IMMEDIATELY:  *FEVER GREATER THAN 100.5 F  *CHILLS WITH OR WITHOUT FEVER  NAUSEA AND VOMITING THAT IS NOT CONTROLLED WITH YOUR NAUSEA MEDICATION  *UNUSUAL SHORTNESS OF BREATH  *UNUSUAL BRUISING OR BLEEDING  TENDERNESS IN MOUTH AND THROAT WITH OR WITHOUT PRESENCE OF ULCERS  *URINARY PROBLEMS  *BOWEL PROBLEMS  UNUSUAL RASH Items with * indicate a potential emergency and should be followed up as soon as possible.  One of the nurses will contact you 24 hours after your treatment. Please let the nurse know about any problems that you may have experienced. Feel free to call the clinic you have any questions or concerns. The clinic phone number is [phone].   I have been informed and understand all the instructions given to me. I know to contact the clinic, my physician, or go to the Emergency Department if any problems should occur. I do not have any questions at this time, but understand that I may call the clinic during office hours   should I have any questions or need assistance in obtaining follow up care.    __________________________________________  _____________  __________ Signature of Patient or Authorized Representative            Date                   Time    __________________________________________ Nurse's Signature   Carboplatin injection What is this medicine? CARBOPLATIN (KAR boe pla tin) is a  chemotherapy drug. It targets fast dividing cells, like cancer cells, and causes these cells to die. This medicine is used to treat ovarian cancer and many other cancers. This medicine may be used for other purposes; ask your health care provider or pharmacist if you have questions. What should I tell my health care provider before I take this medicine? They need to know if you have any of these conditions: -blood disorders -hearing problems -kidney disease -recent or ongoing radiation therapy -an unusual or allergic reaction to carboplatin, cisplatin, other chemotherapy, other medicines, foods, dyes, or preservatives -pregnant or trying to get pregnant -breast-feeding How should I use this medicine? This drug is usually given as an infusion into a vein. It is administered in a hospital or clinic by a specially trained health care professional. Talk to your pediatrician regarding the use of this medicine in children. Special care may be needed. Overdosage: If you think you have taken too much of this medicine contact a poison control center or emergency room at once. NOTE: This medicine is only for you. Do not share this medicine with others. What if I miss a dose? It is important not to miss a dose. Call your doctor or health care professional if you are unable to keep an appointment. What may interact with this medicine? -medicines for seizures -medicines to increase blood counts like filgrastim, pegfilgrastim, sargramostim -some antibiotics like amikacin, gentamicin, neomycin, streptomycin, tobramycin -vaccines Talk to your doctor or health  care professional before taking any of these medicines: -acetaminophen -aspirin -ibuprofen -ketoprofen -naproxen This list may not describe all possible interactions. Give your health care provider a list of all the medicines, herbs, non-prescription drugs, or dietary supplements you use. Also tell them if you smoke, drink alcohol, or use illegal  drugs. Some items may interact with your medicine. What should I watch for while using this medicine? Your condition will be monitored carefully while you are receiving this medicine. You will need important blood work done while you are taking this medicine. This drug may make you feel generally unwell. This is not uncommon, as chemotherapy can affect healthy cells as well as cancer cells. Report any side effects. Continue your course of treatment even though you feel ill unless your doctor tells you to stop. In some cases, you may be given additional medicines to help with side effects. Follow all directions for their use. Call your doctor or health care professional for advice if you get a fever, chills or sore throat, or other symptoms of a cold or flu. Do not treat yourself. This drug decreases your body's ability to fight infections. Try to avoid being around people who are sick. This medicine may increase your risk to bruise or bleed. Call your doctor or health care professional if you notice any unusual bleeding. Be careful brushing and flossing your teeth or using a toothpick because you may get an infection or bleed more easily. If you have any dental work done, tell your dentist you are receiving this medicine. Avoid taking products that contain aspirin, acetaminophen, ibuprofen, naproxen, or ketoprofen unless instructed by your doctor. These medicines may hide a fever. Do not become pregnant while taking this medicine. Women should inform their doctor if they wish to become pregnant or think they might be pregnant. There is a potential for serious side effects to an unborn child. Talk to your health care professional or pharmacist for more information. Do not breast-feed an infant while taking this medicine. What side effects may I notice from receiving this medicine? Side effects that you should report to your doctor or health care professional as soon as possible: -allergic reactions like  skin rash, itching or hives, swelling of the face, lips, or tongue -signs of infection - fever or chills, cough, sore throat, pain or difficulty passing urine -signs of decreased platelets or bleeding - bruising, pinpoint red spots on the skin, black, tarry stools, nosebleeds -signs of decreased red blood cells - unusually weak or tired, fainting spells, lightheadedness -breathing problems -changes in hearing -changes in vision -chest pain -high blood pressure -low blood counts - This drug may decrease the number of white blood cells, red blood cells and platelets. You may be at increased risk for infections and bleeding. -nausea and vomiting -pain, swelling, redness or irritation at the injection site -pain, tingling, numbness in the hands or feet -problems with balance, talking, walking -trouble passing urine or change in the amount of urine Side effects that usually do not require medical attention (report to your doctor or health care professional if they continue or are bothersome): -hair loss -loss of appetite -metallic taste in the mouth or changes in taste This list may not describe all possible side effects. Call your doctor for medical advice about side effects. You may report side effects to FDA at 1-800-FDA-1088. Where should I keep my medicine? This drug is given in a hospital or clinic and will not be stored at home. NOTE: This sheet  is a summary. It may not cover all possible information. If you have questions about this medicine, talk to your doctor, pharmacist, or health care provider.  2012, Elsevier/Gold Standard. (11/05/2007 2:38:05 PM)Paclitaxel injection What is this medicine? PACLITAXEL (PAK li TAX el) is a chemotherapy drug. It targets fast dividing cells, like cancer cells, and causes these cells to die. This medicine is used to treat ovarian cancer, breast cancer, and other cancers. This medicine may be used for other purposes; ask your health care provider or  pharmacist if you have questions. What should I tell my health care provider before I take this medicine? They need to know if you have any of these conditions: -blood disorders -irregular heartbeat -infection (especially a virus infection such as chickenpox, cold sores, or herpes) -liver disease -previous or ongoing radiation therapy -an unusual or allergic reaction to paclitaxel, alcohol, polyoxyethylated castor oil, other chemotherapy agents, other medicines, foods, dyes, or preservatives -pregnant or trying to get pregnant -breast-feeding How should I use this medicine? This drug is given as an infusion into a vein. It is administered in a hospital or clinic by a specially trained health care professional. Talk to your pediatrician regarding the use of this medicine in children. Special care may be needed. Overdosage: If you think you have taken too much of this medicine contact a poison control center or emergency room at once. NOTE: This medicine is only for you. Do not share this medicine with others. What if I miss a dose? It is important not to miss your dose. Call your doctor or health care professional if you are unable to keep an appointment. What may interact with this medicine? Do not take this medicine with any of the following medications: -disulfiram -metronidazole This medicine may also interact with the following medications: -cyclosporine -dexamethasone -diazepam -ketoconazole -medicines to increase blood counts like filgrastim, pegfilgrastim, sargramostim -other chemotherapy drugs like cisplatin, doxorubicin, epirubicin, etoposide, teniposide, vincristine -quinidine -testosterone -vaccines -verapamil Talk to your doctor or health care professional before taking any of these medicines: -acetaminophen -aspirin -ibuprofen -ketoprofen -naproxen This list may not describe all possible interactions. Give your health care provider a list of all the medicines,  herbs, non-prescription drugs, or dietary supplements you use. Also tell them if you smoke, drink alcohol, or use illegal drugs. Some items may interact with your medicine. What should I watch for while using this medicine? Your condition will be monitored carefully while you are receiving this medicine. You will need important blood work done while you are taking this medicine. This drug may make you feel generally unwell. This is not uncommon, as chemotherapy can affect healthy cells as well as cancer cells. Report any side effects. Continue your course of treatment even though you feel ill unless your doctor tells you to stop. In some cases, you may be given additional medicines to help with side effects. Follow all directions for their use. Call your doctor or health care professional for advice if you get a fever, chills or sore throat, or other symptoms of a cold or flu. Do not treat yourself. This drug decreases your body's ability to fight infections. Try to avoid being around people who are sick. This medicine may increase your risk to bruise or bleed. Call your doctor or health care professional if you notice any unusual bleeding. Be careful brushing and flossing your teeth or using a toothpick because you may get an infection or bleed more easily. If you have any dental work done, tell  your dentist you are receiving this medicine. Avoid taking products that contain aspirin, acetaminophen, ibuprofen, naproxen, or ketoprofen unless instructed by your doctor. These medicines may hide a fever. Do not become pregnant while taking this medicine. Women should inform their doctor if they wish to become pregnant or think they might be pregnant. There is a potential for serious side effects to an unborn child. Talk to your health care professional or pharmacist for more information. Do not breast-feed an infant while taking this medicine. Men are advised not to father a child while receiving this  medicine. What side effects may I notice from receiving this medicine? Side effects that you should report to your doctor or health care professional as soon as possible: -allergic reactions like skin rash, itching or hives, swelling of the face, lips, or tongue -low blood counts - This drug may decrease the number of white blood cells, red blood cells and platelets. You may be at increased risk for infections and bleeding. -signs of infection - fever or chills, cough, sore throat, pain or difficulty passing urine -signs of decreased platelets or bleeding - bruising, pinpoint red spots on the skin, black, tarry stools, nosebleeds -signs of decreased red blood cells - unusually weak or tired, fainting spells, lightheadedness -breathing problems -chest pain -high or low blood pressure -mouth sores -nausea and vomiting -pain, swelling, redness or irritation at the injection site -pain, tingling, numbness in the hands or feet -slow or irregular heartbeat -swelling of the ankle, feet, hands Side effects that usually do not require medical attention (report to your doctor or health care professional if they continue or are bothersome): -bone pain -complete hair loss including hair on your head, underarms, pubic hair, eyebrows, and eyelashes -changes in the color of fingernails -diarrhea -loosening of the fingernails -loss of appetite -muscle or joint pain -red flush to skin -sweating This list may not describe all possible side effects. Call your doctor for medical advice about side effects. You may report side effects to FDA at 1-800-FDA-1088. Where should I keep my medicine? This drug is given in a hospital or clinic and will not be stored at home. NOTE: This sheet is a summary. It may not cover all possible information. If you have questions about this medicine, talk to your doctor, pharmacist, or health care provider.  2013, Elsevier/Gold Standard. (07/13/2008 11:54:26 AM)

## 2013-01-07 NOTE — Telephone Encounter (Signed)
appts made and printed. i also emailed MW to move tx from 01/14/13 to 01/13/13. Pt is aware that tx will follow after her ov on 01/13/13...td

## 2013-01-07 NOTE — Telephone Encounter (Signed)
Per staff message and POF I have scheduled appts.  JMW  

## 2013-01-08 ENCOUNTER — Ambulatory Visit: Payer: Medicare Other | Admitting: Physician Assistant

## 2013-01-08 ENCOUNTER — Other Ambulatory Visit: Payer: Medicare Other | Admitting: Lab

## 2013-01-08 ENCOUNTER — Telehealth: Payer: Self-pay | Admitting: *Deleted

## 2013-01-08 ENCOUNTER — Ambulatory Visit: Payer: Medicare Other

## 2013-01-08 NOTE — Telephone Encounter (Signed)
[name] says she is doing well.  Denies any side effects or symptoms at this time.  Eating and drinking well.  No complaints.  Asked if her June 10th appointments can be changed to the afternoon.  She will be arriving home in the wee hours of the morning after traveling to her granddaughter's graduation.  Will notify schedulers.

## 2013-01-08 NOTE — Telephone Encounter (Signed)
Message copied by Augusto Garbe on Wed Jan 08, 2013 11:33 AM ------      Message from: O'KELLEY, WENDY P      Created: Wed Jan 08, 2013  8:51 AM      Regarding: Chemo follow up call        Patient with first Taxol and Carboplatin status post adriamycin and cytoxan.  Felt a little dizzy with benadryl and was given Ativan for restless leg but tolerated treatment well.              Thanks      Toniann Fail ------

## 2013-01-09 ENCOUNTER — Other Ambulatory Visit: Payer: Medicare Other | Admitting: Lab

## 2013-01-09 ENCOUNTER — Telehealth: Payer: Self-pay | Admitting: Oncology

## 2013-01-09 ENCOUNTER — Encounter: Payer: Self-pay | Admitting: Genetic Counselor

## 2013-01-09 ENCOUNTER — Ambulatory Visit (HOSPITAL_BASED_OUTPATIENT_CLINIC_OR_DEPARTMENT_OTHER): Payer: Medicare Other | Admitting: Genetic Counselor

## 2013-01-09 DIAGNOSIS — C50911 Malignant neoplasm of unspecified site of right female breast: Secondary | ICD-10-CM

## 2013-01-09 DIAGNOSIS — C50919 Malignant neoplasm of unspecified site of unspecified female breast: Secondary | ICD-10-CM

## 2013-01-09 NOTE — Progress Notes (Signed)
Dr.  Raymond Gurney Magrinat requested a consultation for genetic counseling and risk assessment for Debra Mcintyre, a 72 y.o. female, for discussion of her personal history of breast cancer and family history of colon, lung and ovarian cancer. She presents to clinic today to discuss the possibility of a genetic predisposition to cancer, and to further clarify her risks, as well as her family members' risks for cancer.   HISTORY OF PRESENT ILLNESS: In 2014, at the age of 49, Debra Mcintyre was diagnosed with invasive ductal carcinoma of the right breast. This was treated with chemotherapy, and she is planning on going through a right sided mastectomy and radiation.  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  The patient has had a colonoscopy and a total of 2 lifetime polyps were found.    Past Medical History  Diagnosis Date  . Hypertension   . Cerebrovascular disease, unspecified   . Cerebral aneurysm, nonruptured   . Pure hypercholesterolemia   . Hypothyroidism   . Colonic polyp   . Breast cancer 09/2012    right breast/right axillary lymph node t2,pn1, stage 11b, invasive ductal carcinma, grade 3, triple negative, with an mib-1 of 15%  . History of chemotherapy     Past Surgical History  Procedure Laterality Date  . Left middle cerebral artery angioplasty  2004    by [name] had 2 follow up occurances  . Portacath placement N/A 11/05/2012    Procedure: INSERTION PORT-A-CATH;  Surgeon: [name], MD;  Location: [facility];  Service: General;  Laterality: N/A;  . Breast biopsy Right 10/23/2012  . Breast biopsy Right 10/08/2012    History  Substance Use Topics  . Smoking status: Former Smoker -- 1.00 packs/day for 20 years    Types: Cigarettes    Quit date: 04/02/1986  . Smokeless tobacco: Never Used  . Alcohol Use: No    REPRODUCTIVE HISTORY AND PERSONAL RISK ASSESSMENT FACTORS: Menarche was at age 55.   Menopause in her 16s Uterus Intact: Yes Ovaries Intact:  Yes [id number], first live birth at age N/A  She has not previously undergone treatment for infertility.   OCP use for 20-25 years   She has not used HRT in the past.    FAMILY HISTORY:  We obtained a detailed, 4-generation family history.  Significant diagnoses are listed below: Family History  Problem Relation Age of Onset  . Lung cancer Mother 83  . Heart disease Father   . Colon cancer Brother 68  . Alcohol abuse Brother   . Colon cancer Maternal Uncle     dx in his 22s  . Cancer Maternal Aunt     unknown type  . Ovarian cancer Other 30  The patient was diagnosed at 36 with breast cancer.  Her brother was diagnosed with colon cancer at 22 and his daughter was diagnosed with ovarian cancer at age 80.  This niece is currently undergoing genetic testing in [place], [place].  The patient's mother was a non-smoker and was diagnosed with lung cancer at 65.  Her mother had two sisters and a brother.  The brother had colon cancer in his 44s and one sister possibly also had lung cancer.  There is no other family history of cancer.  Patient's maternal ancestors are of [place] descent, and paternal ancestors are of [place] descent. There is no reported Ashkenazi Jewish ancestry. There is no known consanguinity.   GENETIC COUNSELING ASSESSMENT: Debra Mcintyre is a 72 y.o. female with a personal history  of triple negative breast cancer and a family history of colon, lung and ovarian cancer which somewhat suggestive of a hereditary cancer syndrome and predisposition to cancer. We, therefore, discussed and recommended the following at today's visit.   DISCUSSION: We reviewed the characteristics, features and inheritance patterns of hereditary cancer syndromes. We also discussed genetic testing, including the appropriate family members to test, the process of testing, insurance coverage and turn-around-time for results. Based on the early onset of ovarian cancer in the patient's niece, we recommended that  we wait to see what the genetic test results reveal.  Individuals with ovarian cancer have a higher chance of testing positive for a genetic mutation, and since there is colon cancer in the family which could be associated with Lynch syndrome, as well as triple negative breast cancer which could be associated with HBOC, we recommended [name] pursue genetic testing, either for the mutation that may be found in her niece, or have her own panel testing if the niece is negative for a comprehensive Cancer panel at [facility].   PLAN: After considering the risks, benefits, and limitations, Debra Mcintyre decided to wait to hear about her niece's genetic test results before pursuing genetic testing.  [name]'s questions were answered to her satisfaction today. Our contact information was provided should additional questions or concerns arise.  She understands that once her nieces test results are back, that she should call in order to help Debra Mcintyre inform the type of testing she may want to pursue.  The patient was seen for a total of 60 minutes, greater than 50% of which was spent face-to-face counseling.  This plan is being carried out per Dr. Feliz Beam recommendations.  This note will also be sent to the referring provider via the electronic medical record. The patient will be supplied with a summary of this genetic counseling discussion as well as educational information on the discussed hereditary cancer syndromes following the conclusion of their visit.   Patient was discussed with Dr. Drue Second.   _______________________________________________________________________ For Office Staff:  Number of people involved in session: 1 Was an Intern/ student involved with case: no

## 2013-01-13 ENCOUNTER — Ambulatory Visit: Payer: Medicare Other | Admitting: Physician Assistant

## 2013-01-13 ENCOUNTER — Ambulatory Visit (HOSPITAL_BASED_OUTPATIENT_CLINIC_OR_DEPARTMENT_OTHER): Payer: Medicare Other | Admitting: Oncology

## 2013-01-13 ENCOUNTER — Other Ambulatory Visit (HOSPITAL_BASED_OUTPATIENT_CLINIC_OR_DEPARTMENT_OTHER): Payer: Medicare Other | Admitting: Lab

## 2013-01-13 ENCOUNTER — Ambulatory Visit (HOSPITAL_BASED_OUTPATIENT_CLINIC_OR_DEPARTMENT_OTHER): Payer: Medicare Other

## 2013-01-13 ENCOUNTER — Other Ambulatory Visit: Payer: Medicare Other | Admitting: Lab

## 2013-01-13 VITALS — BP 109/66 | HR 109 | Temp 98.6°F | Resp 20 | Ht 69.0 in | Wt 163.7 lb

## 2013-01-13 DIAGNOSIS — C50911 Malignant neoplasm of unspecified site of right female breast: Secondary | ICD-10-CM

## 2013-01-13 DIAGNOSIS — C50111 Malignant neoplasm of central portion of right female breast: Secondary | ICD-10-CM

## 2013-01-13 DIAGNOSIS — C773 Secondary and unspecified malignant neoplasm of axilla and upper limb lymph nodes: Secondary | ICD-10-CM

## 2013-01-13 DIAGNOSIS — C50919 Malignant neoplasm of unspecified site of unspecified female breast: Secondary | ICD-10-CM

## 2013-01-13 DIAGNOSIS — R599 Enlarged lymph nodes, unspecified: Secondary | ICD-10-CM

## 2013-01-13 DIAGNOSIS — Z5111 Encounter for antineoplastic chemotherapy: Secondary | ICD-10-CM | POA: Diagnosis not present

## 2013-01-13 DIAGNOSIS — C4320 Malignant melanoma of unspecified ear and external auricular canal: Secondary | ICD-10-CM | POA: Diagnosis not present

## 2013-01-13 DIAGNOSIS — C50219 Malignant neoplasm of upper-inner quadrant of unspecified female breast: Secondary | ICD-10-CM | POA: Diagnosis not present

## 2013-01-13 LAB — COMPREHENSIVE METABOLIC PANEL (CC13)
ALT: 20 U/L (ref 0–55)
AST: 16 U/L (ref 5–34)
Albumin: 3.4 g/dL — ABNORMAL LOW (ref 3.5–5.0)
BUN: 18.5 mg/dL (ref 7.0–26.0)
CO2: 24 mEq/L (ref 22–29)
Calcium: 8.9 mg/dL (ref 8.4–10.4)
Chloride: 106 mEq/L (ref 98–107)
Potassium: 3.7 mEq/L (ref 3.5–5.1)

## 2013-01-13 LAB — CBC WITH DIFFERENTIAL/PLATELET
BASO%: 2.4 % — ABNORMAL HIGH (ref 0.0–2.0)
Basophils Absolute: 0.1 10*3/uL (ref 0.0–0.1)
EOS%: 0.3 % (ref 0.0–7.0)
Eosinophils Absolute: 0 10*3/uL (ref 0.0–0.5)
HCT: 29.7 % — ABNORMAL LOW (ref 34.8–46.6)
HGB: 10 g/dL — ABNORMAL LOW (ref 11.6–15.9)
LYMPH%: 19.9 % (ref 14.0–49.7)
MCH: 29.8 pg (ref 25.1–34.0)
MCHC: 33.7 g/dL (ref 31.5–36.0)
MCV: 88.4 fL (ref 79.5–101.0)
MONO#: 0.2 10*3/uL (ref 0.1–0.9)
MONO%: 7.3 % (ref 0.0–14.0)
NEUT#: 2 10*3/uL (ref 1.5–6.5)
NEUT%: 70.1 % (ref 38.4–76.8)
Platelets: 154 10*3/uL (ref 145–400)
RBC: 3.36 10*6/uL — ABNORMAL LOW (ref 3.70–5.45)
RDW: 17.8 % — ABNORMAL HIGH (ref 11.2–14.5)
WBC: 2.9 10*3/uL — ABNORMAL LOW (ref 3.9–10.3)
lymph#: 0.6 10*3/uL — ABNORMAL LOW (ref 0.9–3.3)

## 2013-01-13 MED ORDER — SODIUM CHLORIDE 0.9 % IV SOLN
226.0000 mg | Freq: Once | INTRAVENOUS | Status: AC
  Administered 2013-01-13: 230 mg via INTRAVENOUS
  Filled 2013-01-13: qty 23

## 2013-01-13 MED ORDER — LORAZEPAM 1 MG PO TABS
0.5000 mg | ORAL_TABLET | Freq: Once | ORAL | Status: AC
  Administered 2013-01-13: 0.5 mg via SUBLINGUAL

## 2013-01-13 MED ORDER — HEPARIN SOD (PORK) LOCK FLUSH 100 UNIT/ML IV SOLN
500.0000 [IU] | Freq: Once | INTRAVENOUS | Status: AC | PRN
  Administered 2013-01-13: 500 [IU]
  Filled 2013-01-13: qty 5

## 2013-01-13 MED ORDER — FAMOTIDINE IN NACL 20-0.9 MG/50ML-% IV SOLN
20.0000 mg | Freq: Once | INTRAVENOUS | Status: AC
  Administered 2013-01-13: 20 mg via INTRAVENOUS

## 2013-01-13 MED ORDER — SODIUM CHLORIDE 0.9 % IJ SOLN
10.0000 mL | INTRAMUSCULAR | Status: DC | PRN
  Administered 2013-01-13: 10 mL
  Filled 2013-01-13: qty 10

## 2013-01-13 MED ORDER — OXYCODONE-ACETAMINOPHEN 5-325 MG PO TABS: 1.0000 | ORAL_TABLET | Freq: Four times a day (QID) | ORAL | Status: DC | PRN

## 2013-01-13 MED ORDER — ONDANSETRON 16 MG/50ML IVPB (CHCC)
16.0000 mg | Freq: Once | INTRAVENOUS | Status: AC
  Administered 2013-01-13: 16 mg via INTRAVENOUS

## 2013-01-13 MED ORDER — LORAZEPAM 0.5 MG PO TABS: 0.5000 mg | ORAL_TABLET | Freq: Every evening | ORAL | Status: DC | PRN

## 2013-01-13 MED ORDER — SODIUM CHLORIDE 0.9 % IV SOLN
Freq: Once | INTRAVENOUS | Status: AC
  Administered 2013-01-13: 09:00:00 via INTRAVENOUS

## 2013-01-13 MED ORDER — DIPHENHYDRAMINE HCL 50 MG/ML IJ SOLN
50.0000 mg | Freq: Once | INTRAMUSCULAR | Status: AC
  Administered 2013-01-13: 50 mg via INTRAVENOUS

## 2013-01-13 MED ORDER — SODIUM CHLORIDE 0.9 % IV SOLN
80.0000 mg/m2 | Freq: Once | INTRAVENOUS | Status: AC
  Administered 2013-01-13: 150 mg via INTRAVENOUS
  Filled 2013-01-13: qty 25

## 2013-01-13 MED ORDER — DEXAMETHASONE SODIUM PHOSPHATE 20 MG/5ML IJ SOLN
20.0000 mg | Freq: Once | INTRAMUSCULAR | Status: AC
  Administered 2013-01-13: 20 mg via INTRAVENOUS

## 2013-01-13 NOTE — Progress Notes (Signed)
ID: [name]   DOB: 01-24-1941  MR#: [id number]  CSN#:[id number]  PCP: [name], MD GYN: [name], [name], [name],   HISTORY OF PRESENT ILLNESS: Mikyah had screening mammography at [facility] 04/10/2012 raising the question of some axillary lymph nodes on the right. Additional views of the right axilla and right axillary ultrasound performed 04/04/2012 showed 3 lymph nodes that appeared larger than prior. The largest measured 1.5 cm. 2 of them had cortical thickening. Close followup was suggested, and repeat right axillary ultrasound 06/25/2012 showed no significant change in the lymph nodes in question. Followup ultrasound in 3 months was recommended, but in the interim the patient saw her primary physician, Dr. [name] and ADL, and he set her up for a chest CT on 07/08/2012, which showed a mild asymmetric density in the upper mid right breast. There was a prominent right axillary lymph node measuring 1.9 cm, with a few smaller adjacent nodes asymmetric with compared to the left side. Again, short interval followup was recommended, and on 10/07/2012 the patient had digital right mammography and ultrasonography, now at the breast Center. Dr. [name] noted pleomorphic calcifications spanning an area of 6.3 cm without associated mass. Physical exam was unremarkable. Ultrasound showed an area of adenopathy in the right axilla measuring 1.6 cm, with a second lymph node with a thickened cortex measuring 1.2 cm. No suspicious mass was seen by ultrasound in the right breast.  Biopsy of the larger axillary lymph node on 10/07/2012 showed ([id number]) an invasive ductal carcinoma, triple negative, with an MIB-1 of 66%. Biopsy of the right breast the next day, [id number]) showed invasive ductal carcinoma, grade 3. Breast MRI 10/14/2012 showed a total area of irregular enhancement in the right breast measuring up to 12 cm. MRI guided biopsy of an  area in the upper inner quadrant of the right breast on 10/23/2012 showed ([id number]) ductal carcinoma in situ, with foci worrisome for invasion.  The patient's subsequent history is as detailed below  INTERVAL HISTORY: Zyniah returns today for followup of her locally advanced right breast cancer. Today is day 1 cycle 2 of 12 planned weekly carboplatin/paclitaxel doses   REVIEW OF SYSTEMS: Davida felt more tired than she expected after the first dose of this chemotherapy. She also had more bone pain, particularly in her legs. She is taking some oxycodone with APAP for that. She's also had "restless legs" at night and she is using lorazepam just about every night currently. She's had increasing problems with GERD and some difficulty swallowing, but no mouth sores. She is taking Pepcid for that. His been no vomiting no nausea and minimal numbness in her toe tips, nothing at all in her fingers. She can feel short of breath when walking up stairs, but no cough or pleurisy. A detailed review of systems was otherwise noncontributory  PAST MEDICAL HISTORY: Past Medical History  Diagnosis Date  . Hypertension   . Cerebrovascular disease, unspecified   . Cerebral aneurysm, nonruptured   . Pure hypercholesterolemia   . Hypothyroidism   . Colonic polyp   . Breast cancer 09/2012    right breast/right axillary lymph node t2,pn1, stage 11b, invasive ductal carcinma, grade 3, triple negative, with an mib-1 of 15%  . History of chemotherapy     PAST SURGICAL HISTORY: Past Surgical History  Procedure Laterality Date  . Left middle cerebral artery angioplasty  2004    by [name] had 2 follow up occurances  .  Portacath placement N/A 11/05/2012    Procedure: INSERTION PORT-A-CATH;  Surgeon: [name], MD;  Location: [facility];  Service: General;  Laterality: N/A;  . Breast biopsy Right 10/23/2012  . Breast biopsy Right 10/08/2012    FAMILY HISTORY Family History  Problem  Relation Age of Onset  . Lung cancer Mother 26  . Heart disease Father   . Colon cancer Brother 25  . Alcohol abuse Brother   . Colon cancer Maternal Uncle     dx in his 57s  . Cancer Maternal Aunt     unknown type  . Ovarian cancer Other 13   the patient's father died at the age of 40. The patient's mother died at the age of 3 from lung cancer. She was not a smoker. The cancer was diagnosed when she was 70 years old. The patient's mother had 2 sisters and one brother. One of those sisters had lung cancer and a brother had colon cancer, both diagnosed in their 32s. The patient herself has one brother who died from colon cancer at the age of 81. That brother has a daughter who was diagnosed with uterine cancer at the age of 14. All this raises the question of possible Lynch syndrome and I have referred the patient for genetics counseling.  GYNECOLOGIC HISTORY: Menarche age 14, menopause around 74. The patient is GX P0. She took birth control pills for many years with no complications.  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 [name] who lives in "[place]" and is financial vice president of [employer], and Stacy[place] who lives in [place] and works as a Transport planner. The patient has 5 grandchildren. She is currently not a church attender   ADVANCED DIRECTIVES: Not in place; this was discussed 10/29/2012 and the patient was advised to complete a healthcare power of attorney document  HEALTH MAINTENANCE: History  Substance Use Topics  . Smoking status: Former Smoker -- 1.00 packs/day for 20 years    Types: Cigarettes    Quit date: 04/02/1986  . Smokeless tobacco: Never Used  . Alcohol Use: No     Colonoscopy: 2012  PAP: 2012  Bone density:  Lipid panel:  No Known Allergies  Current Outpatient Prescriptions  Medication Sig Dispense Refill  . acyclovir  (ZOVIRAX) 400 MG tablet Take 1 tablet (400 mg total) by mouth 2 (two) times daily.  60 tablet  2  . Alum & Mag Hydroxide-Simeth (MAGIC MOUTHWASH W/LIDOCAINE) SOLN Take 5 mLs by mouth 3 (three) times daily as needed.  100 mL  1  . aspirin 325 MG tablet Take 325 mg by mouth daily.        . Calcium Carbonate-Vitamin D (CALCIUM 600+D) 600-200 MG-UNIT TABS Take 1 tablet by mouth 2 (two) times daily.        . ciprofloxacin (CIPRO) 500 MG tablet Take 1 tablet (500 mg total) by mouth 2 (two) times daily.  14 tablet  3  . clopidogrel (PLAVIX) 75 MG tablet TAKE ONE TABLET BY MOUTH EVERY DAY  30 tablet  6  . dexamethasone (DECADRON) 4 MG tablet       . famotidine (PEPCID) 40 MG tablet Take 1 tablet (40 mg total) by mouth daily.  30 tablet  3  . fluconazole (DIFLUCAN) 100 MG tablet Take 1 tablet (100 mg total) by mouth daily.  7 tablet  3  .  levothyroxine (SYNTHROID, LEVOTHROID) 125 MCG tablet Take 1 tablet (125 mcg total) by mouth daily.  30 tablet  11  . lidocaine-prilocaine (EMLA) cream Apply topically as needed. Apply over port 1-2 hors before chemo and cover with plastic wrap  30 g  0  . lisinopril-hydrochlorothiazide (PRINZIDE) 20-12.5 MG per tablet Take 1 tablet by mouth daily.  30 tablet  11  . loratadine (CLARITIN) 10 MG tablet Take 10 mg by mouth daily.      Marland Kitchen LORazepam (ATIVAN) 0.5 MG tablet Take 1 tablet (0.5 mg total) by mouth at bedtime as needed for anxiety (Nausea or vomiting).  30 tablet  0  . metoprolol succinate (TOPROL-XL) 100 MG 24 hr tablet TAKE ONE TABLET BY MOUTH EVERY DAY  30 tablet  11  . Multiple Vitamin (MULTIVITAMIN) capsule Take 1 capsule by mouth daily.        . Naproxen Sodium (ALEVE) 220 MG CAPS Take 1 capsule by mouth as needed.      . ondansetron (ZOFRAN) 8 MG tablet 1 tab by mouth twice daily x 3 days after chemo, then every 12 hours if needed for nausea  30 tablet  1  . oxyCODONE-acetaminophen (ROXICET) 5-325 MG per tablet Take 1 tablet by mouth every 6 (six) hours as  needed for pain.  60 tablet  0  . prochlorperazine (COMPAZINE) 10 MG tablet       . rosuvastatin (CRESTOR) 20 MG tablet Take 1 tablet (20 mg total) by mouth daily.  30 tablet  11  . venlafaxine XR (EFFEXOR-XR) 75 MG 24 hr capsule Take 1 capsule (75 mg total) by mouth daily.  30 capsule  6   No current facility-administered medications for this visit.    OBJECTIVE: Middle-aged white woman who appears moderately fatigued Filed Vitals:   01/13/13 0841  BP: 109/66  Pulse: 109  Temp: 98.6 F (37 C)  Resp: 20     Body mass index is 24.16 kg/(m^2).    ECOG FS: 1 Filed Weights   01/13/13 0841  Weight: 163 lb 11.2 oz (74.254 kg)   Sclerae unicteric Oropharynx clear No cervical or supraclavicular adenopathy; Lungs clear to auscultation bilaterally, no crackles, wheezes, or rhonchi Heart regular rate and rhythm Abdomen soft, nontender, positive bowel sounds MSK no focal spinal tenderness, no peripheral edema Neuro: nonfocal, well oriented, pleasant affect Breasts: Deferred.    LAB RESULTS: Lab Results  Component Value Date   WBC 2.9* 01/13/2013   NEUTROABS 2.0 01/13/2013   HGB 10.0* 01/13/2013   HCT 29.7* 01/13/2013   MCV 88.4 01/13/2013   PLT 154 01/13/2013      Chemistry      Component Value Date/Time   NA 138 12/31/2012 1109   NA 138 06/04/2012 1124   K 3.8 12/31/2012 1109   K 4.3 06/04/2012 1124   CL 105 12/31/2012 1109   CL 103 06/04/2012 1124   CO2 25 12/31/2012 1109   CO2 29 06/04/2012 1124   BUN 12.9 12/31/2012 1109   BUN 14 06/04/2012 1124   CREATININE 0.7 12/31/2012 1109   CREATININE 0.7 06/04/2012 1124      Component Value Date/Time   CALCIUM 9.1 12/31/2012 1109   CALCIUM 9.9 06/04/2012 1124   ALKPHOS 69 12/31/2012 1109   ALKPHOS 47 06/04/2012 1124   AST 15 12/31/2012 1109   AST 27 06/04/2012 1124   ALT 13 12/31/2012 1109   ALT 32 06/04/2012 1124   BILITOT 0.41 12/31/2012 1109   BILITOT 1.1 06/04/2012 1124  STUDIES: Mr Breast Bilateral W Wo  Contrast  12/30/2012   *RADIOLOGY REPORT*  Clinical Data: Known right breast grade III invasive ductal carcinoma with lymphovascular invasion and a metastatic right axillary lymph node.  The patient underwent an MR guided core biopsy 8 cm posterior and superior to the original biopsy, which showed DCIS with foci worrisome for invasion.  BILATERAL BREAST MRI WITH AND WITHOUT CONTRAST  Technique: Multiplanar, multisequence MR images of both breasts were obtained prior to and following the intravenous administration of 15ml of multihance.  Three dimensional images were evaluated at the independent DynaCad workstation.  Comparison:  Mammograms dated 10/23/2012, 10/08/2012, 10/07/2012 and 08/20/2013and prior MRIs dated 10/14/2012 and 10/23/2012.  Findings:  Right breast:  There has been good interval response to the neoadjuvant chemotherapy.  The irregular linear and stippled enhancement throughout the entire upper central right breast is smaller and shows less enhancement.  The enhancement measures  4.0 x 11.0 x 8.0 cm (transverse x AP x CC).  On the prior MRI dated 11/11/2011 it measured 8 x 12 x 7 cm.  Two biopsy clip artifacts located 7 cm apart are seen in the breast.  Left breast:  No abnormal enhancement seen in the left breast.  There has been significant interval reduction in the size of the level I right axillary adenopathy.  Right axillary lymph nodes persist but are not pathologically enlarged.  IMPRESSION: Positive interval response to neoadjuvant chemotherapy with less enhancement in the right breast and reduction in size of the axillary adenopathy.  RECOMMENDATION: Treatment planning.  THREE-DIMENSIONAL MR IMAGE RENDERING ON INDEPENDENT WORKSTATION:  Three-dimensional MR images were rendered by post-processing of the original MR data on an independent workstation.  The three- dimensional MR images were interpreted, and findings were reported in the accompanying complete MRI report for this study.  BI-RADS  CATEGORY 6:  Known biopsy-proven malignancy - appropriate action should be taken.   Original Report Authenticated By: [name], M.D.    ASSESSMENT: 72 y.o. [place] woman   (1)  status post right breast and right axillary lymph node biopsies February 2014 of a clinical T2, pN1, stage IIB,  invasive ductal carcinoma, grade 3, triple negative, with an MIB-1 of 15%  (2)  a third biopsy from a different quadrant 10/23/2012 showed ductal carcinoma in situ  (3)  currently being treated in the neoadjuvant setting, completed 4 dose dense cycles of doxorubicin/ cyclophosphamide 12/23/2012, followed by 12 weekly doses of paclitaxel and carboplatin started 09/20/2012  (4) family history suggestive of Lynch syndrome; genetics evaluation planned 01/09/2013  PLAN:  Jenissa will proceed to treatment today as scheduled for day 1 cycle 2 of 12 planned weekly doses of paclitaxel/ carboplatin. Making no changes in her doses. I refilled her oxycodone/APAP and lorazepam today. She is going to her graduation out-of-town tomorrow, but I encouraged her to call us if there are any problems. She is a ready scheduled to see [place] again next Tuesday for the third of her 12 planned cycles. When she completes her chemotherapy will repeat an MRI and proceed to surgery.  Ehan Freas C    01/13/2013

## 2013-01-13 NOTE — Patient Instructions (Addendum)
 Cancer Center Discharge Instructions for Patients Receiving Chemotherapy  Today you received the following chemotherapy agents:  Taxol and Carboplatin  To help prevent nausea and vomiting after your treatment, we encourage you to take your nausea medication as ordered per MD. Begin taking it    If you develop nausea and vomiting that is not controlled by your nausea medication, call the clinic. If it is after clinic hours your family physician or the after hours number for the clinic or go to the Emergency Department.   BELOW ARE SYMPTOMS THAT SHOULD BE REPORTED IMMEDIATELY:  *FEVER GREATER THAN 100.5 F  *CHILLS WITH OR WITHOUT FEVER  NAUSEA AND VOMITING THAT IS NOT CONTROLLED WITH YOUR NAUSEA MEDICATION  *UNUSUAL SHORTNESS OF BREATH  *UNUSUAL BRUISING OR BLEEDING  TENDERNESS IN MOUTH AND THROAT WITH OR WITHOUT PRESENCE OF ULCERS  *URINARY PROBLEMS  *BOWEL PROBLEMS  UNUSUAL RASH Items with * indicate a potential emergency and should be followed up as soon as possible.   Please let the nurse know about any problems that you may have experienced. Feel free to call the clinic you have any questions or concerns. The clinic phone number is [phone].

## 2013-01-13 NOTE — Progress Notes (Signed)
Pt  C/o feeling "fidgety" after receiving Benadryl.  Noted pt moving legs back and forth.  Called Zollie Scale, NP and received order for lorazepam .5mg  SL.

## 2013-01-14 ENCOUNTER — Other Ambulatory Visit: Payer: Medicare Other | Admitting: Lab

## 2013-01-14 ENCOUNTER — Ambulatory Visit: Payer: Medicare Other | Admitting: Physician Assistant

## 2013-01-14 ENCOUNTER — Ambulatory Visit: Payer: Medicare Other

## 2013-01-20 ENCOUNTER — Other Ambulatory Visit: Payer: Medicare Other | Admitting: Lab

## 2013-01-21 ENCOUNTER — Encounter: Payer: Self-pay | Admitting: Physician Assistant

## 2013-01-21 ENCOUNTER — Telehealth: Payer: Self-pay | Admitting: Oncology

## 2013-01-21 ENCOUNTER — Other Ambulatory Visit: Payer: Medicare Other | Admitting: Lab

## 2013-01-21 ENCOUNTER — Ambulatory Visit (HOSPITAL_BASED_OUTPATIENT_CLINIC_OR_DEPARTMENT_OTHER): Payer: Medicare Other | Admitting: Physician Assistant

## 2013-01-21 ENCOUNTER — Ambulatory Visit: Payer: Medicare Other

## 2013-01-21 ENCOUNTER — Ambulatory Visit: Payer: Medicare Other | Admitting: Physician Assistant

## 2013-01-21 ENCOUNTER — Other Ambulatory Visit (HOSPITAL_BASED_OUTPATIENT_CLINIC_OR_DEPARTMENT_OTHER): Payer: Medicare Other | Admitting: Lab

## 2013-01-21 VITALS — BP 173/71 | HR 82 | Temp 97.8°F | Resp 20 | Ht 69.0 in | Wt 165.1 lb

## 2013-01-21 DIAGNOSIS — C50911 Malignant neoplasm of unspecified site of right female breast: Secondary | ICD-10-CM

## 2013-01-21 DIAGNOSIS — L988 Other specified disorders of the skin and subcutaneous tissue: Secondary | ICD-10-CM

## 2013-01-21 DIAGNOSIS — L97509 Non-pressure chronic ulcer of other part of unspecified foot with unspecified severity: Secondary | ICD-10-CM | POA: Diagnosis not present

## 2013-01-21 DIAGNOSIS — M205X9 Other deformities of toe(s) (acquired), unspecified foot: Secondary | ICD-10-CM | POA: Diagnosis not present

## 2013-01-21 DIAGNOSIS — C50919 Malignant neoplasm of unspecified site of unspecified female breast: Secondary | ICD-10-CM

## 2013-01-21 DIAGNOSIS — L089 Local infection of the skin and subcutaneous tissue, unspecified: Secondary | ICD-10-CM

## 2013-01-21 DIAGNOSIS — L989 Disorder of the skin and subcutaneous tissue, unspecified: Secondary | ICD-10-CM

## 2013-01-21 DIAGNOSIS — K219 Gastro-esophageal reflux disease without esophagitis: Secondary | ICD-10-CM

## 2013-01-21 DIAGNOSIS — C773 Secondary and unspecified malignant neoplasm of axilla and upper limb lymph nodes: Secondary | ICD-10-CM | POA: Diagnosis not present

## 2013-01-21 DIAGNOSIS — L02619 Cutaneous abscess of unspecified foot: Secondary | ICD-10-CM | POA: Diagnosis not present

## 2013-01-21 DIAGNOSIS — L03039 Cellulitis of unspecified toe: Secondary | ICD-10-CM | POA: Diagnosis not present

## 2013-01-21 DIAGNOSIS — IMO0002 Reserved for concepts with insufficient information to code with codable children: Secondary | ICD-10-CM | POA: Diagnosis not present

## 2013-01-21 LAB — CBC WITH DIFFERENTIAL/PLATELET
BASO%: 1 % (ref 0.0–2.0)
HCT: 27.4 % — ABNORMAL LOW (ref 34.8–46.6)
MCHC: 33.6 g/dL (ref 31.5–36.0)
MONO#: 0.7 10*3/uL (ref 0.1–0.9)
RBC: 3.07 10*6/uL — ABNORMAL LOW (ref 3.70–5.45)
RDW: 19.8 % — ABNORMAL HIGH (ref 11.2–14.5)
WBC: 3 10*3/uL — ABNORMAL LOW (ref 3.9–10.3)
lymph#: 0.6 10*3/uL — ABNORMAL LOW (ref 0.9–3.3)
nRBC: 0 % (ref 0–0)

## 2013-01-21 MED ORDER — CEPHALEXIN 500 MG PO CAPS: 500.0000 mg | ORAL_CAPSULE | Freq: Three times a day (TID) | ORAL | Status: DC

## 2013-01-21 NOTE — Telephone Encounter (Signed)
, °

## 2013-01-21 NOTE — Progress Notes (Signed)
ID: Debra Mcintyre   DOB: 1940/11/29  MR#: [id number]  NUU#:[id number]  PCP: [name], MD GYN: [name], [name], [name],   HISTORY OF PRESENT ILLNESS: Debra Mcintyre had screening mammography at [facility] 04/10/2012 raising the question of some axillary lymph nodes on the right. Additional views of the right axilla and right axillary ultrasound performed 04/04/2012 showed 3 lymph nodes that appeared larger than prior. The largest measured 1.5 cm. 2 of them had cortical thickening. Close followup was suggested, and repeat right axillary ultrasound 06/25/2012 showed no significant change in the lymph nodes in question. Followup ultrasound in 3 months was recommended, but in the interim the patient saw her primary physician, Dr. [name] and ADL, and he set her up for a chest CT on 07/08/2012, which showed a mild asymmetric density in the upper mid right breast. There was a prominent right axillary lymph node measuring 1.9 cm, with a few smaller adjacent nodes asymmetric with compared to the left side. Again, short interval followup was recommended, and on 10/07/2012 the patient had digital right mammography and ultrasonography, now at the breast Center. Dr. [name] noted pleomorphic calcifications spanning an area of 6.3 cm without associated mass. Physical exam was unremarkable. Ultrasound showed an area of adenopathy in the right axilla measuring 1.6 cm, with a second lymph node with a thickened cortex measuring 1.2 cm. No suspicious mass was seen by ultrasound in the right breast.  Biopsy of the larger axillary lymph node on 10/07/2012 showed ([id number]) an invasive ductal carcinoma, triple negative, with an MIB-1 of 66%. Biopsy of the right breast the next day, [id number]) showed invasive ductal carcinoma, grade 3. Breast MRI 10/14/2012 showed a total area of irregular enhancement in the right breast measuring up to 12 cm. MRI guided biopsy of an  area in the upper inner quadrant of the right breast on 10/23/2012 showed ([id number]) ductal carcinoma in situ, with foci worrisome for invasion.  The patient's subsequent history is as detailed below  INTERVAL HISTORY: Debra Mcintyre returns today for followup of her locally advanced right breast cancer.  She is due for her third of 12 planned weekly doses of carboplatin/paclitaxel being given in the neoadjuvant setting.  She is not feeling particularly well today. She is more tired than she has been. Her legs have 8, and she is taking occasional dose of oxycodone/APAP which helps. Fortunately, she has avoid constipation with the use of Dulcolax suppositories.  Her biggest complaints today and above her feet, specifically skin changes, cracking and peeling, and numbness in the toes. Her skin is been very dry and is "coming off in sheets" on the bottom of her feet bilaterally. She had a small area where skin: Off the bottom of the left great toe, and left a slow healing lesion which is sore to touch. The toe itself has become swollen as has the left foot. This is all making it difficult for Debra Mcintyre to walk. In addition, since her treatment last week, she has noted significant numbness in her toes bilaterally. This has worsened and has become more consistent after only 2 doses of paclitaxel.  REVIEW OF SYSTEMS: Debra Mcintyre has had no fevers or chills. She denies any bruising or abnormal bleeding. Her appetite is fairly good and she's had no nausea or change in bowel or bladder habits. She's taking Pepcid for reflux which is helpful.in  she denies any abnormal headaches or dizziness.  A detailed review of systems is  otherwise stable and noncontributory.    PAST MEDICAL HISTORY: Past Medical History  Diagnosis Date  . Hypertension   . Cerebrovascular disease, unspecified   . Cerebral aneurysm, nonruptured   . Pure hypercholesterolemia   . Hypothyroidism   . Colonic polyp   . Breast cancer 09/2012     right breast/right axillary lymph node t2,pn1, stage 11b, invasive ductal carcinma, grade 3, triple negative, with an mib-1 of 15%  . History of chemotherapy     PAST SURGICAL HISTORY: Past Surgical History  Procedure Laterality Date  . Left middle cerebral artery angioplasty  2004    by [name] had 2 follow up occurances  . Portacath placement N/A 11/05/2012    Procedure: INSERTION PORT-A-CATH;  Surgeon: [name], MD;  Location: [facility];  Service: General;  Laterality: N/A;  . Breast biopsy Right 10/23/2012  . Breast biopsy Right 10/08/2012    FAMILY HISTORY Family History  Problem Relation Age of Onset  . Lung cancer Mother 60  . Heart disease Father   . Colon cancer Brother 44  . Alcohol abuse Brother   . Colon cancer Maternal Uncle     dx in his 47s  . Cancer Maternal Aunt     unknown type  . Ovarian cancer Other 28   the patient's father died at the age of 71. The patient's mother died at the age of 50 from lung cancer. She was not a smoker. The cancer was diagnosed when she was 70 years old. The patient's mother had 2 sisters and one brother. One of those sisters had lung cancer and a brother had colon cancer, both diagnosed in their 7s. The patient herself has one brother who died from colon cancer at the age of 35. That brother has a daughter who was diagnosed with uterine cancer at the age of 61. All this raises the question of possible Lynch syndrome and I have referred the patient for genetics counseling.  GYNECOLOGIC HISTORY: Menarche age 49, menopause around 56. The patient is GX P0. She took birth control pills for many years with no complications.  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 [name] who lives in "[place]" and is financial vice president of [employer], and Shubert who lives in [place] and works as a  Transport planner. The patient has 5 grandchildren. She is currently not a church attender   ADVANCED DIRECTIVES: Not in place; this was discussed 10/29/2012 and the patient was advised to complete a healthcare power of attorney document  HEALTH MAINTENANCE: History  Substance Use Topics  . Smoking status: Former Smoker -- 1.00 packs/day for 20 years    Types: Cigarettes    Quit date: 04/02/1986  . Smokeless tobacco: Never Used  . Alcohol Use: No     Colonoscopy: 2012  PAP: 2012  Bone density:  Lipid panel:  No Known Allergies  Current Outpatient Prescriptions  Medication Sig Dispense Refill  . acyclovir (ZOVIRAX) 400 MG tablet Take 1 tablet (400 mg total) by mouth 2 (two) times daily.  60 tablet  2  . Alum & Mag Hydroxide-Simeth (MAGIC MOUTHWASH W/LIDOCAINE) SOLN Take 5 mLs by mouth 3 (three) times daily as needed.  100 mL  1  . aspirin 325 MG tablet Take 325 mg by mouth daily.        . Calcium Carbonate-Vitamin D (CALCIUM 600+D) 600-200 MG-UNIT TABS  Take 1 tablet by mouth 2 (two) times daily.        . ciprofloxacin (CIPRO) 500 MG tablet Take 1 tablet (500 mg total) by mouth 2 (two) times daily.  14 tablet  3  . clopidogrel (PLAVIX) 75 MG tablet TAKE ONE TABLET BY MOUTH EVERY DAY  30 tablet  6  . dexamethasone (DECADRON) 4 MG tablet       . famotidine (PEPCID) 40 MG tablet Take 1 tablet (40 mg total) by mouth daily.  30 tablet  3  . fluconazole (DIFLUCAN) 100 MG tablet Take 1 tablet (100 mg total) by mouth daily.  7 tablet  3  . levothyroxine (SYNTHROID, LEVOTHROID) 125 MCG tablet Take 1 tablet (125 mcg total) by mouth daily.  30 tablet  11  . lidocaine-prilocaine (EMLA) cream Apply topically as needed. Apply over port 1-2 hors before chemo and cover with plastic wrap  30 g  0  . loratadine (CLARITIN) 10 MG tablet Take 10 mg by mouth daily.      Marland Kitchen LORazepam (ATIVAN) 0.5 MG tablet Take 1 tablet (0.5 mg total) by mouth at bedtime as needed for anxiety (Nausea or vomiting).  30 tablet  0   . metoprolol succinate (TOPROL-XL) 100 MG 24 hr tablet TAKE ONE TABLET BY MOUTH EVERY DAY  30 tablet  11  . metroNIDAZOLE (METROCREAM) 0.75 % cream       . Multiple Vitamin (MULTIVITAMIN) capsule Take 1 capsule by mouth daily.        . Naproxen Sodium (ALEVE) 220 MG CAPS Take 1 capsule by mouth as needed.      . ondansetron (ZOFRAN) 8 MG tablet 1 tab by mouth twice daily x 3 days after chemo, then every 12 hours if needed for nausea  30 tablet  1  . oxyCODONE-acetaminophen (ROXICET) 5-325 MG per tablet Take 1 tablet by mouth every 6 (six) hours as needed for pain.  60 tablet  0  . prochlorperazine (COMPAZINE) 10 MG tablet       . rosuvastatin (CRESTOR) 20 MG tablet Take 1 tablet (20 mg total) by mouth daily.  30 tablet  11  . venlafaxine XR (EFFEXOR-XR) 75 MG 24 hr capsule Take 1 capsule (75 mg total) by mouth daily.  30 capsule  6  . cephALEXin (KEFLEX) 500 MG capsule Take 1 capsule (500 mg total) by mouth 3 (three) times daily.  21 capsule  0  . lisinopril-hydrochlorothiazide (PRINZIDE) 20-12.5 MG per tablet Take 1 tablet by mouth daily.  30 tablet  11   No current facility-administered medications for this visit.    OBJECTIVE: Middle-aged white woman who appears moderately fatigued Filed Vitals:   01/21/13 1218  BP: 173/71  Pulse: 82  Temp: 97.8 F (36.6 C)  Resp: 20     Body mass index is 24.37 kg/(m^2).    ECOG FS: 1 Filed Weights   01/21/13 1218  Weight: 165 lb 1.6 oz (74.889 kg)   Sclerae unicteric Oropharynx clear No cervical or supraclavicular adenopathy; Lungs clear to auscultation bilaterally, no crackles, wheezes, or rhonchi Heart regular rate and rhythm Abdomen soft, nontender, positive bowel sounds MSK no focal spinal tenderness, The skin on the soles of the feet is extremely dry, cracking, and peeling. It is somewhat sensitive to touch. There is also a small skin lesion measuring approximately 1 cm in diameter on the bottom of the left great toe. The toe itself is  somewhat warm to touch and swollen, with some very mild swelling  in the foot itself, nonpitting. No additional peripheral swelling. Neuro: nonfocal, well oriented, pleasant affect Breasts: Deferred.    LAB RESULTS: Lab Results  Component Value Date   WBC 3.0* 01/21/2013   NEUTROABS 1.7 01/21/2013   HGB 9.2* 01/21/2013   HCT 27.4* 01/21/2013   MCV 89.3 01/21/2013   PLT 180 01/21/2013      Chemistry      Component Value Date/Time   NA 139 01/13/2013 0820   NA 138 06/04/2012 1124   K 3.7 01/13/2013 0820   K 4.3 06/04/2012 1124   CL 106 01/13/2013 0820   CL 103 06/04/2012 1124   CO2 24 01/13/2013 0820   CO2 29 06/04/2012 1124   BUN 18.5 01/13/2013 0820   BUN 14 06/04/2012 1124   CREATININE 0.7 01/13/2013 0820   CREATININE 0.7 06/04/2012 1124      Component Value Date/Time   CALCIUM 8.9 01/13/2013 0820   CALCIUM 9.9 06/04/2012 1124   ALKPHOS 48 01/13/2013 0820   ALKPHOS 47 06/04/2012 1124   AST 16 01/13/2013 0820   AST 27 06/04/2012 1124   ALT 20 01/13/2013 0820   ALT 32 06/04/2012 1124   BILITOT 0.61 01/13/2013 0820   BILITOT 1.1 06/04/2012 1124       STUDIES: Mr Breast Bilateral W Wo Contrast  12/30/2012   *RADIOLOGY REPORT*  Clinical Data: Known right breast grade III invasive ductal carcinoma with lymphovascular invasion and a metastatic right axillary lymph node.  The patient underwent an MR guided core biopsy 8 cm posterior and superior to the original biopsy, which showed DCIS with foci worrisome for invasion.  BILATERAL BREAST MRI WITH AND WITHOUT CONTRAST  Technique: Multiplanar, multisequence MR images of both breasts were obtained prior to and following the intravenous administration of 15ml of multihance.  Three dimensional images were evaluated at the independent DynaCad workstation.  Comparison:  Mammograms dated 10/23/2012, 10/08/2012, 10/07/2012 and 08/20/2013and prior MRIs dated 10/14/2012 and 10/23/2012.  Findings:  Right breast:  There has been good interval response to the  neoadjuvant chemotherapy.  The irregular linear and stippled enhancement throughout the entire upper central right breast is smaller and shows less enhancement.  The enhancement measures  4.0 x 11.0 x 8.0 cm (transverse x AP x CC).  On the prior MRI dated 11/11/2011 it measured 8 x 12 x 7 cm.  Two biopsy clip artifacts located 7 cm apart are seen in the breast.  Left breast:  No abnormal enhancement seen in the left breast.  There has been significant interval reduction in the size of the level I right axillary adenopathy.  Right axillary lymph nodes persist but are not pathologically enlarged.  IMPRESSION: Positive interval response to neoadjuvant chemotherapy with less enhancement in the right breast and reduction in size of the axillary adenopathy.  RECOMMENDATION: Treatment planning.  THREE-DIMENSIONAL MR IMAGE RENDERING ON INDEPENDENT WORKSTATION:  Three-dimensional MR images were rendered by post-processing of the original MR data on an independent workstation.  The three- dimensional MR images were interpreted, and findings were reported in the accompanying complete MRI report for this study.  BI-RADS CATEGORY 6:  Known biopsy-proven malignancy - appropriate action should be taken.   Original Report Authenticated By: [name], M.D.    ASSESSMENT: 72 y.o. [place] woman   (1)  status post right breast and right axillary lymph node biopsies February 2014 of a clinical T2, pN1, stage IIB,  invasive ductal carcinoma, grade 3, triple negative, with an MIB-1 of 15%  (2)  a third biopsy from a different quadrant 10/23/2012 showed ductal carcinoma in situ  (3)  currently being treated in the neoadjuvant setting, completed 4 dose dense cycles of doxorubicin/ cyclophosphamide 12/23/2012, followed by 12 weekly doses of paclitaxel and carboplatin started 01/07/2013  (4) family history suggestive of Lynch syndrome; genetics evaluation pending  PLAN:  I reviewed this case with Dr. Darnelle Catalan, and due to the  neuropathy, the skin lesion on the left great toe, and the general skin changes on the feet, we both agree that it would be prudent to held Dailey's chemotherapy today and reassess next week when she returns on 01/28/2013.   I am starting her on Keflex, 500 mg three times daily for the next week for what appears to be an early cellulitis of the left toe.  [facility] are [facility], hoping they may also be able to safely remove some of the thickened dead skin on the soles of her feet which is causing some discomfort.  Reviewed her case again when she returns next week. If she is unable to tolerate the carbo/paclitaxel regimen, we will consider switching to carboplatin/gemcitabine. When she completes her chemotherapy will repeat an MRI and proceed to surgery.   This was all reviewed with Zoha today who voices understanding and agreement with our plan, and she noticed called any changes or problems prior to her appointment here next week.  Trellis Vanoverbeke PA-C   01/21/2013

## 2013-01-22 ENCOUNTER — Encounter: Payer: Self-pay | Admitting: Oncology

## 2013-01-27 ENCOUNTER — Other Ambulatory Visit: Payer: Medicare Other | Admitting: Lab

## 2013-01-28 ENCOUNTER — Ambulatory Visit (HOSPITAL_BASED_OUTPATIENT_CLINIC_OR_DEPARTMENT_OTHER): Payer: Medicare Other

## 2013-01-28 ENCOUNTER — Encounter: Payer: Self-pay | Admitting: Physician Assistant

## 2013-01-28 ENCOUNTER — Ambulatory Visit (HOSPITAL_COMMUNITY)
Admission: RE | Admit: 2013-01-28 | Discharge: 2013-01-28 | Disposition: A | Discharge status: Home / Self Care | Payer: Medicare Other | Source: Ambulatory Visit | Attending: Oncology | Admitting: Oncology

## 2013-01-28 ENCOUNTER — Telehealth: Payer: Self-pay | Admitting: *Deleted

## 2013-01-28 ENCOUNTER — Other Ambulatory Visit (HOSPITAL_BASED_OUTPATIENT_CLINIC_OR_DEPARTMENT_OTHER): Payer: Medicare Other | Admitting: Lab

## 2013-01-28 ENCOUNTER — Ambulatory Visit (HOSPITAL_BASED_OUTPATIENT_CLINIC_OR_DEPARTMENT_OTHER): Payer: Medicare Other | Admitting: Physician Assistant

## 2013-01-28 ENCOUNTER — Other Ambulatory Visit: Payer: Self-pay | Admitting: *Deleted

## 2013-01-28 VITALS — BP 154/76 | HR 102 | Temp 97.6°F | Resp 20 | Ht 69.0 in | Wt 165.3 lb

## 2013-01-28 DIAGNOSIS — C773 Secondary and unspecified malignant neoplasm of axilla and upper limb lymph nodes: Secondary | ICD-10-CM

## 2013-01-28 DIAGNOSIS — K219 Gastro-esophageal reflux disease without esophagitis: Secondary | ICD-10-CM

## 2013-01-28 DIAGNOSIS — R599 Enlarged lymph nodes, unspecified: Secondary | ICD-10-CM

## 2013-01-28 DIAGNOSIS — Z5111 Encounter for antineoplastic chemotherapy: Secondary | ICD-10-CM

## 2013-01-28 DIAGNOSIS — C50911 Malignant neoplasm of unspecified site of right female breast: Secondary | ICD-10-CM

## 2013-01-28 DIAGNOSIS — C50919 Malignant neoplasm of unspecified site of unspecified female breast: Secondary | ICD-10-CM

## 2013-01-28 DIAGNOSIS — C50119 Malignant neoplasm of central portion of unspecified female breast: Secondary | ICD-10-CM

## 2013-01-28 DIAGNOSIS — D0590 Unspecified type of carcinoma in situ of unspecified breast: Secondary | ICD-10-CM

## 2013-01-28 DIAGNOSIS — T82598A Other mechanical complication of other cardiac and vascular devices and implants, initial encounter: Secondary | ICD-10-CM | POA: Diagnosis not present

## 2013-01-28 DIAGNOSIS — Z452 Encounter for adjustment and management of vascular access device: Secondary | ICD-10-CM | POA: Diagnosis not present

## 2013-01-28 DIAGNOSIS — Y838 Other surgical procedures as the cause of abnormal reaction of the patient, or of later complication, without mention of misadventure at the time of the procedure: Secondary | ICD-10-CM | POA: Insufficient documentation

## 2013-01-28 DIAGNOSIS — C50111 Malignant neoplasm of central portion of right female breast: Secondary | ICD-10-CM

## 2013-01-28 LAB — CBC WITH DIFFERENTIAL/PLATELET
Basophils Absolute: 0.1 10*3/uL (ref 0.0–0.1)
Eosinophils Absolute: 0.1 10*3/uL (ref 0.0–0.5)
HGB: 10.8 g/dL — ABNORMAL LOW (ref 11.6–15.9)
NEUT#: 5 10*3/uL (ref 1.5–6.5)
RDW: 18.5 % — ABNORMAL HIGH (ref 11.2–14.5)
WBC: 7.2 10*3/uL (ref 3.9–10.3)
lymph#: 0.9 10*3/uL (ref 0.9–3.3)
nRBC: 0 % (ref 0–0)

## 2013-01-28 LAB — COMPREHENSIVE METABOLIC PANEL (CC13)
ALT: 19 U/L (ref 0–55)
Albumin: 3.6 g/dL (ref 3.5–5.0)
Alkaline Phosphatase: 65 U/L (ref 40–150)
CO2: 23 mEq/L (ref 22–29)
Glucose: 110 mg/dl — ABNORMAL HIGH (ref 70–99)
Potassium: 3.7 mEq/L (ref 3.5–5.1)
Sodium: 138 mEq/L (ref 136–145)
Total Protein: 7.1 g/dL (ref 6.4–8.3)

## 2013-01-28 MED ORDER — SODIUM CHLORIDE 0.9 % IV SOLN
230.0000 mg | Freq: Once | INTRAVENOUS | Status: AC
  Administered 2013-01-28: 230 mg via INTRAVENOUS
  Filled 2013-01-28: qty 23

## 2013-01-28 MED ORDER — SODIUM CHLORIDE 0.9 % IJ SOLN
10.0000 mL | INTRAMUSCULAR | Status: DC | PRN
  Administered 2013-01-28: 10 mL
  Filled 2013-01-28: qty 10

## 2013-01-28 MED ORDER — DEXAMETHASONE SODIUM PHOSPHATE 20 MG/5ML IJ SOLN
20.0000 mg | Freq: Once | INTRAMUSCULAR | Status: AC
  Administered 2013-01-28: 20 mg via INTRAVENOUS

## 2013-01-28 MED ORDER — LORAZEPAM 2 MG/ML IJ SOLN
0.5000 mg | Freq: Once | INTRAMUSCULAR | Status: AC
  Administered 2013-01-28: 0.5 mg via INTRAVENOUS

## 2013-01-28 MED ORDER — SODIUM CHLORIDE 0.9 % IV SOLN
80.0000 mg/m2 | Freq: Once | INTRAVENOUS | Status: AC
  Administered 2013-01-28: 150 mg via INTRAVENOUS
  Filled 2013-01-28: qty 25

## 2013-01-28 MED ORDER — SODIUM CHLORIDE 0.9 % IV SOLN
Freq: Once | INTRAVENOUS | Status: AC
  Administered 2013-01-28: 12:00:00 via INTRAVENOUS

## 2013-01-28 MED ORDER — ONDANSETRON 16 MG/50ML IVPB (CHCC)
16.0000 mg | Freq: Once | INTRAVENOUS | Status: AC
  Administered 2013-01-28: 16 mg via INTRAVENOUS

## 2013-01-28 MED ORDER — DIPHENHYDRAMINE HCL 50 MG/ML IJ SOLN
50.0000 mg | Freq: Once | INTRAMUSCULAR | Status: AC
  Administered 2013-01-28: 50 mg via INTRAVENOUS

## 2013-01-28 MED ORDER — HEPARIN SOD (PORK) LOCK FLUSH 100 UNIT/ML IV SOLN
500.0000 [IU] | Freq: Once | INTRAVENOUS | Status: AC | PRN
  Administered 2013-01-28: 500 [IU]
  Filled 2013-01-28: qty 5

## 2013-01-28 MED ORDER — FAMOTIDINE IN NACL 20-0.9 MG/50ML-% IV SOLN
20.0000 mg | Freq: Once | INTRAVENOUS | Status: AC
  Administered 2013-01-28: 20 mg via INTRAVENOUS

## 2013-01-28 NOTE — Progress Notes (Signed)
ID: Debra Mcintyre   DOB: 1941/01/26  MR#: [id number]  WUJ#:[id number]  PCP: Debra Mcalpine, MD GYN: Debra Mcintyre SU: Debra Mcintyre OTHER MD: Debra Mcintyre, Debra Mcintyre, Debra Mcintyre,   HISTORY OF PRESENT ILLNESS: Debra Mcintyre had screening mammography at [facility] 04/10/2012 raising the question of some axillary lymph nodes on the right. Additional views of the right axilla and right axillary ultrasound performed 04/04/2012 showed 3 lymph nodes that appeared larger than prior. The largest measured 1.5 cm. 2 of them had cortical thickening. Close followup was suggested, and repeat right axillary ultrasound 06/25/2012 showed no significant change in the lymph nodes in question. Followup ultrasound in 3 months was recommended, but in the interim the patient saw her primary physician, Dr. Lorin Mcintyre and ADL, and he set her up for a chest CT on 07/08/2012, which showed a mild asymmetric density in the upper mid right breast. There was a prominent right axillary lymph node measuring 1.9 cm, with a few smaller adjacent nodes asymmetric with compared to the left side. Again, short interval followup was recommended, and on 10/07/2012 the patient had digital right mammography and ultrasonography, now at the breast Center. Dr. Judyann Mcintyre noted pleomorphic calcifications spanning an area of 6.3 cm without associated mass. Physical exam was unremarkable. Ultrasound showed an area of adenopathy in the right axilla measuring 1.6 cm, with a second lymph node with a thickened cortex measuring 1.2 cm. No suspicious mass was seen by ultrasound in the right breast.  Biopsy of the larger axillary lymph node on 10/07/2012 showed ([id number]) an invasive ductal carcinoma, triple negative, with an MIB-1 of 66%. Biopsy of the right breast the next day, [id number]) showed invasive ductal carcinoma, grade 3. Breast MRI 10/14/2012 showed a total area of irregular enhancement in the right breast measuring up to 12 cm. MRI guided biopsy of an  area in the upper inner quadrant of the right breast on 10/23/2012 showed ([id number]) ductal carcinoma in situ, with foci worrisome for invasion.  The patient's subsequent history is as detailed below  INTERVAL HISTORY: Debra Mcintyre returns today accompanied by her friend, Debra Mcintyre, for followup of her locally advanced right breast cancer.  She is due for her third of 12 planned weekly doses of carboplatin/paclitaxel being given in the neoadjuvant setting, with treatment having been held one week ago secondary to neuropathy and a skin lesion on the left toe. The neuropathy has improved significantly, almost resolved with only occasional signs of numbness in the toes. This is a significant improvement since last week. She has no signs of numbness or tingling in the upper extremities.  Debra Mcintyre was seen by a podiatrist last week following her appointment, and the skin lesion on the bottom surface of her left toe continues to heal appropriately, with no evidence of infection. She is completed her course of Cipro, and also utilized some topical antibiotics as prescribed a podiatrist.    REVIEW OF SYSTEMS: Debra Mcintyre has had no fevers or chills. She denies additional skin changes, and has had no  bruising or abnormal bleeding. Her appetite is fairly good although she does have some mild taste aversion.  She's had no nausea or  emesis. She tends to have some mild constipation and we discussed utilizing Colace and MiraLAX as needed. She's taking Pepcid for reflux which is helpful. She denies any new cough, phlegm production, increased shortness of breath, or chest pain. She denies any abnormal headaches or dizziness.currently, she has no unusual myalgias, arthralgias, or bony pain.  A detailed  review of systems is otherwise stable and noncontributory.    PAST MEDICAL HISTORY: Past Medical History  Diagnosis Date  . Hypertension   . Cerebrovascular disease, unspecified   . Cerebral aneurysm, nonruptured   . Pure  hypercholesterolemia   . Hypothyroidism   . Colonic polyp   . Breast cancer 09/2012    right breast/right axillary lymph node t2,pn1, stage 11b, invasive ductal carcinma, grade 3, triple negative, with an mib-1 of 15%  . History of chemotherapy     PAST SURGICAL HISTORY: Past Surgical History  Procedure Laterality Date  . Left middle cerebral artery angioplasty  2004    by [name] had 2 follow up occurances  . Portacath placement N/A 11/05/2012    Procedure: INSERTION PORT-A-CATH;  Surgeon: Debra Loron, MD;  Location: [facility];  Service: General;  Laterality: N/A;  . Breast biopsy Right 10/23/2012  . Breast biopsy Right 10/08/2012    FAMILY HISTORY Family History  Problem Relation Age of Onset  . Lung cancer Mother 60  . Heart disease Father   . Colon cancer Brother 65  . Alcohol abuse Brother   . Colon cancer Maternal Uncle     dx in his 72s  . Cancer Maternal Aunt     unknown type  . Ovarian cancer Other 33   the patient's father died at the age of 37. The patient's mother died at the age of 66 from lung cancer. She was not a smoker. The cancer was diagnosed when she was 70 years old. The patient's mother had 2 sisters and one brother. One of those sisters had lung cancer and a brother had colon cancer, both diagnosed in their 68s. The patient herself has one brother who died from colon cancer at the age of 25. That brother has a daughter who was diagnosed with uterine cancer at the age of 20. All this raises the question of possible Lynch syndrome and I have referred the patient for genetics counseling.  GYNECOLOGIC HISTORY: Menarche age 35, menopause around 4. The patient is GX P0. She took birth control pills for many years with no complications.  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 Debra Mcintyre who lives in [place]" and  is financial vice president of [employer], and Debra Mcintyre who lives in [place] and works as a Transport planner. The patient has 5 grandchildren. She is currently not a church attender   ADVANCED DIRECTIVES: Not in place; this was discussed 10/29/2012 and the patient was advised to complete a healthcare power of attorney document  HEALTH MAINTENANCE: History  Substance Use Topics  . Smoking status: Former Smoker -- 1.00 packs/day for 20 years    Types: Cigarettes    Quit date: 04/02/1986  . Smokeless tobacco: Never Used  . Alcohol Use: No     Colonoscopy: 2012  PAP: 2012  Bone density:  Lipid panel:  No Known Allergies  Current Outpatient Prescriptions  Medication Sig Dispense Refill  . acyclovir (ZOVIRAX) 400 MG tablet Take 1 tablet (400 mg total) by mouth 2 (two) times daily.  60 tablet  2  . Alum & Mag Hydroxide-Simeth (MAGIC MOUTHWASH W/LIDOCAINE) SOLN Take 5 mLs by mouth 3 (three) times daily as needed.  100 mL  1  . aspirin 325 MG tablet Take 325 mg by mouth daily.        . Calcium Carbonate-Vitamin D (CALCIUM  600+D) 600-200 MG-UNIT TABS Take 1 tablet by mouth 2 (two) times daily.        . clopidogrel (PLAVIX) 75 MG tablet TAKE ONE TABLET BY MOUTH EVERY DAY  30 tablet  6  . dexamethasone (DECADRON) 4 MG tablet       . famotidine (PEPCID) 40 MG tablet Take 1 tablet (40 mg total) by mouth daily.  30 tablet  3  . fluconazole (DIFLUCAN) 100 MG tablet Take 1 tablet (100 mg total) by mouth daily.  7 tablet  3  . levothyroxine (SYNTHROID, LEVOTHROID) 125 MCG tablet Take 1 tablet (125 mcg total) by mouth daily.  30 tablet  11  . lidocaine-prilocaine (EMLA) cream Apply topically as needed. Apply over port 1-2 hors before chemo and cover with plastic wrap  30 g  0  . loratadine (CLARITIN) 10 MG tablet Take 10 mg by mouth daily.      Marland Kitchen LORazepam (ATIVAN) 0.5 MG tablet Take 1 tablet (0.5 mg total) by mouth at bedtime as needed for anxiety (Nausea or vomiting).  30 tablet  0  . metoprolol  succinate (TOPROL-XL) 100 MG 24 hr tablet TAKE ONE TABLET BY MOUTH EVERY DAY  30 tablet  11  . metroNIDAZOLE (METROCREAM) 0.75 % cream       . Multiple Vitamin (MULTIVITAMIN) capsule Take 1 capsule by mouth daily.        . Naproxen Sodium (ALEVE) 220 MG CAPS Take 1 capsule by mouth as needed.      . ondansetron (ZOFRAN) 8 MG tablet 1 tab by mouth twice daily x 3 days after chemo, then every 12 hours if needed for nausea  30 tablet  1  . oxyCODONE-acetaminophen (ROXICET) 5-325 MG per tablet Take 1 tablet by mouth every 6 (six) hours as needed for pain.  60 tablet  0  . prochlorperazine (COMPAZINE) 10 MG tablet       . rosuvastatin (CRESTOR) 20 MG tablet Take 1 tablet (20 mg total) by mouth daily.  30 tablet  11  . silver sulfADIAZINE (SILVADENE) 1 % cream       . venlafaxine XR (EFFEXOR-XR) 75 MG 24 hr capsule Take 1 capsule (75 mg total) by mouth daily.  30 capsule  6  . cephALEXin (KEFLEX) 500 MG capsule Take 1 capsule (500 mg total) by mouth 3 (three) times daily.  21 capsule  0  . ciprofloxacin (CIPRO) 500 MG tablet Take 1 tablet (500 mg total) by mouth 2 (two) times daily.  14 tablet  3  . lisinopril-hydrochlorothiazide (PRINZIDE) 20-12.5 MG per tablet Take 1 tablet by mouth daily.  30 tablet  11   No current facility-administered medications for this visit.    OBJECTIVE: Middle-aged white woman who appears comfortable and is in no acute distress Filed Vitals:   01/28/13 0926  BP: 154/76  Pulse: 102  Temp: 97.6 F (36.4 C)  Resp: 20     Body mass index is 24.4 kg/(m^2).    ECOG FS: 1 Filed Weights   01/28/13 0926  Weight: 165 lb 4.8 oz (74.98 kg)   Sclerae unicteric Oropharynx clear, no evidence of ulcerations or thrush No cervical or supraclavicular adenopathy; Lungs clear to auscultation bilaterally, no crackles, wheezes, or rhonchi Heart regular rate and rhythm Abdomen soft, nontender, positive bowel sounds MSK no focal spinal tenderness, The skin on the soles of the feet  looks good, with no current evidence of cracking or peeling. The lesion on the bottom surface of the left great  toe is healing, almost resolved. There is no pain and no evidence of drainage or infection. No swelling noted in the toe or the foot.  No peripheral edema Neuro: nonfocal, well oriented, pleasant affect Breasts: Unable to palpate a distinct mass in the right breast today. Left breast is unremarkable. Axillae are benign bilaterally palpable adenopathy. Port is intact in the left upper chest wall with no erythema, edema, or evidence of infection.    LAB RESULTS: Lab Results  Component Value Date   WBC 7.2 01/28/2013   NEUTROABS 5.0 01/28/2013   HGB 10.8* 01/28/2013   HCT 32.8* 01/28/2013   MCV 89.9 01/28/2013   PLT Clumped Platelets--Appears Adequate 01/28/2013      Chemistry      Component Value Date/Time   NA 139 01/13/2013 0820   NA 138 06/04/2012 1124   K 3.7 01/13/2013 0820   K 4.3 06/04/2012 1124   CL 106 01/13/2013 0820   CL 103 06/04/2012 1124   CO2 24 01/13/2013 0820   CO2 29 06/04/2012 1124   BUN 18.5 01/13/2013 0820   BUN 14 06/04/2012 1124   CREATININE 0.7 01/13/2013 0820   CREATININE 0.7 06/04/2012 1124      Component Value Date/Time   CALCIUM 8.9 01/13/2013 0820   CALCIUM 9.9 06/04/2012 1124   ALKPHOS 48 01/13/2013 0820   ALKPHOS 47 06/04/2012 1124   AST 16 01/13/2013 0820   AST 27 06/04/2012 1124   ALT 20 01/13/2013 0820   ALT 32 06/04/2012 1124   BILITOT 0.61 01/13/2013 0820   BILITOT 1.1 06/04/2012 1124       STUDIES: Mr Breast Bilateral W Wo Contrast  12/30/2012   *RADIOLOGY REPORT*  Clinical Data: Known right breast grade III invasive ductal carcinoma with lymphovascular invasion and a metastatic right axillary lymph node.  The patient underwent an MR guided core biopsy 8 cm posterior and superior to the original biopsy, which showed DCIS with foci worrisome for invasion.  BILATERAL BREAST MRI WITH AND WITHOUT CONTRAST  Technique: Multiplanar, multisequence MR images  of both breasts were obtained prior to and following the intravenous administration of 15ml of multihance.  Three dimensional images were evaluated at the independent DynaCad workstation.  Comparison:  Mammograms dated 10/23/2012, 10/08/2012, 10/07/2012 and 08/20/2013and prior MRIs dated 10/14/2012 and 10/23/2012.  Findings:  Right breast:  There has been good interval response to the neoadjuvant chemotherapy.  The irregular linear and stippled enhancement throughout the entire upper central right breast is smaller and shows less enhancement.  The enhancement measures  4.0 x 11.0 x 8.0 cm (transverse x AP x CC).  On the prior MRI dated 11/11/2011 it measured 8 x 12 x 7 cm.  Two biopsy clip artifacts located 7 cm apart are seen in the breast.  Left breast:  No abnormal enhancement seen in the left breast.  There has been significant interval reduction in the size of the level I right axillary adenopathy.  Right axillary lymph nodes persist but are not pathologically enlarged.  IMPRESSION: Positive interval response to neoadjuvant chemotherapy with less enhancement in the right breast and reduction in size of the axillary adenopathy.  RECOMMENDATION: Treatment planning.  THREE-DIMENSIONAL MR IMAGE RENDERING ON INDEPENDENT WORKSTATION:  Three-dimensional MR images were rendered by post-processing of the original MR data on an independent workstation.  The three- dimensional MR images were interpreted, and findings were reported in the accompanying complete MRI report for this study.  BI-RADS CATEGORY 6:  Known biopsy-proven malignancy - appropriate action  should be taken.   Original Report Authenticated By: Debra Mcintyre, M.D.    ASSESSMENT: 72 y.o. [place] woman   (1)  status post right breast and right axillary lymph node biopsies February 2014 of a clinical T2, pN1, stage IIB,  invasive ductal carcinoma, grade 3, triple negative, with an MIB-1 of 15%  (2)  a third biopsy from a different quadrant 10/23/2012  showed ductal carcinoma in situ  (3)  currently being treated in the neoadjuvant setting, completed 4 dose dense cycles of doxorubicin/ cyclophosphamide 12/23/2012, followed by 12 weekly doses of paclitaxel and carboplatin started 01/07/2013  (4) family history suggestive of Lynch syndrome; genetics evaluation pending  PLAN:  Debra Mcintyre will proceed to treatment today as scheduled for her third of 12 planned weekly doses of paclitaxel/carboplatin. We'll continue to follow her very closely for any signs of peripheral neuropathy. I will see her again next week for followup on June 24.   If she becomes unable to tolerate the carbo/paclitaxel regimen, we will consider switching to carboplatin/gemcitabine. When she completes her chemotherapy will repeat an MRI and proceed to surgery.    This was all reviewed with Debra Mcintyre today who voices understanding and agreement with our plan, and she noticed called any changes or problems prior to her appointment here next week.  Retaj Hilbun PA-C   01/28/2013

## 2013-01-28 NOTE — Patient Instructions (Addendum)
[facility] Discharge Instructions for Patients Receiving Chemotherapy  Today you received the following chemotherapy agents:  Taxol and Carboplatin  To help prevent nausea and vomiting after your treatment, we encourage you to take your nausea medication as ordered per MD.   If you develop nausea and vomiting that is not controlled by your nausea medication, call the clinic.   BELOW ARE SYMPTOMS THAT SHOULD BE REPORTED IMMEDIATELY:  *FEVER GREATER THAN 100.5 F  *CHILLS WITH OR WITHOUT FEVER  NAUSEA AND VOMITING THAT IS NOT CONTROLLED WITH YOUR NAUSEA MEDICATION  *UNUSUAL SHORTNESS OF BREATH  *UNUSUAL BRUISING OR BLEEDING  TENDERNESS IN MOUTH AND THROAT WITH OR WITHOUT PRESENCE OF ULCERS  *URINARY PROBLEMS  *BOWEL PROBLEMS  UNUSUAL RASH Items with * indicate a potential emergency and should be followed up as soon as possible.  Feel free to call the clinic you have any questions or concerns. The clinic phone number is [phone].    

## 2013-01-28 NOTE — Telephone Encounter (Signed)
Per staff message and POF I have scheduled appts.  JMW  

## 2013-01-28 NOTE — Telephone Encounter (Signed)
appts made and printed...td 

## 2013-01-28 NOTE — Progress Notes (Signed)
Patient accessed with 20G Huber needle.  Unable to flush PAC.  Repositioned needle with same result.   Reaccessed PAC - still unable to flush PAC.  Moved patient in mutiple positions - still unable to flush PAC,  Discussed with Zollie Scale PA.  Will send patient to Interventional radiology to assess [facility] and depending on outcome may either treat through [facility] if issues resolved or may treat peripherally today if unable to resolve PAC issues today.  Walked patient to [facility] IR with friend. Patient returns from IR.  PAC remains accessed. Flushes easily and has excellent blood return.  Labs drawn from [facility]

## 2013-02-03 ENCOUNTER — Other Ambulatory Visit: Payer: Medicare Other | Admitting: Lab

## 2013-02-04 ENCOUNTER — Ambulatory Visit (HOSPITAL_BASED_OUTPATIENT_CLINIC_OR_DEPARTMENT_OTHER): Payer: Medicare Other

## 2013-02-04 ENCOUNTER — Other Ambulatory Visit (HOSPITAL_BASED_OUTPATIENT_CLINIC_OR_DEPARTMENT_OTHER): Payer: Medicare Other | Admitting: Lab

## 2013-02-04 ENCOUNTER — Ambulatory Visit (HOSPITAL_BASED_OUTPATIENT_CLINIC_OR_DEPARTMENT_OTHER): Payer: Medicare Other | Admitting: Physician Assistant

## 2013-02-04 ENCOUNTER — Encounter: Payer: Self-pay | Admitting: Physician Assistant

## 2013-02-04 VITALS — BP 149/82 | HR 89 | Temp 97.9°F | Resp 18

## 2013-02-04 VITALS — BP 82/54 | HR 109 | Temp 98.4°F | Resp 20 | Ht 69.0 in | Wt 162.4 lb

## 2013-02-04 DIAGNOSIS — Z5111 Encounter for antineoplastic chemotherapy: Secondary | ICD-10-CM

## 2013-02-04 DIAGNOSIS — C50111 Malignant neoplasm of central portion of right female breast: Secondary | ICD-10-CM

## 2013-02-04 DIAGNOSIS — D0590 Unspecified type of carcinoma in situ of unspecified breast: Secondary | ICD-10-CM

## 2013-02-04 DIAGNOSIS — E860 Dehydration: Secondary | ICD-10-CM

## 2013-02-04 DIAGNOSIS — C773 Secondary and unspecified malignant neoplasm of axilla and upper limb lymph nodes: Secondary | ICD-10-CM | POA: Diagnosis not present

## 2013-02-04 DIAGNOSIS — C50119 Malignant neoplasm of central portion of unspecified female breast: Secondary | ICD-10-CM | POA: Diagnosis not present

## 2013-02-04 DIAGNOSIS — C50911 Malignant neoplasm of unspecified site of right female breast: Secondary | ICD-10-CM

## 2013-02-04 DIAGNOSIS — R599 Enlarged lymph nodes, unspecified: Secondary | ICD-10-CM

## 2013-02-04 DIAGNOSIS — C50919 Malignant neoplasm of unspecified site of unspecified female breast: Secondary | ICD-10-CM

## 2013-02-04 DIAGNOSIS — I959 Hypotension, unspecified: Secondary | ICD-10-CM

## 2013-02-04 LAB — CBC WITH DIFFERENTIAL/PLATELET
Basophils Absolute: 0 10*3/uL (ref 0.0–0.1)
EOS%: 3.3 % (ref 0.0–7.0)
Eosinophils Absolute: 0.2 10*3/uL (ref 0.0–0.5)
HGB: 11.2 g/dL — ABNORMAL LOW (ref 11.6–15.9)
MCH: 29.6 pg (ref 25.1–34.0)
NEUT#: 3.7 10*3/uL (ref 1.5–6.5)
RDW: 16.9 % — ABNORMAL HIGH (ref 11.2–14.5)
WBC: 4.9 10*3/uL (ref 3.9–10.3)
lymph#: 0.8 10*3/uL — ABNORMAL LOW (ref 0.9–3.3)

## 2013-02-04 MED ORDER — LORAZEPAM 1 MG PO TABS
0.5000 mg | ORAL_TABLET | Freq: Once | ORAL | Status: AC
  Administered 2013-02-04: 0.5 mg via ORAL

## 2013-02-04 MED ORDER — SODIUM CHLORIDE 0.9 % IV SOLN
Freq: Once | INTRAVENOUS | Status: AC
  Administered 2013-02-04: 13:00:00 via INTRAVENOUS

## 2013-02-04 MED ORDER — LORAZEPAM 0.5 MG PO TABS: 0.5000 mg | ORAL_TABLET | Freq: Once | ORAL | Status: DC

## 2013-02-04 MED ORDER — ONDANSETRON 16 MG/50ML IVPB (CHCC): 16.0000 mg | Freq: Once | INTRAVENOUS | Status: DC

## 2013-02-04 MED ORDER — LORAZEPAM 1 MG PO TABS
0.5000 mg | ORAL_TABLET | Freq: Once | ORAL | Status: AC
  Administered 2013-02-04: 0.5 mg via SUBLINGUAL

## 2013-02-04 MED ORDER — SODIUM CHLORIDE 0.9 % IJ SOLN
10.0000 mL | INTRAMUSCULAR | Status: DC | PRN
  Administered 2013-02-04: 10 mL
  Filled 2013-02-04: qty 10

## 2013-02-04 MED ORDER — HEPARIN SOD (PORK) LOCK FLUSH 100 UNIT/ML IV SOLN
500.0000 [IU] | Freq: Once | INTRAVENOUS | Status: AC | PRN
  Administered 2013-02-04: 500 [IU]
  Filled 2013-02-04: qty 5

## 2013-02-04 MED ORDER — DIPHENHYDRAMINE HCL 50 MG/ML IJ SOLN
50.0000 mg | Freq: Once | INTRAMUSCULAR | Status: AC
  Administered 2013-02-04: 25 mg via INTRAVENOUS

## 2013-02-04 MED ORDER — DEXAMETHASONE SODIUM PHOSPHATE 20 MG/5ML IJ SOLN
20.0000 mg | Freq: Once | INTRAMUSCULAR | Status: AC
  Administered 2013-02-04: 20 mg via INTRAVENOUS

## 2013-02-04 MED ORDER — FAMOTIDINE IN NACL 20-0.9 MG/50ML-% IV SOLN
20.0000 mg | Freq: Once | INTRAVENOUS | Status: AC
  Administered 2013-02-04: 20 mg via INTRAVENOUS

## 2013-02-04 MED ORDER — SODIUM CHLORIDE 0.9 % IV SOLN
226.0000 mg | Freq: Once | INTRAVENOUS | Status: AC
  Administered 2013-02-04: 230 mg via INTRAVENOUS
  Filled 2013-02-04: qty 23

## 2013-02-04 MED ORDER — SODIUM CHLORIDE 0.9 % IV SOLN
80.0000 mg/m2 | Freq: Once | INTRAVENOUS | Status: AC
  Administered 2013-02-04: 150 mg via INTRAVENOUS
  Filled 2013-02-04: qty 25

## 2013-02-04 NOTE — Progress Notes (Signed)
ID: Debra Mcintyre   DOB: 1940-09-24  MR#: [id number]  NWG#:[id number]  PCP: [name], MD GYN: [name], [name], [name],   HISTORY OF PRESENT ILLNESS: Roselle had screening mammography at [facility] 04/10/2012 raising the question of some axillary lymph nodes on the right. Additional views of the right axilla and right axillary ultrasound performed 04/04/2012 showed 3 lymph nodes that appeared larger than prior. The largest measured 1.5 cm. 2 of them had cortical thickening. Close followup was suggested, and repeat right axillary ultrasound 06/25/2012 showed no significant change in the lymph nodes in question. Followup ultrasound in 3 months was recommended, but in the interim the patient saw her primary physician, Dr. [name] and ADL, and he set her up for a chest CT on 07/08/2012, which showed a mild asymmetric density in the upper mid right breast. There was a prominent right axillary lymph node measuring 1.9 cm, with a few smaller adjacent nodes asymmetric with compared to the left side. Again, short interval followup was recommended, and on 10/07/2012 the patient had digital right mammography and ultrasonography, now at the breast Center. Dr. [name] noted pleomorphic calcifications spanning an area of 6.3 cm without associated mass. Physical exam was unremarkable. Ultrasound showed an area of adenopathy in the right axilla measuring 1.6 cm, with a second lymph node with a thickened cortex measuring 1.2 cm. No suspicious mass was seen by ultrasound in the right breast.  Biopsy of the larger axillary lymph node on 10/07/2012 showed ([id number]) an invasive ductal carcinoma, triple negative, with an MIB-1 of 66%. Biopsy of the right breast the next day, [id number]) showed invasive ductal carcinoma, grade 3. Breast MRI 10/14/2012 showed a total area of irregular enhancement in the right breast measuring up to 12 cm. MRI guided biopsy of an  area in the upper inner quadrant of the right breast on 10/23/2012 showed ([id number]) ductal carcinoma in situ, with foci worrisome for invasion.  The patient's subsequent history is as detailed below  INTERVAL HISTORY: Charlye returns today accompanied by her friend, [name], for followup of her locally advanced right breast cancer.  She is due for her 4th of 12 planned weekly doses of carboplatin/paclitaxel being given in the neoadjuvant setting.    Melea is actually feeling very well with no new complaints today. Fortunately, she's had no additional signs of peripheral neuropathy in either the upper or lower extremities. She's had no additional skin changes, no cracking, and no peeling. In fact, the only issue today is some mild hypertension. She has not yet taken her blood pressure medications today, but also admits that she feels very thirsty and has had very little to drink this morning. She denies any dizziness, and has a good energy level. In fact she walked her dog this morning prior to coming  for treatment.   REVIEW OF SYSTEMS: Sueann has had no fevers or chills. She denies  bruising or abnormal bleeding. Her appetite is fairly good although she does have some mild taste aversion.  She's had no nausea or  emesis. She tends to have some mild constipation which is treated effectively with Colace and MiraLAX as needed. She continues to take Pepcid for reflux which is helpful. She has mild shortness of breath with exertion alone, but denies any orthopnea. She denies any new cough, phlegm production,or chest pain. She denies any abnormal headaches or dizziness. she's had no peripheral swelling. Currently, she denies any unusual myalgias or  arthralgias, but does tend to develop bony pain following treatment for which she takes occasional dose of oxycodone.  A detailed review of systems is otherwise stable and noncontributory.   PAST MEDICAL HISTORY: Past Medical History  Diagnosis Date  .  Hypertension   . Cerebrovascular disease, unspecified   . Cerebral aneurysm, nonruptured   . Pure hypercholesterolemia   . Hypothyroidism   . Colonic polyp   . Breast cancer 09/2012    right breast/right axillary lymph node t2,pn1, stage 11b, invasive ductal carcinma, grade 3, triple negative, with an mib-1 of 15%  . History of chemotherapy     PAST SURGICAL HISTORY: Past Surgical History  Procedure Laterality Date  . Left middle cerebral artery angioplasty  2004    by [name] had 2 follow up occurances  . Portacath placement N/A 11/05/2012    Procedure: INSERTION PORT-A-CATH;  Surgeon: [name], MD;  Location: [facility];  Service: General;  Laterality: N/A;  . Breast biopsy Right 10/23/2012  . Breast biopsy Right 10/08/2012    FAMILY HISTORY Family History  Problem Relation Age of Onset  . Lung cancer Mother 77  . Heart disease Father   . Colon cancer Brother 67  . Alcohol abuse Brother   . Colon cancer Maternal Uncle     dx in his 75s  . Cancer Maternal Aunt     unknown type  . Ovarian cancer Other 71   the patient's father died at the age of 71. The patient's mother died at the age of 47 from lung cancer. She was not a smoker. The cancer was diagnosed when she was 70 years old. The patient's mother had 2 sisters and one brother. One of those sisters had lung cancer and a brother had colon cancer, both diagnosed in their 1s. The patient herself has one brother who died from colon cancer at the age of 38. That brother has a daughter who was diagnosed with uterine cancer at the age of 21. All this raises the question of possible Lynch syndrome and I have referred the patient for genetics counseling.  GYNECOLOGIC HISTORY: Menarche age 72, menopause around 55. The patient is GX P0. She took birth control pills for many years with no complications.  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 [name] who lives in "[place]" and is financial vice president of [employer], and Redstone who lives in [place] and works as a Transport planner. The patient has 5 grandchildren. She is currently not a church attender   ADVANCED DIRECTIVES: Not in place; this was discussed 10/29/2012 and the patient was advised to complete a healthcare power of attorney document  HEALTH MAINTENANCE: History  Substance Use Topics  . Smoking status: Former Smoker -- 1.00 packs/day for 20 years    Types: Cigarettes    Quit date: 04/02/1986  . Smokeless tobacco: Never Used  . Alcohol Use: No     Colonoscopy: 2012  PAP: 2012  Bone density:  Lipid panel:  No Known Allergies  Current Outpatient Prescriptions  Medication Sig Dispense Refill  . acyclovir (ZOVIRAX) 400 MG tablet Take 1 tablet (400 mg total) by mouth 2 (two) times daily.  60 tablet  2  . Alum & Mag Hydroxide-Simeth (MAGIC MOUTHWASH W/LIDOCAINE) SOLN Take 5 mLs by mouth 3 (three) times daily as needed.  100 mL  1  . aspirin 325  MG tablet Take 325 mg by mouth daily.        . Calcium Carbonate-Vitamin D (CALCIUM 600+D) 600-200 MG-UNIT TABS Take 1 tablet by mouth 2 (two) times daily.        . ciprofloxacin (CIPRO) 500 MG tablet Take 1 tablet (500 mg total) by mouth 2 (two) times daily.  14 tablet  3  . clopidogrel (PLAVIX) 75 MG tablet TAKE ONE TABLET BY MOUTH EVERY DAY  30 tablet  6  . dexamethasone (DECADRON) 4 MG tablet       . famotidine (PEPCID) 40 MG tablet Take 1 tablet (40 mg total) by mouth daily.  30 tablet  3  . fluconazole (DIFLUCAN) 100 MG tablet Take 1 tablet (100 mg total) by mouth daily.  7 tablet  3  . levothyroxine (SYNTHROID, LEVOTHROID) 125 MCG tablet Take 1 tablet (125 mcg total) by mouth daily.  30 tablet  11  . lidocaine-prilocaine (EMLA) cream Apply topically as needed. Apply over port 1-2 hors before chemo and cover with plastic wrap  30 g  0  . loratadine  (CLARITIN) 10 MG tablet Take 10 mg by mouth daily.      Marland Kitchen LORazepam (ATIVAN) 0.5 MG tablet Take 1 tablet (0.5 mg total) by mouth at bedtime as needed for anxiety (Nausea or vomiting).  30 tablet  0  . metroNIDAZOLE (METROCREAM) 0.75 % cream       . Multiple Vitamin (MULTIVITAMIN) capsule Take 1 capsule by mouth daily.        . Naproxen Sodium (ALEVE) 220 MG CAPS Take 1 capsule by mouth as needed.      . ondansetron (ZOFRAN) 8 MG tablet 1 tab by mouth twice daily x 3 days after chemo, then every 12 hours if needed for nausea  30 tablet  1  . oxyCODONE-acetaminophen (ROXICET) 5-325 MG per tablet Take 1 tablet by mouth every 6 (six) hours as needed for pain.  60 tablet  0  . prochlorperazine (COMPAZINE) 10 MG tablet       . rosuvastatin (CRESTOR) 20 MG tablet Take 1 tablet (20 mg total) by mouth daily.  30 tablet  11  . silver sulfADIAZINE (SILVADENE) 1 % cream       . venlafaxine XR (EFFEXOR-XR) 75 MG 24 hr capsule Take 1 capsule (75 mg total) by mouth daily.  30 capsule  6  . cephALEXin (KEFLEX) 500 MG capsule Take 1 capsule (500 mg total) by mouth 3 (three) times daily.  21 capsule  0  . lisinopril-hydrochlorothiazide (PRINZIDE) 20-12.5 MG per tablet Take 1 tablet by mouth daily.  30 tablet  11  . metoprolol succinate (TOPROL-XL) 100 MG 24 hr tablet TAKE ONE TABLET BY MOUTH EVERY DAY  30 tablet  11   No current facility-administered medications for this visit.    OBJECTIVE: Middle-aged white woman who appears comfortable and is in no acute distress Filed Vitals:   02/04/13 1126  BP: 82/54  Pulse:   Temp:   Resp:      Body mass index is 23.97 kg/(m^2).    ECOG FS: 1 Filed Weights   02/04/13 1042  Weight: 162 lb 6.4 oz (73.664 kg)   Sclerae unicteric Oropharynx clear, no evidence of ulcerations or thrush, buccal mucosa is pink, slightly dry No cervical or supraclavicular adenopathy; Lungs clear to auscultation bilaterally, no crackles, wheezes, or rhonchi Heart regular rate and  rhythm, no murmur Abdomen soft, nontender, positive bowel sounds MSK no focal spinal tenderness, No evidence of  PPE  No peripheral edema Neuro: nonfocal, well oriented, pleasant affect Breasts: Deferred. Axillae are benign bilaterally palpable adenopathy. Port is intact in the left upper chest wall with no erythema, edema, or evidence of infection.    LAB RESULTS: Lab Results  Component Value Date   WBC 4.9 02/04/2013   NEUTROABS 3.7 02/04/2013   HGB 11.2* 02/04/2013   HCT 33.5* 02/04/2013   MCV 88.6 02/04/2013   PLT 176 02/04/2013      Chemistry      Component Value Date/Time   NA 138 01/28/2013 0909   NA 138 06/04/2012 1124   K 3.7 01/28/2013 0909   K 4.3 06/04/2012 1124   CL 102 01/28/2013 0909   CL 103 06/04/2012 1124   CO2 23 01/28/2013 0909   CO2 29 06/04/2012 1124   BUN 14.3 01/28/2013 0909   BUN 14 06/04/2012 1124   CREATININE 0.7 01/28/2013 0909   CREATININE 0.7 06/04/2012 1124      Component Value Date/Time   CALCIUM 9.8 01/28/2013 0909   CALCIUM 9.9 06/04/2012 1124   ALKPHOS 65 01/28/2013 0909   ALKPHOS 47 06/04/2012 1124   AST 22 01/28/2013 0909   AST 27 06/04/2012 1124   ALT 19 01/28/2013 0909   ALT 32 06/04/2012 1124   BILITOT 1.07 01/28/2013 0909   BILITOT 1.1 06/04/2012 1124       STUDIES: Mr Breast Bilateral W Wo Contrast  12/30/2012   *RADIOLOGY REPORT*  Clinical Data: Known right breast grade III invasive ductal carcinoma with lymphovascular invasion and a metastatic right axillary lymph node.  The patient underwent an MR guided core biopsy 8 cm posterior and superior to the original biopsy, which showed DCIS with foci worrisome for invasion.  BILATERAL BREAST MRI WITH AND WITHOUT CONTRAST  Technique: Multiplanar, multisequence MR images of both breasts were obtained prior to and following the intravenous administration of 15ml of multihance.  Three dimensional images were evaluated at the independent DynaCad workstation.  Comparison:  Mammograms dated  10/23/2012, 10/08/2012, 10/07/2012 and 08/20/2013and prior MRIs dated 10/14/2012 and 10/23/2012.  Findings:  Right breast:  There has been good interval response to the neoadjuvant chemotherapy.  The irregular linear and stippled enhancement throughout the entire upper central right breast is smaller and shows less enhancement.  The enhancement measures  4.0 x 11.0 x 8.0 cm (transverse x AP x CC).  On the prior MRI dated 11/11/2011 it measured 8 x 12 x 7 cm.  Two biopsy clip artifacts located 7 cm apart are seen in the breast.  Left breast:  No abnormal enhancement seen in the left breast.  There has been significant interval reduction in the size of the level I right axillary adenopathy.  Right axillary lymph nodes persist but are not pathologically enlarged.  IMPRESSION: Positive interval response to neoadjuvant chemotherapy with less enhancement in the right breast and reduction in size of the axillary adenopathy.  RECOMMENDATION: Treatment planning.  THREE-DIMENSIONAL MR IMAGE RENDERING ON INDEPENDENT WORKSTATION:  Three-dimensional MR images were rendered by post-processing of the original MR data on an independent workstation.  The three- dimensional MR images were interpreted, and findings were reported in the accompanying complete MRI report for this study.  BI-RADS CATEGORY 6:  Known biopsy-proven malignancy - appropriate action should be taken.   Original Report Authenticated By: [name], M.D.    ASSESSMENT: 71 y.o. [place] woman   (1)  status post right breast and right axillary lymph node biopsies February 2014 of a clinical T2,  pN1, stage IIB,  invasive ductal carcinoma, grade 3, triple negative, with an MIB-1 of 15%  (2)  a third biopsy from a different quadrant 10/23/2012 showed ductal carcinoma in situ  (3)  currently being treated in the neoadjuvant setting, completed 4 dose dense cycles of doxorubicin/ cyclophosphamide 12/23/2012, followed by 12 weekly doses of paclitaxel and  carboplatin started 01/07/2013  (4) family history suggestive of Lynch syndrome; genetics evaluation pending  PLAN:  Elaura will proceed to treatment today as scheduled for her 4th of 12 planned weekly doses of paclitaxel/carboplatin. He'll continue to monitor her blood pressure closely in the infusion room, and I have added 1 L of normal saline to be given IV during treatment for dehydration/hypotension. I have advised her to hold her hypertensive medications (lisinopril/hydrochlorothiazide and metoprolol) until her blood pressure is greater than 130/80. She does have a blood pressure monitor her home, and will keep [place] informed of the readings.  I did encourage her to keep herself well hydrated, and she voiced understanding.  I will mention that Hindy tends to get very restless with the IV Benadryl, so I have added an order for sublingual lorazepam with her premeds, 0.5 mg, to be repeated x1 if needed.   I will see the patient again next week on July 1 in anticipation of her fifth weekly dose of chemotherapy. We will continue to follow her very closely, especially for any recurrence of peripheral neuropathy. If she becomes unable to tolerate the carbo/paclitaxel regimen, we will consider switching to carboplatin/gemcitabine. When she completes her chemotherapy will repeat an MRI and proceed to surgery.    This was all reviewed with Kameko today who voices understanding and agreement with our plan, and she noticed called any changes or problems prior to her appointment here next week.  Jhayden Demuro PA-C   02/04/2013

## 2013-02-07 ENCOUNTER — Encounter: Payer: Self-pay | Admitting: Oncology

## 2013-02-10 ENCOUNTER — Other Ambulatory Visit: Payer: Medicare Other | Admitting: Lab

## 2013-02-11 ENCOUNTER — Telehealth: Payer: Self-pay | Admitting: *Deleted

## 2013-02-11 ENCOUNTER — Other Ambulatory Visit (HOSPITAL_BASED_OUTPATIENT_CLINIC_OR_DEPARTMENT_OTHER): Payer: Medicare Other | Admitting: Lab

## 2013-02-11 ENCOUNTER — Encounter: Payer: Self-pay | Admitting: Physician Assistant

## 2013-02-11 ENCOUNTER — Ambulatory Visit (HOSPITAL_BASED_OUTPATIENT_CLINIC_OR_DEPARTMENT_OTHER): Payer: Medicare Other | Admitting: Physician Assistant

## 2013-02-11 ENCOUNTER — Ambulatory Visit (HOSPITAL_BASED_OUTPATIENT_CLINIC_OR_DEPARTMENT_OTHER): Payer: Medicare Other

## 2013-02-11 VITALS — BP 97/60 | HR 102 | Temp 98.4°F | Resp 20 | Ht 69.0 in | Wt 161.0 lb

## 2013-02-11 VITALS — BP 150/69 | HR 83 | Temp 98.2°F | Resp 18

## 2013-02-11 DIAGNOSIS — I10 Essential (primary) hypertension: Secondary | ICD-10-CM

## 2013-02-11 DIAGNOSIS — C50119 Malignant neoplasm of central portion of unspecified female breast: Secondary | ICD-10-CM

## 2013-02-11 DIAGNOSIS — C773 Secondary and unspecified malignant neoplasm of axilla and upper limb lymph nodes: Secondary | ICD-10-CM | POA: Diagnosis not present

## 2013-02-11 DIAGNOSIS — C50111 Malignant neoplasm of central portion of right female breast: Secondary | ICD-10-CM

## 2013-02-11 DIAGNOSIS — C50911 Malignant neoplasm of unspecified site of right female breast: Secondary | ICD-10-CM

## 2013-02-11 DIAGNOSIS — R599 Enlarged lymph nodes, unspecified: Secondary | ICD-10-CM

## 2013-02-11 DIAGNOSIS — G5690 Unspecified mononeuropathy of unspecified upper limb: Secondary | ICD-10-CM | POA: Diagnosis not present

## 2013-02-11 DIAGNOSIS — C50919 Malignant neoplasm of unspecified site of unspecified female breast: Secondary | ICD-10-CM

## 2013-02-11 DIAGNOSIS — Z5111 Encounter for antineoplastic chemotherapy: Secondary | ICD-10-CM | POA: Diagnosis not present

## 2013-02-11 LAB — COMPREHENSIVE METABOLIC PANEL (CC13)
ALT: 27 U/L (ref 0–55)
AST: 30 U/L (ref 5–34)
Albumin: 3.5 g/dL (ref 3.5–5.0)
Alkaline Phosphatase: 52 U/L (ref 40–150)
BUN: 15.9 mg/dL (ref 7.0–26.0)
CO2: 24 meq/L (ref 22–29)
Calcium: 9.5 mg/dL (ref 8.4–10.4)
Chloride: 102 meq/L (ref 98–109)
Creatinine: 0.7 mg/dL (ref 0.6–1.1)
Glucose: 111 mg/dL (ref 70–140)
Potassium: 3.7 meq/L (ref 3.5–5.1)
Sodium: 134 meq/L — ABNORMAL LOW (ref 136–145)
Total Bilirubin: 0.82 mg/dL (ref 0.20–1.20)
Total Protein: 6.7 g/dL (ref 6.4–8.3)

## 2013-02-11 LAB — CBC WITH DIFFERENTIAL/PLATELET
Basophils Absolute: 0 10*3/uL (ref 0.0–0.1)
EOS%: 2.7 % (ref 0.0–7.0)
Eosinophils Absolute: 0.1 10*3/uL (ref 0.0–0.5)
HGB: 10.9 g/dL — ABNORMAL LOW (ref 11.6–15.9)
LYMPH%: 28.4 % (ref 14.0–49.7)
MCH: 29.5 pg (ref 25.1–34.0)
MCV: 87.5 fL (ref 79.5–101.0)
MONO%: 9 % (ref 0.0–14.0)
NEUT%: 58.5 % (ref 38.4–76.8)
Platelets: ADEQUATE 10*3/uL (ref 145–400)
RDW: 16.9 % — ABNORMAL HIGH (ref 11.2–14.5)

## 2013-02-11 MED ORDER — LORAZEPAM 1 MG PO TABS
0.5000 mg | ORAL_TABLET | Freq: Once | ORAL | Status: AC
  Administered 2013-02-11: 0.5 mg via ORAL

## 2013-02-11 MED ORDER — CARBOPLATIN CHEMO INJECTION 450 MG/45ML
226.0000 mg | Freq: Once | INTRAVENOUS | Status: AC
  Administered 2013-02-11: 230 mg via INTRAVENOUS
  Filled 2013-02-11: qty 23

## 2013-02-11 MED ORDER — DIPHENHYDRAMINE HCL 50 MG/ML IJ SOLN
50.0000 mg | Freq: Once | INTRAMUSCULAR | Status: AC
  Administered 2013-02-11: 50 mg via INTRAVENOUS

## 2013-02-11 MED ORDER — DEXAMETHASONE SODIUM PHOSPHATE 20 MG/5ML IJ SOLN
20.0000 mg | Freq: Once | INTRAMUSCULAR | Status: AC
  Administered 2013-02-11: 20 mg via INTRAVENOUS

## 2013-02-11 MED ORDER — FAMOTIDINE IN NACL 20-0.9 MG/50ML-% IV SOLN
20.0000 mg | Freq: Once | INTRAVENOUS | Status: AC
  Administered 2013-02-11: 20 mg via INTRAVENOUS

## 2013-02-11 MED ORDER — HEPARIN SOD (PORK) LOCK FLUSH 100 UNIT/ML IV SOLN
500.0000 [IU] | Freq: Once | INTRAVENOUS | Status: AC | PRN
  Administered 2013-02-11: 500 [IU]
  Filled 2013-02-11: qty 5

## 2013-02-11 MED ORDER — SODIUM CHLORIDE 0.9 % IV SOLN
80.0000 mg/m2 | Freq: Once | INTRAVENOUS | Status: AC
  Administered 2013-02-11: 150 mg via INTRAVENOUS
  Filled 2013-02-11: qty 25

## 2013-02-11 MED ORDER — SODIUM CHLORIDE 0.9 % IJ SOLN
10.0000 mL | INTRAMUSCULAR | Status: DC | PRN
  Administered 2013-02-11: 10 mL
  Filled 2013-02-11: qty 10

## 2013-02-11 MED ORDER — LORAZEPAM 1 MG PO TABS
0.5000 mg | ORAL_TABLET | Freq: Once | ORAL | Status: AC
  Administered 2013-02-11: 0.5 mg via SUBLINGUAL

## 2013-02-11 MED ORDER — ONDANSETRON 16 MG/50ML IVPB (CHCC)
16.0000 mg | Freq: Once | INTRAVENOUS | Status: AC
  Administered 2013-02-11: 16 mg via INTRAVENOUS

## 2013-02-11 NOTE — Progress Notes (Signed)
ID: Debra Mcintyre   DOB: [birth year]  MR#: [id number]  XLK#:[id number]  PCP: [name], MD GYN: [name], [name], [name],   HISTORY OF PRESENT ILLNESS: Debra Mcintyre had screening mammography at [facility] 04/10/2012 raising the question of some axillary lymph nodes on the right. Additional views of the right axilla and right axillary ultrasound performed 04/04/2012 showed 3 lymph nodes that appeared larger than prior. The largest measured 1.5 cm. 2 of them had cortical thickening. Close followup was suggested, and repeat right axillary ultrasound 06/25/2012 showed no significant change in the lymph nodes in question. Followup ultrasound in 3 months was recommended, but in the interim the patient saw her primary physician, Dr. [name] and ADL, and he set her up for a chest CT on 07/08/2012, which showed a mild asymmetric density in the upper mid right breast. There was a prominent right axillary lymph node measuring 1.9 cm, with a few smaller adjacent nodes asymmetric with compared to the left side. Again, short interval followup was recommended, and on 10/07/2012 the patient had digital right mammography and ultrasonography, now at the breast Center. Dr. [name] noted pleomorphic calcifications spanning an area of 6.3 cm without associated mass. Physical exam was unremarkable. Ultrasound showed an area of adenopathy in the right axilla measuring 1.6 cm, with a second lymph node with a thickened cortex measuring 1.2 cm. No suspicious mass was seen by ultrasound in the right breast.  Biopsy of the larger axillary lymph node on 10/07/2012 showed ([id number]) an invasive ductal carcinoma, triple negative, with an MIB-1 of 66%. Biopsy of the right breast the next day, [id number]) showed invasive ductal carcinoma, grade 3. Breast MRI 10/14/2012 showed a total area of irregular enhancement in the right breast measuring up to 12 cm. MRI guided biopsy of an  area in the upper inner quadrant of the right breast on 10/23/2012 showed ([id number]) ductal carcinoma in situ, with foci worrisome for invasion.  The patient's subsequent history is as detailed below  INTERVAL HISTORY: Debra Mcintyre returns alone today for followup of her locally advanced right breast cancer.  She is due for her 5th of 12 planned weekly doses of carboplatin/paclitaxel being given in the neoadjuvant setting.    Debra Mcintyre is tolerating treatment reasonably well, with only a few concerns today. Her biggest complaint is taste aversion and a decreased appetite, but fortunately she has had no nausea. Her blood pressure continues to fluctuate, and she is monitoring this closely at home. She has some occasional dizziness when she first stands up. She denies any abnormal headaches.  Debra Mcintyre continues to have stable numbness and tingling in the tips of her fingers and tips of her toes. She tells me this is stable, and has not worsened at all since her treatment one week ago. It is not affecting any of her day-to-day activities or fine motor skills.   REVIEW OF SYSTEMS: Debra Mcintyre has had no fevers or chills. She denies  bruising or abnormal bleeding.  She tends to have some mild constipation which is treated effectively with Colace and MiraLAX as needed. She continues to take Pepcid for reflux which is helpful. She has shortness of breath with exertion, but denies any orthopnea. She denies any new cough, phlegm production,or chest pain. She's had no peripheral swelling. Currently, she denies any unusual myalgias or arthralgias, but does tend to develop bony pain following treatment for which she takes occasional dose of oxycodone.  A detailed review of  systems is otherwise stable and noncontributory.   PAST MEDICAL HISTORY: Past Medical History  Diagnosis Date  . Hypertension   . Cerebrovascular disease, unspecified   . Cerebral aneurysm, nonruptured   . Pure hypercholesterolemia   . Hypothyroidism    . Colonic polyp   . Breast cancer 09/2012    right breast/right axillary lymph node t2,pn1, stage 11b, invasive ductal carcinma, grade 3, triple negative, with an mib-1 of 15%  . History of chemotherapy     PAST SURGICAL HISTORY: Past Surgical History  Procedure Laterality Date  . Left middle cerebral artery angioplasty  2004    by [name] had 2 follow up occurances  . Portacath placement N/A 11/05/2012    Procedure: INSERTION PORT-A-CATH;  Surgeon: [name], MD;  Location: [facility];  Service: General;  Laterality: N/A;  . Breast biopsy Right 10/23/2012  . Breast biopsy Right 10/08/2012    FAMILY HISTORY Family History  Problem Relation Age of Onset  . Lung cancer Mother 29  . Heart disease Father   . Colon cancer Brother 48  . Alcohol abuse Brother   . Colon cancer Maternal Uncle     dx in his 79s  . Cancer Maternal Aunt     unknown type  . Ovarian cancer Other 31   the patient's father died at the age of 74. The patient's mother died at the age of 75 from lung cancer. She was not a smoker. The cancer was diagnosed when she was 70 years old. The patient's mother had 2 sisters and one brother. One of those sisters had lung cancer and a brother had colon cancer, both diagnosed in their 19s. The patient herself has one brother who died from colon cancer at the age of 20. That brother has a daughter who was diagnosed with uterine cancer at the age of 67. All this raises the question of possible Lynch syndrome and I have referred the patient for genetics counseling.  GYNECOLOGIC HISTORY: Menarche age 72, menopause around 72. The patient is GX P0. She took birth control pills for many years with no complications.  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 [name] who lives in "[place]" and is financial vice president of [employer], and Rosalia who lives in [place] and works as a Transport planner. The patient has 5 grandchildren. She is currently not a church attender   ADVANCED DIRECTIVES: Not in place; this was discussed 10/29/2012 and the patient was advised to complete a healthcare power of attorney document  HEALTH MAINTENANCE: History  Substance Use Topics  . Smoking status: Former Smoker -- 1.00 packs/day for 20 years    Types: Cigarettes    Quit date: 04/02/1986  . Smokeless tobacco: Never Used  . Alcohol Use: No     Colonoscopy: 2012  PAP: 2012  Bone density:  Lipid panel:  No Known Allergies  Current Outpatient Prescriptions  Medication Sig Dispense Refill  . acyclovir (ZOVIRAX) 400 MG tablet Take 1 tablet (400 mg total) by mouth 2 (two) times daily.  60 tablet  2  . Alum & Mag Hydroxide-Simeth (MAGIC MOUTHWASH W/LIDOCAINE) SOLN Take 5 mLs by mouth 3 (three) times daily as needed.  100 mL  1  . aspirin 325 MG tablet Take 325 mg by mouth daily.        . Calcium Carbonate-Vitamin D (CALCIUM 600+D) 600-200 MG-UNIT  TABS Take 1 tablet by mouth 2 (two) times daily.        . cephALEXin (KEFLEX) 500 MG capsule Take 1 capsule (500 mg total) by mouth 3 (three) times daily.  21 capsule  0  . ciprofloxacin (CIPRO) 500 MG tablet Take 1 tablet (500 mg total) by mouth 2 (two) times daily.  14 tablet  3  . clopidogrel (PLAVIX) 75 MG tablet TAKE ONE TABLET BY MOUTH EVERY DAY  30 tablet  6  . dexamethasone (DECADRON) 4 MG tablet       . famotidine (PEPCID) 40 MG tablet Take 1 tablet (40 mg total) by mouth daily.  30 tablet  3  . fluconazole (DIFLUCAN) 100 MG tablet Take 1 tablet (100 mg total) by mouth daily.  7 tablet  3  . levothyroxine (SYNTHROID, LEVOTHROID) 125 MCG tablet Take 1 tablet (125 mcg total) by mouth daily.  30 tablet  11  . lidocaine-prilocaine (EMLA) cream Apply topically as needed. Apply over port 1-2 hors before chemo and cover with plastic wrap  30 g  0  . loratadine (CLARITIN) 10 MG tablet Take  10 mg by mouth daily.      Marland Kitchen LORazepam (ATIVAN) 0.5 MG tablet Take 1 tablet (0.5 mg total) by mouth at bedtime as needed for anxiety (Nausea or vomiting).  30 tablet  0  . LORazepam (ATIVAN) 0.5 MG tablet Take 1 tablet (0.5 mg total) by mouth once.  30 tablet  0  . metoprolol succinate (TOPROL-XL) 100 MG 24 hr tablet TAKE ONE TABLET BY MOUTH EVERY DAY  30 tablet  11  . metroNIDAZOLE (METROCREAM) 0.75 % cream       . Multiple Vitamin (MULTIVITAMIN) capsule Take 1 capsule by mouth daily.        . Naproxen Sodium (ALEVE) 220 MG CAPS Take 1 capsule by mouth as needed.      . ondansetron (ZOFRAN) 8 MG tablet 1 tab by mouth twice daily x 3 days after chemo, then every 12 hours if needed for nausea  30 tablet  1  . oxyCODONE-acetaminophen (ROXICET) 5-325 MG per tablet Take 1 tablet by mouth every 6 (six) hours as needed for pain.  60 tablet  0  . prochlorperazine (COMPAZINE) 10 MG tablet       . rosuvastatin (CRESTOR) 20 MG tablet Take 1 tablet (20 mg total) by mouth daily.  30 tablet  11  . silver sulfADIAZINE (SILVADENE) 1 % cream       . venlafaxine XR (EFFEXOR-XR) 75 MG 24 hr capsule Take 1 capsule (75 mg total) by mouth daily.  30 capsule  6  . lisinopril-hydrochlorothiazide (PRINZIDE) 20-12.5 MG per tablet Take 1 tablet by mouth daily.  30 tablet  11   No current facility-administered medications for this visit.   Facility-Administered Medications Ordered in Other Visits  Medication Dose Route Frequency Provider Last Rate Last Dose  . sodium chloride 0.9 % injection 10 mL  10 mL Intracatheter PRN Catalina Gravel, PA-C   10 mL at 02/11/13 1418    OBJECTIVE: Middle-aged white woman who appears comfortable and is in no acute distress Filed Vitals:   02/11/13 1107  BP: 97/60  Pulse: 102  Temp: 98.4 F (36.9 C)  Resp: 20     Body mass index is 23.76 kg/(m^2).    ECOG FS: 1 Filed Weights   02/11/13 1107  Weight: 161 lb (73.029 kg)   Sclerae unicteric Oropharynx clear, no evidence of  ulcerations or thrush.  No cervical or supraclavicular adenopathy; Lungs clear to auscultation bilaterally, no crackles, wheezes, or rhonchi Heart regular rate and rhythm, no murmur appreciated Abdomen soft, nontender, positive bowel sounds MSK no focal spinal tenderness, No evidence of PPE  No peripheral edema Neuro: nonfocal, well oriented, pleasant affect Breasts:  Was unable to palpate any abnormalities in the right breast. Left breast is unremarkable. Axillae are benign bilaterally, with no palpable adenopathy. Port is intact in the left upper chest wall with no erythema, edema, or evidence of infection.    LAB RESULTS: Lab Results  Component Value Date   WBC 2.2* 02/11/2013   NEUTROABS 1.3* 02/11/2013   HGB 10.9* 02/11/2013   HCT 32.3* 02/11/2013   MCV 87.5 02/11/2013   PLT Clumped Platelets--Appears Adequate 02/11/2013      Chemistry      Component Value Date/Time   NA 134* 02/11/2013 1044   NA 138 06/04/2012 1124   K 3.7 02/11/2013 1044   K 4.3 06/04/2012 1124   CL 102 01/28/2013 0909   CL 103 06/04/2012 1124   CO2 24 02/11/2013 1044   CO2 29 06/04/2012 1124   BUN 15.9 02/11/2013 1044   BUN 14 06/04/2012 1124   CREATININE 0.7 02/11/2013 1044   CREATININE 0.7 06/04/2012 1124      Component Value Date/Time   CALCIUM 9.5 02/11/2013 1044   CALCIUM 9.9 06/04/2012 1124   ALKPHOS 52 02/11/2013 1044   ALKPHOS 47 06/04/2012 1124   AST 30 02/11/2013 1044   AST 27 06/04/2012 1124   ALT 27 02/11/2013 1044   ALT 32 06/04/2012 1124   BILITOT 0.82 02/11/2013 1044   BILITOT 1.1 06/04/2012 1124       STUDIES: Mr Breast Bilateral W Wo Contrast  12/30/2012   *RADIOLOGY REPORT*  Clinical Data: Known right breast grade III invasive ductal carcinoma with lymphovascular invasion and a metastatic right axillary lymph node.  The patient underwent an MR guided core biopsy 8 cm posterior and superior to the original biopsy, which showed DCIS with foci worrisome for invasion.  BILATERAL BREAST MRI WITH AND  WITHOUT CONTRAST  Technique: Multiplanar, multisequence MR images of both breasts were obtained prior to and following the intravenous administration of 15ml of multihance.  Three dimensional images were evaluated at the independent DynaCad workstation.  Comparison:  Mammograms dated 10/23/2012, 10/08/2012, 10/07/2012 and 08/20/2013and prior MRIs dated 10/14/2012 and 10/23/2012.  Findings:  Right breast:  There has been good interval response to the neoadjuvant chemotherapy.  The irregular linear and stippled enhancement throughout the entire upper central right breast is smaller and shows less enhancement.  The enhancement measures  4.0 x 11.0 x 8.0 cm (transverse x AP x CC).  On the prior MRI dated 11/11/2011 it measured 8 x 12 x 7 cm.  Two biopsy clip artifacts located 7 cm apart are seen in the breast.  Left breast:  No abnormal enhancement seen in the left breast.  There has been significant interval reduction in the size of the level I right axillary adenopathy.  Right axillary lymph nodes persist but are not pathologically enlarged.  IMPRESSION: Positive interval response to neoadjuvant chemotherapy with less enhancement in the right breast and reduction in size of the axillary adenopathy.  RECOMMENDATION: Treatment planning.  THREE-DIMENSIONAL MR IMAGE RENDERING ON INDEPENDENT WORKSTATION:  Three-dimensional MR images were rendered by post-processing of the original MR data on an independent workstation.  The three- dimensional MR images were interpreted, and findings were reported in the accompanying complete MRI  report for this study.  BI-RADS CATEGORY 6:  Known biopsy-proven malignancy - appropriate action should be taken.   Original Report Authenticated By: [name], M.D.    ASSESSMENT: 72 y.o. [place] woman   (1)  status post right breast and right axillary lymph node biopsies February 2014 of a clinical T2, pN1, stage IIB,  invasive ductal carcinoma, grade 3, triple negative, with an MIB-1 of  15%  (2)  a third biopsy from a different quadrant 10/23/2012 showed ductal carcinoma in situ  (3)  currently being treated in the neoadjuvant setting, completed 4 dose dense cycles of doxorubicin/ cyclophosphamide 12/23/2012, followed by 12 weekly doses of paclitaxel and carboplatin started 01/07/2013  (4) family history suggestive of Lynch syndrome; genetics evaluation pending  PLAN:  Brenlynn will proceed to treatment today as scheduled for her 5th of 12 planned weekly doses of paclitaxel/carboplatin.  She'll continue to monitor her fingertips and toes very closely for any increase in the mild neuropathy she is currently experiencing, and we will continue to follow this closely as well.   If she becomes unable to tolerate the carbo/paclitaxel regimen, we will consider switching to carboplatin/gemcitabine.   She's also keeping a close eye on her blood pressure at home. I am also going to recommend that she return to see Dr. Kriste Basque for further evaluation and control of her blood pressure, which continues to fluctuate quite a bit.  (It was 97/60 when she first presented to the office today. She drank some water and ate a ""ZONE bar", and during infusion, her blood pressure was 150/69.)  Janasia will return next week on July 7 for followup with Dr. Darnelle Catalan anticipation of her sixth weekly dose of carboplatin/paclitaxel. We'll continue to see her on a weekly basis, and she scheduled out through mid August.  When she completes her chemotherapy will repeat an MRI and proceed to surgery.    This was all reviewed with Anilah today who voices understanding and agreement with our plan, and she noticed called any changes or problems prior to her appointment here next week.  I will mention that Luise tends to get very restless with the IV Benadryl, so I have added an order for sublingual lorazepam with her premeds, 0.5 mg, to be repeated x1 if needed.    Jarvis Sawa PA-C   02/11/2013

## 2013-02-11 NOTE — Patient Instructions (Addendum)
[facility] Discharge Instructions for Patients Receiving Chemotherapy  Today you received the following chemotherapy agents: Taxol, Carboplatin.  To help prevent nausea and vomiting after your treatment, we encourage you to take your nausea medication.   If you develop nausea and vomiting that is not controlled by your nausea medication, call the clinic.   BELOW ARE SYMPTOMS THAT SHOULD BE REPORTED IMMEDIATELY:  *FEVER GREATER THAN 100.5 F  *CHILLS WITH OR WITHOUT FEVER  NAUSEA AND VOMITING THAT IS NOT CONTROLLED WITH YOUR NAUSEA MEDICATION  *UNUSUAL SHORTNESS OF BREATH  *UNUSUAL BRUISING OR BLEEDING  TENDERNESS IN MOUTH AND THROAT WITH OR WITHOUT PRESENCE OF ULCERS  *URINARY PROBLEMS  *BOWEL PROBLEMS  UNUSUAL RASH Items with * indicate a potential emergency and should be followed up as soon as possible.  Feel free to call the clinic you have any questions or concerns. The clinic phone number is [phone].    

## 2013-02-11 NOTE — Telephone Encounter (Signed)
appts made and printed. Pt is aware that tx will be adjusted for 7/28 and tx will be added for 8/4 and 8/11. i emailed MW to add the tx...td

## 2013-02-11 NOTE — Telephone Encounter (Signed)
Per staff message and POF I have scheduled appts.  JMW  

## 2013-02-12 ENCOUNTER — Encounter: Payer: Self-pay | Admitting: Oncology

## 2013-02-12 ENCOUNTER — Other Ambulatory Visit: Payer: Self-pay | Admitting: Physician Assistant

## 2013-02-12 ENCOUNTER — Telehealth: Payer: Self-pay | Admitting: *Deleted

## 2013-02-12 DIAGNOSIS — I10 Essential (primary) hypertension: Secondary | ICD-10-CM

## 2013-02-12 NOTE — Telephone Encounter (Signed)
sw pt gv appt for Dr. Kriste Basque office for 02/18/13@ 11:30am. Pt is aware ...td

## 2013-02-17 ENCOUNTER — Ambulatory Visit (HOSPITAL_BASED_OUTPATIENT_CLINIC_OR_DEPARTMENT_OTHER): Payer: Medicare Other

## 2013-02-17 ENCOUNTER — Other Ambulatory Visit: Payer: Self-pay | Admitting: *Deleted

## 2013-02-17 ENCOUNTER — Encounter: Payer: Self-pay | Admitting: Oncology

## 2013-02-17 ENCOUNTER — Other Ambulatory Visit (HOSPITAL_BASED_OUTPATIENT_CLINIC_OR_DEPARTMENT_OTHER): Payer: Medicare Other | Admitting: Lab

## 2013-02-17 ENCOUNTER — Ambulatory Visit (HOSPITAL_BASED_OUTPATIENT_CLINIC_OR_DEPARTMENT_OTHER): Payer: Medicare Other | Admitting: Oncology

## 2013-02-17 VITALS — BP 125/72 | HR 105 | Temp 98.4°F | Resp 20 | Ht 69.0 in | Wt 160.7 lb

## 2013-02-17 DIAGNOSIS — C773 Secondary and unspecified malignant neoplasm of axilla and upper limb lymph nodes: Secondary | ICD-10-CM

## 2013-02-17 DIAGNOSIS — C50119 Malignant neoplasm of central portion of unspecified female breast: Secondary | ICD-10-CM

## 2013-02-17 DIAGNOSIS — R42 Dizziness and giddiness: Secondary | ICD-10-CM

## 2013-02-17 DIAGNOSIS — C50111 Malignant neoplasm of central portion of right female breast: Secondary | ICD-10-CM

## 2013-02-17 DIAGNOSIS — R599 Enlarged lymph nodes, unspecified: Secondary | ICD-10-CM

## 2013-02-17 DIAGNOSIS — R0602 Shortness of breath: Secondary | ICD-10-CM | POA: Diagnosis not present

## 2013-02-17 DIAGNOSIS — C50911 Malignant neoplasm of unspecified site of right female breast: Secondary | ICD-10-CM

## 2013-02-17 DIAGNOSIS — Z5111 Encounter for antineoplastic chemotherapy: Secondary | ICD-10-CM | POA: Diagnosis not present

## 2013-02-17 DIAGNOSIS — C50919 Malignant neoplasm of unspecified site of unspecified female breast: Secondary | ICD-10-CM

## 2013-02-17 LAB — CBC WITH DIFFERENTIAL/PLATELET
Basophils Absolute: 0 10*3/uL (ref 0.0–0.1)
Eosinophils Absolute: 0 10*3/uL (ref 0.0–0.5)
HCT: 31.9 % — ABNORMAL LOW (ref 34.8–46.6)
HGB: 10.9 g/dL — ABNORMAL LOW (ref 11.6–15.9)
MONO#: 0.2 10*3/uL (ref 0.1–0.9)
NEUT%: 72.9 % (ref 38.4–76.8)
WBC: 2.6 10*3/uL — ABNORMAL LOW (ref 3.9–10.3)
lymph#: 0.5 10*3/uL — ABNORMAL LOW (ref 0.9–3.3)

## 2013-02-17 LAB — COMPREHENSIVE METABOLIC PANEL (CC13)
AST: 22 U/L (ref 5–34)
Albumin: 3.5 g/dL (ref 3.5–5.0)
BUN: 17.4 mg/dL (ref 7.0–26.0)
CO2: 25 mEq/L (ref 22–29)
Calcium: 9 mg/dL (ref 8.4–10.4)
Chloride: 100 mEq/L (ref 98–109)
Glucose: 82 mg/dl (ref 70–140)
Potassium: 3.8 mEq/L (ref 3.5–5.1)

## 2013-02-17 MED ORDER — SODIUM CHLORIDE 0.9 % IV SOLN
80.0000 mg/m2 | Freq: Once | INTRAVENOUS | Status: AC
  Administered 2013-02-17: 150 mg via INTRAVENOUS
  Filled 2013-02-17: qty 25

## 2013-02-17 MED ORDER — OXYCODONE-ACETAMINOPHEN 5-325 MG PO TABS: 1.0000 | ORAL_TABLET | Freq: Three times a day (TID) | ORAL | Status: DC | PRN

## 2013-02-17 MED ORDER — SODIUM CHLORIDE 0.9 % IV SOLN
Freq: Once | INTRAVENOUS | Status: AC
  Administered 2013-02-17: 14:00:00 via INTRAVENOUS

## 2013-02-17 MED ORDER — DIPHENHYDRAMINE HCL 50 MG/ML IJ SOLN
25.0000 mg | Freq: Once | INTRAMUSCULAR | Status: AC
  Administered 2013-02-17: 25 mg via INTRAVENOUS

## 2013-02-17 MED ORDER — FAMOTIDINE IN NACL 20-0.9 MG/50ML-% IV SOLN
20.0000 mg | Freq: Once | INTRAVENOUS | Status: AC
  Administered 2013-02-17: 20 mg via INTRAVENOUS

## 2013-02-17 MED ORDER — ONDANSETRON 16 MG/50ML IVPB (CHCC)
16.0000 mg | Freq: Once | INTRAVENOUS | Status: AC
  Administered 2013-02-17: 16 mg via INTRAVENOUS

## 2013-02-17 MED ORDER — DEXAMETHASONE SODIUM PHOSPHATE 20 MG/5ML IJ SOLN
20.0000 mg | Freq: Once | INTRAMUSCULAR | Status: AC
  Administered 2013-02-17: 20 mg via INTRAVENOUS

## 2013-02-17 MED ORDER — SODIUM CHLORIDE 0.9 % IJ SOLN
10.0000 mL | INTRAMUSCULAR | Status: DC | PRN
  Administered 2013-02-17: 10 mL
  Filled 2013-02-17: qty 10

## 2013-02-17 MED ORDER — LORAZEPAM 1 MG PO TABS
0.5000 mg | ORAL_TABLET | Freq: Once | ORAL | Status: AC
  Administered 2013-02-17: 0.5 mg via SUBLINGUAL

## 2013-02-17 MED ORDER — SODIUM CHLORIDE 0.9 % IV SOLN
226.0000 mg | Freq: Once | INTRAVENOUS | Status: AC
  Administered 2013-02-17: 230 mg via INTRAVENOUS
  Filled 2013-02-17: qty 23

## 2013-02-17 MED ORDER — HEPARIN SOD (PORK) LOCK FLUSH 100 UNIT/ML IV SOLN
500.0000 [IU] | Freq: Once | INTRAVENOUS | Status: AC | PRN
  Administered 2013-02-17: 500 [IU]
  Filled 2013-02-17: qty 5

## 2013-02-17 MED ORDER — LORAZEPAM 1 MG PO TABS
0.5000 mg | ORAL_TABLET | Freq: Once | ORAL | Status: AC
  Administered 2013-02-17: 0.5 mg via ORAL

## 2013-02-17 MED ORDER — LISINOPRIL 10 MG PO TABS: 10.0000 mg | ORAL_TABLET | Freq: Every day | ORAL | Status: DC

## 2013-02-17 NOTE — Patient Instructions (Addendum)
[facility] Discharge Instructions for Patients Receiving Chemotherapy  Today you received the following chemotherapy agents :  Taxol, Carboplatin.  To help prevent nausea and vomiting after your treatment, we encourage you to take your nausea medication as instructed by your physician.   If you develop nausea and vomiting that is not controlled by your nausea medication, call the clinic.   BELOW ARE SYMPTOMS THAT SHOULD BE REPORTED IMMEDIATELY:  *FEVER GREATER THAN 100.5 F  *CHILLS WITH OR WITHOUT FEVER  NAUSEA AND VOMITING THAT IS NOT CONTROLLED WITH YOUR NAUSEA MEDICATION  *UNUSUAL SHORTNESS OF BREATH  *UNUSUAL BRUISING OR BLEEDING  TENDERNESS IN MOUTH AND THROAT WITH OR WITHOUT PRESENCE OF ULCERS  *URINARY PROBLEMS  *BOWEL PROBLEMS  UNUSUAL RASH Items with * indicate a potential emergency and should be followed up as soon as possible.  Feel free to call the clinic you have any questions or concerns. The clinic phone number is [phone].    

## 2013-02-17 NOTE — Progress Notes (Signed)
ID: Debra Mcintyre   DOB: 05/05/1941  MR#: [id number]  WUJ#:[id number]  PCP: [name], MD GYN: [name], [name], [name],   HISTORY OF PRESENT ILLNESS: Debra Mcintyre had screening mammography at [facility] 04/10/2012 raising the question of some axillary lymph nodes on the right. Additional views of the right axilla and right axillary ultrasound performed 04/04/2012 showed 3 lymph nodes that appeared larger than prior. The largest measured 1.5 cm. 2 of them had cortical thickening. Close followup was suggested, and repeat right axillary ultrasound 06/25/2012 showed no significant change in the lymph nodes in question. Followup ultrasound in 3 months was recommended, but in the interim the patient saw her primary physician, Dr. [name] and ADL, and he set her up for a chest CT on 07/08/2012, which showed a mild asymmetric density in the upper mid right breast. There was a prominent right axillary lymph node measuring 1.9 cm, with a few smaller adjacent nodes asymmetric with compared to the left side. Again, short interval followup was recommended, and on 10/07/2012 the patient had digital right mammography and ultrasonography, now at the breast Center. Dr. [name] noted pleomorphic calcifications spanning an area of 6.3 cm without associated mass. Physical exam was unremarkable. Ultrasound showed an area of adenopathy in the right axilla measuring 1.6 cm, with a second lymph node with a thickened cortex measuring 1.2 cm. No suspicious mass was seen by ultrasound in the right breast.  Biopsy of the larger axillary lymph node on 10/07/2012 showed ([id number]) an invasive ductal carcinoma, triple negative, with an MIB-1 of 66%. Biopsy of the right breast the next day, [id number]) showed invasive ductal carcinoma, grade 3. Breast MRI 10/14/2012 showed a total area of irregular enhancement in the right breast measuring up to 12 cm. MRI guided biopsy of an  area in the upper inner quadrant of the right breast on 10/23/2012 showed ([id number]) ductal carcinoma in situ, with foci worrisome for invasion.  The patient's subsequent history is as detailed below  INTERVAL HISTORY: Debra Mcintyre returns today for followup of her locally advanced right breast cancer.  She is due for her 6th of 12 planned weekly doses of carboplatin/paclitaxel being given in the neoadjuvant setting.    REVIEW OF SYSTEMS: Debra Mcintyre has not developed any peripheral neuropathy symptoms, but she continues to have some shortness of breath and dizziness, which is very intermittent. It happens maybe every 2 or 3 days. We suspect this is related to variations in her blood pressure, and she tells me she is checking her blood pressure at home, but she didn't bring me her readings. She did miss some from memory. Her vision is a little bit blurred, she has a bit of a runny nose, her appetite is poor and she does have altered taste. After treatments as she does okay for 2 or 3 days, probably the days when she is getting the steroids, and after that she pretty much doesn't do her much on told her treatments resume. There have been no unusual headaches, nausea, vomiting, cough, phlegm production, or change in bowel or bladder habits. A detailed review of systems was otherwise noncontributory  PAST MEDICAL HISTORY: Past Medical History  Diagnosis Date  . Hypertension   . Cerebrovascular disease, unspecified   . Cerebral aneurysm, nonruptured   . Pure hypercholesterolemia   . Hypothyroidism   . Colonic polyp   . Breast cancer 09/2012    right breast/right axillary lymph node t2,pn1, stage 11b,  invasive ductal carcinma, grade 3, triple negative, with an mib-1 of 15%  . History of chemotherapy     PAST SURGICAL HISTORY: Past Surgical History  Procedure Laterality Date  . Left middle cerebral artery angioplasty  2004    by [name] had 2 follow up occurances  . Portacath placement N/A  11/05/2012    Procedure: INSERTION PORT-A-CATH;  Surgeon: [name], MD;  Location: [facility];  Service: General;  Laterality: N/A;  . Breast biopsy Right 10/23/2012  . Breast biopsy Right 10/08/2012    FAMILY HISTORY Family History  Problem Relation Age of Onset  . Lung cancer Mother 79  . Heart disease Father   . Colon cancer Brother 102  . Alcohol abuse Brother   . Colon cancer Maternal Uncle     dx in his 64s  . Cancer Maternal Aunt     unknown type  . Ovarian cancer Other 64   the patient's father died at the age of 12. The patient's mother died at the age of 13 from lung cancer. She was not a smoker. The cancer was diagnosed when she was 70 years old. The patient's mother had 2 sisters and one brother. One of those sisters had lung cancer and a brother had colon cancer, both diagnosed in their 54s. The patient herself has one brother who died from colon cancer at the age of 59. That brother has a daughter who was diagnosed with uterine cancer at the age of 67. All this raises the question of possible Lynch syndrome and I have referred the patient for genetics counseling.  GYNECOLOGIC HISTORY: Menarche age 36, menopause around 41. The patient is GX P0. She took birth control pills for many years with no complications.  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 [name] who lives in "[place]" and is financial vice president of [employer], and [place] who lives in [place] and works as a Transport planner. The patient has 5 grandchildren. She is currently not a church attender   ADVANCED DIRECTIVES: Not in place; this was discussed 10/29/2012 and the patient was advised to complete a healthcare power of attorney document  HEALTH MAINTENANCE: History  Substance Use Topics  . Smoking status: Former Smoker -- 1.00 packs/day for 20 years     Types: Cigarettes    Quit date: 04/02/1986  . Smokeless tobacco: Never Used  . Alcohol Use: No     Colonoscopy: 2012  PAP: 2012  Bone density:  Lipid panel:  No Known Allergies  Current Outpatient Prescriptions  Medication Sig Dispense Refill  . acyclovir (ZOVIRAX) 400 MG tablet Take 1 tablet (400 mg total) by mouth 2 (two) times daily.  60 tablet  2  . Alum & Mag Hydroxide-Simeth (MAGIC MOUTHWASH W/LIDOCAINE) SOLN Take 5 mLs by mouth 3 (three) times daily as needed.  100 mL  1  . aspirin 325 MG tablet Take 325 mg by mouth daily.        . Calcium Carbonate-Vitamin D (CALCIUM 600+D) 600-200 MG-UNIT TABS Take 1 tablet by mouth 2 (two) times daily.        . cephALEXin (KEFLEX) 500 MG capsule Take 1 capsule (500 mg total) by mouth 3 (three) times daily.  21 capsule  0  . ciprofloxacin (CIPRO) 500 MG tablet Take 1 tablet (500 mg total) by mouth 2 (two) times daily.  14 tablet  3  . clopidogrel (PLAVIX) 75 MG tablet TAKE ONE TABLET BY MOUTH EVERY DAY  30 tablet  6  . dexamethasone (DECADRON) 4 MG tablet       . famotidine (PEPCID) 40 MG tablet Take 1 tablet (40 mg total) by mouth daily.  30 tablet  3  . fluconazole (DIFLUCAN) 100 MG tablet Take 1 tablet (100 mg total) by mouth daily.  7 tablet  3  . levothyroxine (SYNTHROID, LEVOTHROID) 125 MCG tablet Take 1 tablet (125 mcg total) by mouth daily.  30 tablet  11  . lidocaine-prilocaine (EMLA) cream Apply topically as needed. Apply over port 1-2 hors before chemo and cover with plastic wrap  30 g  0  . lisinopril (PRINIVIL) 10 MG tablet Take 1 tablet (10 mg total) by mouth daily.  30 tablet  3  . loratadine (CLARITIN) 10 MG tablet Take 10 mg by mouth daily.      Marland Kitchen LORazepam (ATIVAN) 0.5 MG tablet Take 1 tablet (0.5 mg total) by mouth at bedtime as needed for anxiety (Nausea or vomiting).  30 tablet  0  . LORazepam (ATIVAN) 0.5 MG tablet Take 1 tablet (0.5 mg total) by mouth once.  30 tablet  0  . metoprolol succinate (TOPROL-XL) 100 MG 24 hr  tablet TAKE ONE TABLET BY MOUTH EVERY DAY  30 tablet  11  . metroNIDAZOLE (METROCREAM) 0.75 % cream       . Multiple Vitamin (MULTIVITAMIN) capsule Take 1 capsule by mouth daily.        . Naproxen Sodium (ALEVE) 220 MG CAPS Take 1 capsule by mouth as needed.      . ondansetron (ZOFRAN) 8 MG tablet 1 tab by mouth twice daily x 3 days after chemo, then every 12 hours if needed for nausea  30 tablet  1  . oxyCODONE-acetaminophen (ROXICET) 5-325 MG per tablet Take 1 tablet by mouth every 8 (eight) hours as needed for pain.  24 tablet  0  . prochlorperazine (COMPAZINE) 10 MG tablet       . rosuvastatin (CRESTOR) 20 MG tablet Take 1 tablet (20 mg total) by mouth daily.  30 tablet  11  . silver sulfADIAZINE (SILVADENE) 1 % cream       . venlafaxine XR (EFFEXOR-XR) 75 MG 24 hr capsule Take 1 capsule (75 mg total) by mouth daily.  30 capsule  6   No current facility-administered medications for this visit.    OBJECTIVE: Middle-aged white woman in no acute distress Filed Vitals:   02/17/13 1209  BP: 125/72  Pulse: 105  Temp: 98.4 F (36.9 C)  Resp: 20     Body mass index is 23.72 kg/(m^2).    ECOG FS: 1 Filed Weights   02/17/13 1209  Weight: 160 lb 11.2 oz (72.893 kg)   Sclerae unicteric Oropharynx clear No cervical or supraclavicular adenopathy; Lungs clear to auscultation bilaterally Heart regular rate and rhythm, no murmur appreciated Abdomen soft, nontender, positive bowel sounds MSK no focal spinal tenderness, No evidence of palmar or pantar erythrodysesthesia No peripheral edema Neuro: nonfocal, well oriented, pleasant affect Breasts:  No palpable abnormalities in the right breast. Left breast is unremarkable. Axillae are benign bilaterally Port is intact in the left upper chest wall   LAB RESULTS: Lab Results  Component Value Date   WBC 2.6* 02/17/2013   NEUTROABS 1.9 02/17/2013   HGB 10.9* 02/17/2013   HCT 31.9* 02/17/2013   MCV 87.6 02/17/2013   PLT 139* 02/17/2013  Chemistry      Component Value Date/Time   NA 134* 02/11/2013 1044   NA 138 06/04/2012 1124   K 3.7 02/11/2013 1044   K 4.3 06/04/2012 1124   CL 102 01/28/2013 0909   CL 103 06/04/2012 1124   CO2 24 02/11/2013 1044   CO2 29 06/04/2012 1124   BUN 15.9 02/11/2013 1044   BUN 14 06/04/2012 1124   CREATININE 0.7 02/11/2013 1044   CREATININE 0.7 06/04/2012 1124      Component Value Date/Time   CALCIUM 9.5 02/11/2013 1044   CALCIUM 9.9 06/04/2012 1124   ALKPHOS 52 02/11/2013 1044   ALKPHOS 47 06/04/2012 1124   AST 30 02/11/2013 1044   AST 27 06/04/2012 1124   ALT 27 02/11/2013 1044   ALT 32 06/04/2012 1124   BILITOT 0.82 02/11/2013 1044   BILITOT 1.1 06/04/2012 1124       STUDIES: Ir Cv Line Injection  01/28/2013   *RADIOLOGY REPORT*  Clinical Data: Breast carcinoma.  Port placement 11/05/2012. Inability to flush aspirate.  port catheter injection under fluoroscopy  Comparison: 11/05/2012  Technique and findings:  Initial fluoroscopic inspection demonstrated stable position of the left subclavian port catheter, tip in the distal SVC.  The access needle appears well positioned. The port could be aspirated and flushed without difficulty.  Under fluoroscopy, water-soluble contrast was injected through the catheter. No leak.  No evidence of encasing fibrin sheath or thrombus.  Venous outflow is widely patent into the right atrium.  IMPRESSION:  Left subclavian port catheter remains well positioned and widely patent without apparent complication. I discussed the  test results over the telephone with Dr. Darnelle Catalan at the time of interpretation.   Original Report Authenticated By: D. Andria Rhein, MD   ASSESSMENT: 72 y.o. [place] woman   (1)  status post right breast and right axillary lymph node biopsies February 2014 of a clinical T2, pN1, stage IIB,  invasive ductal carcinoma, grade 3, triple negative, with an MIB-1 of 15%  (2)  a third biopsy from a different quadrant 10/23/2012 showed ductal carcinoma in  situ  (3)  currently being treated in the neoadjuvant setting, completed 4 dose dense cycles of doxorubicin/ cyclophosphamide 12/23/2012, followed by 12 weekly doses of paclitaxel and carboplatin started 01/07/2013  (4) family history suggestive of Lynch syndrome; genetics evaluation pending  PLAN:  Debra Mcintyre will get to halfway up for weekly carboplatin and paclitaxel treatments today, but I think if we continue to treat her weekly we are not going to be able to get to 12. I think she needs a week off and we discussed the timing of that. She has a trip that she very much wants to take beginning August 18 and returning September 2.  What we are going to do is give her next week off. She will have her seventh treatment on July 21. We will try to get 4 more treatments which takes as to August 11 and then after she comes back we will do the last 2 treatments, assuming no neuropathy has developed, September 4 and September 11.  I have asked her to stop her 20 mg lisinopril/oh 0.5 mg hydrochlorothiazide and instead take 10 mg of lisinopril alone. She will continue to monitor her blood pressure. I am hoping with the simple maneuvers we may of get rid of the occasional problems with dizziness or presyncope. Otherwise she knows to call for any problems that may develop before her next visit here.   Lowella Dell MD  02/17/2013  

## 2013-02-18 ENCOUNTER — Other Ambulatory Visit: Payer: Self-pay | Admitting: Oncology

## 2013-02-18 ENCOUNTER — Ambulatory Visit (INDEPENDENT_AMBULATORY_CARE_PROVIDER_SITE_OTHER): Payer: Medicare Other | Admitting: Pulmonary Disease

## 2013-02-18 ENCOUNTER — Encounter: Payer: Self-pay | Admitting: Pulmonary Disease

## 2013-02-18 ENCOUNTER — Other Ambulatory Visit: Payer: Self-pay | Admitting: *Deleted

## 2013-02-18 VITALS — BP 128/72 | HR 88 | Temp 98.5°F | Ht 69.0 in | Wt 162.4 lb

## 2013-02-18 DIAGNOSIS — E7800 Pure hypercholesterolemia, unspecified: Secondary | ICD-10-CM

## 2013-02-18 DIAGNOSIS — C50919 Malignant neoplasm of unspecified site of unspecified female breast: Secondary | ICD-10-CM | POA: Diagnosis not present

## 2013-02-18 DIAGNOSIS — C773 Secondary and unspecified malignant neoplasm of axilla and upper limb lymph nodes: Secondary | ICD-10-CM | POA: Diagnosis not present

## 2013-02-18 DIAGNOSIS — R599 Enlarged lymph nodes, unspecified: Secondary | ICD-10-CM

## 2013-02-18 DIAGNOSIS — I10 Essential (primary) hypertension: Secondary | ICD-10-CM

## 2013-02-18 DIAGNOSIS — F411 Generalized anxiety disorder: Secondary | ICD-10-CM

## 2013-02-18 DIAGNOSIS — E039 Hypothyroidism, unspecified: Secondary | ICD-10-CM

## 2013-02-18 DIAGNOSIS — I679 Cerebrovascular disease, unspecified: Secondary | ICD-10-CM | POA: Diagnosis not present

## 2013-02-18 DIAGNOSIS — C50119 Malignant neoplasm of central portion of unspecified female breast: Secondary | ICD-10-CM

## 2013-02-18 DIAGNOSIS — C50911 Malignant neoplasm of unspecified site of right female breast: Secondary | ICD-10-CM

## 2013-02-18 DIAGNOSIS — M1990 Unspecified osteoarthritis, unspecified site: Secondary | ICD-10-CM

## 2013-02-18 DIAGNOSIS — C50111 Malignant neoplasm of central portion of right female breast: Secondary | ICD-10-CM

## 2013-02-18 MED ORDER — LORAZEPAM 0.5 MG PO TABS: 0.5000 mg | ORAL_TABLET | Freq: Every evening | ORAL | Status: DC | PRN

## 2013-02-18 NOTE — Progress Notes (Signed)
Subjective:    Patient ID: Debra Mcintyre, female    DOB: September 28, 1940, 72 y.o.   MRN: [id number]  HPI 72 y/o WF here for a follow up visit... she has multiple medical problems as noted below...   ~  April 21, 2011:  Add-on appt for refractory asthmatic bronchitis>    She notes >66mo hx cough, congestion, thick beige sputum, and assoc SOB/ wheezing/ etc;  Seen by TP w/ bronchitis 8/20 & treated w/ Augmentin, Pred taper, Mucinex, Hydromet;  Improved but not resolved & recently experienced incr dyspnea, wheezing, chest congestion;  Denies f/c/s, no hemoptysis, min CP just from the coughing, not resting well...    Other medical issues appear stable> BP controlled on Metoprolol & Lisinopril/HCT;  Cerebrovasc dis stable on ASA/ Plavix;  Lipids controlled on diet + Lip80;  Thyroid remains regulated & clinically euthyroid on 169mcg/d;  Anxiety controlled on Effexor...    CXR 04/03/11 reviewed> Clear, NAD;  PFT today showed FVC=2.75 (82%), FEV1=2.15 (84%), %1sec=78, Mid-flows=90%pred...    We decided to treat w/ DepoMedrol, PREDNISONE 4d-tapering sched, ADVAIR 250 Bid sampler; MUCINEX 2Bid, Fluids, & TUSSIONEX; rov 2-3 wks.  ~  June 12, 2011:  6wk ROV & she reports that her prev AB is much improved/ resolved; she has min residual AM cough/ phlegm & denies chest tightness, wheezing, congestion, etc;  She has finished the Pred, stopped the Advair & Mucinex, and uses the Tussionex as needed; I indicated to her that we might need to restart the Advair & Mucinex if symptoms recur...    NEW PROB = Chest Pain >> she describes what she called "heartburn" by which she means a CP= dull pressure sensation & tightness substernally that grabs her & occas radiates to the jaw; denies N V diaphoresis SOB or radiation to the arms etc; the pain occurs about once every 2wks on average usually while hurrying about but occas at rest; she says it will dissipate after GasX in about , but goes away after if she takes an  aspirin;  She has no known hx heart disease, but risk factors include +FamHx (Father w/ hrt dis but died age 45), HBP, Chol, remote smoker...  EKG shows rsr' c/w IVCD, leftward axis, NSR, no acute STTWA...  We discussed referral to Cards for screening eval==> she requests [facility].  ~  May 28, 2012:  Yearly ROV & Debra Mcintyre has been stable, no new complaints or concerns except for some paresthesia in fingers & toes that she believes is due to Lip80, and she wants to change meds... She has 5 grandchildren & the oldest grand daughter received a [employer] schlorship...    HBP> on MetopER100, LisinHCT20-12.5; BP= 104/68 & she denies CP, palpit, SOB, edema; we never received any data from [facility]...    Cerebrovasc Dis> on [id number] & Plavix75; s/p aneurysm & TIA; stable w/o cerebral ischemic symptoms- she has not had f/u DrDeveshwar,IR- she notes she was told no need for f/u angiography (we might consider MRI later)...    Chol> on Lip80 but she says only taking intermittently due to tingling in fingers & toes she feels is due to this- FLP showed TChol 189, TG 231, HDL 45, LDL 112; we reviewed low fat wt reducing diet, & decided to change to CRESTOR20/d...    Hypothyroid> on Synthroid 140mcg/d; TSH= 1.20& she is clinically euthyroid...    Colon polyps> last colon was 11/10 w/ one adenomatous polyp removed, f/u planned 70yrs.    DJD, FM, Osteop> on  Calcium, MVI, OTC analgesics; BMD followed by her GYN...    Anxiety> on EffexorXR75 and she reports stable on this med...  We reviewed prob list, meds, xrays and labs> see below for updates >> OK Flu vaccine today... LABS 10/13:  FLP- not at goals on intermit Lip80;  Chems- wnl;  CBC- wnl;  TSH=1.20  ~  July 01, 2012:  Add-on appt for "lymph node"> pt had mammogram 8/13 at [facility] which is reported neg- no lesions identified but Ultrasound showed a right axillary lymph node; a repeat sonar done last week confirmed the right axillary adenopathy & showed no  change; she has been quite anxious about this & set up this appt to find out what is causing the LN enlargement;  She feels well, appetite good, wt stable; she denies any f/c/s/ etc; ?sm amt local hidradenitis? no other obvious adenopathy or swelling- she does have a soft tissue knot in post aspect of right shoulder that she says is unchanged from her childhood & she actually saw an orthopedist in [place] about this some yrs ago ? etiology (they left it alone, no bx etc)...     Exam reveals a sm nodule deep in right axilla, no nodules on left, no other superficial adenopathy palpated...    We discussed further eval w/ CXR (last film 8/12 was neg- NAD), and CT Chest to check the lungs, mediastinum, & the axilla; NOTE: labs 10/13 were OK... CXR 11/13 showed norm heart size, min incr interstitial markings (no change), scoliosis... CT Chest 11/13 showed: 1- "min prominent" right axillary LN, <2cm size, Rec f/u sonar;  2- sm nodules, largest 4-69mm in post RLL (report says LLL). Rec f/u CT 51mo;  3- <2cm cystic pancreatic lesion (they rec MRI in 93yr);  4- scoliosis w/ mild to mod spondylosis... We discussed right axillary sonar in 3 months and CT Chest w/ contrast in 6 months...  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     Recently her BP has been "up & down" per pt> on  MetopER100 & LisinHCT20-12.5; she has log of recent BP readings and most are wnl; DrMagrinat has adjusted her med by changing the LisinHCT to Lisin10 alone & I endorse this change in meds, she will continue to monitor...  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   HYPERTENSION (ICD-401.9) -  ~  on TOPROL XL 100mg /d,  LISINOPRIL/ Hct 20/12.5 daily; BP well controlled on these meds; denies HA, fatigue, visual changes, CP, palipit, dizziness, syncope, dyspnea, edema, etc; she does water aerobics & walks for exercise...  ~  CXR 8/12 showed normal heart size, clear lungs, scoliosis, NAD... ~  10/12:  Presents  w/ chest discomfort> EKG showed NSR, rate 69, rsr' in V1-2, otherw wnl; referred to Cards=> she requests DrKelly. ~  11/13:  BP= 128/82 & she remains asymptomatic... ~  7/14:  On MetopER100 & LisinHCT20-12.5; BP= 128/72 but has been volatile at home & in Oncology office prompting drMagrinat to change to Lisin10 & stop the Hct in light of her 103 wt loss...   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 she's been stable since that time w/ out pt f/u by IR, [name]> she indicates he said no further studies needed. ~  CDopplers 5/04 showed mild plaque, no signif ICA stenoses... ~  MR studies 10/09 showed sm vessel dis; poss restenosis left MCA w/ angiogram rec- but wasn't done; mod stenosis at origin of left vertebral art w/ tortuosity... 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 ~  10/12:  she remains asymptomatic- w/o cerebral ischemic symptoms... ~  10/13:   she continues stable on ASA/ Plavix w/o cerebral ischemic symptoms...  HYPERCHOLESTEROLEMIA (ICD-272.0) >>  ~  FLP 3/08 shows Tchol 180, TG 206, HDL 49, LDL 105... rec same meds, better diet, get wt down. ~  FLP 10/09 on Lip80 showed TChol 150, TG 178, HDL 41, LDL 74 ~  FLP 1/11 on Lip80+Zetia10 showed TChol 162, TG 226, HDL 47, LDL 95... she wants to stop Zetia, get on diet & get wt down. ~  FLP 1/12 on Lip80 showed TChol 173, TG 164, HDL 45, LDL 95 ~  FLP 10/13 on Lip80 intermittently showed TChol 189, TG 231, HDL 45, LDL 112... She was c/o paresthesias that she was convinced was due to the Lip80 & she requested change to CRESTOR 20mg /d... ~  FLP 4/14 on Cres20 showed TChol 167, TG 141, HDL 46, LDL 93  HYPOTHYROIDISM (ICD-244.9) - on SYNTHROID 141mcg/d... ~  labs 3/08 showed TSH = 0.23 ~  labs 10/09 showed TSH= 0.64 ~  labs 1/11 showed TSH= 0.18... she wants to keep same dose to aide wt reduction. ~  labs 1/12 showed TSH= 0.20... 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 f/u planned 46yrs. ~  f/u colonoscopy 11/10 showed 2 polyps- one adenomatous w/ f/u planned 31yrs.  DEGENERATIVE JOINT DISEASE (ICD-715.90) - uses Tylenol & OTC meds as needed... in 2004 seen by [facility] w/ soft tissue hemangioma found in right shoulder area... second opinion from DrWWard @  [facility] confirmed this- no surg necessary... ~  1/11:  notes some pain in hands & wrist... ~  1/12:  Stable w/o acute complaints or problem areas...  FIBROMYALGIA (ICD-729.1)  OSTEOPOROSIS (ICD-733.00) - she indicates that this is followed & managed by GYN, DrCousins> ?when last BMD was done "she keeps up w/ this" > on Calcium, & Vitamins... ~  labs 1/11 showed Vit D level = 54...  TIA (ICD-435.9) - as above, she remains on ASA325 & PLAVIX75... last saw DrReynolds in 2006... ~  adm 5/04 with 2 TIA's and MRA showing  tight stenosis of the left middle cerebral artery... arteriogram by Dr. Mamie Nick confirmed a web-like plaque in the left  M1 segment of the left MCA with signif stenosis...  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 subseq PTA of left MCA w/ good result- resid 20% stenosis seen on f/u angiograms along w/ a 1mm saccular aneurysm seen in the right MCA trifurcation...  ANXIETY (ICD-300.00) - on EFFEXOR 75mg /d for hot flashes, she says.... she wishes to continue the med "it keeps me on an even keel".  Health Maintenance - GYN= DrCousins & she will call for f/u... Mammograms at [facility]... BMDs at [facility] & results sent to DrCousins... ~  Immunizations: she refuses Flu vaccine... given PNEUMOVAX- 1/11, and Tdap- 1/11...   Past Surgical History  Procedure Laterality Date  . Left middle cerebral artery angioplasty  2004    by [name] had 2 follow up occurances  . Portacath placement N/A 11/05/2012    Procedure: INSERTION PORT-A-CATH;  Surgeon: [name], MD;  Location: [facility];  Service: General;  Laterality: N/A;  . Breast biopsy Right 10/23/2012  . Breast biopsy Right 10/08/2012    MED LIST REFLECTS ADDITIONS AND PRNs PER ONCOLOGY>>  Outpatient Encounter Prescriptions as of 02/18/2013  Medication Sig Dispense Refill  . acyclovir (ZOVIRAX) 400 MG tablet Take 1 tablet (400 mg total) by mouth 2 (two) times daily.  60 tablet  2  . Alum & Mag Hydroxide-Simeth (MAGIC MOUTHWASH W/LIDOCAINE) SOLN Take 5 mLs by mouth 3 (three) times daily as needed.  100 mL  1  . aspirin 325 MG tablet Take 325 mg by mouth daily.        . Calcium Carbonate-Vitamin D (CALCIUM 600+D) 600-200 MG-UNIT TABS Take 1 tablet by mouth 2 (two) times daily.        . clopidogrel (PLAVIX) 75 MG tablet TAKE ONE TABLET BY MOUTH EVERY DAY  30 tablet  6  . famotidine (PEPCID) 40 MG tablet Take 1 tablet (40 mg total) by mouth daily.  30 tablet  3  .  levothyroxine (SYNTHROID, LEVOTHROID) 125 MCG tablet Take 1 tablet (125 mcg total) by mouth daily.  30 tablet  11  . lidocaine-prilocaine (EMLA) cream Apply topically as needed. Apply over port 1-2 hors before chemo and cover with plastic wrap  30 g  0  . lisinopril (PRINIVIL) 10 MG tablet Take 1 tablet (10 mg total) by mouth daily.  30 tablet  3  . loratadine (CLARITIN) 10 MG tablet Take 10 mg by mouth daily.      . metoprolol succinate (TOPROL-XL) 100 MG 24 hr tablet TAKE ONE TABLET BY MOUTH EVERY DAY  30 tablet  11  . metroNIDAZOLE (METROCREAM) 0.75 % cream As directed      . Multiple Vitamin (MULTIVITAMIN) capsule Take 1 capsule by mouth daily.        . Naproxen Sodium (ALEVE) 220 MG CAPS Take 1 capsule by mouth as needed.      . ondansetron (ZOFRAN) 8 MG tablet 1 tab by mouth twice daily x 3 days after chemo, then every 12 hours if needed for nausea  30 tablet  1  . oxyCODONE-acetaminophen (ROXICET) 5-325 MG per tablet Take 1 tablet by mouth every 8 (eight) hours as needed for pain.  24 tablet  0  . prochlorperazine (COMPAZINE) 10 MG tablet Take 10 mg by mouth every 6 (six) hours as needed.       . rosuvastatin (CRESTOR) 20 MG tablet Take 1 tablet (20 mg total) by mouth daily.  30 tablet  11  . silver sulfADIAZINE (  SILVADENE) 1 % cream As directed      . venlafaxine XR (EFFEXOR-XR) 75 MG 24 hr capsule Take 1 capsule (75 mg total) by mouth daily.  30 capsule  6  . [DISCONTINUED] dexamethasone (DECADRON) 4 MG tablet As needed      . [DISCONTINUED] LORazepam (ATIVAN) 0.5 MG tablet Take 1 tablet (0.5 mg total) by mouth at bedtime as needed for anxiety (Nausea or vomiting).  30 tablet  0  . cephALEXin (KEFLEX) 500 MG capsule Take 1 capsule (500 mg total) by mouth 3 (three) times daily.  21 capsule  0  . ciprofloxacin (CIPRO) 500 MG tablet Take 1 tablet (500 mg total) by mouth 2 (two) times daily.  14 tablet  3  . fluconazole (DIFLUCAN) 100 MG tablet Take 1 tablet (100 mg total) by mouth daily.  7  tablet  3  . [DISCONTINUED] LORazepam (ATIVAN) 0.5 MG tablet Take 1 tablet (0.5 mg total) by mouth once.  30 tablet  0   No facility-administered encounter medications on file as of 02/18/2013.    No Known Allergies   Current Medications, Allergies, Past Medical History, Past Surgical History, Family History, and Social History were reviewed in [employer] record.    Review of Systems    Constitutional:  Denies F/C/S, appetite is fair, weight is down 10# HEENT:  No HA, visual changes, earache, nasal symptoms, sore throat, hoarseness. Resp:  No cough, sputum, hemoptysis; +SOB w/o tightness or wheezing. Cardio:  No CP, palpit, orthopnea, edema; notes some DOE GI:  Denies N/V/D/C or blood in stool; no reflux, abd pain, distention, or gas. GU:  No dysuria, freq, urgency, hematuria, or flank pain. MS:  Denies joint pain, swelling, tenderness; no neck pain, back pain, etc. Neuro:  No tremors, seizures, dizziness, syncope; +weakness, +numbness, +sl gait abn. Skin:  No suspicious lesions or skin rash. Heme:  No bruising, bleeding, etc; +right ax node is diminished Psyche: Denies confusion, sleep disturbance, hallucinations, anxiety, depression.   Objective:   Physical Exam      WD, WN, 72 y/o WF in NAD... GENERAL:  Alert & oriented; pleasant & cooperative... HEENT:  [place]/AT, EOM-wnl, PERRLA, EACs-clear, TMs-wnl, NOSE-clear, THROAT-clear & wnl. NECK:  Supple w/ fairROM; no JVD; normal carotid impulses w/o bruits; no thyromegaly or nodules palpated; no lymphadenopathy. CHEST:  Clear to P&A w/o wheezing, rales, or rhonchi detected... HEART:  Regular Rhythm; without murmurs/ rubs/ or gallops. ABDOMEN:  Soft & nontender; normal bowel sounds; no organomegaly or masses palpated. EXT: without deformities, mild arthritic changes; no varicose veins/ +venous insuffic/ no edema. NEURO:  CN's intact;  no focal neuro deficits... DERM:  No lesions noted; no rash etc...  RADIOLOGY  DATA:  Reviewed in the EPIC EMR & discussed w/ the patient...  LABORATORY DATA:  Reviewed in the EPIC EMR & discussed w/ the patient...   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   ASTHMATIC BRONCHITIS>  No recent URIs or resp exac & she is once again not on regular meds, denies breathing problems, etc...  HBP>  Controlled on BBlocker, & ACE (DrM stopped the diuretic recently), continue same...  Cerebrovasc Dis>  She had TIA 2004 w/ eval revealing left MCA stenosis; DrDeveshwar did PTA w/ good result; incidental 1-50mm aneurysm noted in the right MCA; she has remained asymptomatic since 2004 on the ASA/ Plavix.  CHOL>  Unable to tol  Lip80 due to paresthesias & wants to change to Crestor- we will start w/ 20mg  & f/u FLP on this...  HPYOTHYROID>  On Synthroid 158mcg/d w/ TSH wnl now, continue same...  Other medical problems as noted...   Patient's Medications  New Prescriptions   No medications on file  Previous Medications   ACYCLOVIR (ZOVIRAX) 400 MG TABLET    Take 1 tablet (400 mg total) by mouth 2 (two) times daily.   ALUM & MAG HYDROXIDE-SIMETH (MAGIC MOUTHWASH W/LIDOCAINE) SOLN    Take 5 mLs by mouth 3 (three) times daily as needed.   ASPIRIN 325 MG TABLET    Take 325 mg by mouth daily.     CALCIUM CARBONATE-VITAMIN D (CALCIUM 600+D) 600-200 MG-UNIT TABS    Take 1 tablet by mouth 2 (two) times daily.     CEPHALEXIN (KEFLEX) 500 MG CAPSULE    Take 1 capsule (500 mg total) by mouth 3 (three) times daily.   CIPROFLOXACIN (CIPRO) 500 MG TABLET    Take 1 tablet (500 mg total) by mouth 2 (two) times daily.   CLOPIDOGREL (PLAVIX) 75 MG TABLET    TAKE ONE TABLET BY MOUTH EVERY DAY   FAMOTIDINE (PEPCID) 40 MG TABLET    Take 1 tablet (40 mg total) by mouth daily.   FLUCONAZOLE (DIFLUCAN) 100 MG TABLET    Take 1 tablet (100 mg total) by mouth daily.   LEVOTHYROXINE  (SYNTHROID, LEVOTHROID) 125 MCG TABLET    Take 1 tablet (125 mcg total) by mouth daily.   LIDOCAINE-PRILOCAINE (EMLA) CREAM    Apply topically as needed. Apply over port 1-2 hors before chemo and cover with plastic wrap   LISINOPRIL (PRINIVIL) 10 MG TABLET    Take 1 tablet (10 mg total) by mouth daily.   LORATADINE (CLARITIN) 10 MG TABLET    Take 10 mg by mouth daily.   METOPROLOL SUCCINATE (TOPROL-XL) 100 MG 24 HR TABLET    TAKE ONE TABLET BY MOUTH EVERY DAY   METRONIDAZOLE (METROCREAM) 0.75 % CREAM    As directed   MULTIPLE VITAMIN (MULTIVITAMIN) CAPSULE    Take 1 capsule by mouth daily.     NAPROXEN SODIUM (ALEVE) 220 MG CAPS    Take 1 capsule by mouth as needed.   ONDANSETRON (ZOFRAN) 8 MG TABLET    1 tab by mouth twice daily x 3 days after chemo, then every 12 hours if needed for nausea   OXYCODONE-ACETAMINOPHEN (ROXICET) 5-325 MG PER TABLET    Take 1 tablet by mouth every 8 (eight) hours as needed for pain.   PROCHLORPERAZINE (COMPAZINE) 10 MG TABLET    Take 10 mg by mouth every 6 (six) hours as needed.    ROSUVASTATIN (CRESTOR) 20 MG TABLET    Take 1 tablet (20 mg total) by mouth daily.   SILVER SULFADIAZINE (SILVADENE) 1 % CREAM    As directed   VENLAFAXINE XR (EFFEXOR-XR) 75 MG 24 HR CAPSULE    Take 1 capsule (75 mg total) by mouth daily.  Modified Medications   Modified Medication Previous Medication   DEXAMETHASONE (DECADRON) 4 MG TABLET dexamethasone (DECADRON) 4 MG tablet      TAKE 2 TABLETS BY MOUTH ONCE A DAY ON THE EVENING OF CHEMOTHERAPY AND THEN TAKE 2 TABLETS TWO TIMES A DAY FOR 2 DAYS. TAKE WITH FOOD.    As needed   LORAZEPAM (ATIVAN) 0.5 MG TABLET LORazepam (ATIVAN) 0.5 MG tablet      Take 1 tablet (0.5 mg total) by mouth  at bedtime as needed for anxiety (Nausea or vomiting).    Take 1 tablet (0.5 mg total) by mouth at bedtime as needed for anxiety (Nausea or vomiting).  Discontinued Medications   LORAZEPAM (ATIVAN) 0.5 MG TABLET    Take 1 tablet (0.5 mg total) by mouth  once.

## 2013-02-18 NOTE — Patient Instructions (Addendum)
Today we updated your med list in our EPIC system...    Continue your current medications the same...  Call for any questions...  Let's plan a follow up visit in 6mo, sooner if needed for problems...   

## 2013-02-20 ENCOUNTER — Other Ambulatory Visit: Payer: Self-pay | Admitting: *Deleted

## 2013-02-21 ENCOUNTER — Other Ambulatory Visit: Payer: Self-pay | Admitting: *Deleted

## 2013-02-24 ENCOUNTER — Ambulatory Visit: Payer: Medicare Other

## 2013-02-24 ENCOUNTER — Other Ambulatory Visit: Payer: Medicare Other | Admitting: Lab

## 2013-02-24 ENCOUNTER — Other Ambulatory Visit (HOSPITAL_BASED_OUTPATIENT_CLINIC_OR_DEPARTMENT_OTHER): Payer: Medicare Other | Admitting: Lab

## 2013-02-24 ENCOUNTER — Encounter: Payer: Self-pay | Admitting: Physician Assistant

## 2013-02-24 ENCOUNTER — Ambulatory Visit (HOSPITAL_BASED_OUTPATIENT_CLINIC_OR_DEPARTMENT_OTHER): Payer: Medicare Other | Admitting: Physician Assistant

## 2013-02-24 VITALS — BP 133/74 | HR 105 | Temp 98.4°F | Resp 20 | Ht 69.0 in | Wt 168.9 lb

## 2013-02-24 DIAGNOSIS — C50919 Malignant neoplasm of unspecified site of unspecified female breast: Secondary | ICD-10-CM

## 2013-02-24 DIAGNOSIS — D702 Other drug-induced agranulocytosis: Secondary | ICD-10-CM

## 2013-02-24 DIAGNOSIS — D649 Anemia, unspecified: Secondary | ICD-10-CM | POA: Diagnosis not present

## 2013-02-24 DIAGNOSIS — C773 Secondary and unspecified malignant neoplasm of axilla and upper limb lymph nodes: Secondary | ICD-10-CM

## 2013-02-24 DIAGNOSIS — B370 Candidal stomatitis: Secondary | ICD-10-CM

## 2013-02-24 DIAGNOSIS — C50119 Malignant neoplasm of central portion of unspecified female breast: Secondary | ICD-10-CM

## 2013-02-24 DIAGNOSIS — C50911 Malignant neoplasm of unspecified site of right female breast: Secondary | ICD-10-CM

## 2013-02-24 LAB — CBC WITH DIFFERENTIAL/PLATELET
BASO%: 1.2 % (ref 0.0–2.0)
Eosinophils Absolute: 0 10*3/uL (ref 0.0–0.5)
LYMPH%: 40.3 % (ref 14.0–49.7)
MCHC: 35 g/dL (ref 31.5–36.0)
MONO#: 0.2 10*3/uL (ref 0.1–0.9)
NEUT#: 0.8 10*3/uL — ABNORMAL LOW (ref 1.5–6.5)
Platelets: 234 10*3/uL (ref 145–400)
RBC: 3.12 10*6/uL — ABNORMAL LOW (ref 3.70–5.45)
RDW: 18.8 % — ABNORMAL HIGH (ref 11.2–14.5)
WBC: 1.7 10*3/uL — ABNORMAL LOW (ref 3.9–10.3)

## 2013-02-24 MED ORDER — CIPROFLOXACIN HCL 500 MG PO TABS: 500.0000 mg | ORAL_TABLET | Freq: Two times a day (BID) | ORAL | Status: DC

## 2013-02-24 MED ORDER — FLUCONAZOLE 100 MG PO TABS: ORAL_TABLET | ORAL | Status: DC

## 2013-02-24 NOTE — Progress Notes (Signed)
ID: Debra Mcintyre   DOB: [birth year]  MR#: [id number]  VHQ#:[id number]  PCP: [name], MD GYN: [name], [name], [name],   HISTORY OF PRESENT ILLNESS: Debra Mcintyre had screening mammography at [facility] [place] 04/10/2012 raising the question of some axillary lymph nodes on the right. Additional views of the right axilla and right axillary ultrasound performed 04/04/2012 showed 3 lymph nodes that appeared larger than prior. The largest measured 1.5 cm. 2 of them had cortical thickening. Close followup was suggested, and repeat right axillary ultrasound 06/25/2012 showed no significant change in the lymph nodes in question. Followup ultrasound in 3 months was recommended, but in the interim the patient saw her primary physician, Dr. [name] and ADL, and he set her up for a chest CT on 07/08/2012, which showed a mild asymmetric density in the upper mid right breast. There was a prominent right axillary lymph node measuring 1.9 cm, with a few smaller adjacent nodes asymmetric with compared to the left side. Again, short interval followup was recommended, and on 10/07/2012 the patient had digital right mammography and ultrasonography, now at the breast Center. Dr. [name] noted pleomorphic calcifications spanning an area of 6.3 cm without associated mass. Physical exam was unremarkable. Ultrasound showed an area of adenopathy in the right axilla measuring 1.6 cm, with a second lymph node with a thickened cortex measuring 1.2 cm. No suspicious mass was seen by ultrasound in the right breast.  Biopsy of the larger axillary lymph node on 10/07/2012 showed ([id number]) an invasive ductal carcinoma, triple negative, with an MIB-1 of 66%. Biopsy of the right breast the next day, [id number]) showed invasive ductal carcinoma, grade 3. Breast MRI 10/14/2012 showed a total area of irregular enhancement in the right breast measuring up to 12 cm. MRI guided biopsy of an  area in the upper inner quadrant of the right breast on 10/23/2012 showed ([id number]) ductal carcinoma in situ, with foci worrisome for invasion.  The patient's subsequent history is as detailed below  INTERVAL HISTORY: Debra Mcintyre returns today for followup of her locally advanced right breast cancer.  She has received 6 of 12 planned  weekly doses of carboplatin/paclitaxel given in the neoadjuvant setting.  She was beginning to have some problems tolerating treatment, and Dr. Darnelle Catalan decided last week to give her a break from treatment today. This was a great decision based on Parker's visit today.  She has become increasingly tired. Her hemoglobin has decreased a little more, and she is having more shortness of breath with exertion. She's also developed neutropenia, but fortunately has had no fevers or chills.  REVIEW OF SYSTEMS: [name] has noted some numbness in her toes bilaterally, and this is more noticeable when she takes her shoes off at night. It is not affecting her walking, and thus far, her upper extremities have been spared. She's had no abnormal bruising or bleeding. She's had no skin changes or rashes, and specifically denies any cracking, peeling, or redness on the hands and feet. Her mouth has been very tender, and she has had difficulty eating anything that soft foods. Fortunately, she's had no nausea or emesis. She's having regular bowel movements. She has a runny nose.  She's had no coughing or phlegm production, but as noted above, has had increased shortness of breath with exertion. She's had no chest pain or palpitations. She does have some heartburn on occasion. She denies any abnormal headaches or dizziness, and continues to follow her blood  pressure closely. She also denies any peripheral swelling.  A detailed review of systems is otherwise stable and noncontributory.   PAST MEDICAL HISTORY: Past Medical History  Diagnosis Date  . Hypertension   . Cerebrovascular disease,  unspecified   . Cerebral aneurysm, nonruptured   . Pure hypercholesterolemia   . Hypothyroidism   . Colonic polyp   . Breast cancer 09/2012    right breast/right axillary lymph node t2,pn1, stage 11b, invasive ductal carcinma, grade 3, triple negative, with an mib-1 of 15%  . History of chemotherapy     PAST SURGICAL HISTORY: Past Surgical History  Procedure Laterality Date  . Left middle cerebral artery angioplasty  2004    by [name] had 2 follow up occurances  . Portacath placement N/A 11/05/2012    Procedure: INSERTION PORT-A-CATH;  Surgeon: [name], MD;  Location: [facility];  Service: General;  Laterality: N/A;  . Breast biopsy Right 10/23/2012  . Breast biopsy Right 10/08/2012    FAMILY HISTORY Family History  Problem Relation Age of Onset  . Lung cancer Mother 67  . Heart disease Father   . Colon cancer Brother 73  . Alcohol abuse Brother   . Colon cancer Maternal Uncle     dx in his 11s  . Cancer Maternal Aunt     unknown type  . Ovarian cancer Other 55   the patient's father died at the age of 58. The patient's mother died at the age of 8 from lung cancer. She was not a smoker. The cancer was diagnosed when she was 70 years old. The patient's mother had 2 sisters and one brother. One of those sisters had lung cancer and a brother had colon cancer, both diagnosed in their 29s. The patient herself has one brother who died from colon cancer at the age of 87. That brother has a daughter who was diagnosed with uterine cancer at the age of 45. All this raises the question of possible Lynch syndrome and I have referred the patient for genetics counseling.  GYNECOLOGIC HISTORY: Menarche age 55, menopause around 82. The patient is GX P0. She took birth control pills for many years with no complications.  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 [name] who lives in "[place]" and is financial vice president of [employer], and Harwick who lives in [place] and works as a Transport planner. The patient has 5 grandchildren. She is currently not a church attender   ADVANCED DIRECTIVES: Not in place; this was discussed 10/29/2012 and the patient was advised to complete a healthcare power of attorney document  HEALTH MAINTENANCE: History  Substance Use Topics  . Smoking status: Former Smoker -- 1.00 packs/day for 20 years    Types: Cigarettes    Quit date: 04/02/1986  . Smokeless tobacco: Never Used  . Alcohol Use: No     Colonoscopy: 2012  PAP: 2012  Bone density:  Lipid panel:  No Known Allergies  Current Outpatient Prescriptions  Medication Sig Dispense Refill  . acyclovir (ZOVIRAX) 400 MG tablet Take 1 tablet (400 mg total) by mouth 2 (two) times daily.  60 tablet  2  . Alum & Mag Hydroxide-Simeth (MAGIC MOUTHWASH W/LIDOCAINE) SOLN Take 5 mLs by mouth 3 (three) times daily as needed.  100 mL  1  . aspirin 325 MG tablet Take 325 mg by mouth daily.        Marland Kitchen  Calcium Carbonate-Vitamin D (CALCIUM 600+D) 600-200 MG-UNIT TABS Take 1 tablet by mouth 2 (two) times daily.        . cephALEXin (KEFLEX) 500 MG capsule Take 1 capsule (500 mg total) by mouth 3 (three) times daily.  21 capsule  0  . ciprofloxacin (CIPRO) 500 MG tablet Take 1 tablet (500 mg total) by mouth 2 (two) times daily.  14 tablet  3  . clopidogrel (PLAVIX) 75 MG tablet TAKE ONE TABLET BY MOUTH EVERY DAY  30 tablet  6  . dexamethasone (DECADRON) 4 MG tablet TAKE 2 TABLETS BY MOUTH ONCE A DAY ON THE EVENING OF CHEMOTHERAPY AND THEN TAKE 2 TABLETS TWO TIMES A DAY FOR 2 DAYS. TAKE WITH FOOD.  30 tablet  2  . famotidine (PEPCID) 40 MG tablet Take 1 tablet (40 mg total) by mouth daily.  30 tablet  3  . fluconazole (DIFLUCAN) 100 MG tablet 2 tabs by mouth x 1 day, then 1 tab by mouth daily  8 tablet  3  . levothyroxine (SYNTHROID, LEVOTHROID) 125 MCG  tablet Take 1 tablet (125 mcg total) by mouth daily.  30 tablet  11  . lidocaine-prilocaine (EMLA) cream Apply topically as needed. Apply over port 1-2 hors before chemo and cover with plastic wrap  30 g  0  . lisinopril (PRINIVIL) 10 MG tablet Take 1 tablet (10 mg total) by mouth daily.  30 tablet  3  . lisinopril-hydrochlorothiazide (PRINZIDE,ZESTORETIC) 20-12.5 MG per tablet       . loratadine (CLARITIN) 10 MG tablet Take 10 mg by mouth daily.      Marland Kitchen LORazepam (ATIVAN) 0.5 MG tablet Take 1 tablet (0.5 mg total) by mouth at bedtime as needed for anxiety (Nausea or vomiting).  30 tablet  1  . metoprolol succinate (TOPROL-XL) 100 MG 24 hr tablet TAKE ONE TABLET BY MOUTH EVERY DAY  30 tablet  11  . metroNIDAZOLE (METROCREAM) 0.75 % cream As directed      . Multiple Vitamin (MULTIVITAMIN) capsule Take 1 capsule by mouth daily.        . Naproxen Sodium (ALEVE) 220 MG CAPS Take 1 capsule by mouth as needed.      . ondansetron (ZOFRAN) 8 MG tablet 1 tab by mouth twice daily x 3 days after chemo, then every 12 hours if needed for nausea  30 tablet  1  . oxyCODONE-acetaminophen (ROXICET) 5-325 MG per tablet Take 1 tablet by mouth every 8 (eight) hours as needed for pain.  24 tablet  0  . prochlorperazine (COMPAZINE) 10 MG tablet Take 10 mg by mouth every 6 (six) hours as needed.       . rosuvastatin (CRESTOR) 20 MG tablet Take 1 tablet (20 mg total) by mouth daily.  30 tablet  11  . silver sulfADIAZINE (SILVADENE) 1 % cream As directed      . venlafaxine XR (EFFEXOR-XR) 75 MG 24 hr capsule Take 1 capsule (75 mg total) by mouth daily.  30 capsule  6   No current facility-administered medications for this visit.    OBJECTIVE: Middle-aged white woman who appears tired but is in no acute distress Filed Vitals:   02/24/13 1208  BP: 133/74  Pulse: 105  Temp: 98.4 F (36.9 C)  Resp: 20     Body mass index is 24.93 kg/(m^2).    ECOG FS: 1 Filed Weights   02/24/13 1208  Weight: 168 lb 14.4 oz (76.613  kg)   Sclerae unicteric Oropharynx notable for  white patches on the tongue and posterior oropharynx, no ulcerations noted No cervical or supraclavicular adenopathy; Lungs clear to auscultation bilaterally, no wheezes or rhonchi Heart regular rate and rhythm, no murmur appreciated Abdomen soft, nontender, positive bowel sounds MSK no focal spinal tenderness, No evidence of palmar or pantar erythrodysesthesia No peripheral edema Neuro: nonfocal, well oriented, pleasant affect Breasts:  Deferred. Axillae are benign bilaterally, with no palpable adenopathy Port is intact in the left upper chest wall, with no erythema or edema, no evidence of infection.    LAB RESULTS: Lab Results  Component Value Date   WBC 1.7* 02/24/2013   NEUTROABS 0.8* 02/24/2013   HGB 9.8* 02/24/2013   HCT 28.0* 02/24/2013   MCV 89.8 02/24/2013   PLT 234 02/24/2013      Chemistry      Component Value Date/Time   NA 132* 02/17/2013 1157   NA 138 06/04/2012 1124   K 3.8 02/17/2013 1157   K 4.3 06/04/2012 1124   CL 102 01/28/2013 0909   CL 103 06/04/2012 1124   CO2 25 02/17/2013 1157   CO2 29 06/04/2012 1124   BUN 17.4 02/17/2013 1157   BUN 14 06/04/2012 1124   CREATININE 0.7 02/17/2013 1157   CREATININE 0.7 06/04/2012 1124      Component Value Date/Time   CALCIUM 9.0 02/17/2013 1157   CALCIUM 9.9 06/04/2012 1124   ALKPHOS 49 02/17/2013 1157   ALKPHOS 47 06/04/2012 1124   AST 22 02/17/2013 1157   AST 27 06/04/2012 1124   ALT 28 02/17/2013 1157   ALT 32 06/04/2012 1124   BILITOT 0.96 02/17/2013 1157   BILITOT 1.1 06/04/2012 1124       STUDIES: Ir Cv Line Injection  01/28/2013   *RADIOLOGY REPORT*  Clinical Data: Breast carcinoma.  Port placement 11/05/2012. Inability to flush aspirate.  port catheter injection under fluoroscopy  Comparison: 11/05/2012  Technique and findings:  Initial fluoroscopic inspection demonstrated stable position of the left subclavian port catheter, tip in the distal SVC.  The access needle  appears well positioned. The port could be aspirated and flushed without difficulty.  Under fluoroscopy, water-soluble contrast was injected through the catheter. No leak.  No evidence of encasing fibrin sheath or thrombus.  Venous outflow is widely patent into the right atrium.  IMPRESSION:  Left subclavian port catheter remains well positioned and widely patent without apparent complication. I discussed the  test results over the telephone with Dr. Darnelle Catalan at the time of interpretation.   Original Report Authenticated By: D. Andria Rhein, MD     ASSESSMENT: 72 y.o. [place] woman   (1)  status post right breast and right axillary lymph node biopsies February 2014 of a clinical T2, pN1, stage IIB,  invasive ductal carcinoma, grade 3, triple negative, with an MIB-1 of 15%  (2)  a third biopsy from a different quadrant 10/23/2012 showed ductal carcinoma in situ  (3)  currently being treated in the neoadjuvant setting, completed 4 dose dense cycles of doxorubicin/ cyclophosphamide 12/23/2012, followed by 12 weekly doses of paclitaxel and carboplatin started 01/07/2013  (4) family history suggestive of Lynch syndrome; genetics evaluation pending  (5)  Afebrile neutropenia, chemo-induced  (6) Oropharyngeal candidiasis  (7)  Anemia, mildly symptomatic   PLAN:  Fortunately, we were already planning on holding Oberia's  treatment today.  We reviewed neutropenic precautions, and she is starting on Cipro prophylactically, 500 mg by mouth twice a day for 7 days. She knows to call any fevers of 100 or  above. I'm also starting her on fluconazole for oral thrush, and she will continue to utilize Magic mouthwash as needed for the discomfort. I encouraged her to keep herself well hydrated as well.  According to Dr. Darrall Dears plan, Heidy will have her seventh treatment of carboplatin/paclitaxel on July 21, and he will see her prior to treatment that day with repeat labs. We will try to get 4 more  treatments which takes [place] to August 11.  She will be out of town between August 18 and September 2.  After she comes back, assuming no neuropathy has developed, we will do the last 2 treatments on September 4 and September 11.  Soon thereafter, she'll be ready for definitive surgery.  Lexee voices understanding of this plan and knows to call for any problems that may develop before her next visit here.   Jaymes Revels PA-C   02/24/2013

## 2013-02-25 ENCOUNTER — Telehealth: Payer: Self-pay | Admitting: Oncology

## 2013-02-25 NOTE — Telephone Encounter (Signed)
Added appts for 9/4 and 9/11. Called pt home number but was not able to reach her or lm. lmonvm on cell re add on appts for 9/4 and 9/11. Other appts remain the same. Confirmed next appt for 7/21 and pt to get new schedule when she comes in.

## 2013-02-26 ENCOUNTER — Other Ambulatory Visit: Payer: Self-pay | Admitting: Family

## 2013-03-03 ENCOUNTER — Other Ambulatory Visit (HOSPITAL_BASED_OUTPATIENT_CLINIC_OR_DEPARTMENT_OTHER): Payer: Medicare Other | Admitting: Lab

## 2013-03-03 ENCOUNTER — Other Ambulatory Visit: Payer: Medicare Other | Admitting: Lab

## 2013-03-03 ENCOUNTER — Ambulatory Visit (HOSPITAL_BASED_OUTPATIENT_CLINIC_OR_DEPARTMENT_OTHER): Payer: Medicare Other | Admitting: Oncology

## 2013-03-03 ENCOUNTER — Ambulatory Visit (HOSPITAL_BASED_OUTPATIENT_CLINIC_OR_DEPARTMENT_OTHER): Payer: Medicare Other

## 2013-03-03 ENCOUNTER — Other Ambulatory Visit (HOSPITAL_COMMUNITY): Payer: Self-pay | Admitting: Oncology

## 2013-03-03 VITALS — BP 148/79 | HR 103 | Temp 97.8°F | Resp 20 | Ht 69.0 in | Wt 159.4 lb

## 2013-03-03 DIAGNOSIS — C50119 Malignant neoplasm of central portion of unspecified female breast: Secondary | ICD-10-CM

## 2013-03-03 DIAGNOSIS — C773 Secondary and unspecified malignant neoplasm of axilla and upper limb lymph nodes: Secondary | ICD-10-CM | POA: Diagnosis not present

## 2013-03-03 DIAGNOSIS — Z5111 Encounter for antineoplastic chemotherapy: Secondary | ICD-10-CM

## 2013-03-03 DIAGNOSIS — C50111 Malignant neoplasm of central portion of right female breast: Secondary | ICD-10-CM

## 2013-03-03 DIAGNOSIS — R599 Enlarged lymph nodes, unspecified: Secondary | ICD-10-CM

## 2013-03-03 DIAGNOSIS — C50911 Malignant neoplasm of unspecified site of right female breast: Secondary | ICD-10-CM

## 2013-03-03 DIAGNOSIS — C50919 Malignant neoplasm of unspecified site of unspecified female breast: Secondary | ICD-10-CM

## 2013-03-03 LAB — CBC WITH DIFFERENTIAL/PLATELET
Basophils Absolute: 0 10*3/uL (ref 0.0–0.1)
Eosinophils Absolute: 0 10*3/uL (ref 0.0–0.5)
HGB: 10.6 g/dL — ABNORMAL LOW (ref 11.6–15.9)
MCV: 89.9 fL (ref 79.5–101.0)
MONO%: 27.2 % — ABNORMAL HIGH (ref 0.0–14.0)
NEUT#: 1 10*3/uL — ABNORMAL LOW (ref 1.5–6.5)
RDW: 17.3 % — ABNORMAL HIGH (ref 11.2–14.5)

## 2013-03-03 MED ORDER — ONDANSETRON 16 MG/50ML IVPB (CHCC)
16.0000 mg | Freq: Once | INTRAVENOUS | Status: AC
  Administered 2013-03-03: 16 mg via INTRAVENOUS

## 2013-03-03 MED ORDER — DEXAMETHASONE SODIUM PHOSPHATE 20 MG/5ML IJ SOLN
20.0000 mg | Freq: Once | INTRAMUSCULAR | Status: AC
  Administered 2013-03-03: 20 mg via INTRAVENOUS

## 2013-03-03 MED ORDER — SODIUM CHLORIDE 0.9 % IV SOLN
50.0000 mg | Freq: Once | INTRAVENOUS | Status: AC
  Administered 2013-03-03: 50 mg via INTRAVENOUS
  Filled 2013-03-03: qty 2

## 2013-03-03 MED ORDER — SODIUM CHLORIDE 0.9 % IV SOLN
230.0000 mg | Freq: Once | INTRAVENOUS | Status: AC
  Administered 2013-03-03: 230 mg via INTRAVENOUS
  Filled 2013-03-03: qty 23

## 2013-03-03 MED ORDER — DIPHENHYDRAMINE HCL 50 MG/ML IJ SOLN
25.0000 mg | Freq: Once | INTRAMUSCULAR | Status: AC
  Administered 2013-03-03: 25 mg via INTRAVENOUS

## 2013-03-03 MED ORDER — LORAZEPAM 1 MG PO TABS
0.5000 mg | ORAL_TABLET | Freq: Once | ORAL | Status: AC
  Administered 2013-03-03: 0.5 mg via ORAL

## 2013-03-03 MED ORDER — LORAZEPAM 1 MG PO TABS
0.5000 mg | ORAL_TABLET | Freq: Once | ORAL | Status: AC
  Administered 2013-03-03: 0.5 mg via SUBLINGUAL

## 2013-03-03 MED ORDER — SODIUM CHLORIDE 0.9 % IV SOLN
Freq: Once | INTRAVENOUS | Status: AC
  Administered 2013-03-03: 12:00:00 via INTRAVENOUS

## 2013-03-03 MED ORDER — FAMOTIDINE IN NACL 20-0.9 MG/50ML-% IV SOLN: 20.0000 mg | Freq: Once | INTRAVENOUS | Status: DC

## 2013-03-03 MED ORDER — SODIUM CHLORIDE 0.9 % IV SOLN
80.0000 mg/m2 | Freq: Once | INTRAVENOUS | Status: AC
  Administered 2013-03-03: 150 mg via INTRAVENOUS
  Filled 2013-03-03: qty 25

## 2013-03-03 MED ORDER — SODIUM CHLORIDE 0.9 % IJ SOLN
10.0000 mL | INTRAMUSCULAR | Status: DC | PRN
  Administered 2013-03-03: 10 mL
  Filled 2013-03-03: qty 10

## 2013-03-03 MED ORDER — HEPARIN SOD (PORK) LOCK FLUSH 100 UNIT/ML IV SOLN
500.0000 [IU] | Freq: Once | INTRAVENOUS | Status: AC | PRN
  Administered 2013-03-03: 500 [IU]
  Filled 2013-03-03: qty 5

## 2013-03-03 NOTE — Progress Notes (Signed)
ID: Debra Mcintyre   DOB: 1940-09-27  MR#: [id number]  BMW#:[id number]  PCP: Debra Mcalpine, MD GYN: Debra Mcintyre SU: Debra Mcintyre OTHER MD: Debra Mcintyre, Debra Mcintyre, Debra Mcintyre,   HISTORY OF PRESENT ILLNESS: Debra Mcintyre had screening mammography at [facility] 04/10/2012 raising the question of some axillary lymph nodes on the right. Additional views of the right axilla and right axillary ultrasound performed 04/04/2012 showed 3 lymph nodes that appeared larger than prior. The largest measured 1.5 cm. 2 of them had cortical thickening. Close followup was suggested, and repeat right axillary ultrasound 06/25/2012 showed no significant change in the lymph nodes in question. Followup ultrasound in 3 months was recommended, but in the interim the patient saw her primary physician, Dr. Lorin Mcintyre and ADL, and he set her up for a chest CT on 07/08/2012, which showed a mild asymmetric density in the upper mid right breast. There was a prominent right axillary lymph node measuring 1.9 cm, with a few smaller adjacent nodes asymmetric with compared to the left side. Again, short interval followup was recommended, and on 10/07/2012 the patient had digital right mammography and ultrasonography, now at the breast Center. Dr. Judyann Mcintyre noted pleomorphic calcifications spanning an area of 6.3 cm without associated mass. Physical exam was unremarkable. Ultrasound showed an area of adenopathy in the right axilla measuring 1.6 cm, with a second lymph node with a thickened cortex measuring 1.2 cm. No suspicious mass was seen by ultrasound in the right breast.  Biopsy of the larger axillary lymph node on 10/07/2012 showed ([id number]) an invasive ductal carcinoma, triple negative, with an MIB-1 of 66%. Biopsy of the right breast the next day, [id number]) showed invasive ductal carcinoma, grade 3. Breast MRI 10/14/2012 showed a total area of irregular enhancement in the right breast measuring up to 12 cm. MRI guided biopsy of an  area in the upper inner quadrant of the right breast on 10/23/2012 showed ([id number]) ductal carcinoma in situ, with foci worrisome for invasion.  The patient's subsequent history is as detailed below  INTERVAL HISTORY: Debra Mcintyre returns today for followup of her locally advanced right breast cancer.  She received 6 .weekly doses of carboplatin/paclitaxel then treatment was held for a week. This allowed her to recover sufficiently that she is ready to resume therapy today.  REVIEW OF SYSTEMS: Debra Mcintyre. Still has numbness in her toes and fingertips. She has feeling in her toes--it's just that she cant tell if she has socks on or not. There is no tingling or pain and no feeling like her feet are frozen. She is still weak, but does her usual houhold chores. She is walking her dog only once daily (instead of the former 3 times a day). Vision is a bit blurred, appetite is down, she has red spots on her legs, had thrush on her tongue, now resolved. Otherwise a detailed ROS today was stable  PAST MEDICAL HISTORY: Past Medical History  Diagnosis Date  . Hypertension   . Cerebrovascular disease, unspecified   . Cerebral aneurysm, nonruptured   . Pure hypercholesterolemia   . Hypothyroidism   . Colonic polyp   . Breast cancer 09/2012    right breast/right axillary lymph node t2,pn1, stage 11b, invasive ductal carcinma, grade 3, triple negative, with an mib-1 of 15%  . History of chemotherapy     PAST SURGICAL HISTORY: Past Surgical History  Procedure Laterality Date  . Left middle cerebral artery angioplasty  2004    by [name] had 2 follow up occurances  .  Portacath placement N/A 11/05/2012    Procedure: INSERTION PORT-A-CATH;  Surgeon: Debra Loron, MD;  Location: [facility];  Service: General;  Laterality: N/A;  . Breast biopsy Right 10/23/2012  . Breast biopsy Right 10/08/2012    FAMILY HISTORY Family History  Problem Relation Age of Onset  . Lung cancer Mother 32   . Heart disease Father   . Colon cancer Brother 85  . Alcohol abuse Brother   . Colon cancer Maternal Uncle     dx in his 76s  . Cancer Maternal Aunt     unknown type  . Ovarian cancer Other 47   the patient's father died at the age of 37. The patient's mother died at the age of 39 from lung cancer. She was not a smoker. The cancer was diagnosed when she was 69 years old. The patient's mother had 2 sisters and one brother. One of those sisters had lung cancer and a brother had colon cancer, both diagnosed in their 103s. The patient herself has one brother who died from colon cancer at the age of 78. That brother has a daughter who was diagnosed with uterine cancer at the age of 46. All this raises the question of possible Lynch syndrome and I have referred the patient for genetics counseling.  GYNECOLOGIC HISTORY: Menarche age 62, menopause around 71. The patient is GX P0. She took birth control pills for many years with no complications.  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 [name] who lives in "[place]" and is financial vice president of [employer], and Kenneth who lives in [place] and works as a Transport planner. The patient has 5 grandchildren. She is currently not a church attender   ADVANCED DIRECTIVES: Not in place; this was discussed 10/29/2012 and the patient was advised to complete a healthcare power of attorney document  HEALTH MAINTENANCE: History  Substance Use Topics  . Smoking status: Former Smoker -- 1.00 packs/day for 20 years    Types: Cigarettes    Quit date: 04/02/1986  . Smokeless tobacco: Never Used  . Alcohol Use: No     Colonoscopy: 2012  PAP: 2012  Bone density:  Lipid panel:  No Known Allergies  Current Outpatient Prescriptions  Medication Sig Dispense Refill  . acyclovir (ZOVIRAX) 400 MG tablet TAKE ONE TABLET BY MOUTH TWICE  DAILY  60 tablet  0  . Alum & Mag Hydroxide-Simeth (MAGIC MOUTHWASH W/LIDOCAINE) SOLN Take 5 mLs by mouth 3 (three) times daily as needed.  100 mL  1  . aspirin 325 MG tablet Take 325 mg by mouth daily.        . Calcium Carbonate-Vitamin D (CALCIUM 600+D) 600-200 MG-UNIT TABS Take 1 tablet by mouth 2 (two) times daily.        . cephALEXin (KEFLEX) 500 MG capsule Take 1 capsule (500 mg total) by mouth 3 (three) times daily.  21 capsule  0  . ciprofloxacin (CIPRO) 500 MG tablet Take 1 tablet (500 mg total) by mouth 2 (two) times daily.  14 tablet  3  . clopidogrel (PLAVIX) 75 MG tablet TAKE ONE TABLET BY MOUTH EVERY DAY  30 tablet  6  . dexamethasone (DECADRON) 4 MG tablet TAKE 2 TABLETS BY MOUTH ONCE A DAY ON THE EVENING OF CHEMOTHERAPY AND THEN TAKE 2 TABLETS TWO TIMES A DAY FOR 2 DAYS. TAKE WITH FOOD.  30  tablet  2  . famotidine (PEPCID) 40 MG tablet Take 1 tablet (40 mg total) by mouth daily.  30 tablet  3  . fluconazole (DIFLUCAN) 100 MG tablet 2 tabs by mouth x 1 day, then 1 tab by mouth daily  8 tablet  3  . levothyroxine (SYNTHROID, LEVOTHROID) 125 MCG tablet Take 1 tablet (125 mcg total) by mouth daily.  30 tablet  11  . lidocaine-prilocaine (EMLA) cream Apply topically as needed. Apply over port 1-2 hors before chemo and cover with plastic wrap  30 g  0  . lisinopril (PRINIVIL) 10 MG tablet Take 1 tablet (10 mg total) by mouth daily.  30 tablet  3  . lisinopril-hydrochlorothiazide (PRINZIDE,ZESTORETIC) 20-12.5 MG per tablet       . loratadine (CLARITIN) 10 MG tablet Take 10 mg by mouth daily.      Marland Kitchen LORazepam (ATIVAN) 0.5 MG tablet Take 1 tablet (0.5 mg total) by mouth at bedtime as needed for anxiety (Nausea or vomiting).  30 tablet  1  . metoprolol succinate (TOPROL-XL) 100 MG 24 hr tablet TAKE ONE TABLET BY MOUTH EVERY DAY  30 tablet  11  . metroNIDAZOLE (METROCREAM) 0.75 % cream As directed      . Multiple Vitamin (MULTIVITAMIN) capsule Take 1 capsule by mouth daily.        . Naproxen  Sodium (ALEVE) 220 MG CAPS Take 1 capsule by mouth as needed.      . ondansetron (ZOFRAN) 8 MG tablet 1 tab by mouth twice daily x 3 days after chemo, then every 12 hours if needed for nausea  30 tablet  1  . oxyCODONE-acetaminophen (ROXICET) 5-325 MG per tablet Take 1 tablet by mouth every 8 (eight) hours as needed for pain.  24 tablet  0  . prochlorperazine (COMPAZINE) 10 MG tablet Take 10 mg by mouth every 6 (six) hours as needed.       . rosuvastatin (CRESTOR) 20 MG tablet Take 1 tablet (20 mg total) by mouth daily.  30 tablet  11  . silver sulfADIAZINE (SILVADENE) 1 % cream As directed      . venlafaxine XR (EFFEXOR-XR) 75 MG 24 hr capsule Take 1 capsule (75 mg total) by mouth daily.  30 capsule  6   No current facility-administered medications for this visit.    OBJECTIVE: Middle-aged white woman in no acute distress  Filed Vitals:   03/03/13 1045  BP: 148/79  Pulse: 103  Temp: 97.8 F (36.6 C)  Resp: 20     Body mass index is 23.53 kg/(m^2).    ECOG FS: 1 Filed Weights   03/03/13 1045  Weight: 159 lb 6.4 oz (72.303 kg)   Sclerae unicteric Oropharynx clear No cervical or supraclavicular adenopathy; Lungs clear to auscultation, good excursion bilaterally Heart regular rate and rhythm, no murmur appreciated Abdomen soft, nontender, positive bowel sounds MSK no focal spinal tenderness, No palmar or pantar erythrodysesthesia No peripheral edema Neuro: nonfocal, well oriented, pleasant affect; minimal loss of sensation over fingetips Breasts:  Deferred. Samuel Bouche is intact in the left upper chest wall    LAB RESULTS: Lab Results  Component Value Date   WBC 1.7* 02/24/2013   NEUTROABS 0.8* 02/24/2013   HGB 9.8* 02/24/2013   HCT 28.0* 02/24/2013   MCV 89.8 02/24/2013   PLT 234 02/24/2013      Chemistry      Component Value Date/Time   NA 132* 02/17/2013 1157   NA 138 06/04/2012 1124  K 3.8 02/17/2013 1157   K 4.3 06/04/2012 1124   CL 102 01/28/2013 0909   CL 103 06/04/2012  1124   CO2 25 02/17/2013 1157   CO2 29 06/04/2012 1124   BUN 17.4 02/17/2013 1157   BUN 14 06/04/2012 1124   CREATININE 0.7 02/17/2013 1157   CREATININE 0.7 06/04/2012 1124      Component Value Date/Time   CALCIUM 9.0 02/17/2013 1157   CALCIUM 9.9 06/04/2012 1124   ALKPHOS 49 02/17/2013 1157   ALKPHOS 47 06/04/2012 1124   AST 22 02/17/2013 1157   AST 27 06/04/2012 1124   ALT 28 02/17/2013 1157   ALT 32 06/04/2012 1124   BILITOT 0.96 02/17/2013 1157   BILITOT 1.1 06/04/2012 1124       STUDIES: No results found.   ASSESSMENT: 72 y.o. [place] woman   (1)  status post right breast and right axillary lymph node biopsies February 2014 of a clinical T2, pN1, stage IIB,  invasive ductal carcinoma, grade 3, triple negative, with an MIB-1 of 15%  (2)  a third biopsy from a different quadrant 10/23/2012 showed ductal carcinoma in situ  (3)  currently being treated in the neoadjuvant setting, completed 4 dose dense cycles of doxorubicin/ cyclophosphamide 12/23/2012, followed by 12 planned weekly doses of paclitaxel and carboplatin started 01/07/2013  (4) family history suggestive of Lynch syndrome; genetics evaluation pending  (5)  Afebrile neutropenia, chemo-induced  (6) Oropharyngeal candidiasis  (7)  Anemia, mildly symptomatic   PLAN:  Debra Mcintyre will proceed to her 7th cycle of carboplatin and paclitaxel today. She will see me next week. If she has worsening neuropathy we will consider switching to carbo/ gemzar for the remaining treatments. She has a trip planned Aug 30 - Sept 2, and we will have to make sure we don't crowd those dates. Another option if she develop=s worsening neuropathy may be to repeat a breast MRI and assess response to this point.  She knows to call for any problems that may develop before the next visti here  MAGRINAT,GUSTAV C MD   03/03/2013

## 2013-03-03 NOTE — Progress Notes (Signed)
Ok to trreat per Dr. Darnelle Catalan.

## 2013-03-03 NOTE — Patient Instructions (Addendum)
[facility] Discharge Instructions for Patients Receiving Chemotherapy  Today you received the following chemotherapy agents Taxol/Carboplatin To help prevent nausea and vomiting after your treatment, we encourage you to take your nausea medication as prescribed.  If you develop nausea and vomiting that is not controlled by your nausea medication, call the clinic.   BELOW ARE SYMPTOMS THAT SHOULD BE REPORTED IMMEDIATELY:  *FEVER GREATER THAN 100.5 F  *CHILLS WITH OR WITHOUT FEVER  NAUSEA AND VOMITING THAT IS NOT CONTROLLED WITH YOUR NAUSEA MEDICATION  *UNUSUAL SHORTNESS OF BREATH  *UNUSUAL BRUISING OR BLEEDING  TENDERNESS IN MOUTH AND THROAT WITH OR WITHOUT PRESENCE OF ULCERS  *URINARY PROBLEMS  *BOWEL PROBLEMS  UNUSUAL RASH Items with * indicate a potential emergency and should be followed up as soon as possible.  Feel free to call the clinic you have any questions or concerns. The clinic phone number is [phone].    

## 2013-03-04 ENCOUNTER — Ambulatory Visit (HOSPITAL_BASED_OUTPATIENT_CLINIC_OR_DEPARTMENT_OTHER): Payer: Medicare Other

## 2013-03-04 VITALS — BP 143/82 | HR 101 | Temp 98.2°F

## 2013-03-04 DIAGNOSIS — D702 Other drug-induced agranulocytosis: Secondary | ICD-10-CM | POA: Diagnosis not present

## 2013-03-04 DIAGNOSIS — C50119 Malignant neoplasm of central portion of unspecified female breast: Secondary | ICD-10-CM

## 2013-03-04 MED ORDER — FILGRASTIM 480 MCG/0.8ML IJ SOLN
480.0000 ug | Freq: Once | INTRAMUSCULAR | Status: AC
  Administered 2013-03-04: 480 ug via SUBCUTANEOUS
  Filled 2013-03-04: qty 0.8

## 2013-03-05 ENCOUNTER — Ambulatory Visit (HOSPITAL_BASED_OUTPATIENT_CLINIC_OR_DEPARTMENT_OTHER): Payer: Medicare Other

## 2013-03-05 VITALS — BP 159/78 | HR 103 | Temp 98.8°F

## 2013-03-05 DIAGNOSIS — C50119 Malignant neoplasm of central portion of unspecified female breast: Secondary | ICD-10-CM

## 2013-03-05 DIAGNOSIS — D702 Other drug-induced agranulocytosis: Secondary | ICD-10-CM | POA: Diagnosis not present

## 2013-03-05 MED ORDER — FILGRASTIM 480 MCG/0.8ML IJ SOLN
480.0000 ug | Freq: Once | INTRAMUSCULAR | Status: AC
  Administered 2013-03-05: 480 ug via SUBCUTANEOUS
  Filled 2013-03-05: qty 0.8

## 2013-03-10 ENCOUNTER — Ambulatory Visit: Payer: Medicare Other

## 2013-03-10 ENCOUNTER — Other Ambulatory Visit (HOSPITAL_BASED_OUTPATIENT_CLINIC_OR_DEPARTMENT_OTHER): Payer: Medicare Other

## 2013-03-10 ENCOUNTER — Other Ambulatory Visit: Payer: Medicare Other | Admitting: Lab

## 2013-03-10 ENCOUNTER — Ambulatory Visit (HOSPITAL_BASED_OUTPATIENT_CLINIC_OR_DEPARTMENT_OTHER): Payer: Medicare Other | Admitting: Oncology

## 2013-03-10 VITALS — BP 134/77 | HR 93 | Temp 98.3°F | Resp 20 | Ht 69.0 in | Wt 161.3 lb

## 2013-03-10 DIAGNOSIS — C50111 Malignant neoplasm of central portion of right female breast: Secondary | ICD-10-CM

## 2013-03-10 DIAGNOSIS — Z171 Estrogen receptor negative status [ER-]: Secondary | ICD-10-CM

## 2013-03-10 DIAGNOSIS — C50119 Malignant neoplasm of central portion of unspecified female breast: Secondary | ICD-10-CM

## 2013-03-10 DIAGNOSIS — G609 Hereditary and idiopathic neuropathy, unspecified: Secondary | ICD-10-CM | POA: Diagnosis not present

## 2013-03-10 DIAGNOSIS — C50919 Malignant neoplasm of unspecified site of unspecified female breast: Secondary | ICD-10-CM

## 2013-03-10 DIAGNOSIS — C773 Secondary and unspecified malignant neoplasm of axilla and upper limb lymph nodes: Secondary | ICD-10-CM

## 2013-03-10 LAB — CBC WITH DIFFERENTIAL/PLATELET
BASO%: 0.6 % (ref 0.0–2.0)
EOS%: 0.6 % (ref 0.0–7.0)
HCT: 31.6 % — ABNORMAL LOW (ref 34.8–46.6)
LYMPH%: 30.8 % (ref 14.0–49.7)
MCH: 29.6 pg (ref 25.1–34.0)
MCHC: 32.9 g/dL (ref 31.5–36.0)
MCV: 90 fL (ref 79.5–101.0)
MONO%: 16.3 % — ABNORMAL HIGH (ref 0.0–14.0)
NEUT%: 51.7 % (ref 38.4–76.8)
Platelets: 214 10*3/uL (ref 145–400)
lymph#: 1.5 10*3/uL (ref 0.9–3.3)

## 2013-03-10 NOTE — Progress Notes (Signed)
ID: Lauralyn Mcintyre   DOB: [birth year]  MR#: [id number]  BMW#:[id number]  PCP: [name], MD GYN: [name], [name], [name],   HISTORY OF PRESENT ILLNESS: Debra Mcintyre had screening mammography at [facility] 04/10/2012 raising the question of some axillary lymph nodes on the right. Additional views of the right axilla and right axillary ultrasound performed 04/04/2012 showed 3 lymph nodes that appeared larger than prior. The largest measured 1.5 cm. 2 of them had cortical thickening. Close followup was suggested, and repeat right axillary ultrasound 06/25/2012 showed no significant change in the lymph nodes in question. Followup ultrasound in 3 months was recommended, but in the interim the patient saw her primary physician, Dr. [name] and ADL, and he set her up for a chest CT on 07/08/2012, which showed a mild asymmetric density in the upper mid right breast. There was a prominent right axillary lymph node measuring 1.9 cm, with a few smaller adjacent nodes asymmetric with compared to the left side. Again, short interval followup was recommended, and on 10/07/2012 the patient had digital right mammography and ultrasonography, now at the breast Center. Dr. [name] noted pleomorphic calcifications spanning an area of 6.3 cm without associated mass. Physical exam was unremarkable. Ultrasound showed an area of adenopathy in the right axilla measuring 1.6 cm, with a second lymph node with a thickened cortex measuring 1.2 cm. No suspicious mass was seen by ultrasound in the right breast.  Biopsy of the larger axillary lymph node on 10/07/2012 showed ([id number]) an invasive ductal carcinoma, triple negative, with an MIB-1 of 66%. Biopsy of the right breast the next day, [id number]) showed invasive ductal carcinoma, grade 3. Breast MRI 10/14/2012 showed a total area of irregular enhancement in the right breast measuring up to 12 cm. MRI guided biopsy of an  area in the upper inner quadrant of the right breast on 10/23/2012 showed ([id number]) ductal carcinoma in situ, with foci worrisome for invasion.  The patient's subsequent history is as detailed below  INTERVAL HISTORY: Debra Mcintyre returns today for followup of her locally advanced right breast cancer accompanied by her husband [name]. Today she was supposed to receive her eighth dose of Taxol and [place]. However, in addition to the significant pain she gets from the Neupogen, she has had worsening neuropathy, and we are stopping her chemotherapy point  REVIEW OF SYSTEMS: Debra Mcintyre she has numbness in her fingertips but more in her feet, halfway up the foot. She does not wear her feet are. She hasn't stumbled or falling. She is sleeping poorly, though she takes Ativan every night. The pain from the Neulasta was in her bones and lasted 4 days. She had to take Percocet 4 times a day to control that. Of course have made her constipated and she took stool softeners for that. She has a runny nose, shortness of breath when walking up stairs, but the worst issue is a neuropathy and that is what is going to keep [place] from giving her any more Taxol at this point  PAST MEDICAL HISTORY: Past Medical History  Diagnosis Date  . Hypertension   . Cerebrovascular disease, unspecified   . Cerebral aneurysm, nonruptured   . Pure hypercholesterolemia   . Hypothyroidism   . Colonic polyp   . Breast cancer 09/2012    right breast/right axillary lymph node t2,pn1, stage 11b, invasive ductal carcinma, grade 3, triple negative, with an mib-1 of 15%  . History of chemotherapy  PAST SURGICAL HISTORY: Past Surgical History  Procedure Laterality Date  . Left middle cerebral artery angioplasty  2004    by [name] had 2 follow up occurances  . Portacath placement N/A 11/05/2012    Procedure: INSERTION PORT-A-CATH;  Surgeon: [name], MD;  Location: [facility];  Service: General;  Laterality: N/A;   . Breast biopsy Right 10/23/2012  . Breast biopsy Right 10/08/2012    FAMILY HISTORY Family History  Problem Relation Age of Onset  . Lung cancer Mother 37  . Heart disease Father   . Colon cancer Brother 46  . Alcohol abuse Brother   . Colon cancer Maternal Uncle     dx in his 69s  . Cancer Maternal Aunt     unknown type  . Ovarian cancer Other 5   the patient's father died at the age of 55. The patient's mother died at the age of 33 from lung cancer. She was not a smoker. The cancer was diagnosed when she was 70 years old. The patient's mother had 2 sisters and one brother. One of those sisters had lung cancer and a brother had colon cancer, both diagnosed in their 36s. The patient herself has one brother who died from colon cancer at the age of 7. That brother has a daughter who was diagnosed with uterine cancer at the age of 65. All this raises the question of possible Lynch syndrome and I have referred the patient for genetics counseling.  GYNECOLOGIC HISTORY: Menarche age 58, menopause around 48. The patient is GX P0. She took birth control pills for many years with no complications.  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 [name] who lives in [place]" and is financial vice president of [employer], and Healy who lives in [place] and works as a Transport planner. The patient has 5 grandchildren. She is currently not a church attender   ADVANCED DIRECTIVES: Not in place; this was discussed 10/29/2012 and the patient was advised to complete a healthcare power of attorney document  HEALTH MAINTENANCE: History  Substance Use Topics  . Smoking status: Former Smoker -- 1.00 packs/day for 20 years    Types: Cigarettes    Quit date: 04/02/1986  . Smokeless tobacco: Never Used  . Alcohol Use: No     Colonoscopy: 2012  PAP: 2012  Bone density:  Lipid  panel:  No Known Allergies  Current Outpatient Prescriptions  Medication Sig Dispense Refill  . acyclovir (ZOVIRAX) 400 MG tablet TAKE ONE TABLET BY MOUTH TWICE DAILY  60 tablet  0  . Alum & Mag Hydroxide-Simeth (MAGIC MOUTHWASH W/LIDOCAINE) SOLN Take 5 mLs by mouth 3 (three) times daily as needed.  100 mL  1  . aspirin 325 MG tablet Take 325 mg by mouth daily.        . Calcium Carbonate-Vitamin D (CALCIUM 600+D) 600-200 MG-UNIT TABS Take 1 tablet by mouth 2 (two) times daily.        . clopidogrel (PLAVIX) 75 MG tablet TAKE ONE TABLET BY MOUTH EVERY DAY  30 tablet  6  . dexamethasone (DECADRON) 4 MG tablet TAKE 2 TABLETS BY MOUTH ONCE A DAY ON THE EVENING OF CHEMOTHERAPY AND THEN TAKE 2 TABLETS TWO TIMES A DAY FOR 2 DAYS. TAKE WITH FOOD.  30 tablet  2  . famotidine (PEPCID) 40 MG tablet Take 1 tablet (40 mg total) by  mouth daily.  30 tablet  3  . fluconazole (DIFLUCAN) 100 MG tablet 2 tabs by mouth x 1 day, then 1 tab by mouth daily  8 tablet  3  . levothyroxine (SYNTHROID, LEVOTHROID) 125 MCG tablet Take 1 tablet (125 mcg total) by mouth daily.  30 tablet  11  . lidocaine-prilocaine (EMLA) cream Apply topically as needed. Apply over port 1-2 hors before chemo and cover with plastic wrap  30 g  0  . lisinopril-hydrochlorothiazide (PRINZIDE,ZESTORETIC) 20-12.5 MG per tablet       . loratadine (CLARITIN) 10 MG tablet Take 10 mg by mouth daily.      Marland Kitchen LORazepam (ATIVAN) 0.5 MG tablet Take 1 tablet (0.5 mg total) by mouth at bedtime as needed for anxiety (Nausea or vomiting).  30 tablet  1  . metoprolol succinate (TOPROL-XL) 100 MG 24 hr tablet TAKE ONE TABLET BY MOUTH EVERY DAY  30 tablet  11  . metroNIDAZOLE (METROCREAM) 0.75 % cream As directed      . Multiple Vitamin (MULTIVITAMIN) capsule Take 1 capsule by mouth daily.        . Naproxen Sodium (ALEVE) 220 MG CAPS Take 1 capsule by mouth as needed.      . ondansetron (ZOFRAN) 8 MG tablet 1 tab by mouth twice daily x 3 days after chemo, then  every 12 hours if needed for nausea  30 tablet  1  . oxyCODONE-acetaminophen (ROXICET) 5-325 MG per tablet Take 1 tablet by mouth every 8 (eight) hours as needed for pain.  24 tablet  0  . prochlorperazine (COMPAZINE) 10 MG tablet Take 10 mg by mouth every 6 (six) hours as needed.       . rosuvastatin (CRESTOR) 20 MG tablet Take 1 tablet (20 mg total) by mouth daily.  30 tablet  11  . venlafaxine XR (EFFEXOR-XR) 75 MG 24 hr capsule Take 1 capsule (75 mg total) by mouth daily.  30 capsule  6   No current facility-administered medications for this visit.    OBJECTIVE: Middle-aged white woman who appears moderately fatigued Filed Vitals:   03/10/13 0833  BP: 134/77  Pulse: 93  Temp: 98.3 F (36.8 C)  Resp: 20     Body mass index is 23.81 kg/(m^2).    ECOG FS: 2 Filed Weights   03/10/13 0833  Weight: 161 lb 4.8 oz (73.165 kg)   Sclerae unicteric, pupils equal round and reactive to light Oropharynx clear No cervical or supraclavicular adenopathy; Lungs clear to auscultation, good excursion bilaterally Heart regular rate and rhythm, no murmur appreciated Abdomen soft, nontender, positive bowel sounds MSK no focal spinal tenderness, no peripheral edema Neuro: nonfocal, well oriented, pleasant affect Breasts:  I do not palpate a mass in the right breast or right axilla. The skin is intact. Left breast is unremarkable Port is intact in the left upper chest wall    LAB RESULTS: Lab Results  Component Value Date   WBC 4.8 03/10/2013   NEUTROABS 2.5 03/10/2013   HGB 10.4* 03/10/2013   HCT 31.6* 03/10/2013   MCV 90.0 03/10/2013   PLT 214 03/10/2013      Chemistry      Component Value Date/Time   NA 132* 02/17/2013 1157   NA 138 06/04/2012 1124   K 3.8 02/17/2013 1157   K 4.3 06/04/2012 1124   CL 102 01/28/2013 0909   CL 103 06/04/2012 1124   CO2 25 02/17/2013 1157   CO2 29 06/04/2012 1124   BUN 17.4  02/17/2013 1157   BUN 14 06/04/2012 1124   CREATININE 0.7 02/17/2013 1157   CREATININE  0.7 06/04/2012 1124      Component Value Date/Time   CALCIUM 9.0 02/17/2013 1157   CALCIUM 9.9 06/04/2012 1124   ALKPHOS 49 02/17/2013 1157   ALKPHOS 47 06/04/2012 1124   AST 22 02/17/2013 1157   AST 27 06/04/2012 1124   ALT 28 02/17/2013 1157   ALT 32 06/04/2012 1124   BILITOT 0.96 02/17/2013 1157   BILITOT 1.1 06/04/2012 1124       STUDIES: No results found.   ASSESSMENT: 72 y.o. [place] woman   (1)  status post right breast and right axillary lymph node biopsies February 2014 of a clinical T2, pN1, stage IIB,  invasive ductal carcinoma, grade 3, triple negative, with an MIB-1 of 15%  (2)  a third biopsy from a different quadrant 10/23/2012 showed ductal carcinoma in situ  (3)  currently being treated in the neoadjuvant setting, completed 4 dose dense cycles of doxorubicin/ cyclophosphamide 12/23/2012, followed by 7 weekly doses of paclitaxel and carboplatin completed 03/03/2013  (4) family history suggestive of Lynch syndrome; genetics evaluation pending   PLAN:  Sherrin has developed significant neuropathy and we are stopping her Taxol chemotherapy at this point. Of course we are stopping the carboplatin as well. I am hoping that she will recover of from the neuropathy within the next few months. At this point we are going to repeat a breast MRI and ask her to see Dr. Dwain Sarna again. She would like to wait until she comes back from her trip late August, and have her surgery the first week in September, perhaps the third or fourth. She understands she will have to be off the Plavix for at least a few days prior to that procedure.  If there is significant amount of residual disease, we can consider 6 doses of weekly carboplatin/gemcitabine postop. That would not be standard. I am hopeful however that she will have had a good enough treatment to her neoadjuvant therapy that we can just start observation after she completes her radiation treatments.  Lowella Dell MD    03/10/2013

## 2013-03-10 NOTE — Addendum Note (Signed)
Addended by: Billey Co on: 03/10/2013 12:29 PM   Modules accepted: Orders, Medications

## 2013-03-12 ENCOUNTER — Telehealth: Payer: Self-pay | Admitting: *Deleted

## 2013-03-12 NOTE — Telephone Encounter (Signed)
sw pt informed her that all her tx, labs, and ov was cancel for 03/17/13. gv appt d/t for [place] on 03/31/13 @11 :10am. Pt is aware...td

## 2013-03-14 ENCOUNTER — Ambulatory Visit
Admission: RE | Admit: 2013-03-14 | Discharge: 2013-03-14 | Disposition: A | Discharge status: Home / Self Care | Payer: Medicare Other | Source: Ambulatory Visit | Attending: Oncology | Admitting: Oncology

## 2013-03-14 DIAGNOSIS — C50119 Malignant neoplasm of central portion of unspecified female breast: Secondary | ICD-10-CM

## 2013-03-14 DIAGNOSIS — C773 Secondary and unspecified malignant neoplasm of axilla and upper limb lymph nodes: Secondary | ICD-10-CM | POA: Diagnosis not present

## 2013-03-14 DIAGNOSIS — C50919 Malignant neoplasm of unspecified site of unspecified female breast: Secondary | ICD-10-CM | POA: Diagnosis not present

## 2013-03-14 MED ORDER — GADOBENATE DIMEGLUMINE 529 MG/ML IV SOLN
15.0000 mL | Freq: Once | INTRAVENOUS | Status: AC | PRN
  Administered 2013-03-14: 15 mL via INTRAVENOUS

## 2013-03-17 ENCOUNTER — Other Ambulatory Visit: Payer: Medicare Other | Admitting: Lab

## 2013-03-17 ENCOUNTER — Ambulatory Visit: Payer: Medicare Other

## 2013-03-17 ENCOUNTER — Ambulatory Visit: Payer: Medicare Other | Admitting: Physician Assistant

## 2013-03-18 DIAGNOSIS — M5137 Other intervertebral disc degeneration, lumbosacral region: Secondary | ICD-10-CM | POA: Diagnosis not present

## 2013-03-19 ENCOUNTER — Telehealth: Payer: Self-pay | Admitting: Pulmonary Disease

## 2013-03-19 DIAGNOSIS — M545 Low back pain: Secondary | ICD-10-CM | POA: Diagnosis not present

## 2013-03-19 DIAGNOSIS — M5137 Other intervertebral disc degeneration, lumbosacral region: Secondary | ICD-10-CM | POA: Diagnosis not present

## 2013-03-19 MED ORDER — LISINOPRIL 20 MG PO TABS: 20.0000 mg | ORAL_TABLET | Freq: Every day | ORAL | Status: DC

## 2013-03-19 NOTE — Telephone Encounter (Signed)
Called and spoke with pt and she stated that she is no longer taking the chemo--she had 15 wks in total.  The chemo caused her to have neuropathy and they felt that she had had enough.    Pt stated that she has been having dizziness/lightheaded and was seen by SN on 02/18/2013.  Discussed her BP at this time.  She stated that in the last couple of days her BP has been elevated at home 181/97 yesterday .  She has been taking the metoprolol 100 mg  Daily----she has taken an extra 50 mg yesterday and BP in the evening was 159/88.  Today she took the normal dose and her BP has been between  151-160/87-96.  Pt wanted to let SN know and get any recs if any that she should be doing.    SN please advise. thanks

## 2013-03-19 NOTE — Telephone Encounter (Signed)
Per SN---  SN does not want to increase the metoprolol past 100 mg daily.   Is she taking the lisinopril 10 mg daily or did they change her back to the lisino/hct?    i called and spoke with pt and she checked her medication bottle and she stated that she is taking the lisinopril 10 mg daily.  SN is aware of this and recs that the pt increase the 10 mg to 20 mg of the lisinopril daily.  i have sent in new rx for this and the pt is aware that these will be the 20 mg tablets and she will only need to take 1 daily.  Pt voiced her understanding of this and is aware to cont to check her BP at home and follow up with any problems. Med list has been updated today.

## 2013-03-20 ENCOUNTER — Other Ambulatory Visit: Payer: Self-pay | Admitting: Pulmonary Disease

## 2013-03-21 DIAGNOSIS — M5137 Other intervertebral disc degeneration, lumbosacral region: Secondary | ICD-10-CM | POA: Diagnosis not present

## 2013-03-21 DIAGNOSIS — M545 Low back pain: Secondary | ICD-10-CM | POA: Diagnosis not present

## 2013-03-24 ENCOUNTER — Other Ambulatory Visit (HOSPITAL_BASED_OUTPATIENT_CLINIC_OR_DEPARTMENT_OTHER): Payer: Medicare Other | Admitting: Lab

## 2013-03-24 ENCOUNTER — Telehealth: Payer: Self-pay | Admitting: *Deleted

## 2013-03-24 ENCOUNTER — Ambulatory Visit: Payer: Medicare Other

## 2013-03-24 ENCOUNTER — Encounter: Payer: Self-pay | Admitting: Physician Assistant

## 2013-03-24 ENCOUNTER — Ambulatory Visit (HOSPITAL_BASED_OUTPATIENT_CLINIC_OR_DEPARTMENT_OTHER): Payer: Medicare Other | Admitting: Physician Assistant

## 2013-03-24 VITALS — BP 100/66 | HR 109 | Temp 99.1°F | Resp 20 | Ht 69.0 in | Wt 158.9 lb

## 2013-03-24 DIAGNOSIS — C50119 Malignant neoplasm of central portion of unspecified female breast: Secondary | ICD-10-CM | POA: Diagnosis not present

## 2013-03-24 DIAGNOSIS — C50919 Malignant neoplasm of unspecified site of unspecified female breast: Secondary | ICD-10-CM

## 2013-03-24 DIAGNOSIS — Z171 Estrogen receptor negative status [ER-]: Secondary | ICD-10-CM

## 2013-03-24 DIAGNOSIS — C773 Secondary and unspecified malignant neoplasm of axilla and upper limb lymph nodes: Secondary | ICD-10-CM

## 2013-03-24 DIAGNOSIS — M545 Low back pain: Secondary | ICD-10-CM | POA: Diagnosis not present

## 2013-03-24 DIAGNOSIS — G620 Drug-induced polyneuropathy: Secondary | ICD-10-CM

## 2013-03-24 DIAGNOSIS — C50911 Malignant neoplasm of unspecified site of right female breast: Secondary | ICD-10-CM

## 2013-03-24 DIAGNOSIS — T451X5A Adverse effect of antineoplastic and immunosuppressive drugs, initial encounter: Secondary | ICD-10-CM

## 2013-03-24 DIAGNOSIS — C50111 Malignant neoplasm of central portion of right female breast: Secondary | ICD-10-CM

## 2013-03-24 DIAGNOSIS — M5137 Other intervertebral disc degeneration, lumbosacral region: Secondary | ICD-10-CM | POA: Diagnosis not present

## 2013-03-24 LAB — CBC WITH DIFFERENTIAL/PLATELET
BASO%: 1.1 % (ref 0.0–2.0)
EOS%: 1.2 % (ref 0.0–7.0)
MCH: 30.9 pg (ref 25.1–34.0)
MCHC: 33.9 g/dL (ref 31.5–36.0)
RBC: 3.78 10*6/uL (ref 3.70–5.45)
RDW: 18.7 % — ABNORMAL HIGH (ref 11.2–14.5)
lymph#: 1.4 10*3/uL (ref 0.9–3.3)

## 2013-03-24 LAB — COMPREHENSIVE METABOLIC PANEL (CC13)
ALT: 22 U/L (ref 0–55)
AST: 25 U/L (ref 5–34)
Albumin: 3.7 g/dL (ref 3.5–5.0)
Calcium: 9.8 mg/dL (ref 8.4–10.4)
Chloride: 107 mEq/L (ref 98–109)
Creatinine: 0.7 mg/dL (ref 0.6–1.1)
Potassium: 3.7 mEq/L (ref 3.5–5.1)
Sodium: 142 mEq/L (ref 136–145)
Total Protein: 7.2 g/dL (ref 6.4–8.3)

## 2013-03-24 NOTE — Progress Notes (Signed)
ID: Debra Mcintyre   DOB: 04-12-1941  MR#: [id number]  WUJ#:[id number]  PCP: [name], MD GYN: [name], [name], [name],   HISTORY OF PRESENT ILLNESS: Debra Mcintyre had screening mammography at [facility] 04/10/2012 raising the question of some axillary lymph nodes on the right. Additional views of the right axilla and right axillary ultrasound performed 04/04/2012 showed 3 lymph nodes that appeared larger than prior. The largest measured 1.5 cm. 2 of them had cortical thickening. Close followup was suggested, and repeat right axillary ultrasound 06/25/2012 showed no significant change in the lymph nodes in question. Followup ultrasound in 3 months was recommended, but in the interim the patient saw her primary physician, Dr. [name] and ADL, and he set her up for a chest CT on 07/08/2012, which showed a mild asymmetric density in the upper mid right breast. There was a prominent right axillary lymph node measuring 1.9 cm, with a few smaller adjacent nodes asymmetric with compared to the left side. Again, short interval followup was recommended, and on 10/07/2012 the patient had digital right mammography and ultrasonography, now at the breast Center. Dr. [name] noted pleomorphic calcifications spanning an area of 6.3 cm without associated mass. Physical exam was unremarkable. Ultrasound showed an area of adenopathy in the right axilla measuring 1.6 cm, with a second lymph node with a thickened cortex measuring 1.2 cm. No suspicious mass was seen by ultrasound in the right breast.  Biopsy of the larger axillary lymph node on 10/07/2012 showed ([id number]) an invasive ductal carcinoma, triple negative, with an MIB-1 of 66%. Biopsy of the right breast the next day, [id number]) showed invasive ductal carcinoma, grade 3. Breast MRI 10/14/2012 showed a total area of irregular enhancement in the right breast measuring up to 12 cm. MRI guided biopsy of an  area in the upper inner quadrant of the right breast on 10/23/2012 showed ([id number]) ductal carcinoma in situ, with foci worrisome for invasion.  The patient's subsequent history is as detailed below  INTERVAL HISTORY: Debra Mcintyre returns today for followup of her locally advanced right breast cancer.  Weekly paclitaxel/carboplatin was discontinued after 7 weekly doses due to worsening neuropathy. The neuropathy is stable, and although it has not worsened, and has not really resolved. He still affects her fingers up to the second knuckle, and also makes her toes "feels funny".  REVIEW OF SYSTEMS: Otherwise,  Debra Mcintyre  feels that she has recovered well from chemotherapy. Her energy level is fair. She's had no fevers or chills. She denies any skin changes or abnormal bruising or bleeding. She's eating well, with an improvement in her appetite, and denies any nausea or change in bowel or bladder habits. She continues to have some shortness of breath with exertion which is stable. She's had no increased cough, pleurisy, or chest pain. No abnormal headaches or dizziness. She has some chronic lower back pain which is stable, but denies any additional myalgias, arthralgias, or bony pain. She's had no peripheral swelling.  A detailed review of systems is otherwise noncontributory.   PAST MEDICAL HISTORY: Past Medical History  Diagnosis Date  . Hypertension   . Cerebrovascular disease, unspecified   . Cerebral aneurysm, nonruptured   . Pure hypercholesterolemia   . Hypothyroidism   . Colonic polyp   . Breast cancer 09/2012    right breast/right axillary lymph node t2,pn1, stage 11b, invasive ductal carcinma, grade 3, triple negative, with an mib-1 of 15%  . History of  chemotherapy     PAST SURGICAL HISTORY: Past Surgical History  Procedure Laterality Date  . Left middle cerebral artery angioplasty  2004    by [name] had 2 follow up occurances  . Portacath placement N/A 11/05/2012     Procedure: INSERTION PORT-A-CATH;  Surgeon: [name], MD;  Location: [facility];  Service: General;  Laterality: N/A;  . Breast biopsy Right 10/23/2012  . Breast biopsy Right 10/08/2012    FAMILY HISTORY Family History  Problem Relation Age of Onset  . Lung cancer Mother 6  . Heart disease Father   . Colon cancer Brother 71  . Alcohol abuse Brother   . Colon cancer Maternal Uncle     dx in his 34s  . Cancer Maternal Aunt     unknown type  . Ovarian cancer Other 91   the patient's father died at the age of 48. The patient's mother died at the age of 16 from lung cancer. She was not a smoker. The cancer was diagnosed when she was 70 years old. The patient's mother had 2 sisters and one brother. One of those sisters had lung cancer and a brother had colon cancer, both diagnosed in their 23s. The patient herself has one brother who died from colon cancer at the age of 59. That brother has a daughter who was diagnosed with uterine cancer at the age of 23. All this raises the question of possible Lynch syndrome and I have referred the patient for genetics counseling.  GYNECOLOGIC HISTORY: Menarche age 37, menopause around 28. The patient is GX P0. She took birth control pills for many years with no complications.  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 Her stepchildren are Debra Mcintyre who lives in [place]" and is financial vice president of [employer], and Debra Mcintyre who lives in [place] and works as a Transport planner. The patient has 5 grandchildren. She is currently not a church attender   ADVANCED DIRECTIVES: Not in place; this was discussed 10/29/2012 and the patient was advised to complete a healthcare power of attorney document  HEALTH MAINTENANCE: History  Substance Use Topics  . Smoking status: Former Smoker -- 1.00 packs/day for 20 years    Types: Cigarettes     Quit date: 04/02/1986  . Smokeless tobacco: Never Used  . Alcohol Use: No     Colonoscopy: 2012  PAP: 2012  Bone density:  Lipid panel:  No Known Allergies  Current Outpatient Prescriptions  Medication Sig Dispense Refill  . aspirin 325 MG tablet Take 325 mg by mouth daily.        . Calcium Carbonate-Vitamin D (CALCIUM 600+D) 600-200 MG-UNIT TABS Take 1 tablet by mouth 2 (two) times daily.        . clopidogrel (PLAVIX) 75 MG tablet TAKE ONE TABLET BY MOUTH EVERY DAY  30 tablet  6  . famotidine (PEPCID) 40 MG tablet Take 1 tablet (40 mg total) by mouth daily.  30 tablet  3  . levothyroxine (SYNTHROID, LEVOTHROID) 125 MCG tablet Take 1 tablet (125 mcg total) by mouth daily.  30 tablet  11  . lidocaine-prilocaine (EMLA) cream Apply topically as needed. Apply over port 1-2 hors before chemo and cover with plastic wrap  30 g  0  . lisinopril (PRINIVIL,ZESTRIL) 20 MG tablet Take 1 tablet (20 mg total) by mouth daily.  30 tablet  6  . loratadine (CLARITIN)  10 MG tablet Take 10 mg by mouth daily.      Marland Kitchen LORazepam (ATIVAN) 0.5 MG tablet Take 1 tablet (0.5 mg total) by mouth at bedtime as needed for anxiety (Nausea or vomiting).  30 tablet  1  . metoprolol succinate (TOPROL-XL) 100 MG 24 hr tablet TAKE ONE TABLET BY MOUTH EVERY DAY  30 tablet  11  . metroNIDAZOLE (METROCREAM) 0.75 % cream As directed      . Multiple Vitamin (MULTIVITAMIN) capsule Take 1 capsule by mouth daily.        . Naproxen Sodium (ALEVE) 220 MG CAPS Take 1 capsule by mouth as needed.      . rosuvastatin (CRESTOR) 20 MG tablet Take 1 tablet (20 mg total) by mouth daily.  30 tablet  11  . venlafaxine XR (EFFEXOR-XR) 75 MG 24 hr capsule TAKE ONE CAPSULE BY MOUTH DAILY.  30 capsule  6   No current facility-administered medications for this visit.    OBJECTIVE: Middle-aged white woman who appears moderately fatigued Filed Vitals:   03/24/13 1041  BP: 100/66  Pulse: 109  Temp: 99.1 F (37.3 C)  Resp: 20     Body mass  index is 23.45 kg/(m^2).    ECOG FS: 1 Filed Weights   03/24/13 1041  Weight: 158 lb 14.4 oz (72.077 kg)   Sclerae unicteric Oropharynx clear, no ulcerations or thrush No cervical or supraclavicular adenopathy; Lungs clear to auscultation, good excursion bilaterally Heart regular rate and rhythm, no murmur appreciated Abdomen soft, nontender, positive bowel sounds MSK no focal spinal tenderness, no peripheral edema Neuro: nonfocal, well oriented, pleasant affect Breasts:  Deferred. Axillae are benign bilaterally with no palpable adenopathy noted. Port is intact in the left upper chest wall    LAB RESULTS: Lab Results  Component Value Date   WBC 4.6 03/24/2013   NEUTROABS 2.6 03/24/2013   HGB 11.7 03/24/2013   HCT 34.5* 03/24/2013   MCV 91.2 03/24/2013   PLT 282 03/24/2013      Chemistry      Component Value Date/Time   NA 142 03/24/2013 1020   NA 138 06/04/2012 1124   K 3.7 03/24/2013 1020   K 4.3 06/04/2012 1124   CL 102 01/28/2013 0909   CL 103 06/04/2012 1124   CO2 24 03/24/2013 1020   CO2 29 06/04/2012 1124   BUN 11.2 03/24/2013 1020   BUN 14 06/04/2012 1124   CREATININE 0.7 03/24/2013 1020   CREATININE 0.7 06/04/2012 1124      Component Value Date/Time   CALCIUM 9.8 03/24/2013 1020   CALCIUM 9.9 06/04/2012 1124   ALKPHOS 56 03/24/2013 1020   ALKPHOS 47 06/04/2012 1124   AST 25 03/24/2013 1020   AST 27 06/04/2012 1124   ALT 22 03/24/2013 1020   ALT 32 06/04/2012 1124   BILITOT 0.76 03/24/2013 1020   BILITOT 1.1 06/04/2012 1124       STUDIES: Mr Breast Bilateral W Wo Contrast  03/14/2013   *RADIOLOGY REPORT*  Clinical Data:Biopsy-proven multicentric right breast invasive ductal carcinoma with biopsy-proven right axillary lymphadenopathy metastatic disease.  BILATERAL BREAST MRI WITH AND WITHOUT CONTRAST  Technique: Multiplanar, multisequence MR images of both breasts were obtained prior to and following the intravenous administration of 15ml of Multihance.   THREE-DIMENSIONAL MR IMAGE RENDERING ON INDEPENDENT WORKSTATION: Three-dimensional MR images were rendered by post-processing  of the original MR data on an independent DynaCad workstation. The three-dimensional MR images were interpreted, and findings are reported in the following complete  MRI report for this study.  Comparison:  prior exams, including most recent breast MRI 12/30/2012  FINDINGS:  Breast composition:  d:  The breast tissue is extremely dense, which lowers the sensitivity of mammography.  Background parenchymal enhancement: Moderate to marked  Right breast:  There is a 4 cm area of residual irregular segmental enhancement of the right upper inner quadrant mid to posterior depth, significantly decreased in size since the previous exam. Overall, the degree of right breast enhancement is asymmetrically decreased as compared to the left.  No new enhancing abnormality is identified.  Left breast:  No mass or abnormal enhancement.  Lymph nodes:  Clip artifact is again noted within the right upper inner quadrant anteriorly and posteriorly.  Right axillary lymph nodes now appear normal in appearance.  No new internal mammary artery chain or axillary lymphadenopathy on either side.  Ancillary findings:  None.  Left-sided Port-A-Cath in place.  IMPRESSION: Minimal residual 4 cm irregular segmental enhancement right breast upper inner quadrant indicating treatment response.  No new area of abnormal enhancement in either breast.  RECOMMENDATION: Treatment plan   Original Report Authenticated By: Christiana Pellant, M.D.     ASSESSMENT: 72 y.o. Arial woman   (1)  status post right breast and right axillary lymph node biopsies February 2014 of a clinical T2, pN1, stage IIB,  invasive ductal carcinoma, grade 3, triple negative, with an MIB-1 of 15%  (2)  a third biopsy from a different quadrant 10/23/2012 showed ductal carcinoma in situ  (3)  currently being treated in the neoadjuvant setting, completed  4 dose dense cycles of doxorubicin/ cyclophosphamide 12/23/2012, followed by 7 weekly doses of paclitaxel and carboplatin completed 03/03/2013.  (4) family history suggestive of Lynch syndrome; genetics evaluation pending   PLAN:  Debra Mcintyre has completed her neoadjuvant chemotherapy, and her recent breast MRI continues to show improvement in her known cancer. She scheduled to see Dr. Dwain Sarna next week to discuss definitive surgery. She's going to [place] in early September, and hopes to have her surgery upon her return, somewhere around September 3 or fourth.  We will plan on seeing Debra Mcintyre again in late September to review her pathology report, discuss her prognosis, and establish a followup plan. All this was reviewed with Debra Mcintyre today and she voices understanding and agreement with this plan. She knows to call with any changes or problems.   If there is significant amount of residual disease pathologically, we can consider 6 doses of weekly carboplatin/gemcitabine postop. That would not be standard.  We are hopeful however that she will have had a good enough treatment to her neoadjuvant therapy that we can just start observation after she completes her radiation treatments.  Jatoria Kneeland PA-C   03/24/2013

## 2013-03-24 NOTE — Telephone Encounter (Signed)
appts made and printed...td 

## 2013-03-25 ENCOUNTER — Telehealth (INDEPENDENT_AMBULATORY_CARE_PROVIDER_SITE_OTHER): Payer: Self-pay

## 2013-03-25 NOTE — Telephone Encounter (Signed)
LMOM for pt to call me b/c Dr Dwain Sarna is requesting that we switch her appt time on 8/18 from the am to the pm at 4:30 check in at 4:15 on 8/18.

## 2013-03-26 ENCOUNTER — Telehealth (INDEPENDENT_AMBULATORY_CARE_PROVIDER_SITE_OTHER): Payer: Self-pay | Admitting: General Surgery

## 2013-03-26 NOTE — Telephone Encounter (Signed)
Pt returned call from [facility] and updated on appt time change for next week.  Pt is able to come at that time and will attend appt at 4:30.

## 2013-03-28 DIAGNOSIS — L97509 Non-pressure chronic ulcer of other part of unspecified foot with unspecified severity: Secondary | ICD-10-CM | POA: Diagnosis not present

## 2013-03-28 DIAGNOSIS — L608 Other nail disorders: Secondary | ICD-10-CM | POA: Diagnosis not present

## 2013-03-31 ENCOUNTER — Ambulatory Visit (INDEPENDENT_AMBULATORY_CARE_PROVIDER_SITE_OTHER): Payer: Medicare Other | Admitting: General Surgery

## 2013-03-31 ENCOUNTER — Encounter (INDEPENDENT_AMBULATORY_CARE_PROVIDER_SITE_OTHER): Payer: Medicare Other | Admitting: General Surgery

## 2013-03-31 ENCOUNTER — Encounter (INDEPENDENT_AMBULATORY_CARE_PROVIDER_SITE_OTHER): Payer: Self-pay | Admitting: General Surgery

## 2013-03-31 VITALS — BP 130/88 | HR 88 | Temp 98.4°F | Resp 15 | Ht 68.0 in | Wt 157.8 lb

## 2013-03-31 DIAGNOSIS — C50111 Malignant neoplasm of central portion of right female breast: Secondary | ICD-10-CM

## 2013-03-31 DIAGNOSIS — C50119 Malignant neoplasm of central portion of unspecified female breast: Secondary | ICD-10-CM | POA: Diagnosis not present

## 2013-03-31 NOTE — Progress Notes (Signed)
Patient ID: Debra Mcintyre, female   DOB: [birth year], 72 y.o.   MRN: [id number]  Chief Complaint  Patient presents with  . Routine Post Op    reck br/schedue sx    HPI Debra Mcintyre is a 72 y.o. female.   HPI 88 yof initially seen in multidisciplinary clinic with multicentric triple negative tumor.  Her nodes were positive upon presentation.  She decided to undergo primary chemotherapy which she has done pretty well with except for peripheral neuropathy.  She has undergone mr at end of treatment that shows a minimal residual 4 cm irregular area of enhancement in the right breast upper inner quadrant indicating treatment response.  Nodes all appear normal.  She comes in today to discuss surgical therapy.  Past Medical History  Diagnosis Date  . Hypertension   . Cerebrovascular disease, unspecified   . Cerebral aneurysm, nonruptured   . Pure hypercholesterolemia   . Hypothyroidism   . Colonic polyp   . Breast cancer 09/2012    right breast/right axillary lymph node t2,pn1, stage 11b, invasive ductal carcinma, grade 3, triple negative, with an mib-1 of 15%  . History of chemotherapy     Past Surgical History  Procedure Laterality Date  . Left middle cerebral artery angioplasty  2004    by [name] had 2 follow up occurances  . Portacath placement N/A 11/05/2012    Procedure: INSERTION PORT-A-CATH;  Surgeon: [name], MD;  Location: [facility];  Service: General;  Laterality: N/A;  . Breast biopsy Right 10/23/2012  . Breast biopsy Right 10/08/2012    Family History  Problem Relation Age of Onset  . Lung cancer Mother 47  . Heart disease Father   . Colon cancer Brother 43  . Alcohol abuse Brother   . Colon cancer Maternal Uncle     dx in his 65s  . Cancer Maternal Aunt     unknown type  . Ovarian cancer Other 17    Social History History  Substance Use Topics  . Smoking status: Former Smoker -- 1.00 packs/day for 20 years    Types:  Cigarettes    Quit date: 04/02/1986  . Smokeless tobacco: Never Used  . Alcohol Use: No    No Known Allergies  Current Outpatient Prescriptions  Medication Sig Dispense Refill  . aspirin 325 MG tablet Take 325 mg by mouth daily.        . Calcium Carbonate-Vitamin D (CALCIUM 600+D) 600-200 MG-UNIT TABS Take 1 tablet by mouth 2 (two) times daily.        . clopidogrel (PLAVIX) 75 MG tablet TAKE ONE TABLET BY MOUTH EVERY DAY  30 tablet  6  . famotidine (PEPCID) 40 MG tablet Take 1 tablet (40 mg total) by mouth daily.  30 tablet  3  . HYDROcodone-acetaminophen (NORCO/VICODIN) 5-325 MG per tablet       . levothyroxine (SYNTHROID, LEVOTHROID) 125 MCG tablet Take 1 tablet (125 mcg total) by mouth daily.  30 tablet  11  . lidocaine-prilocaine (EMLA) cream Apply topically as needed. Apply over port 1-2 hors before chemo and cover with plastic wrap  30 g  0  . lisinopril (PRINIVIL,ZESTRIL) 20 MG tablet Take 1 tablet (20 mg total) by mouth daily.  30 tablet  6  . loratadine (CLARITIN) 10 MG tablet Take 10 mg by mouth daily.      Marland Kitchen LORazepam (ATIVAN) 0.5 MG tablet Take 1 tablet (0.5 mg total) by mouth at bedtime as needed for anxiety (  Nausea or vomiting).  30 tablet  1  . metoprolol succinate (TOPROL-XL) 100 MG 24 hr tablet TAKE ONE TABLET BY MOUTH EVERY DAY  30 tablet  11  . metroNIDAZOLE (METROCREAM) 0.75 % cream As directed      . Multiple Vitamin (MULTIVITAMIN) capsule Take 1 capsule by mouth daily.        . Naproxen Sodium (ALEVE) 220 MG CAPS Take 1 capsule by mouth as needed.      . rosuvastatin (CRESTOR) 20 MG tablet Take 1 tablet (20 mg total) by mouth daily.  30 tablet  11  . venlafaxine XR (EFFEXOR-XR) 75 MG 24 hr capsule TAKE ONE CAPSULE BY MOUTH DAILY.  30 capsule  6   No current facility-administered medications for this visit.    Review of Systems Review of Systems  Constitutional: Negative for fever, chills and unexpected weight change.  HENT: Negative for hearing loss, congestion,  sore throat, trouble swallowing and voice change.   Eyes: Negative for visual disturbance.  Respiratory: Negative for cough and wheezing.   Cardiovascular: Negative for chest pain, palpitations and leg swelling.  Gastrointestinal: Negative for nausea, vomiting, abdominal pain, diarrhea, constipation, blood in stool, abdominal distention and anal bleeding.  Genitourinary: Negative for hematuria, vaginal bleeding and difficulty urinating.  Musculoskeletal: Negative for arthralgias.  Skin: Negative for rash and wound.  Neurological: Negative for seizures, syncope and headaches.  Hematological: Negative for adenopathy. Does not bruise/bleed easily.  Psychiatric/Behavioral: Negative for confusion.    Blood pressure 130/88, pulse 88, temperature 98.4 F (36.9 C), temperature source Temporal, resp. rate 15, height 5\' 8"  (1.727 m), weight 157 lb 12.8 oz (71.578 kg).  Physical Exam Physical Exam  Constitutional: She appears well-developed and well-nourished.  Eyes: No scleral icterus.  Neck: Neck supple.  Cardiovascular: Normal rate, regular rhythm and normal heart sounds.   Pulmonary/Chest: Effort normal and breath sounds normal. She has no wheezes. She has no rales. Right breast exhibits no inverted nipple, no mass, no nipple discharge, no skin change and no tenderness. Left breast exhibits no inverted nipple, no mass, no nipple discharge, no skin change and no tenderness.  Abdominal: Soft. There is no tenderness.  Lymphadenopathy:    She has no cervical adenopathy.    She has no axillary adenopathy.       Right: No supraclavicular adenopathy present.       Left: No supraclavicular adenopathy present.    Data Reviewed MRI  Assessment    Right multicentric breast cancer, node positive upon presentation s/p primary chemotherapy    Plan    Right MRM  We discussed options and agree again due to initial multicentric tumor and node positive disease that the standard treatment would be  right mrm.  I do think node dissection is appropriate as there is no data to support sentinel node alone right now as there is an ongoing trial to study this.  She is comfortable with this plan.  We discussed possible reconstruction at the same time but she would like to wait as she will likely get radiation.  She asked if she will need radiation even if nodes negative and I told her this is not entirely clear right now either but would be discussed with her after surgery.  We discussed surgery, hospital stay and recovery. Risks including but not limited to bleeding, infection, flap nonviability, wound dehiscence,dvt, pe, stroke/tia were all discussed.  We also discussed risks of alnd including shoulder pain and lymphedema. Will plan on doing surgery once  they return from their trip.       Rossi Burdo 03/31/2013, 10:29 PM

## 2013-04-16 ENCOUNTER — Encounter (HOSPITAL_COMMUNITY): Payer: Self-pay | Admitting: Pharmacy Technician

## 2013-04-17 ENCOUNTER — Ambulatory Visit: Payer: Medicare Other

## 2013-04-17 ENCOUNTER — Encounter (HOSPITAL_COMMUNITY): Payer: Self-pay

## 2013-04-17 ENCOUNTER — Ambulatory Visit: Payer: Medicare Other | Admitting: Physician Assistant

## 2013-04-17 ENCOUNTER — Encounter (HOSPITAL_COMMUNITY)
Admission: RE | Admit: 2013-04-17 | Discharge: 2013-04-17 | Disposition: A | Discharge status: Home / Self Care | Payer: Medicare Other | Source: Ambulatory Visit | Attending: General Surgery | Admitting: General Surgery

## 2013-04-17 ENCOUNTER — Other Ambulatory Visit: Payer: Medicare Other | Admitting: Lab

## 2013-04-17 DIAGNOSIS — Z01818 Encounter for other preprocedural examination: Secondary | ICD-10-CM | POA: Insufficient documentation

## 2013-04-17 DIAGNOSIS — Z01812 Encounter for preprocedural laboratory examination: Secondary | ICD-10-CM | POA: Insufficient documentation

## 2013-04-17 HISTORY — DX: Polyneuropathy, unspecified: G62.9

## 2013-04-17 LAB — CBC WITH DIFFERENTIAL/PLATELET
Basophils Absolute: 0 10*3/uL (ref 0.0–0.1)
Eosinophils Absolute: 0.2 10*3/uL (ref 0.0–0.7)
Eosinophils Relative: 3 % (ref 0–5)
Lymphocytes Relative: 23 % (ref 12–46)
MCH: 29.7 pg (ref 26.0–34.0)
MCV: 87.6 fL (ref 78.0–100.0)
Platelets: 236 10*3/uL (ref 150–400)
RDW: 14.6 % (ref 11.5–15.5)
WBC: 5.9 10*3/uL (ref 4.0–10.5)

## 2013-04-17 LAB — BASIC METABOLIC PANEL
CO2: 26 mEq/L (ref 19–32)
Calcium: 9.9 mg/dL (ref 8.4–10.5)
Creatinine, Ser: 0.58 mg/dL (ref 0.50–1.10)

## 2013-04-17 NOTE — Pre-Procedure Instructions (Signed)
EDMONIA GONSER  04/17/2013   Your procedure is scheduled on:  September 10  Report to [facility] at 11:30 AM.  Call this number if you have problems the morning of surgery: [phone]   Remember:   Do not eat food or drink liquids after midnight.   Take these medicines the morning of surgery with A SIP OF WATER: Pepcid, Levothyroxine, Lorazepam (if needed), Metoprolol, Oxycodone (if needed), Effexor   STOP Multiple Vitamin and Calcium today  Do not take Aspirin, Aleve, Naproxen, Advil, Ibuprofen, Vitamin, Herbs, or Supplements starting today  Do not wear jewelry, make-up or nail polish.  Do not wear lotions, powders, or perfumes. You may wear deodorant.  Do not shave 48 hours prior to surgery. Men may shave face and neck.  Do not bring valuables to the hospital.  [facility] is not responsible                   for any belongings or valuables.  Contacts, dentures or bridgework may not be worn into surgery.  Leave suitcase in the car. After surgery it may be brought to your room.  For patients admitted to the hospital, checkout time is 11:00 AM the day of  discharge.   Special Instructions: Shower using CHG 2 nights before surgery and the night before surgery.  If you shower the day of surgery use CHG.  Use special wash - you have one bottle of CHG for all showers.  You should use approximately 1/3 of the bottle for each shower.   Please read over the following fact sheets that you were given: Pain Booklet, Coughing and Deep Breathing and Surgical Site Infection Prevention

## 2013-04-21 ENCOUNTER — Other Ambulatory Visit (INDEPENDENT_AMBULATORY_CARE_PROVIDER_SITE_OTHER): Payer: Self-pay | Admitting: General Surgery

## 2013-04-21 ENCOUNTER — Telehealth (INDEPENDENT_AMBULATORY_CARE_PROVIDER_SITE_OTHER): Payer: Self-pay

## 2013-04-21 NOTE — Telephone Encounter (Signed)
LMOM on cell b/c not able to complete home number. I lmom that pt will be receiving a call from [facility] about this clinical [facility] at [facility]. Dr Dwain Sarna will call the pt tomorrow.

## 2013-04-22 ENCOUNTER — Telehealth (INDEPENDENT_AMBULATORY_CARE_PROVIDER_SITE_OTHER): Payer: Self-pay

## 2013-04-22 ENCOUNTER — Encounter: Payer: Self-pay | Admitting: *Deleted

## 2013-04-22 ENCOUNTER — Other Ambulatory Visit (INDEPENDENT_AMBULATORY_CARE_PROVIDER_SITE_OTHER): Payer: Self-pay | Admitting: General Surgery

## 2013-04-22 DIAGNOSIS — C50911 Malignant neoplasm of unspecified site of right female breast: Secondary | ICD-10-CM

## 2013-04-22 MED ORDER — CEFAZOLIN SODIUM-DEXTROSE 2-3 GM-% IV SOLR
2.0000 g | INTRAVENOUS | Status: AC
  Administered 2013-04-23: 2 g via INTRAVENOUS
  Filled 2013-04-22: qty 50

## 2013-04-22 NOTE — Telephone Encounter (Signed)
Called pt to let her know that we do have her scheduled for the mgm on right side and right br u/s at [facility] at 9/10 arrive at 8:45. The pt understands.

## 2013-04-22 NOTE — Progress Notes (Signed)
Patient in this morning to sign consent for [id number].   Still needs ipsilateral breast ultrasound and mammogram prior to registration.  Informed Dr. Dwain Sarna of requirement. He will arrange for tomorrow morning.  Performance status 0. Debra Mcintyre to pre-register patient on 04/24/2011.

## 2013-04-23 ENCOUNTER — Encounter: Payer: Self-pay | Admitting: *Deleted

## 2013-04-23 ENCOUNTER — Encounter (HOSPITAL_COMMUNITY)
Admission: RE | Disposition: A | Discharge status: Home / Self Care | Payer: Self-pay | Source: Ambulatory Visit | Attending: General Surgery

## 2013-04-23 ENCOUNTER — Encounter (HOSPITAL_COMMUNITY): Payer: Self-pay | Admitting: Certified Registered"

## 2013-04-23 ENCOUNTER — Ambulatory Visit
Admission: RE | Admit: 2013-04-23 | Discharge: 2013-04-23 | Disposition: A | Discharge status: Home / Self Care | Payer: Medicare Other | Source: Ambulatory Visit | Attending: General Surgery | Admitting: General Surgery

## 2013-04-23 ENCOUNTER — Encounter (HOSPITAL_COMMUNITY)
Admission: RE | Admit: 2013-04-23 | Discharge: 2013-04-23 | Disposition: A | Discharge status: Home / Self Care | Payer: Medicare Other | Source: Ambulatory Visit | Attending: General Surgery | Admitting: General Surgery

## 2013-04-23 ENCOUNTER — Inpatient Hospital Stay (HOSPITAL_COMMUNITY)
Admission: RE | Admit: 2013-04-23 | Discharge: 2013-04-24 | DRG: 580 | Disposition: A | Discharge status: Home / Self Care | Payer: Medicare Other | Source: Ambulatory Visit | Attending: General Surgery | Admitting: General Surgery

## 2013-04-23 ENCOUNTER — Ambulatory Visit (HOSPITAL_COMMUNITY): Payer: Medicare Other | Admitting: Certified Registered"

## 2013-04-23 DIAGNOSIS — C50911 Malignant neoplasm of unspecified site of right female breast: Secondary | ICD-10-CM

## 2013-04-23 DIAGNOSIS — E7800 Pure hypercholesterolemia, unspecified: Secondary | ICD-10-CM | POA: Diagnosis present

## 2013-04-23 DIAGNOSIS — G5790 Unspecified mononeuropathy of unspecified lower limb: Secondary | ICD-10-CM | POA: Diagnosis present

## 2013-04-23 DIAGNOSIS — T451X5A Adverse effect of antineoplastic and immunosuppressive drugs, initial encounter: Secondary | ICD-10-CM | POA: Diagnosis present

## 2013-04-23 DIAGNOSIS — I10 Essential (primary) hypertension: Secondary | ICD-10-CM | POA: Diagnosis not present

## 2013-04-23 DIAGNOSIS — Z87891 Personal history of nicotine dependence: Secondary | ICD-10-CM

## 2013-04-23 DIAGNOSIS — Z7902 Long term (current) use of antithrombotics/antiplatelets: Secondary | ICD-10-CM | POA: Diagnosis not present

## 2013-04-23 DIAGNOSIS — E039 Hypothyroidism, unspecified: Secondary | ICD-10-CM | POA: Diagnosis present

## 2013-04-23 DIAGNOSIS — Z7982 Long term (current) use of aspirin: Secondary | ICD-10-CM | POA: Diagnosis not present

## 2013-04-23 DIAGNOSIS — C773 Secondary and unspecified malignant neoplasm of axilla and upper limb lymph nodes: Secondary | ICD-10-CM | POA: Diagnosis not present

## 2013-04-23 DIAGNOSIS — Z79899 Other long term (current) drug therapy: Secondary | ICD-10-CM

## 2013-04-23 DIAGNOSIS — C50919 Malignant neoplasm of unspecified site of unspecified female breast: Secondary | ICD-10-CM | POA: Diagnosis not present

## 2013-04-23 DIAGNOSIS — C50219 Malignant neoplasm of upper-inner quadrant of unspecified female breast: Secondary | ICD-10-CM | POA: Diagnosis not present

## 2013-04-23 DIAGNOSIS — Z9221 Personal history of antineoplastic chemotherapy: Secondary | ICD-10-CM

## 2013-04-23 DIAGNOSIS — N63 Unspecified lump in unspecified breast: Secondary | ICD-10-CM | POA: Diagnosis not present

## 2013-04-23 DIAGNOSIS — K219 Gastro-esophageal reflux disease without esophagitis: Secondary | ICD-10-CM | POA: Diagnosis present

## 2013-04-23 DIAGNOSIS — R928 Other abnormal and inconclusive findings on diagnostic imaging of breast: Secondary | ICD-10-CM | POA: Diagnosis not present

## 2013-04-23 HISTORY — PX: MASTECTOMY W/ SENTINEL NODE BIOPSY: SHX2001

## 2013-04-23 SURGERY — MASTECTOMY WITH SENTINEL LYMPH NODE BIOPSY
Anesthesia: General | Site: Breast | Laterality: Right | Wound class: Clean

## 2013-04-23 MED ORDER — FENTANYL CITRATE 0.05 MG/ML IJ SOLN
INTRAMUSCULAR | Status: DC | PRN
  Administered 2013-04-23: 50 ug via INTRAVENOUS
  Administered 2013-04-23: 100 ug via INTRAVENOUS
  Administered 2013-04-23 (×2): 50 ug via INTRAVENOUS

## 2013-04-23 MED ORDER — METHYLENE BLUE 1 % INJ SOLN
INTRAMUSCULAR | Status: AC
  Filled 2013-04-23: qty 10

## 2013-04-23 MED ORDER — PROPOFOL 10 MG/ML IV BOLUS
INTRAVENOUS | Status: DC | PRN
  Administered 2013-04-23: 140 mg via INTRAVENOUS

## 2013-04-23 MED ORDER — ONDANSETRON HCL 4 MG/2ML IJ SOLN
INTRAMUSCULAR | Status: DC | PRN
  Administered 2013-04-23: 4 mg via INTRAVENOUS

## 2013-04-23 MED ORDER — OXYCODONE HCL 5 MG PO TABS
ORAL_TABLET | ORAL | Status: AC
  Filled 2013-04-23: qty 1

## 2013-04-23 MED ORDER — SODIUM CHLORIDE 0.9 % IV SOLN
INTRAVENOUS | Status: DC
  Administered 2013-04-23: 19:00:00 via INTRAVENOUS

## 2013-04-23 MED ORDER — ONDANSETRON HCL 4 MG/2ML IJ SOLN: 4.0000 mg | Freq: Four times a day (QID) | INTRAMUSCULAR | Status: DC | PRN

## 2013-04-23 MED ORDER — MORPHINE SULFATE 2 MG/ML IJ SOLN
2.0000 mg | INTRAMUSCULAR | Status: DC | PRN
  Administered 2013-04-23 – 2013-04-24 (×7): 2 mg via INTRAVENOUS
  Filled 2013-04-23 (×6): qty 1

## 2013-04-23 MED ORDER — EPHEDRINE SULFATE 50 MG/ML IJ SOLN
INTRAMUSCULAR | Status: DC | PRN
  Administered 2013-04-23 (×5): 10 mg via INTRAVENOUS

## 2013-04-23 MED ORDER — HYDROMORPHONE HCL PF 1 MG/ML IJ SOLN
INTRAMUSCULAR | Status: AC
  Filled 2013-04-23: qty 1

## 2013-04-23 MED ORDER — METOPROLOL SUCCINATE ER 100 MG PO TB24
100.0000 mg | ORAL_TABLET | Freq: Every day | ORAL | Status: DC
  Administered 2013-04-23: 100 mg via ORAL
  Filled 2013-04-23: qty 1

## 2013-04-23 MED ORDER — OXYCODONE HCL 5 MG PO TABS
5.0000 mg | ORAL_TABLET | ORAL | Status: DC | PRN
  Administered 2013-04-24: 5 mg via ORAL
  Filled 2013-04-23: qty 1

## 2013-04-23 MED ORDER — PHENYLEPHRINE HCL 10 MG/ML IJ SOLN
INTRAMUSCULAR | Status: DC | PRN
  Administered 2013-04-23: 40 ug via INTRAVENOUS
  Administered 2013-04-23: 80 ug via INTRAVENOUS
  Administered 2013-04-23: 40 ug via INTRAVENOUS
  Administered 2013-04-23 (×3): 80 ug via INTRAVENOUS

## 2013-04-23 MED ORDER — LACTATED RINGERS IV SOLN
INTRAVENOUS | Status: DC
  Administered 2013-04-23 (×2): via INTRAVENOUS

## 2013-04-23 MED ORDER — METOCLOPRAMIDE HCL 5 MG/ML IJ SOLN
INTRAMUSCULAR | Status: AC
  Filled 2013-04-23: qty 2

## 2013-04-23 MED ORDER — MORPHINE SULFATE 2 MG/ML IJ SOLN
INTRAMUSCULAR | Status: AC
  Filled 2013-04-23: qty 1

## 2013-04-23 MED ORDER — SODIUM CHLORIDE 0.9 % IJ SOLN
INTRAMUSCULAR | Status: DC | PRN
  Administered 2013-04-23: 14:00:00

## 2013-04-23 MED ORDER — TECHNETIUM TC 99M SULFUR COLLOID FILTERED
1.0000 | Freq: Once | INTRAVENOUS | Status: AC | PRN
  Administered 2013-04-23: 1 via INTRADERMAL

## 2013-04-23 MED ORDER — METOCLOPRAMIDE HCL 5 MG/ML IJ SOLN
10.0000 mg | Freq: Once | INTRAMUSCULAR | Status: AC | PRN
  Administered 2013-04-23: 10 mg via INTRAVENOUS

## 2013-04-23 MED ORDER — LISINOPRIL 20 MG PO TABS
20.0000 mg | ORAL_TABLET | Freq: Every day | ORAL | Status: DC
  Administered 2013-04-23: 20 mg via ORAL
  Filled 2013-04-23 (×2): qty 1

## 2013-04-23 MED ORDER — LEVOTHYROXINE SODIUM 125 MCG PO TABS
125.0000 ug | ORAL_TABLET | Freq: Every day | ORAL | Status: DC
  Administered 2013-04-24: 125 ug via ORAL
  Filled 2013-04-23 (×2): qty 1

## 2013-04-23 MED ORDER — 0.9 % SODIUM CHLORIDE (POUR BTL) OPTIME
TOPICAL | Status: DC | PRN
  Administered 2013-04-23: 1000 mL

## 2013-04-23 MED ORDER — METOPROLOL SUCCINATE ER 100 MG PO TB24
100.0000 mg | ORAL_TABLET | Freq: Every day | ORAL | Status: DC
  Filled 2013-04-23 (×2): qty 1

## 2013-04-23 MED ORDER — ACETAMINOPHEN 325 MG PO TABS
650.0000 mg | ORAL_TABLET | Freq: Four times a day (QID) | ORAL | Status: DC | PRN
  Administered 2013-04-24: 650 mg via ORAL
  Filled 2013-04-23: qty 2

## 2013-04-23 MED ORDER — LORAZEPAM 0.5 MG PO TABS
0.5000 mg | ORAL_TABLET | Freq: Every evening | ORAL | Status: DC | PRN
  Administered 2013-04-23: 0.5 mg via ORAL
  Filled 2013-04-23: qty 1

## 2013-04-23 MED ORDER — OXYCODONE HCL 5 MG/5ML PO SOLN: 5.0000 mg | Freq: Once | ORAL | Status: DC | PRN

## 2013-04-23 MED ORDER — PANTOPRAZOLE SODIUM 40 MG IV SOLR
40.0000 mg | Freq: Every day | INTRAVENOUS | Status: DC
  Administered 2013-04-23: 40 mg via INTRAVENOUS
  Filled 2013-04-23 (×2): qty 40

## 2013-04-23 MED ORDER — ACETAMINOPHEN 650 MG RE SUPP: 650.0000 mg | Freq: Four times a day (QID) | RECTAL | Status: DC | PRN

## 2013-04-23 MED ORDER — MIDAZOLAM HCL 5 MG/5ML IJ SOLN
INTRAMUSCULAR | Status: DC | PRN
  Administered 2013-04-23: 2 mg via INTRAVENOUS

## 2013-04-23 MED ORDER — OXYCODONE HCL 5 MG PO TABS
5.0000 mg | ORAL_TABLET | Freq: Once | ORAL | Status: DC | PRN
  Administered 2013-04-23: 5 mg via ORAL

## 2013-04-23 MED ORDER — LIDOCAINE HCL (CARDIAC) 20 MG/ML IV SOLN
INTRAVENOUS | Status: DC | PRN
  Administered 2013-04-23: 50 mg via INTRAVENOUS

## 2013-04-23 MED ORDER — HYDROMORPHONE HCL PF 1 MG/ML IJ SOLN
0.2500 mg | INTRAMUSCULAR | Status: DC | PRN
  Administered 2013-04-23 (×4): 0.5 mg via INTRAVENOUS

## 2013-04-23 SURGICAL SUPPLY — 58 items
ADH SKN CLS APL DERMABOND .7 (GAUZE/BANDAGES/DRESSINGS) ×3
APL SKNCLS STERI-STRIP NONHPOA (GAUZE/BANDAGES/DRESSINGS) ×1
APPLIER CLIP 9.375 MED OPEN (MISCELLANEOUS)
APR CLP MED 9.3 20 MLT OPN (MISCELLANEOUS)
BENZOIN TINCTURE PRP APPL 2/3 (GAUZE/BANDAGES/DRESSINGS) ×2 IMPLANT
BINDER BREAST LRG (GAUZE/BANDAGES/DRESSINGS) IMPLANT
BINDER BREAST XLRG (GAUZE/BANDAGES/DRESSINGS) ×1 IMPLANT
CANISTER SUCTION 2500CC (MISCELLANEOUS) ×2 IMPLANT
CHLORAPREP W/TINT 26ML (MISCELLANEOUS) ×2 IMPLANT
CLIP APPLIE 9.375 MED OPEN (MISCELLANEOUS) IMPLANT
CLOTH BEACON ORANGE TIMEOUT ST (SAFETY) ×2 IMPLANT
CONT SPEC 4OZ CLIKSEAL STRL BL (MISCELLANEOUS) ×6 IMPLANT
COVER PROBE W GEL 5X96 (DRAPES) ×2 IMPLANT
COVER SURGICAL LIGHT HANDLE (MISCELLANEOUS) ×2 IMPLANT
DERMABOND ADVANCED (GAUZE/BANDAGES/DRESSINGS) ×3
DERMABOND ADVANCED .7 DNX12 (GAUZE/BANDAGES/DRESSINGS) ×3 IMPLANT
DRAIN CHANNEL 19F RND (DRAIN) ×2 IMPLANT
DRAPE LAPAROSCOPIC ABDOMINAL (DRAPES) ×2 IMPLANT
ELECT BLADE 4.0 EZ CLEAN MEGAD (MISCELLANEOUS) ×2
ELECT CAUTERY BLADE 6.4 (BLADE) ×2 IMPLANT
ELECT REM PT RETURN 9FT ADLT (ELECTROSURGICAL) ×2
ELECTRODE BLDE 4.0 EZ CLN MEGD (MISCELLANEOUS) ×1 IMPLANT
ELECTRODE REM PT RTRN 9FT ADLT (ELECTROSURGICAL) ×1 IMPLANT
EVACUATOR SILICONE 100CC (DRAIN) ×2 IMPLANT
GLOVE BIO SURGEON STRL SZ7 (GLOVE) ×2 IMPLANT
GLOVE BIO SURGEON STRL SZ7.5 (GLOVE) ×1 IMPLANT
GLOVE BIOGEL PI IND STRL 7.5 (GLOVE) ×1 IMPLANT
GLOVE BIOGEL PI INDICATOR 7.5 (GLOVE) ×2
GOWN STRL NON-REIN LRG LVL3 (GOWN DISPOSABLE) ×6 IMPLANT
KIT BASIN OR (CUSTOM PROCEDURE TRAY) ×2 IMPLANT
KIT ROOM TURNOVER OR (KITS) ×2 IMPLANT
MARKER SKIN DUAL TIP RULER LAB (MISCELLANEOUS) ×2 IMPLANT
NDL 18GX1X1/2 (RX/OR ONLY) (NEEDLE) IMPLANT
NDL HYPO 25GX1X1/2 BEV (NEEDLE) IMPLANT
NEEDLE 18GX1X1/2 (RX/OR ONLY) (NEEDLE) IMPLANT
NEEDLE HYPO 25GX1X1/2 BEV (NEEDLE) IMPLANT
NS IRRIG 1000ML POUR BTL (IV SOLUTION) ×2 IMPLANT
PACK GENERAL/GYN (CUSTOM PROCEDURE TRAY) ×2 IMPLANT
PAD ARMBOARD 7.5X6 YLW CONV (MISCELLANEOUS) ×2 IMPLANT
PIN SAFETY STERILE (MISCELLANEOUS) ×2 IMPLANT
SPECIMEN JAR LARGE (MISCELLANEOUS) ×1 IMPLANT
SPECIMEN JAR MEDIUM (MISCELLANEOUS) ×2 IMPLANT
SPECIMEN JAR X LARGE (MISCELLANEOUS) IMPLANT
SPONGE GAUZE 4X4 12PLY (GAUZE/BANDAGES/DRESSINGS) ×2 IMPLANT
STAPLER VISISTAT 35W (STAPLE) ×2 IMPLANT
STRIP CLOSURE SKIN 1/2X4 (GAUZE/BANDAGES/DRESSINGS) ×4 IMPLANT
SUT ETHILON 2 0 FS 18 (SUTURE) ×2 IMPLANT
SUT MNCRL AB 4-0 PS2 18 (SUTURE) ×1 IMPLANT
SUT MON AB 4-0 PC3 18 (SUTURE) ×2 IMPLANT
SUT SILK 2 0 SH (SUTURE) IMPLANT
SUT VIC AB 3-0 54X BRD REEL (SUTURE) ×1 IMPLANT
SUT VIC AB 3-0 BRD 54 (SUTURE) ×2
SUT VIC AB 3-0 SH 18 (SUTURE) ×2 IMPLANT
SUT VIC AB 3-0 SH 8-18 (SUTURE) ×2 IMPLANT
SYR CONTROL 10ML LL (SYRINGE) IMPLANT
TOWEL OR 17X24 6PK STRL BLUE (TOWEL DISPOSABLE) ×2 IMPLANT
TOWEL OR 17X26 10 PK STRL BLUE (TOWEL DISPOSABLE) ×2 IMPLANT
WATER STERILE IRR 1000ML POUR (IV SOLUTION) IMPLANT

## 2013-04-23 NOTE — Transfer of Care (Signed)
Immediate Anesthesia Transfer of Care Note  Patient: Debra Mcintyre  Procedure(s) Performed: Procedure(s): RIGHT TOTAL MASTECTOMY WITH SENTINEL LYMPH NODE BIOPSY (Right)  Patient Location: PACU  Anesthesia Type:General  Level of Consciousness: awake, alert , oriented and patient cooperative  Airway & Oxygen Therapy: Patient Spontanous Breathing and Patient connected to nasal cannula oxygen  Post-op Assessment: Report given to PACU RN, Post -op Vital signs reviewed and stable and Patient moving all extremities  Post vital signs: Reviewed and stable  Complications: No apparent anesthesia complications

## 2013-04-23 NOTE — Preoperative (Signed)
Beta Blockers   Reason not to administer Beta Blockers:Not Applicable 

## 2013-04-23 NOTE — Op Note (Signed)
Preoperative diagnosis: Multicentric left breast cancer, node positive s/p primary chemotherapy Postoperative diagnosis: same as above Procedure:  1. Right total mastectomy 2. Right axillary sentinel node biopsy 3. Injection of blue dye for sentinel node identification Surgeon: Dr Harden Mo Asst: Dr Marcille Blanco Anesthesia: Gen. 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 She has undergone primary chemotherapy with improvement in both the breast lesion and resolution of her lymphadenopathy. We discussed all of the different options we decided to proceed with a mastectomy due to multicentric nature of her tumor. We discussed an axillary dissection but due to the fact that her nodes are clinically negative negative now I reregistered her for the Alliance trial for positive nodes getting primary systemic chemotherapy she will be randomize in the operating room based on the sentinel node results or not participate in the trial. I discussed with this with her at length.  Procedure: After informed consent was obtained the patient was taken to the operating room. She was administered technetium in a standard periareolar fashion. She was given 2 g of intravenous cefazolin. Sequential compression devices were on the her legs. She was then placed under general anesthesia without complication. Her right breast and axilla were then prepped and draped in the standard sterile surgical fashion. A surgical timeout was performed. I injected methylene blue saline mixture in a periareolar fashion also.  Due to the fact she is  not undergoing a reconstruction I made a large elliptical incision including the nipple areolar complex. Flaps were then created to the sternum medially, clavicle superiorly, inframammary crease inferiorly, and latissimus laterally. The breast was then removed from the pectoralis muscle including the pectoralis fascia. I then placed a short stitch superior and a long stitch lateral. I identified eventually what were 5 sentinel lymph nodes with a combination of blue dye as well as radioactivity. There was very minimal radioactivity in the background upon completion. I then obtained hemostasis. Irrigation was performed. I inserted the 2 19 [place] Blake drains and secured these with 2-0 nylon suture. I then awaited intraoperative pathology. I discussed this with Dr. Colonel Bald and all 5 lymph nodes were negative for any tumor but had significant chemotherapy associated changes. We'll await the final pathology results before proceeding any further. I then closed the incision with 3-0 Vicryl suture. I then placed a subcuticular stitch of 4 Monocryl. Dermabond and Steri-Strips are placed overlying this. A dressing was placed. The drains were functional. She tolerated this well was extubated and transferred to the recovery room in stable condition.

## 2013-04-23 NOTE — Interval H&P Note (Signed)
History and Physical Interval Note:  04/23/2013 1:27 PM I have discussed with patient participating in Alliance study for positive node upon presentation and then undergoing primary chemotherapy.  Her nodes are clinically negative as well as on mr and ultrasound.  I have discussed schema of study and rationale with her on phone and in person. She has been preregistered for the study and we will do intraoperative pathology on her sentinel node. Debra Mcintyre  has presented today for surgery, with the diagnosis of right breast cancer  The various methods of treatment have been discussed with the patient and family. After consideration of risks, benefits and other options for treatment, the patient has consented to  Procedure(s): RIGHT TOTAL MASTECTOMY WITH SENTINEL LYMPH NODE BIOPSY (Right) as a surgical intervention .  The patient's history has been reviewed, patient examined, no change in status, stable for surgery.  I have reviewed the patient's chart and labs.  Questions were answered to the patient's satisfaction.     [name]

## 2013-04-23 NOTE — Anesthesia Procedure Notes (Signed)
Procedure Name: LMA Insertion Date/Time: 04/23/2013 1:45 PM Performed by: Orvilla Fus A Pre-anesthesia Checklist: Patient identified, Timeout performed, Emergency Drugs available, Suction available and Patient being monitored Patient Re-evaluated:Patient Re-evaluated prior to inductionOxygen Delivery Method: Circle system utilized Preoxygenation: Pre-oxygenation with 100% oxygen Intubation Type: IV induction Ventilation: Mask ventilation without difficulty LMA: LMA inserted LMA Size: 4.0 Number of attempts: 1 Placement Confirmation: positive ETCO2 and breath sounds checked- equal and bilateral Tube secured with: Tape Dental Injury: Teeth and Oropharynx as per pre-operative assessment

## 2013-04-23 NOTE — Anesthesia Preprocedure Evaluation (Signed)
Anesthesia Evaluation  Patient identified by MRN, date of birth, ID band Patient awake    Reviewed: Allergy & Precautions, H&P , NPO status , Patient's Chart, lab work & pertinent test results, reviewed documented beta blocker date and time   Airway Mallampati: II TM Distance: >3 FB Neck ROM: full    Dental   Pulmonary asthma ,  breath sounds clear to auscultation        Cardiovascular hypertension, On Medications and On Home Beta Blockers + Peripheral Vascular Disease Rhythm:regular     Neuro/Psych TIA Neuromuscular disease negative psych ROS   GI/Hepatic Neg liver ROS, GERD-  Medicated and Controlled,  Endo/Other  Hypothyroidism   Renal/GU negative Renal ROS  negative genitourinary   Musculoskeletal   Abdominal   Peds  Hematology  (+) anemia ,   Anesthesia Other Findings See surgeon's H&P   Reproductive/Obstetrics negative OB ROS                           Anesthesia Physical Anesthesia Plan  ASA: III  Anesthesia Plan: General   Post-op Pain Management:    Induction: Intravenous  Airway Management Planned: LMA  Additional Equipment:   Intra-op Plan:   Post-operative Plan: Extubation in OR  Informed Consent: I have reviewed the patients History and Physical, chart, labs and discussed the procedure including the risks, benefits and alternatives for the proposed anesthesia with the patient or authorized representative who has indicated his/her understanding and acceptance.   Dental Advisory Given  Plan Discussed with: CRNA and Surgeon  Anesthesia Plan Comments:         Anesthesia Quick Evaluation

## 2013-04-23 NOTE — H&P (View-Only) (Signed)
Patient ID: Debra Mcintyre, female   DOB: 04/06/1941, 72 y.o.   MRN: [id number]  Chief Complaint  Patient presents with  . Routine Post Op    reck br/schedue sx    HPI Debra Mcintyre is a 72 y.o. female.   HPI 72 yof initially seen in multidisciplinary clinic with multicentric triple negative tumor.  Her nodes were positive upon presentation.  She decided to undergo primary chemotherapy which she has done pretty well with except for peripheral neuropathy.  She has undergone mr at end of treatment that shows a minimal residual 4 cm irregular area of enhancement in the right breast upper inner quadrant indicating treatment response.  Nodes all appear normal.  She comes in today to discuss surgical therapy.  Past Medical History  Diagnosis Date  . Hypertension   . Cerebrovascular disease, unspecified   . Cerebral aneurysm, nonruptured   . Pure hypercholesterolemia   . Hypothyroidism   . Colonic polyp   . Breast cancer 09/2012    right breast/right axillary lymph node t2,pn1, stage 11b, invasive ductal carcinma, grade 3, triple negative, with an mib-1 of 15%  . History of chemotherapy     Past Surgical History  Procedure Laterality Date  . Left middle cerebral artery angioplasty  2004    by [name] had 2 follow up occurances  . Portacath placement N/A 11/05/2012    Procedure: INSERTION PORT-A-CATH;  Surgeon: Adhya Cocco, MD;  Location: [facility];  Service: General;  Laterality: N/A;  . Breast biopsy Right 10/23/2012  . Breast biopsy Right 10/08/2012    Family History  Problem Relation Age of Onset  . Lung cancer Mother 46  . Heart disease Father   . Colon cancer Brother 59  . Alcohol abuse Brother   . Colon cancer Maternal Uncle     dx in his 80s  . Cancer Maternal Aunt     unknown type  . Ovarian cancer Other 47    Social History History  Substance Use Topics  . Smoking status: Former Smoker -- 1.00 packs/day for 20 years    Types:  Cigarettes    Quit date: 04/02/1986  . Smokeless tobacco: Never Used  . Alcohol Use: No    No Known Allergies  Current Outpatient Prescriptions  Medication Sig Dispense Refill  . aspirin 325 MG tablet Take 325 mg by mouth daily.        . Calcium Carbonate-Vitamin D (CALCIUM 600+D) 600-200 MG-UNIT TABS Take 1 tablet by mouth 2 (two) times daily.        . clopidogrel (PLAVIX) 75 MG tablet TAKE ONE TABLET BY MOUTH EVERY DAY  30 tablet  6  . famotidine (PEPCID) 40 MG tablet Take 1 tablet (40 mg total) by mouth daily.  30 tablet  3  . HYDROcodone-acetaminophen (NORCO/VICODIN) 5-325 MG per tablet       . levothyroxine (SYNTHROID, LEVOTHROID) 125 MCG tablet Take 1 tablet (125 mcg total) by mouth daily.  30 tablet  11  . lidocaine-prilocaine (EMLA) cream Apply topically as needed. Apply over port 1-2 hors before chemo and cover with plastic wrap  30 g  0  . lisinopril (PRINIVIL,ZESTRIL) 20 MG tablet Take 1 tablet (20 mg total) by mouth daily.  30 tablet  6  . loratadine (CLARITIN) 10 MG tablet Take 10 mg by mouth daily.      . LORazepam (ATIVAN) 0.5 MG tablet Take 1 tablet (0.5 mg total) by mouth at bedtime as needed for anxiety (  Nausea or vomiting).  30 tablet  1  . metoprolol succinate (TOPROL-XL) 100 MG 24 hr tablet TAKE ONE TABLET BY MOUTH EVERY DAY  30 tablet  11  . metroNIDAZOLE (METROCREAM) 0.75 % cream As directed      . Multiple Vitamin (MULTIVITAMIN) capsule Take 1 capsule by mouth daily.        . Naproxen Sodium (ALEVE) 220 MG CAPS Take 1 capsule by mouth as needed.      . rosuvastatin (CRESTOR) 20 MG tablet Take 1 tablet (20 mg total) by mouth daily.  30 tablet  11  . venlafaxine XR (EFFEXOR-XR) 75 MG 24 hr capsule TAKE ONE CAPSULE BY MOUTH DAILY.  30 capsule  6   No current facility-administered medications for this visit.    Review of Systems Review of Systems  Constitutional: Negative for fever, chills and unexpected weight change.  HENT: Negative for hearing loss, congestion,  sore throat, trouble swallowing and voice change.   Eyes: Negative for visual disturbance.  Respiratory: Negative for cough and wheezing.   Cardiovascular: Negative for chest pain, palpitations and leg swelling.  Gastrointestinal: Negative for nausea, vomiting, abdominal pain, diarrhea, constipation, blood in stool, abdominal distention and anal bleeding.  Genitourinary: Negative for hematuria, vaginal bleeding and difficulty urinating.  Musculoskeletal: Negative for arthralgias.  Skin: Negative for rash and wound.  Neurological: Negative for seizures, syncope and headaches.  Hematological: Negative for adenopathy. Does not bruise/bleed easily.  Psychiatric/Behavioral: Negative for confusion.    Blood pressure 130/88, pulse 88, temperature 98.4 F (36.9 C), temperature source Temporal, resp. rate 15, height 5' 8" (1.727 m), weight 157 lb 12.8 oz (71.578 kg).  Physical Exam Physical Exam  Constitutional: She appears well-developed and well-nourished.  Eyes: No scleral icterus.  Neck: Neck supple.  Cardiovascular: Normal rate, regular rhythm and normal heart sounds.   Pulmonary/Chest: Effort normal and breath sounds normal. She has no wheezes. She has no rales. Right breast exhibits no inverted nipple, no mass, no nipple discharge, no skin change and no tenderness. Left breast exhibits no inverted nipple, no mass, no nipple discharge, no skin change and no tenderness.  Abdominal: Soft. There is no tenderness.  Lymphadenopathy:    She has no cervical adenopathy.    She has no axillary adenopathy.       Right: No supraclavicular adenopathy present.       Left: No supraclavicular adenopathy present.    Data Reviewed MRI  Assessment    Right multicentric breast cancer, node positive upon presentation s/p primary chemotherapy    Plan    Right MRM  We discussed options and agree again due to initial multicentric tumor and node positive disease that the standard treatment would be  right mrm.  I do think node dissection is appropriate as there is no data to support sentinel node alone right now as there is an ongoing trial to study this.  She is comfortable with this plan.  We discussed possible reconstruction at the same time but she would like to wait as she will likely get radiation.  She asked if she will need radiation even if nodes negative and I told her this is not entirely clear right now either but would be discussed with her after surgery.  We discussed surgery, hospital stay and recovery. Risks including but not limited to bleeding, infection, flap nonviability, wound dehiscence,dvt, pe, stroke/tia were all discussed.  We also discussed risks of alnd including shoulder pain and lymphedema. Will plan on doing surgery once   they return from their trip.       Taleisha Kaczynski 03/31/2013, 10:29 PM    

## 2013-04-23 NOTE — Anesthesia Postprocedure Evaluation (Signed)
  Anesthesia Post-op Note  Patient: Debra Mcintyre  Procedure(s) Performed: Procedure(s): RIGHT TOTAL MASTECTOMY WITH SENTINEL LYMPH NODE BIOPSY (Right)  Patient Location: PACU  Anesthesia Type:General  Level of Consciousness: awake and alert   Airway and Oxygen Therapy: Patient Spontanous Breathing and Patient connected to nasal cannula oxygen  Post-op Pain: mild  Post-op Assessment: Post-op Vital signs reviewed, Patient's Cardiovascular Status Stable, Respiratory Function Stable, Patent Airway and No signs of Nausea or vomiting  Post-op Vital Signs: Reviewed and stable  Complications: No apparent anesthesia complications

## 2013-04-24 ENCOUNTER — Ambulatory Visit: Payer: Medicare Other | Admitting: Physician Assistant

## 2013-04-24 ENCOUNTER — Encounter (HOSPITAL_COMMUNITY): Payer: Self-pay | Admitting: Emergency Medicine

## 2013-04-24 ENCOUNTER — Ambulatory Visit: Payer: Medicare Other

## 2013-04-24 ENCOUNTER — Other Ambulatory Visit: Payer: Medicare Other | Admitting: Lab

## 2013-04-24 LAB — BASIC METABOLIC PANEL
BUN: 11 mg/dL (ref 6–23)
CO2: 22 mEq/L (ref 19–32)
Chloride: 100 mEq/L (ref 96–112)
Creatinine, Ser: 0.54 mg/dL (ref 0.50–1.10)

## 2013-04-24 MED ORDER — OXYCODONE-ACETAMINOPHEN 5-325 MG PO TABS: 1.0000 | ORAL_TABLET | ORAL | Status: DC | PRN

## 2013-04-24 MED ORDER — OXYCODONE HCL 5 MG PO TABS
5.0000 mg | ORAL_TABLET | ORAL | Status: DC | PRN
  Administered 2013-04-24: 10 mg via ORAL
  Filled 2013-04-24: qty 2

## 2013-04-24 NOTE — Progress Notes (Signed)
1 Day Post-Op  Subjective: Some pain at surgical site, otherwise well  Objective: Vital signs in last 24 hours: Temp:  [97.9 F (36.6 C)-99.1 F (37.3 C)] 99.1 F (37.3 C) (09/11 0636) Pulse Rate:  [66-92] 90 (09/11 0636) Resp:  [12-20] 20 (09/11 0636) BP: (98-138)/(47-82) 108/49 mmHg (09/11 0636) SpO2:  [92 %-100 %] 92 % (09/11 0636) Weight:  [163 lb 4.8 oz (74.072 kg)] 163 lb 4.8 oz (74.072 kg) (09/10 1807)    Intake/Output from previous day: 09/10 0701 - 09/11 0700 In: 1940 [P.O.:240; I.V.:1700] Out: 700 [Urine:550; Drains:100; Blood:50] Intake/Output this shift:    General appearance: no distress Incision/Wound:flaps viable, wound clean, drains with serosang output  Lab Results:  No results found for this basename: WBC, HGB, HCT, PLT,  in the last 72 hours BMET  Recent Labs  04/24/13 0720  NA 133*  K 3.7  CL 100  CO2 22  GLUCOSE 93  BUN 11  CREATININE 0.54  CALCIUM 8.7    Anti-infectives:  Assessment/Plan: S/p right mastectomy, snbx  If pain controlled this am, ambulates then can be discharged later today  [facility] 04/24/2013

## 2013-04-25 ENCOUNTER — Encounter (HOSPITAL_COMMUNITY): Payer: Self-pay | Admitting: General Surgery

## 2013-04-25 ENCOUNTER — Telehealth (INDEPENDENT_AMBULATORY_CARE_PROVIDER_SITE_OTHER): Payer: Self-pay | Admitting: *Deleted

## 2013-04-25 NOTE — Telephone Encounter (Signed)
Patient called to ask when she should begin taking her Plavix and Aspirin again?  Patient states she was not given any instructions regarding her dressing changes.  Explained that Elease Hashimoto CMA is working on getting in touch with Dr. Dwain Sarna to ask him about these things then we will give her a call back as soon as we know.  Patient states understanding at this time.

## 2013-04-25 NOTE — Telephone Encounter (Signed)
Called pt back after I spoke to Dr Dwain Sarna. I advised pt that she could restart her aspirin today and the Plavix restart Saturday per Dr Dwain Sarna. I advised pt that she shouldn't really need to change the dressings to the mastectomy unless she got a lot of drainage then she could change it to dry gauze. I will call the pt on Monday to check on the drain status to see when pt needs to come in next week to see Dr Dwain Sarna. The pt understands.

## 2013-04-29 ENCOUNTER — Telehealth (INDEPENDENT_AMBULATORY_CARE_PROVIDER_SITE_OTHER): Payer: Self-pay

## 2013-04-29 NOTE — Telephone Encounter (Signed)
LMOM for pt to call me back I need to check the status of the drainage amount in her drains per Dr Dwain Sarna.

## 2013-04-29 NOTE — Telephone Encounter (Signed)
Patient states on Monday 04-28-13 drain # 1  45cc   04-29-13  22cc /drain # 2   45cc  I expressed the drain needs to have less than 30 cc X 3 days

## 2013-04-29 NOTE — Telephone Encounter (Signed)
Maybe give her a call Thursday and hopefully one can come out this week

## 2013-04-29 NOTE — Telephone Encounter (Signed)
Sending this message to Dr Dwain Sarna.

## 2013-04-29 NOTE — Telephone Encounter (Signed)
LMOM for pt to call me so I can check on the drain amounts per Dr Dwain Sarna.

## 2013-04-30 NOTE — Telephone Encounter (Signed)
Patient called with drain update. Drain 1 was 27cc and 25cc last 2 days. Drain 2 was 28cc yesterday. Made nurse visit for tomorrow to have drain 1 pulled as long as drainage today doesn't increase over 30 cc's. Advised we will only pull one tomorrow. Patient states understanding.

## 2013-05-01 ENCOUNTER — Ambulatory Visit (INDEPENDENT_AMBULATORY_CARE_PROVIDER_SITE_OTHER): Payer: Medicare Other

## 2013-05-01 ENCOUNTER — Other Ambulatory Visit: Payer: Self-pay | Admitting: Oncology

## 2013-05-01 DIAGNOSIS — Z4803 Encounter for change or removal of drains: Secondary | ICD-10-CM

## 2013-05-01 NOTE — Progress Notes (Signed)
Pt comes in today for nurse only visit for drain pull post right mastectomy.  Drain #3 is still producing more than 30mL in 24 hours.  I did not pull drain #3.  Drain #1 has only produced 17mL in the last 24 hours, and the fluid looked good.  Light pink ting.  I pulled drain #1 and dressed with triple antibiotic ointment and clean gauze.  Pt advised to call office once drain #3 is producing less than 30 mL.   Pt asked about pain medication refill.  I advised pt that I could not call in her current pain med (oxycodone-acetaminophen) but that I would be happy to call in hydrocodone-acetaminophen to her pharmacy.  Pt stated that this would work for her, since she is trying to take less pain medications.  Hydrocodone-acetaminophen 5-325 #30 no refills called into [facility] on [address].

## 2013-05-06 ENCOUNTER — Ambulatory Visit (INDEPENDENT_AMBULATORY_CARE_PROVIDER_SITE_OTHER): Payer: Medicare Other | Admitting: General Surgery

## 2013-05-06 NOTE — Progress Notes (Unsigned)
Patient arrived for nurse only drain  Removal rt breast; No redness, drainage,odor noted afebrile. 10 cc of Light reddish fluid in drain bub noted; Cleansed rt drain site with cholera prep; removed suture ; removed drain; cleansed with chorea prep ; applied 4x4 dressing secured with tape. Advised patient to call if temp 100.3 or greater, any odor serous drainage , redness noted at site; patient verbalized understanding

## 2013-05-08 NOTE — Discharge Summary (Signed)
Physician Discharge Summary  Patient ID: Debra Mcintyre MRN: [id number] DOB/AGE: 03/17/1941 72 y.o.  Admit date: 04/23/2013 Discharge date: 05/08/2013  Admission Diagnoses: Node positive breast cancer S/p primary chemotherapy  Discharge Diagnoses:  Active Problems:   * No active hospital problems. *   Discharged Condition: good  Hospital Course: 57 yof who underwent primary chemotherapy for right breast cancer that was node positive upon presentation.  She was admitted and placed on study where she underwent right mastectomy and snbx with drain placement.  She was discharged doing well.  Consults: None  Significant Diagnostic Studies: none  Treatments: surgery: see above    Disposition: 01-Home or Self Care   Future Appointments Provider Department Dept Phone   05/12/2013 1:40 PM [name], MD [facility] [place] [phone]   05/13/2013 11:30 AM Mauri [facility] MEDICAL ONCOLOGY [phone]   05/13/2013 12:00 PM Lowella Dell, MD [facility] MEDICAL ONCOLOGY [phone]   05/13/2013 1:30 PM [facility] RADIATION ONCOLOGY [phone]   05/13/2013 2:00 PM Maryln Gottron, MD [facility] RADIATION ONCOLOGY [phone]       Medication List         aspirin 325 MG tablet  Take 325 mg by mouth daily.     CALCIUM + D PO  Take 1 tablet by mouth 2 (two) times daily.     clopidogrel 75 MG tablet  Commonly known as:  PLAVIX  TAKE ONE TABLET BY MOUTH EVERY DAY     famotidine 40 MG tablet  Commonly known as:  PEPCID  Take 1 tablet (40 mg total) by mouth daily.     levothyroxine 125 MCG tablet  Commonly known as:  SYNTHROID, LEVOTHROID  Take 1 tablet (125 mcg total) by mouth daily.     lisinopril 20 MG tablet  Commonly known as:  PRINIVIL,ZESTRIL  Take 1 tablet (20 mg total) by mouth daily.     LORazepam 0.5 MG tablet  Commonly known as:  ATIVAN  Take 1  tablet (0.5 mg total) by mouth at bedtime as needed for anxiety (Nausea or vomiting).     metoprolol succinate 100 MG 24 hr tablet  Commonly known as:  TOPROL-XL  TAKE ONE TABLET BY MOUTH EVERY DAY     metroNIDAZOLE 0.75 % cream  Commonly known as:  METROCREAM  Apply 1 application topically 2 (two) times daily. As directed     multivitamin capsule  Take 1 capsule by mouth daily.     oxyCODONE-acetaminophen 5-325 MG per tablet  Commonly known as:  ROXICET  Take 1-2 tablets by mouth every 4 (four) hours as needed for pain.     oxyCODONE-acetaminophen 5-325 MG per tablet  Commonly known as:  PERCOCET/ROXICET  Take 1 tablet by mouth every 6 (six) hours as needed for pain.     rosuvastatin 20 MG tablet  Commonly known as:  CRESTOR  Take 1 tablet (20 mg total) by mouth daily.     venlafaxine XR 75 MG 24 hr capsule  Commonly known as:  EFFEXOR-XR  TAKE ONE CAPSULE BY MOUTH DAILY.           Follow-up Information   Follow up with [facility], MD In 1 week.   Specialty:  General Surgery   Contact information:   [address] [place] [address] [phone]       Signed: [name] 05/08/2013, 12:29 PM

## 2013-05-09 ENCOUNTER — Encounter: Payer: Self-pay | Admitting: Radiation Oncology

## 2013-05-09 NOTE — Progress Notes (Signed)
Location of Breast Cancer: right upper inner  Histology per Pathology Report:  10/08/12 Breast, right, needle core biopsy, medial - INVASIVE DUCTAL CARCINOMA WITH ASSOCIATED NECROSIS AND CALCIFICATION. - EXTENSIVE LYMPH/VASCULAR INVASION IDENTIFIED.  10/23/12 Diagnosis Breast, right, needle core biopsy, UIQ - DUCTAL CARCINOMA IN SITU WITH FOCI WORRISOME FOR INVASION. SEE COMMENT. - LYMPHOVASCULAR INVASION IDENTIFIED.  04/23/13 1. Lymph node, sentinel, biopsy, Right axillary #1 - THERE IS NO EVIDENCE OF CARCINOMA IN 1 OF 1 LYMPH NODE (0/1). 2. Lymph node, sentinel, biopsy, Right axillary #2 - MICROMETASTATIC CARCINOMA IN 1 OF 1 LYMPH NODE (1 MIC/1). - SEE COMMENT. 3. Lymph node, sentinel, biopsy, Right axillary #3 - MICROMETASTATIC CARCINOMA IN 1 OF 1 LYMPH NODE (1 MIC/1). - SEE COMMENT. 4. Lymph node, sentinel, biopsy, Right axillary #4 - THERE IS NO EVIDENCE OF CARCINOMA IN 1 OF 1 LYMPH NODE (0/1). 5. Lymph node, sentinel, biopsy, Right axillary #5 - THERE IS NO EVIDENCE OF CARCINOMA IN 1 OF 1 LYMPH NODE (0/1). 6. Breast, simple mastectomy, Right - INVASIVE DUCTAL CARCINOMA, GRADE III/III, SPANNING 1.0 CM. - THE SURGICAL RESECTION MARGINS ARE NEGATIVE FOR CARCINOMA. - THERE IS NO EVIDENCE OF CARCINOMA IN 5 OF 5 LYMPH NODES (0/5). - SEE ONCOLOGY TABLE BELOW. 7. Breast, excision - BENIGN BREAST PARENCHYMA. - THERE IS NO EVIDENCE OF MALIGNANCY.  Receptor Status: ER(-), PR (-), Her2-neu (+)  Did patient present with symptoms (if so, please note symptoms) or was this found on screening mammography?: screening mammogram  Past/Anticipated interventions by surgeon, if any: 04/23/13 right simple mastectomy, nodes biopsied  Past/Anticipated interventions by medical oncology, if any: Chemotherapy: treated in the neoadjuvant setting, completed 4 dose dense cycles of doxorubicin/ cyclophosphamide 12/23/2012, followed by 7 weekly doses of paclitaxel and carboplatin completed  03/03/2013.  Lymphedema issues, if any:  none  Pain issues, if any:  Post op soreness right breast  SAFETY ISSUES:  Prior radiation? no  Pacemaker/ICD? No  Possible current pregnancy? no  Is the patient on methotrexate? 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 Pt denies pain, does take Hydrocodone at night for right breast post op soreness. She paces herself w/activities, no loss of appetite. She reports numbness of fingertips, toes and feet.      Glennie Hawk, RN 05/09/2013,11:28 AM

## 2013-05-12 ENCOUNTER — Other Ambulatory Visit (INDEPENDENT_AMBULATORY_CARE_PROVIDER_SITE_OTHER): Payer: Self-pay | Admitting: *Deleted

## 2013-05-12 ENCOUNTER — Ambulatory Visit (INDEPENDENT_AMBULATORY_CARE_PROVIDER_SITE_OTHER): Payer: Medicare Other | Admitting: General Surgery

## 2013-05-12 ENCOUNTER — Telehealth: Payer: Self-pay | Admitting: *Deleted

## 2013-05-12 ENCOUNTER — Encounter (INDEPENDENT_AMBULATORY_CARE_PROVIDER_SITE_OTHER): Payer: Self-pay | Admitting: General Surgery

## 2013-05-12 VITALS — BP 140/90 | HR 104 | Temp 99.2°F | Resp 14 | Ht 69.0 in | Wt 151.4 lb

## 2013-05-12 DIAGNOSIS — C50919 Malignant neoplasm of unspecified site of unspecified female breast: Secondary | ICD-10-CM

## 2013-05-12 DIAGNOSIS — Z09 Encounter for follow-up examination after completed treatment for conditions other than malignant neoplasm: Secondary | ICD-10-CM

## 2013-05-12 MED ORDER — UNABLE TO FIND: Status: DC

## 2013-05-12 NOTE — Telephone Encounter (Signed)
SN was contacted by Dr. Dwain Sarna.   i have called and lmomtcb for the pt to call me back to follow up on her problems.

## 2013-05-12 NOTE — Progress Notes (Signed)
Subjective:     Patient ID: Debra Mcintyre, female   DOB: [birth year], 72 y.o.   MRN: [id number]  HPI This is a 72 year old female who underwent primary chemotherapy followed by a right mastectomy with sentinel lymph node biopsy as we're attempting to put her on study. She has done well for this. She has both drains out at this point. She is having some shortness of breath with standing. She has no or more leg swelling. She has no cough or any fever associated with this.  We discussed her path today.  Her arm is a little tight.  Review of Systems     Objective:   Physical Exam Healing right mastectomy incision without infection or seroma   lungs clear bilaterally cv rrr Ue/le no edema Assessment:     S/p right mastectomy     Plan:     She is doing well. I'm going to let Dr. Kriste Basque know about her shortness of breath. I can find really a source of this today. I gave her some exercises to do. I will refer her to physical therapy, lymphedema prevention. She is due to see both medical and radiation oncology tomorrow. I do not think she needs any further surgery. They were 5 intramammary nodes and 5 sentinel lymph nodes and I think that radiation therapy will be adequate for her in terms of local control from this point. Also gave her a prescription for postmastectomy supplies as well. I will plan on seeing her back in 6 months or sooner if needed.

## 2013-05-13 ENCOUNTER — Telehealth: Payer: Self-pay | Admitting: *Deleted

## 2013-05-13 ENCOUNTER — Other Ambulatory Visit (HOSPITAL_BASED_OUTPATIENT_CLINIC_OR_DEPARTMENT_OTHER): Payer: Medicare Other | Admitting: Lab

## 2013-05-13 ENCOUNTER — Ambulatory Visit (HOSPITAL_BASED_OUTPATIENT_CLINIC_OR_DEPARTMENT_OTHER): Payer: Medicare Other | Admitting: Oncology

## 2013-05-13 ENCOUNTER — Ambulatory Visit
Admission: RE | Admit: 2013-05-13 | Discharge: 2013-05-13 | Disposition: A | Discharge status: Home / Self Care | Payer: Medicare Other | Source: Ambulatory Visit | Attending: Radiation Oncology | Admitting: Radiation Oncology

## 2013-05-13 ENCOUNTER — Encounter: Payer: Self-pay | Admitting: Radiation Oncology

## 2013-05-13 VITALS — BP 119/77 | HR 99 | Temp 98.4°F | Resp 20 | Ht 69.0 in | Wt 150.2 lb

## 2013-05-13 VITALS — BP 136/80 | HR 79 | Temp 98.1°F | Resp 20 | Wt 152.4 lb

## 2013-05-13 DIAGNOSIS — C50119 Malignant neoplasm of central portion of unspecified female breast: Secondary | ICD-10-CM | POA: Diagnosis not present

## 2013-05-13 DIAGNOSIS — Z171 Estrogen receptor negative status [ER-]: Secondary | ICD-10-CM | POA: Diagnosis not present

## 2013-05-13 DIAGNOSIS — C50411 Malignant neoplasm of upper-outer quadrant of right female breast: Secondary | ICD-10-CM

## 2013-05-13 DIAGNOSIS — C50919 Malignant neoplasm of unspecified site of unspecified female breast: Secondary | ICD-10-CM | POA: Diagnosis not present

## 2013-05-13 DIAGNOSIS — C773 Secondary and unspecified malignant neoplasm of axilla and upper limb lymph nodes: Secondary | ICD-10-CM | POA: Diagnosis not present

## 2013-05-13 DIAGNOSIS — R0609 Other forms of dyspnea: Secondary | ICD-10-CM | POA: Diagnosis not present

## 2013-05-13 DIAGNOSIS — D0590 Unspecified type of carcinoma in situ of unspecified breast: Secondary | ICD-10-CM

## 2013-05-13 DIAGNOSIS — R0989 Other specified symptoms and signs involving the circulatory and respiratory systems: Secondary | ICD-10-CM | POA: Insufficient documentation

## 2013-05-13 DIAGNOSIS — C50111 Malignant neoplasm of central portion of right female breast: Secondary | ICD-10-CM

## 2013-05-13 DIAGNOSIS — C50911 Malignant neoplasm of unspecified site of right female breast: Secondary | ICD-10-CM

## 2013-05-13 DIAGNOSIS — R209 Unspecified disturbances of skin sensation: Secondary | ICD-10-CM | POA: Insufficient documentation

## 2013-05-13 LAB — COMPREHENSIVE METABOLIC PANEL (CC13)
AST: 17 U/L (ref 5–34)
Albumin: 3.5 g/dL (ref 3.5–5.0)
Alkaline Phosphatase: 65 U/L (ref 40–150)
BUN: 11.5 mg/dL (ref 7.0–26.0)
Potassium: 4 mEq/L (ref 3.5–5.1)
Sodium: 139 mEq/L (ref 136–145)
Total Protein: 7 g/dL (ref 6.4–8.3)

## 2013-05-13 LAB — CBC WITH DIFFERENTIAL/PLATELET
BASO%: 1 % (ref 0.0–2.0)
EOS%: 3 % (ref 0.0–7.0)
MCH: 28.1 pg (ref 25.1–34.0)
MCHC: 33.1 g/dL (ref 31.5–36.0)
MCV: 85 fL (ref 79.5–101.0)
MONO%: 10.3 % (ref 0.0–14.0)
RBC: 4.32 10*6/uL (ref 3.70–5.45)
RDW: 15.5 % — ABNORMAL HIGH (ref 11.2–14.5)

## 2013-05-13 NOTE — Progress Notes (Signed)
ID: Debra Mcintyre   DOB: [birth year]  MR#: [id number]  ION#:[id number]  PCP: Debra Mcalpine, MD GYN: Debra Mcintyre SU: Debra Mcintyre OTHER MD: Debra Mcintyre, Debra Mcintyre, Debra Mcintyre,   HISTORY OF PRESENT ILLNESS: Debra Mcintyre had screening mammography at [facility] 04/10/2012 raising the question of some axillary lymph nodes on the right. Additional views of the right axilla and right axillary ultrasound performed 04/04/2012 showed 3 lymph nodes that appeared larger than prior. The largest measured 1.5 cm. 2 of them had cortical thickening. Close followup was suggested, and repeat right axillary ultrasound 06/25/2012 showed no significant change in the lymph nodes in question. Followup ultrasound in 3 months was recommended, but in the interim the patient saw her primary physician, Debra Mcintyre and ADL, and he set her up for a chest CT on 07/08/2012, which showed a mild asymmetric density in the upper mid right breast. There was a prominent right axillary lymph node measuring 1.9 cm, with a few smaller adjacent nodes asymmetric with compared to the left side. Again, short interval followup was recommended, and on 10/07/2012 the patient had digital right mammography and ultrasonography, now at the breast Center. Debra Mcintyre noted pleomorphic calcifications spanning an area of 6.3 cm without associated mass. Physical exam was unremarkable. Ultrasound showed an area of adenopathy in the right axilla measuring 1.6 cm, with a second lymph node with a thickened cortex measuring 1.2 cm. No suspicious mass was seen by ultrasound in the right breast.  Biopsy of the larger axillary lymph node on 10/07/2012 showed ([id number]) an invasive ductal carcinoma, triple negative, with an MIB-1 of 66%. Biopsy of the right breast the next day, [id number]) showed invasive ductal carcinoma, grade 3. Breast MRI 10/14/2012 showed a total area of irregular enhancement in the right breast measuring up to 12 cm. MRI guided biopsy of an  area in the upper inner quadrant of the right breast on 10/23/2012 showed ([id number]) ductal carcinoma in situ, with foci worrisome for invasion.  The patient's subsequent history is as detailed below  INTERVAL HISTORY: Debra Mcintyre returns today for followup of her locally advanced right breast cancer accompanied by her husband Debra Mcintyre. Since her last visit here she had her definitive surgery, namely a right mastectomy and sentinel lymph node sampling, and this showed only an area of approximately 1 cm with intralymphatic residual invasive ductal carcinoma, grade 3. 2/10 lymph nodes were involved with micrometastatic deposit. Margins were clear. Repeat prognostic panel again showed a triple negative tumor.  REVIEW OF SYSTEMS: She did well with her surgery, with minimal pain, although she still takes one Percocet at bedtime to help her get through the night. Her right arm is uncomfortable and does sometimes wake her up. There was no bleeding or fever and she had both her drains removed the for 2 weeks postop. She does feel short of breath at times. This may or may not be related to activity. She takes her dogs for walks with little difficulty. At church when she stood up to sing she fell short of breath and had to sit down again. There is no pleurisy, hemoptysis, or cough. This is a very intermittent symptoms. What is bothering her the most is numbness involving chiefly the left big toe but also both feet to the balls of the feet and sometimes her legs also feel. There is very little numbness in her hands. A detailed review of systems was otherwise noncontributory  PAST MEDICAL HISTORY: Past Medical History  Diagnosis Date  .  Cerebrovascular disease, unspecified   . Cerebral aneurysm, nonruptured   . Pure hypercholesterolemia   . Hypothyroidism   . Colonic polyp   . History of chemotherapy   . TIA (transient ischemic attack)   . Hypertension     Does not see a cardiologist  . Neuropathy   . Breast  cancer 09/2012    right breast/right axillary lymph node t2,pn1, stage 11b, invasive ductal carcinma, grade 3, triple negative, with an mib-1 of 15%    PAST SURGICAL HISTORY: Past Surgical History  Procedure Laterality Date  . Left middle cerebral artery angioplasty  2004    by [name] had 2 follow up occurances  . Portacath placement N/A 11/05/2012    Procedure: INSERTION PORT-A-CATH;  Surgeon: Debra Loron, MD;  Location: [name];  Service: General;  Laterality: N/A;  . Breast biopsy Right 10/23/2012  . Breast biopsy Right 10/08/2012  . Mastectomy w/ sentinel node biopsy Right 04/23/2013    Procedure: RIGHT TOTAL MASTECTOMY WITH SENTINEL LYMPH NODE BIOPSY;  Surgeon: Debra Loron, MD;  Location: [facility] OR;  Service: General;  Laterality: Right;    FAMILY HISTORY Family History  Problem Relation Age of Onset  . Lung cancer Mother 62  . Heart disease Father   . Colon cancer Brother 26  . Alcohol abuse Brother   . Colon cancer Maternal Uncle     dx in his 12s  . Cancer Maternal Aunt     unknown type  . Ovarian cancer Other 43   the patient's father died at the age of 75. The patient's mother died at the age of 24 from lung cancer. She was not a smoker. The cancer was diagnosed when she was 72 years old. The patient's mother had 2 sisters and one brother. One of those sisters had lung cancer and a brother had colon cancer, both diagnosed in their 62s. The patient herself has one brother who died from colon cancer at the age of 24. That brother has a daughter who was diagnosed with uterine cancer at the age of 41. All this raises the question of possible Lynch syndrome and I have referred the patient for genetics counseling.  GYNECOLOGIC HISTORY: Menarche age 61, menopause around 38. The patient is GX P0. She took birth control pills for many years with no complications.  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 Her stepchildren are Debra Mcintyre who lives in [place]" and is financial vice president of [employer], and [place] who lives in [place] and works as a Transport planner. The patient has 5 grandchildren. She is currently not a church attender   ADVANCED DIRECTIVES: Not in place; this was discussed 10/29/2012 and the patient was advised to complete a healthcare power of attorney document  HEALTH MAINTENANCE: History  Substance Use Topics  . Smoking status: Former Smoker -- 1.00 packs/day for 20 years    Types: Cigarettes    Quit date: 04/02/1986  . Smokeless tobacco: Never Used  . Alcohol Use: No     Colonoscopy: 2012  PAP: 2012  Bone density:  Lipid panel:  No Known Allergies  Current Outpatient Prescriptions  Medication Sig Dispense Refill  . aspirin 325 MG tablet Take 325 mg by mouth daily.        . Calcium Carbonate-Vitamin D (CALCIUM + D PO) Take 1 tablet by mouth 2 (two) times daily.      Marland Kitchen  clopidogrel (PLAVIX) 75 MG tablet TAKE ONE TABLET BY MOUTH EVERY DAY  30 tablet  6  . famotidine (PEPCID) 40 MG tablet Take 1 tablet (40 mg total) by mouth daily.  30 tablet  3  . HYDROcodone-acetaminophen (NORCO/VICODIN) 5-325 MG per tablet       . levothyroxine (SYNTHROID, LEVOTHROID) 125 MCG tablet Take 1 tablet (125 mcg total) by mouth daily.  30 tablet  11  . lisinopril (PRINIVIL,ZESTRIL) 20 MG tablet Take 1 tablet (20 mg total) by mouth daily.  30 tablet  6  . LORazepam (ATIVAN) 0.5 MG tablet Take 1 tablet (0.5 mg total) by mouth at bedtime as needed for anxiety (Nausea or vomiting).  30 tablet  1  . metoprolol succinate (TOPROL-XL) 100 MG 24 hr tablet TAKE ONE TABLET BY MOUTH EVERY DAY  30 tablet  11  . metroNIDAZOLE (METROCREAM) 0.75 % cream Apply 1 application topically 2 (two) times daily. As directed      . Multiple Vitamin (MULTIVITAMIN) capsule Take 1 capsule by mouth daily.        Marland Kitchen oxyCODONE-acetaminophen (PERCOCET/ROXICET) 5-325  MG per tablet Take 1 tablet by mouth every 6 (six) hours as needed for pain.      Marland Kitchen oxyCODONE-acetaminophen (ROXICET) 5-325 MG per tablet Take 1-2 tablets by mouth every 4 (four) hours as needed for pain.  30 tablet  0  . rosuvastatin (CRESTOR) 20 MG tablet Take 1 tablet (20 mg total) by mouth daily.  30 tablet  11  . UNABLE TO FIND Rx: L8000- Post Surgical Bras (Quantity: 6) L8020- Non-Silicone Breast Prosthesis (Quantity: 1) L8030- Silicone Breast Prosthesis (Quantity: 1) Dx: 174.9; Right Mastectomy  1 each  0  . venlafaxine XR (EFFEXOR-XR) 75 MG 24 hr capsule TAKE ONE CAPSULE BY MOUTH DAILY.  30 capsule  6   No current facility-administered medications for this visit.    OBJECTIVE: Middle-aged white woman in no acute distress Filed Vitals:   05/13/13 1151  BP: 119/77  Pulse: 99  Temp: 98.4 F (36.9 C)  Resp: 20     Body mass index is 22.17 kg/(m^2).    ECOG FS: 1 Filed Weights   05/13/13 1151  Weight: 150 lb 3.2 oz (68.13 kg)   Sclerae unicteric, pupils equal round and reactive to light Oropharynx clear, no ulcerations or thrush No cervical or supraclavicular adenopathy; Lungs show no rales or wheezes, good excursion bilaterally Heart regular rate and rhythm, no murmur appreciated Abdomen soft, nontender, positive bowel sounds MSK no focal spinal tenderness, no peripheral edema Neuro: nonfocal, well oriented, positive affect Breasts: The right breast is status post recent mastectomy. There is no dehiscence or evidence of erythema, swelling, or unusual tenderness. The right axilla is benign. The left breast is unremarkable   LAB RESULTS: Lab Results  Component Value Date   WBC 6.4 05/13/2013   NEUTROABS 4.2 05/13/2013   HGB 12.1 05/13/2013   HCT 36.7 05/13/2013   MCV 85.0 05/13/2013   PLT 269 05/13/2013      Chemistry      Component Value Date/Time   NA 139 05/13/2013 1134   NA 133* 04/24/2013 0720   K 4.0 05/13/2013 1134   K 3.7 04/24/2013 0720   CL 100 04/24/2013 0720    CL 102 01/28/2013 0909   CO2 25 05/13/2013 1134   CO2 22 04/24/2013 0720   BUN 11.5 05/13/2013 1134   BUN 11 04/24/2013 0720   CREATININE 0.7 05/13/2013 1134   CREATININE 0.54 04/24/2013 0720  Component Value Date/Time   CALCIUM 9.9 05/13/2013 1134   CALCIUM 8.7 04/24/2013 0720   ALKPHOS 65 05/13/2013 1134   ALKPHOS 47 06/04/2012 1124   AST 17 05/13/2013 1134   AST 27 06/04/2012 1124   ALT 12 05/13/2013 1134   ALT 32 06/04/2012 1124   BILITOT 0.69 05/13/2013 1134   BILITOT 1.1 06/04/2012 1124       STUDIES: US Breast Right  04/23/2013   *RADIOLOGY REPORT*  Clinical Data:  72 year old female with history of multicentric right breast cancer and right axillary lymph node metastases status post neoadjuvant chemotherapy, scheduled for right mastectomy later today.  DIGITAL DIAGNOSTIC RIGHT MAMMOGRAM  AND RIGHT BREAST ULTRASOUND:  Comparison:  Previous mammograms.  Findings:  ACR Breast Density Category c:  The breast tissue is heterogeneously dense, which may obscure small masses.  A dumbbell shaped biopsy clip is present in the upper inner right breast at the approximate 1 o'clock position.  There is a focal asymmetry adjacent to the biopsy clip. Pleomorphic calcifications are again seen in the upper slightly inner right breast spanning a distance of 6 cm.  Mammographic images were processed with CAD.  Physical examination of the upper inner right breast does not reveal a discrete palpable mass although the entire upper inner right breast does feel uniformly firm to palpation.  Targeted ultrasound of the upper inner right breast was performed. There is an irregular hypoechoic mass in the right breast at 1 o'clock 3 cm from the nipple containing biopsy clip and measuring 0.8 x 0.4 x 1.6 cm.  This extends to the skin surface and may represent scar.  No suspicious right axillary lymph nodes are identified.  IMPRESSION:  1.  Irregular mass in the upper inner right breast measuring up to 1.6 cm may  represent scar given this abnormality extends to the skin surface, although underlying residual malignancy cannot be excluded. 2.  Persistent pleomorphic calcifications spanning a distance of 6 cm in the upper inner right breast. 3.  No suspicious right axillary lymph nodes seen.  RECOMMENDATION: The patient is scheduled for right breast mastectomy later today.  I have discussed the findings and recommendations with the patient. Results were also provided in writing at the conclusion of the visit.  If applicable, a reminder letter will be sent to the patient regarding the next appointment.  BI-RADS CATEGORY 6:  Known biopsy-proven malignancy - appropriate action should be taken.   Original Report Authenticated By: Edwin Cap, M.D.   Nm Sentinel Node Inj-no Rpt (breast)  04/23/2013   CLINICAL DATA: right axillary sentinel node biopsy   Sulfur colloid was injected intradermally by the nuclear medicine  technologist for breast cancer sentinel node localization.    Mm Digital Diagnostic Unilat R  04/23/2013   *RADIOLOGY REPORT*  Clinical Data:  72 year old female with history of multicentric right breast cancer and right axillary lymph node metastases status post neoadjuvant chemotherapy, scheduled for right mastectomy later today.  DIGITAL DIAGNOSTIC RIGHT MAMMOGRAM  AND RIGHT BREAST ULTRASOUND:  Comparison:  Previous mammograms.  Findings:  ACR Breast Density Category c:  The breast tissue is heterogeneously dense, which may obscure small masses.  A dumbbell shaped biopsy clip is present in the upper inner right breast at the approximate 1 o'clock position.  There is a focal asymmetry adjacent to the biopsy clip. Pleomorphic calcifications are again seen in the upper slightly inner right breast spanning a distance of 6 cm.  Mammographic images were processed with CAD.  Physical examination  of the upper inner right breast does not reveal a discrete palpable mass although the entire upper inner right breast  does feel uniformly firm to palpation.  Targeted ultrasound of the upper inner right breast was performed. There is an irregular hypoechoic mass in the right breast at 1 o'clock 3 cm from the nipple containing biopsy clip and measuring 0.8 x 0.4 x 1.6 cm.  This extends to the skin surface and may represent scar.  No suspicious right axillary lymph nodes are identified.  IMPRESSION:  1.  Irregular mass in the upper inner right breast measuring up to 1.6 cm may represent scar given this abnormality extends to the skin surface, although underlying residual malignancy cannot be excluded. 2.  Persistent pleomorphic calcifications spanning a distance of 6 cm in the upper inner right breast. 3.  No suspicious right axillary lymph nodes seen.  RECOMMENDATION: The patient is scheduled for right breast mastectomy later today.  I have discussed the findings and recommendations with the patient. Results were also provided in writing at the conclusion of the visit.  If applicable, a reminder letter will be sent to the patient regarding the next appointment.  BI-RADS CATEGORY 6:  Known biopsy-proven malignancy - appropriate action should be taken.   Original Report Authenticated By: Edwin Cap, M.D.   ASSESSMENT: 72 y.o. [place] woman   (1)  status post right breast and right axillary lymph node biopsies February 2014 of a clinical T2, pN1, stage IIB,  invasive ductal carcinoma, grade 3, triple negative, with an MIB-1 of 15%  (2)  a third biopsy from a different quadrant 10/23/2012 showed ductal carcinoma in situ  (3)  currently being treated in the neoadjuvant setting, completed 4 dose dense cycles of doxorubicin/ cyclophosphamide 12/23/2012, followed by 7 weekly doses of paclitaxel and carboplatin completed 03/03/2013.  (4) status post right mastectomy and sentinel lymph node sampling 04/23/2013 for a residual pT1b pN1 invasive ductal carcinoma, grade 3, again triple negative [2/10 sample lymph nodes were  involved by tumor, both with micrometastatic deposits]  (5) testing of family members negative for BRCA mutation; no further testing planned at this point   PLAN:  Carney Bern niece case was discussed in the multidisciplinary breast cancer conference 05/07/2013 and the consensus was that no further axillary surgery was needed, but that she would definitely benefit from radiation on to include the axilla. The final pathology showed a borderline her 2 reading of 1.98, but taken together with the original negative biopsy, this does not warrant proceeding with anti-HER-2 therapy.  Accordingly Jadaya is now ready to start her radiation treatments. Over the next several months, her sense of taste should return, she should have more energy, and hopefully also her nails I will be normalizing. The problem with neuropathy is less clear. This may be permanent, it may partially resolved, or it may resolve completely. I am hopeful see her again in a couple of months we will see definite betterment. Otherwise I encouraged Jeannie to start an exercise program, and to call us if she has any new problems of concern before the next visit here.    Lowella Dell MD   05/13/2013

## 2013-05-13 NOTE — Progress Notes (Signed)
Please see the Nurse Progress Note in the MD Initial Consult Encounter for this patient. 

## 2013-05-13 NOTE — Telephone Encounter (Signed)
Called and spoke with pt and she stated that she has been having the SOB on and off for a while, and this happens when she goes to stand up and at times when she is out walking around.  Pt did state that when she was walking today she was fine, but this does happen daily.  Appt has been made for pt to come in and see SN.  Pt is aware of appt. Nothing further is needed.

## 2013-05-13 NOTE — Telephone Encounter (Signed)
appts made and printed. Pt will be out of town on 07/17/13. gv appts for 07/24/13...td

## 2013-05-13 NOTE — Addendum Note (Signed)
Addended by: Billey Co on: 05/13/2013 05:55 PM   Modules accepted: Orders

## 2013-05-13 NOTE — Progress Notes (Signed)
CC: Dr. Alroy Dust, Dr. [name]  Followup note:  Diagnosis: Pathologic stage IIA (ypT1 ypN29mi M0) invasive ductal carcinoma of the right breast  History: [name] is a pleasant 72 year old female who is seen today for consideration of post mastectomy radiation therapy in the management of her pathologic stage ypT1 ypN74mi invasive ductal/DCIS of the right breast. A screening mammogram in August of 2013 raised the question of questionable right axillary lymph nodes. A right axillary ultrasound in August showed 3 lymph nodes that appeared larger than seen previously. A followup ultrasound on 06/25/2012 did not show any significant interval change. A followup study was recommended. She did have a CT scan through her primary care physician, Dr. Alroy Dust and this showed mild asymmetry within the upper mid right breast. There was also a prominent right axillary lymph node measure 1.9 cm with a few smaller adjacent lymph nodes a. On 10/07/2012 she had mammography at the [facility] and she was seen to have pleomorphic calcifications spanning an area of 6.3 cm without associated mass. Ultrasound the right axilla showed a 1.6 cm, and also 1.2 cm axillary lymph node. Biopsy of the larger axillary lymph node 07/07/2013 showed invasive ductal carcinoma, triple negative with a proliferation index of 66%. Biopsy of the breast the following day confirmed invasive ductal carcinoma, grade 3. Breast MR on March 3 showed a total area of irregular enhancement in the right breast measuring up to 12 cm. MRI guided biopsy of the upper inner quadrant on 10/23/2012 showed DCIS with foci worrisome for invasion. Her staging workup included a PET scan on 10/24/2012 which showed a right primary breast mass in addition to 2 hypermetabolic right axillary lymph nodes. She had a residual tumor measure 1.0 cm with the closest margin being 3.0 cm. She will onto receive neoadjuvant chemotherapy under the direction of Dr.  Darnelle Catalan. Her weekly paclitaxel/carboplatin was discontinued after 7 weekly doses secondary to worsening neuropathy. She underwent right mastectomy and axillary dissection with 2 of 5 sentinel lymph nodes containing micrometastatic disease and 5 remaining axillary lymph nodes free of metastatic disease for a total of 2 of 10 lymph nodes containing micrometastases. She is doing well postoperatively, although she does have dyspnea on exertion for which she uses to see Dr. Kriste Basque.  Physical examination: Alert and oriented. Filed Vitals:   05/13/13 1337  BP: 136/80  Pulse: 79  Temp: 98.1 F (36.7 C)  Resp: 20   Head and neck examination remarkable for wearing a wig. Nodes: There is no palpable cervical, supraclavicular, or axillary lymphadenopathy. Chest: Right-sided mastectomy, without visible or palpable evidence for recurrent disease. Lungs clear. Left breast without masses or lesions. Heart: Regular rate and rhythm. Abdomen: Without hepatomegaly. Extremities: Without lymphedema.  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 We discussed indications for post mastectomy radiation therapy. We generally recommend postoperative radiation therapy for patients with clinical stage III disease complicated following an incomplete pathologic response . We discussed the potential acute and late toxicities of radiation therapy. Consent was signed today.  Plan: She'll return for simulation/treatment planning next week.  30 minutes was spent face-to-face with the patient, primarily counseling patient and coordinating her care.

## 2013-05-19 ENCOUNTER — Ambulatory Visit
Admission: RE | Admit: 2013-05-19 | Discharge: 2013-05-19 | Disposition: A | Discharge status: Home / Self Care | Payer: Medicare Other | Source: Ambulatory Visit | Attending: Radiation Oncology | Admitting: Radiation Oncology

## 2013-05-19 DIAGNOSIS — L538 Other specified erythematous conditions: Secondary | ICD-10-CM | POA: Insufficient documentation

## 2013-05-19 DIAGNOSIS — M545 Low back pain, unspecified: Secondary | ICD-10-CM | POA: Insufficient documentation

## 2013-05-19 DIAGNOSIS — L589 Radiodermatitis, unspecified: Secondary | ICD-10-CM | POA: Insufficient documentation

## 2013-05-19 DIAGNOSIS — R5381 Other malaise: Secondary | ICD-10-CM | POA: Insufficient documentation

## 2013-05-19 DIAGNOSIS — L819 Disorder of pigmentation, unspecified: Secondary | ICD-10-CM | POA: Diagnosis not present

## 2013-05-19 DIAGNOSIS — C771 Secondary and unspecified malignant neoplasm of intrathoracic lymph nodes: Secondary | ICD-10-CM | POA: Diagnosis not present

## 2013-05-19 DIAGNOSIS — L539 Erythematous condition, unspecified: Secondary | ICD-10-CM | POA: Diagnosis not present

## 2013-05-19 DIAGNOSIS — M79609 Pain in unspecified limb: Secondary | ICD-10-CM | POA: Insufficient documentation

## 2013-05-19 DIAGNOSIS — C50919 Malignant neoplasm of unspecified site of unspecified female breast: Secondary | ICD-10-CM | POA: Insufficient documentation

## 2013-05-19 DIAGNOSIS — Y842 Radiological procedure and radiotherapy as the cause of abnormal reaction of the patient, or of later complication, without mention of misadventure at the time of the procedure: Secondary | ICD-10-CM | POA: Insufficient documentation

## 2013-05-19 DIAGNOSIS — C50419 Malignant neoplasm of upper-outer quadrant of unspecified female breast: Secondary | ICD-10-CM | POA: Diagnosis not present

## 2013-05-19 DIAGNOSIS — Z510 Encounter for antineoplastic radiation therapy: Secondary | ICD-10-CM | POA: Diagnosis not present

## 2013-05-19 DIAGNOSIS — C50411 Malignant neoplasm of upper-outer quadrant of right female breast: Secondary | ICD-10-CM

## 2013-05-19 NOTE — Progress Notes (Signed)
Complex simulation/treatment planning note: The patient was taken to the CT simulator. She was placed on a custom breast board. I marked her right chest wall field borders with radiopaque wires in addition to her right mastectomy scar. She was then scanned. An isocenter was chosen. She is up to medial and lateral right chest wall tangents and 2 multileaf collimators were designed. She was then set up to LAO to the right supraclavicular region and another multileaf collimator was designed. Lastly, she was set up to AP a right axillary field and a separate multileaf collimators was designed for a total for complex treatment devices. I prescribing 5040 cGy 28 sessions to all fields. The right supraclavicular dose we prescribed at 3 cm depth. She'll have construction of 1.0 cm custom bolus to be applied to her right chest wall on the first day of her treatment to be used every other day during her right chest wall tangents. This be followed by electron beam boost to her right chest wall mastectomy scar for a further 1000 cGy in 5 sessions.

## 2013-05-21 DIAGNOSIS — M79609 Pain in unspecified limb: Secondary | ICD-10-CM | POA: Diagnosis not present

## 2013-05-21 DIAGNOSIS — C771 Secondary and unspecified malignant neoplasm of intrathoracic lymph nodes: Secondary | ICD-10-CM | POA: Diagnosis not present

## 2013-05-21 DIAGNOSIS — C50919 Malignant neoplasm of unspecified site of unspecified female breast: Secondary | ICD-10-CM | POA: Diagnosis not present

## 2013-05-21 DIAGNOSIS — Z510 Encounter for antineoplastic radiation therapy: Secondary | ICD-10-CM | POA: Diagnosis not present

## 2013-05-21 DIAGNOSIS — L819 Disorder of pigmentation, unspecified: Secondary | ICD-10-CM | POA: Diagnosis not present

## 2013-05-21 DIAGNOSIS — M545 Low back pain: Secondary | ICD-10-CM | POA: Diagnosis not present

## 2013-05-21 DIAGNOSIS — C50419 Malignant neoplasm of upper-outer quadrant of unspecified female breast: Secondary | ICD-10-CM | POA: Diagnosis not present

## 2013-05-22 ENCOUNTER — Ambulatory Visit: Payer: Medicare Other | Admitting: Pulmonary Disease

## 2013-05-23 ENCOUNTER — Ambulatory Visit: Payer: Medicare Other | Attending: General Surgery | Admitting: Physical Therapy

## 2013-05-23 DIAGNOSIS — IMO0001 Reserved for inherently not codable concepts without codable children: Secondary | ICD-10-CM | POA: Diagnosis not present

## 2013-05-23 DIAGNOSIS — Z7902 Long term (current) use of antithrombotics/antiplatelets: Secondary | ICD-10-CM | POA: Insufficient documentation

## 2013-05-23 DIAGNOSIS — E785 Hyperlipidemia, unspecified: Secondary | ICD-10-CM | POA: Insufficient documentation

## 2013-05-23 DIAGNOSIS — Z8673 Personal history of transient ischemic attack (TIA), and cerebral infarction without residual deficits: Secondary | ICD-10-CM | POA: Insufficient documentation

## 2013-05-23 DIAGNOSIS — I10 Essential (primary) hypertension: Secondary | ICD-10-CM | POA: Diagnosis not present

## 2013-05-23 DIAGNOSIS — C50919 Malignant neoplasm of unspecified site of unspecified female breast: Secondary | ICD-10-CM | POA: Insufficient documentation

## 2013-05-23 DIAGNOSIS — E039 Hypothyroidism, unspecified: Secondary | ICD-10-CM | POA: Diagnosis not present

## 2013-05-23 DIAGNOSIS — M24519 Contracture, unspecified shoulder: Secondary | ICD-10-CM | POA: Insufficient documentation

## 2013-05-26 ENCOUNTER — Other Ambulatory Visit: Payer: Self-pay | Admitting: Pulmonary Disease

## 2013-05-26 ENCOUNTER — Ambulatory Visit
Admission: RE | Admit: 2013-05-26 | Discharge: 2013-05-26 | Disposition: A | Discharge status: Home / Self Care | Payer: Medicare Other | Source: Ambulatory Visit | Attending: Radiation Oncology | Admitting: Radiation Oncology

## 2013-05-26 DIAGNOSIS — M79609 Pain in unspecified limb: Secondary | ICD-10-CM | POA: Diagnosis not present

## 2013-05-26 DIAGNOSIS — L819 Disorder of pigmentation, unspecified: Secondary | ICD-10-CM | POA: Diagnosis not present

## 2013-05-26 DIAGNOSIS — C50919 Malignant neoplasm of unspecified site of unspecified female breast: Secondary | ICD-10-CM | POA: Diagnosis not present

## 2013-05-26 DIAGNOSIS — Z510 Encounter for antineoplastic radiation therapy: Secondary | ICD-10-CM | POA: Diagnosis not present

## 2013-05-26 DIAGNOSIS — C50419 Malignant neoplasm of upper-outer quadrant of unspecified female breast: Secondary | ICD-10-CM | POA: Diagnosis not present

## 2013-05-26 DIAGNOSIS — C771 Secondary and unspecified malignant neoplasm of intrathoracic lymph nodes: Secondary | ICD-10-CM | POA: Diagnosis not present

## 2013-05-26 DIAGNOSIS — M545 Low back pain: Secondary | ICD-10-CM | POA: Diagnosis not present

## 2013-05-26 DIAGNOSIS — C50411 Malignant neoplasm of upper-outer quadrant of right female breast: Secondary | ICD-10-CM

## 2013-05-26 NOTE — Progress Notes (Signed)
Simulation verification note: The patient was taken to the linear accelerator. Her isocenter is in good position and the multileaf collimators for her to right chest wall, right supraclavicular, axillary fields are all in good position.

## 2013-05-27 ENCOUNTER — Ambulatory Visit
Admission: RE | Admit: 2013-05-27 | Discharge: 2013-05-27 | Disposition: A | Discharge status: Home / Self Care | Payer: Medicare Other | Source: Ambulatory Visit | Attending: Radiation Oncology | Admitting: Radiation Oncology

## 2013-05-27 ENCOUNTER — Ambulatory Visit: Payer: Medicare Other

## 2013-05-27 DIAGNOSIS — E785 Hyperlipidemia, unspecified: Secondary | ICD-10-CM | POA: Diagnosis not present

## 2013-05-27 DIAGNOSIS — M79609 Pain in unspecified limb: Secondary | ICD-10-CM | POA: Diagnosis not present

## 2013-05-27 DIAGNOSIS — C771 Secondary and unspecified malignant neoplasm of intrathoracic lymph nodes: Secondary | ICD-10-CM | POA: Diagnosis not present

## 2013-05-27 DIAGNOSIS — IMO0001 Reserved for inherently not codable concepts without codable children: Secondary | ICD-10-CM | POA: Diagnosis not present

## 2013-05-27 DIAGNOSIS — M24519 Contracture, unspecified shoulder: Secondary | ICD-10-CM | POA: Diagnosis not present

## 2013-05-27 DIAGNOSIS — I10 Essential (primary) hypertension: Secondary | ICD-10-CM | POA: Diagnosis not present

## 2013-05-27 DIAGNOSIS — C50919 Malignant neoplasm of unspecified site of unspecified female breast: Secondary | ICD-10-CM | POA: Diagnosis not present

## 2013-05-27 DIAGNOSIS — L819 Disorder of pigmentation, unspecified: Secondary | ICD-10-CM | POA: Diagnosis not present

## 2013-05-27 DIAGNOSIS — M545 Low back pain: Secondary | ICD-10-CM | POA: Diagnosis not present

## 2013-05-27 DIAGNOSIS — Z510 Encounter for antineoplastic radiation therapy: Secondary | ICD-10-CM | POA: Diagnosis not present

## 2013-05-27 DIAGNOSIS — C50419 Malignant neoplasm of upper-outer quadrant of unspecified female breast: Secondary | ICD-10-CM | POA: Diagnosis not present

## 2013-05-27 DIAGNOSIS — E039 Hypothyroidism, unspecified: Secondary | ICD-10-CM | POA: Diagnosis not present

## 2013-05-28 ENCOUNTER — Ambulatory Visit
Admission: RE | Admit: 2013-05-28 | Discharge: 2013-05-28 | Disposition: A | Discharge status: Home / Self Care | Payer: Medicare Other | Source: Ambulatory Visit | Attending: Radiation Oncology | Admitting: Radiation Oncology

## 2013-05-28 DIAGNOSIS — Z510 Encounter for antineoplastic radiation therapy: Secondary | ICD-10-CM | POA: Diagnosis not present

## 2013-05-28 DIAGNOSIS — C50411 Malignant neoplasm of upper-outer quadrant of right female breast: Secondary | ICD-10-CM

## 2013-05-28 DIAGNOSIS — M545 Low back pain: Secondary | ICD-10-CM | POA: Diagnosis not present

## 2013-05-28 DIAGNOSIS — M79609 Pain in unspecified limb: Secondary | ICD-10-CM | POA: Diagnosis not present

## 2013-05-28 DIAGNOSIS — C50919 Malignant neoplasm of unspecified site of unspecified female breast: Secondary | ICD-10-CM | POA: Diagnosis not present

## 2013-05-28 DIAGNOSIS — C771 Secondary and unspecified malignant neoplasm of intrathoracic lymph nodes: Secondary | ICD-10-CM | POA: Diagnosis not present

## 2013-05-28 DIAGNOSIS — L819 Disorder of pigmentation, unspecified: Secondary | ICD-10-CM | POA: Diagnosis not present

## 2013-05-28 MED ORDER — ALRA NON-METALLIC DEODORANT (RAD-ONC)
1.0000 "application " | Freq: Once | TOPICAL | Status: AC
  Administered 2013-05-28: 1 via TOPICAL

## 2013-05-28 MED ORDER — RADIAPLEXRX EX GEL
Freq: Once | CUTANEOUS | Status: AC
  Administered 2013-05-28: 15:00:00 via TOPICAL

## 2013-05-28 NOTE — Progress Notes (Addendum)
Post sim ed completed w/pt. Gave pt "Radiation and You" booklet w/all pertinent information marked and discussed, re: fatigue, skin irritation/care, nutrition, pain. Gave pt Radiaplex, Alra w/instructions for proper use. All questions answered.

## 2013-05-29 ENCOUNTER — Ambulatory Visit
Admission: RE | Admit: 2013-05-29 | Discharge: 2013-05-29 | Disposition: A | Discharge status: Home / Self Care | Payer: Medicare Other | Source: Ambulatory Visit | Attending: Radiation Oncology | Admitting: Radiation Oncology

## 2013-05-29 DIAGNOSIS — C50919 Malignant neoplasm of unspecified site of unspecified female breast: Secondary | ICD-10-CM | POA: Diagnosis not present

## 2013-05-29 DIAGNOSIS — M545 Low back pain: Secondary | ICD-10-CM | POA: Diagnosis not present

## 2013-05-29 DIAGNOSIS — L819 Disorder of pigmentation, unspecified: Secondary | ICD-10-CM | POA: Diagnosis not present

## 2013-05-29 DIAGNOSIS — C771 Secondary and unspecified malignant neoplasm of intrathoracic lymph nodes: Secondary | ICD-10-CM | POA: Diagnosis not present

## 2013-05-29 DIAGNOSIS — M79609 Pain in unspecified limb: Secondary | ICD-10-CM | POA: Diagnosis not present

## 2013-05-29 DIAGNOSIS — Z510 Encounter for antineoplastic radiation therapy: Secondary | ICD-10-CM | POA: Diagnosis not present

## 2013-05-30 ENCOUNTER — Ambulatory Visit
Admission: RE | Admit: 2013-05-30 | Discharge: 2013-05-30 | Disposition: A | Discharge status: Home / Self Care | Payer: Medicare Other | Source: Ambulatory Visit | Attending: Radiation Oncology | Admitting: Radiation Oncology

## 2013-05-30 DIAGNOSIS — L819 Disorder of pigmentation, unspecified: Secondary | ICD-10-CM | POA: Diagnosis not present

## 2013-05-30 DIAGNOSIS — M79609 Pain in unspecified limb: Secondary | ICD-10-CM | POA: Diagnosis not present

## 2013-05-30 DIAGNOSIS — Z510 Encounter for antineoplastic radiation therapy: Secondary | ICD-10-CM | POA: Diagnosis not present

## 2013-05-30 DIAGNOSIS — C771 Secondary and unspecified malignant neoplasm of intrathoracic lymph nodes: Secondary | ICD-10-CM | POA: Diagnosis not present

## 2013-05-30 DIAGNOSIS — C50919 Malignant neoplasm of unspecified site of unspecified female breast: Secondary | ICD-10-CM | POA: Diagnosis not present

## 2013-05-30 DIAGNOSIS — M545 Low back pain: Secondary | ICD-10-CM | POA: Diagnosis not present

## 2013-06-02 ENCOUNTER — Encounter: Payer: Self-pay | Admitting: Radiation Oncology

## 2013-06-02 ENCOUNTER — Ambulatory Visit
Admission: RE | Admit: 2013-06-02 | Discharge: 2013-06-02 | Disposition: A | Discharge status: Home / Self Care | Payer: Medicare Other | Source: Ambulatory Visit | Attending: Radiation Oncology | Admitting: Radiation Oncology

## 2013-06-02 VITALS — BP 146/83 | HR 79 | Temp 97.4°F | Resp 20 | Wt 153.0 lb

## 2013-06-02 DIAGNOSIS — M79609 Pain in unspecified limb: Secondary | ICD-10-CM | POA: Diagnosis not present

## 2013-06-02 DIAGNOSIS — M545 Low back pain: Secondary | ICD-10-CM | POA: Diagnosis not present

## 2013-06-02 DIAGNOSIS — Z510 Encounter for antineoplastic radiation therapy: Secondary | ICD-10-CM | POA: Diagnosis not present

## 2013-06-02 DIAGNOSIS — L819 Disorder of pigmentation, unspecified: Secondary | ICD-10-CM | POA: Diagnosis not present

## 2013-06-02 DIAGNOSIS — C50919 Malignant neoplasm of unspecified site of unspecified female breast: Secondary | ICD-10-CM | POA: Diagnosis not present

## 2013-06-02 DIAGNOSIS — C771 Secondary and unspecified malignant neoplasm of intrathoracic lymph nodes: Secondary | ICD-10-CM | POA: Diagnosis not present

## 2013-06-02 DIAGNOSIS — C50411 Malignant neoplasm of upper-outer quadrant of right female breast: Secondary | ICD-10-CM

## 2013-06-02 NOTE — Progress Notes (Signed)
Weekly Management Note:  Site: Right chest wall/regional lymph nodes Current Dose:  900  cGy Projected Dose: 5040  CGy followed right chest wall mastectomy scar boost  Narrative: The patient is seen today for routine under treatment assessment. CBCT/MVCT images/port films were reviewed. The chart was reviewed.   She has Radioplex gel to use when necessary. She is without complaints today.  Physical Examination:  Filed Vitals:   06/02/13 1551  BP: 146/83  Pulse: 79  Temp: 97.4 F (36.3 C)  Resp: 20  .  Weight: 153 lb (69.4 kg). There may be slight hyperpigmentation the skin with no areas of desquamation.  Impression: Tolerating radiation therapy well.  Plan: Continue radiation therapy as planned.

## 2013-06-02 NOTE — Progress Notes (Signed)
Pt denies pain, fatigue, loss of appetite. She is applying Radiaplex to right chest wall.

## 2013-06-03 ENCOUNTER — Ambulatory Visit
Admission: RE | Admit: 2013-06-03 | Discharge: 2013-06-03 | Disposition: A | Discharge status: Home / Self Care | Payer: Medicare Other | Source: Ambulatory Visit | Attending: Radiation Oncology | Admitting: Radiation Oncology

## 2013-06-03 ENCOUNTER — Ambulatory Visit: Payer: Medicare Other

## 2013-06-03 DIAGNOSIS — L819 Disorder of pigmentation, unspecified: Secondary | ICD-10-CM | POA: Diagnosis not present

## 2013-06-03 DIAGNOSIS — C50419 Malignant neoplasm of upper-outer quadrant of unspecified female breast: Secondary | ICD-10-CM | POA: Diagnosis not present

## 2013-06-03 DIAGNOSIS — C771 Secondary and unspecified malignant neoplasm of intrathoracic lymph nodes: Secondary | ICD-10-CM | POA: Diagnosis not present

## 2013-06-03 DIAGNOSIS — C50919 Malignant neoplasm of unspecified site of unspecified female breast: Secondary | ICD-10-CM | POA: Diagnosis not present

## 2013-06-03 DIAGNOSIS — Z510 Encounter for antineoplastic radiation therapy: Secondary | ICD-10-CM | POA: Diagnosis not present

## 2013-06-03 DIAGNOSIS — M79609 Pain in unspecified limb: Secondary | ICD-10-CM | POA: Diagnosis not present

## 2013-06-03 DIAGNOSIS — M545 Low back pain: Secondary | ICD-10-CM | POA: Diagnosis not present

## 2013-06-04 ENCOUNTER — Ambulatory Visit: Payer: Medicare Other | Admitting: Physical Therapy

## 2013-06-04 ENCOUNTER — Ambulatory Visit
Admission: RE | Admit: 2013-06-04 | Discharge: 2013-06-04 | Disposition: A | Discharge status: Home / Self Care | Payer: Medicare Other | Source: Ambulatory Visit | Attending: Radiation Oncology | Admitting: Radiation Oncology

## 2013-06-04 DIAGNOSIS — IMO0001 Reserved for inherently not codable concepts without codable children: Secondary | ICD-10-CM | POA: Diagnosis not present

## 2013-06-04 DIAGNOSIS — C50919 Malignant neoplasm of unspecified site of unspecified female breast: Secondary | ICD-10-CM | POA: Diagnosis not present

## 2013-06-04 DIAGNOSIS — Z510 Encounter for antineoplastic radiation therapy: Secondary | ICD-10-CM | POA: Diagnosis not present

## 2013-06-04 DIAGNOSIS — M79609 Pain in unspecified limb: Secondary | ICD-10-CM | POA: Diagnosis not present

## 2013-06-04 DIAGNOSIS — L819 Disorder of pigmentation, unspecified: Secondary | ICD-10-CM | POA: Diagnosis not present

## 2013-06-04 DIAGNOSIS — E785 Hyperlipidemia, unspecified: Secondary | ICD-10-CM | POA: Diagnosis not present

## 2013-06-04 DIAGNOSIS — M24519 Contracture, unspecified shoulder: Secondary | ICD-10-CM | POA: Diagnosis not present

## 2013-06-04 DIAGNOSIS — I10 Essential (primary) hypertension: Secondary | ICD-10-CM | POA: Diagnosis not present

## 2013-06-04 DIAGNOSIS — C771 Secondary and unspecified malignant neoplasm of intrathoracic lymph nodes: Secondary | ICD-10-CM | POA: Diagnosis not present

## 2013-06-04 DIAGNOSIS — E039 Hypothyroidism, unspecified: Secondary | ICD-10-CM | POA: Diagnosis not present

## 2013-06-04 DIAGNOSIS — M545 Low back pain: Secondary | ICD-10-CM | POA: Diagnosis not present

## 2013-06-05 ENCOUNTER — Ambulatory Visit (INDEPENDENT_AMBULATORY_CARE_PROVIDER_SITE_OTHER): Payer: Medicare Other

## 2013-06-05 ENCOUNTER — Ambulatory Visit
Admission: RE | Admit: 2013-06-05 | Discharge: 2013-06-05 | Disposition: A | Discharge status: Home / Self Care | Payer: Medicare Other | Source: Ambulatory Visit | Attending: Radiation Oncology | Admitting: Radiation Oncology

## 2013-06-05 DIAGNOSIS — Z23 Encounter for immunization: Secondary | ICD-10-CM

## 2013-06-05 DIAGNOSIS — C771 Secondary and unspecified malignant neoplasm of intrathoracic lymph nodes: Secondary | ICD-10-CM | POA: Diagnosis not present

## 2013-06-05 DIAGNOSIS — L819 Disorder of pigmentation, unspecified: Secondary | ICD-10-CM | POA: Diagnosis not present

## 2013-06-05 DIAGNOSIS — Z510 Encounter for antineoplastic radiation therapy: Secondary | ICD-10-CM | POA: Diagnosis not present

## 2013-06-05 DIAGNOSIS — M545 Low back pain: Secondary | ICD-10-CM | POA: Diagnosis not present

## 2013-06-05 DIAGNOSIS — C50919 Malignant neoplasm of unspecified site of unspecified female breast: Secondary | ICD-10-CM | POA: Diagnosis not present

## 2013-06-05 DIAGNOSIS — M79609 Pain in unspecified limb: Secondary | ICD-10-CM | POA: Diagnosis not present

## 2013-06-06 ENCOUNTER — Ambulatory Visit
Admission: RE | Admit: 2013-06-06 | Discharge: 2013-06-06 | Disposition: A | Discharge status: Home / Self Care | Payer: Medicare Other | Source: Ambulatory Visit | Attending: Radiation Oncology | Admitting: Radiation Oncology

## 2013-06-06 DIAGNOSIS — C771 Secondary and unspecified malignant neoplasm of intrathoracic lymph nodes: Secondary | ICD-10-CM | POA: Diagnosis not present

## 2013-06-06 DIAGNOSIS — Z510 Encounter for antineoplastic radiation therapy: Secondary | ICD-10-CM | POA: Diagnosis not present

## 2013-06-06 DIAGNOSIS — L819 Disorder of pigmentation, unspecified: Secondary | ICD-10-CM | POA: Diagnosis not present

## 2013-06-06 DIAGNOSIS — M79609 Pain in unspecified limb: Secondary | ICD-10-CM | POA: Diagnosis not present

## 2013-06-06 DIAGNOSIS — M545 Low back pain: Secondary | ICD-10-CM | POA: Diagnosis not present

## 2013-06-06 DIAGNOSIS — C50919 Malignant neoplasm of unspecified site of unspecified female breast: Secondary | ICD-10-CM | POA: Diagnosis not present

## 2013-06-09 ENCOUNTER — Ambulatory Visit: Admission: RE | Admit: 2013-06-09 | Payer: Medicare Other | Source: Ambulatory Visit

## 2013-06-09 ENCOUNTER — Ambulatory Visit: Payer: Medicare Other

## 2013-06-09 ENCOUNTER — Encounter: Payer: Self-pay | Admitting: Radiation Oncology

## 2013-06-09 ENCOUNTER — Ambulatory Visit
Admission: RE | Admit: 2013-06-09 | Discharge: 2013-06-09 | Disposition: A | Discharge status: Home / Self Care | Payer: Medicare Other | Source: Ambulatory Visit | Attending: Radiation Oncology | Admitting: Radiation Oncology

## 2013-06-09 VITALS — BP 172/81 | HR 76 | Temp 98.1°F | Resp 20 | Wt 148.4 lb

## 2013-06-09 DIAGNOSIS — C50411 Malignant neoplasm of upper-outer quadrant of right female breast: Secondary | ICD-10-CM

## 2013-06-09 NOTE — Progress Notes (Signed)
Weekly Management Note:  Site: Right chest wall/regional lymph nodes Current Dose:  1620  cGy Projected Dose: 5040  cGy followed by right chest wall boost  Narrative: The patient is seen today for routine under treatment assessment. CBCT/MVCT images/port films were reviewed. The chart was reviewed.   This past Friday after exercising she developed low back pain along the left SI joint. She has taken up to 2 Aleve a day with some improvement. Her pain did radiate down to her left thigh and she's had similar pain in the past for which she has seen Dr. Madelon Lips. She is able to lie flat on the treatment table today for her treatment.  Physical Examination:  Filed Vitals:   06/09/13 1500  BP: 172/81  Pulse: 76  Temp: 98.1 F (36.7 C)  Resp: 20  .  Weight: 148 lb 6.4 oz (67.314 kg). No significant skin changes along the right chest wall/regional lymph nodes. There is discomfort described along the left SI joint. Motor and sensory examinations of the lower extremities are within normal limits.  Impression: Tolerating radiation therapy well, however, we are unable to treat her since she cannot lie flat on the treatment table. I told she could take up to 3-4 Aleve a day. She is to contact Dr. Madelon Lips and also notify her physical therapist.  Plan: Continue radiation therapy as planned. We hope to resume her radiation therapy tomorrow.

## 2013-06-09 NOTE — Progress Notes (Signed)
Pt unable to receive radiation treatment today due to being in pain. She states she began having left lower back pain last Fri; she attributes her pain to rehab exercises. Pt states she has ongoing lower back pain since the first of this year. She has seen Dr Madelon Lips in the past. She takes Hydrocodone prn w/good relief, but she has not gotten relief today. Her last dose was at lunch time today. She states she is out of Hydrocodone. Pt states she can get comfortable lying on her back w/pillows, pain 3-4 in this position. Pt will request Hydrocodone refill today. Advised she try applying heat alternating w/cold.

## 2013-06-10 ENCOUNTER — Telehealth: Payer: Self-pay | Admitting: *Deleted

## 2013-06-10 ENCOUNTER — Ambulatory Visit: Payer: Medicare Other

## 2013-06-10 DIAGNOSIS — M5137 Other intervertebral disc degeneration, lumbosacral region: Secondary | ICD-10-CM | POA: Diagnosis not present

## 2013-06-10 NOTE — Telephone Encounter (Signed)
Received call from pt stating she went to Dr Madelon Lips this morning and is going to have MRI of her back on 06/17/13. She states he refilled Percocet for her pain. Pt is taking Percocet 1 tab prn w/fair to good relief. She states she may try taking 2 tabs. She states that when she lies on her back she has to have pillow under her low back for comfort, and is certain she cannot lie on radiation treatment table without a pillow under her back. She states she knows this affects her treatment.  Advised pt will discuss her concerns w/Dr Dayton Scrape and call her back.  Discussed w/Dr Dayton Scrape. Called pt and advised her, per Dr Dayton Scrape, to get pain under control through Dr Candise Bowens office. Dr Dayton Scrape placing pt on treatment break until her pain is managed. Suggested pt call and speak w/Dr Caffrey's nurse re: anti-inflammatory, alternate methods for pain control, changing MRI appointment to earlier date. Informed pt she is on treatment break until she can lie flat on treatment table comfortably.  Informed pt will call her later this week for update on her status. Pt verbalized understanding. Informed Amy RT, linac 2 of pt's break until further notice.

## 2013-06-11 ENCOUNTER — Ambulatory Visit: Payer: Medicare Other

## 2013-06-12 ENCOUNTER — Ambulatory Visit: Payer: Medicare Other

## 2013-06-12 NOTE — Telephone Encounter (Signed)
Received call from pt stating the MRI of her back is moved forward to this Sat, 06/14/13. She then has FU w/Dr Madelon Lips on 06/17/13. Pt states she will call after her appointment with Dr Madelon Lips with an update. She states she has increased her pain medication but has no better relief of her back pain. Will update linac #2.

## 2013-06-13 ENCOUNTER — Ambulatory Visit: Payer: Medicare Other

## 2013-06-14 DIAGNOSIS — M47817 Spondylosis without myelopathy or radiculopathy, lumbosacral region: Secondary | ICD-10-CM | POA: Diagnosis not present

## 2013-06-16 ENCOUNTER — Ambulatory Visit: Payer: Medicare Other

## 2013-06-17 ENCOUNTER — Telehealth: Payer: Self-pay | Admitting: Pulmonary Disease

## 2013-06-17 ENCOUNTER — Ambulatory Visit: Payer: Medicare Other

## 2013-06-17 DIAGNOSIS — M25559 Pain in unspecified hip: Secondary | ICD-10-CM | POA: Diagnosis not present

## 2013-06-17 NOTE — Telephone Encounter (Signed)
Called and spoke with pt and she stated that she has been doing radiation and has been in rehab for her arm where she had the mastectomy.  She has pulled something in her back and was seen by Dr. 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  Pt stated that she will need ok to do this since she is taking the plavix.  SN is aware and will send over the last ov note and the request was received from Dr. Candise Bowens office and this will be sent back as well.

## 2013-06-18 ENCOUNTER — Telehealth: Payer: Self-pay | Admitting: Pulmonary Disease

## 2013-06-18 ENCOUNTER — Ambulatory Visit: Payer: Medicare Other

## 2013-06-18 NOTE — Telephone Encounter (Signed)
Duplicate msg.

## 2013-06-18 NOTE — Telephone Encounter (Signed)
Called, spoke with pt -  Pt states Dr. Valentina Gu office needs clearance stating it is ok for pt to stop the plavix and asa for the injection.  Pt states Dr. Court Joy does these injections and will be out of the office all next week except for Monday.  Therefore, pt states they will need to receive this approval by today in order for her to have this done on Monday. Pt has stopped both medications as of yesterday.  Pt states we can call Cassie at [phone] with Dr. Caffrey's/Dr. Ravasio's office if we have any questions.  Dr. Kriste Basque, pls advise if this is ok.    Asked pt if she knew a fax # - pt stated it should be on the papers that were sent yesterday.

## 2013-06-18 NOTE — Telephone Encounter (Signed)
Called and spoke with pt and she is aware that per SN---ok to stay off of the aspirin and the plavix until after her procedure on Monday.  Pt is aware that i will call and speak with cassie to make her aware that we will fax over the information tomorrow.  cassie is aware and fax # is [phone].  cassie stated that they will still be able to do the injection on Monday.

## 2013-06-18 NOTE — Telephone Encounter (Signed)
SN has completed the forms that were requested and these have been faxed.

## 2013-06-18 NOTE — Telephone Encounter (Signed)
Patient calling again about the same.  Please call asap.

## 2013-06-19 ENCOUNTER — Encounter: Payer: Medicare Other | Admitting: Physical Therapy

## 2013-06-19 ENCOUNTER — Telehealth: Payer: Self-pay | Admitting: *Deleted

## 2013-06-19 ENCOUNTER — Ambulatory Visit: Payer: Medicare Other

## 2013-06-19 ENCOUNTER — Telehealth: Payer: Self-pay | Admitting: Pulmonary Disease

## 2013-06-19 NOTE — Telephone Encounter (Signed)
Spoke w/pt re: back pain. She states she spoke w/Dr Dayton Scrape yesterday. She will receive a cortisone injection on 06/23/13. She states she is hoping that will enable her to restart her radiation treatments soon. She states she will call this dept and update [place] with her condition next Monday after she gets the injection. Pt thanked this RN for this call.

## 2013-06-19 NOTE — Telephone Encounter (Signed)
Returning call.Debra Mcintyre ° °

## 2013-06-19 NOTE — Telephone Encounter (Signed)
Pt wanted to thank everyone involved in getting her clearance to get the shot in her back. She wanted to let us know that it means a lot to her to have it taken care of so fast. I advisd I will pass this on. Carron Curie, CMA

## 2013-06-19 NOTE — Telephone Encounter (Signed)
LMTCB x 1 

## 2013-06-20 ENCOUNTER — Ambulatory Visit: Payer: Medicare Other

## 2013-06-23 ENCOUNTER — Ambulatory Visit: Payer: Medicare Other

## 2013-06-23 ENCOUNTER — Ambulatory Visit: Admission: RE | Admit: 2013-06-23 | Payer: Medicare Other | Source: Ambulatory Visit | Admitting: Radiation Oncology

## 2013-06-23 DIAGNOSIS — M47817 Spondylosis without myelopathy or radiculopathy, lumbosacral region: Secondary | ICD-10-CM | POA: Diagnosis not present

## 2013-06-23 DIAGNOSIS — IMO0002 Reserved for concepts with insufficient information to code with codable children: Secondary | ICD-10-CM | POA: Diagnosis not present

## 2013-06-24 ENCOUNTER — Ambulatory Visit: Payer: Medicare Other

## 2013-06-24 ENCOUNTER — Telehealth: Payer: Self-pay | Admitting: *Deleted

## 2013-06-24 ENCOUNTER — Other Ambulatory Visit: Payer: Self-pay | Admitting: Pulmonary Disease

## 2013-06-24 NOTE — Telephone Encounter (Signed)
Called pt re: FU on her Cortisone injection. Pt states she received Cortisone injection yesterday, was told she may feel improvement in her pain in 24-48 hours or it may takeup to 5 days. Pt states she is moving more easily today w/less pain. She states she spoke to a RT today and will come in Thurs to try and have radiation treatment. Will route this information to Dr Dayton Scrape.

## 2013-06-25 ENCOUNTER — Ambulatory Visit: Payer: Medicare Other

## 2013-06-26 ENCOUNTER — Encounter: Payer: Self-pay | Admitting: Radiation Oncology

## 2013-06-26 ENCOUNTER — Ambulatory Visit: Admission: RE | Admit: 2013-06-26 | Payer: Medicare Other | Source: Ambulatory Visit

## 2013-06-26 NOTE — Progress Notes (Signed)
Chart note: Ms. Kolk was found have degenerative disc disease on MRI. She was given an epidural injection with only minimal improvement of her pain. She is not able to lie flat on the treatment table since her last treatment on 06/06/2013 (approximately 3 weeks ago). I plan to have her undergo repeat simulation on Monday if her back pain is not significantly better to the point that she can lie flat on the treatment table. We will get her in a good position with back and leg support to minimize her back discomfort. She understands that she'll need extra treatments to make up for the "biological decay" of her radiation therapy.

## 2013-06-27 ENCOUNTER — Ambulatory Visit: Payer: Medicare Other

## 2013-06-27 ENCOUNTER — Encounter: Payer: Medicare Other | Admitting: Physical Therapy

## 2013-06-30 ENCOUNTER — Ambulatory Visit
Admission: RE | Admit: 2013-06-30 | Discharge: 2013-06-30 | Disposition: A | Discharge status: Home / Self Care | Payer: Medicare Other | Source: Ambulatory Visit | Attending: Radiation Oncology | Admitting: Radiation Oncology

## 2013-06-30 ENCOUNTER — Ambulatory Visit: Payer: Medicare Other

## 2013-06-30 ENCOUNTER — Encounter: Payer: Self-pay | Admitting: Radiation Oncology

## 2013-06-30 ENCOUNTER — Other Ambulatory Visit: Payer: Self-pay | Admitting: Orthopedic Surgery

## 2013-06-30 DIAGNOSIS — M25559 Pain in unspecified hip: Secondary | ICD-10-CM | POA: Diagnosis not present

## 2013-06-30 DIAGNOSIS — M25552 Pain in left hip: Secondary | ICD-10-CM

## 2013-06-30 DIAGNOSIS — C50919 Malignant neoplasm of unspecified site of unspecified female breast: Secondary | ICD-10-CM | POA: Diagnosis not present

## 2013-06-30 DIAGNOSIS — Z510 Encounter for antineoplastic radiation therapy: Secondary | ICD-10-CM | POA: Diagnosis not present

## 2013-06-30 DIAGNOSIS — M79609 Pain in unspecified limb: Secondary | ICD-10-CM | POA: Diagnosis not present

## 2013-06-30 DIAGNOSIS — C50419 Malignant neoplasm of upper-outer quadrant of unspecified female breast: Secondary | ICD-10-CM | POA: Diagnosis not present

## 2013-06-30 DIAGNOSIS — L819 Disorder of pigmentation, unspecified: Secondary | ICD-10-CM | POA: Diagnosis not present

## 2013-06-30 DIAGNOSIS — M545 Low back pain: Secondary | ICD-10-CM | POA: Diagnosis not present

## 2013-06-30 DIAGNOSIS — C771 Secondary and unspecified malignant neoplasm of intrathoracic lymph nodes: Secondary | ICD-10-CM | POA: Diagnosis not present

## 2013-06-30 DIAGNOSIS — C50411 Malignant neoplasm of upper-outer quadrant of right female breast: Secondary | ICD-10-CM

## 2013-06-30 NOTE — Progress Notes (Signed)
See  simulation note from earlier today.

## 2013-06-30 NOTE — Progress Notes (Signed)
Complex simulation note: The patient continued to have low back pain. Therefore she was received with a pad under her lower back and her knees flexed. She was rescanned. I chose the same isocenter. She was then set up to medial and lateral right chest wall tangents and 2 complex multileaf collimators were designed. She was then set up to LAO to her right supraclavicular/axillary region with a separate multileaf collimator and lastly, she was set up PA to her right axilla with a separate multileaf collimator for a total for complex treatment devices. She'll continue with her current prescription. An isodose plan and dosimetry are requested.

## 2013-07-01 ENCOUNTER — Ambulatory Visit: Payer: Medicare Other

## 2013-07-02 ENCOUNTER — Ambulatory Visit: Admission: RE | Admit: 2013-07-02 | Payer: Medicare Other | Source: Ambulatory Visit

## 2013-07-02 ENCOUNTER — Ambulatory Visit
Admission: RE | Admit: 2013-07-02 | Discharge: 2013-07-02 | Disposition: A | Discharge status: Home / Self Care | Payer: Medicare Other | Source: Ambulatory Visit | Attending: Orthopedic Surgery | Admitting: Orthopedic Surgery

## 2013-07-02 DIAGNOSIS — M25552 Pain in left hip: Secondary | ICD-10-CM

## 2013-07-02 DIAGNOSIS — M25559 Pain in unspecified hip: Secondary | ICD-10-CM | POA: Diagnosis not present

## 2013-07-03 ENCOUNTER — Ambulatory Visit
Admission: RE | Admit: 2013-07-03 | Discharge: 2013-07-03 | Disposition: A | Discharge status: Home / Self Care | Payer: Medicare Other | Source: Ambulatory Visit | Attending: Radiation Oncology | Admitting: Radiation Oncology

## 2013-07-03 DIAGNOSIS — M545 Low back pain: Secondary | ICD-10-CM | POA: Diagnosis not present

## 2013-07-03 DIAGNOSIS — C771 Secondary and unspecified malignant neoplasm of intrathoracic lymph nodes: Secondary | ICD-10-CM | POA: Diagnosis not present

## 2013-07-03 DIAGNOSIS — IMO0002 Reserved for concepts with insufficient information to code with codable children: Secondary | ICD-10-CM | POA: Diagnosis not present

## 2013-07-03 DIAGNOSIS — C50419 Malignant neoplasm of upper-outer quadrant of unspecified female breast: Secondary | ICD-10-CM | POA: Diagnosis not present

## 2013-07-03 DIAGNOSIS — L819 Disorder of pigmentation, unspecified: Secondary | ICD-10-CM | POA: Diagnosis not present

## 2013-07-03 DIAGNOSIS — M79609 Pain in unspecified limb: Secondary | ICD-10-CM | POA: Diagnosis not present

## 2013-07-03 DIAGNOSIS — Z510 Encounter for antineoplastic radiation therapy: Secondary | ICD-10-CM | POA: Diagnosis not present

## 2013-07-03 DIAGNOSIS — C50919 Malignant neoplasm of unspecified site of unspecified female breast: Secondary | ICD-10-CM | POA: Diagnosis not present

## 2013-07-03 DIAGNOSIS — C50411 Malignant neoplasm of upper-outer quadrant of right female breast: Secondary | ICD-10-CM

## 2013-07-03 NOTE — Progress Notes (Signed)
Simulation verification note: The patient underwent simulation verification for her treatment to her right chest wall and regional lymph nodes. The medial left chest wall tangent DRR did not match  the electronic MLCs. The remainder of her ports to the supraclavicular field and chest wall appeared to contoured the intended treatment volume appropriately.

## 2013-07-04 ENCOUNTER — Ambulatory Visit: Payer: Medicare Other

## 2013-07-04 ENCOUNTER — Ambulatory Visit: Admission: RE | Admit: 2013-07-04 | Payer: Medicare Other | Source: Ambulatory Visit

## 2013-07-04 ENCOUNTER — Ambulatory Visit
Admission: RE | Admit: 2013-07-04 | Discharge: 2013-07-04 | Disposition: A | Discharge status: Home / Self Care | Payer: Medicare Other | Source: Ambulatory Visit | Attending: Radiation Oncology | Admitting: Radiation Oncology

## 2013-07-05 ENCOUNTER — Other Ambulatory Visit: Payer: Self-pay | Admitting: Pulmonary Disease

## 2013-07-06 ENCOUNTER — Ambulatory Visit: Payer: Medicare Other

## 2013-07-07 ENCOUNTER — Ambulatory Visit: Payer: Medicare Other | Admitting: Radiation Oncology

## 2013-07-07 ENCOUNTER — Ambulatory Visit
Admission: RE | Admit: 2013-07-07 | Discharge: 2013-07-07 | Disposition: A | Discharge status: Home / Self Care | Payer: Medicare Other | Source: Ambulatory Visit | Attending: Radiation Oncology | Admitting: Radiation Oncology

## 2013-07-07 ENCOUNTER — Ambulatory Visit: Payer: Medicare Other

## 2013-07-07 ENCOUNTER — Encounter: Payer: Self-pay | Admitting: Radiation Oncology

## 2013-07-07 VITALS — BP 140/75 | HR 74 | Temp 99.0°F | Resp 20 | Wt 141.7 lb

## 2013-07-07 DIAGNOSIS — Z510 Encounter for antineoplastic radiation therapy: Secondary | ICD-10-CM | POA: Diagnosis not present

## 2013-07-07 DIAGNOSIS — C771 Secondary and unspecified malignant neoplasm of intrathoracic lymph nodes: Secondary | ICD-10-CM | POA: Diagnosis not present

## 2013-07-07 DIAGNOSIS — M545 Low back pain: Secondary | ICD-10-CM | POA: Diagnosis not present

## 2013-07-07 DIAGNOSIS — C50919 Malignant neoplasm of unspecified site of unspecified female breast: Secondary | ICD-10-CM | POA: Diagnosis not present

## 2013-07-07 DIAGNOSIS — L819 Disorder of pigmentation, unspecified: Secondary | ICD-10-CM | POA: Diagnosis not present

## 2013-07-07 DIAGNOSIS — M79609 Pain in unspecified limb: Secondary | ICD-10-CM | POA: Diagnosis not present

## 2013-07-07 DIAGNOSIS — C50411 Malignant neoplasm of upper-outer quadrant of right female breast: Secondary | ICD-10-CM

## 2013-07-07 NOTE — Progress Notes (Signed)
Simulation verification note: The patient underwent simulation verification for her new right chest wall, right supraclavicular, and PA right axillary fields. Her isocenter is in good position and the multileaf collimators contoured the treatment volume appropriately.

## 2013-07-07 NOTE — Progress Notes (Signed)
Pt states the new support for her back that is used during her treatment is very helpful. She has some back pain today but took Oxycodone 2 tabs prior to treatment. She states she takes approximately 4-6 pain tabs daily. She denies issues with her right chest wall treatment area.

## 2013-07-07 NOTE — Progress Notes (Signed)
Weekly Management Note:  Site: Right chest wall/regional lymph nodes Current Dose:  1620  cGy Projected Dose: 5040  cGy by right mastectomy scar boost  Narrative: The patient is seen today for routine under treatment assessment. CBCT/MVCT images/port films were reviewed. The chart was reviewed.   The patient was replanned because of difficulty with both arms extended. Her treatment is now reproducible. Her back pain is improving. She is scheduled for sounds like an epidural injection next week.  Physical Examination:  Filed Vitals:   07/07/13 1518  BP: 140/75  Pulse: 74  Temp: 99 F (37.2 C)  Resp: 20  .  Weight: 141 lb 11.2 oz (64.275 kg). There is mild hyperpigmentation the skin along the right chest wall. No areas of desquamation.  Impression: Tolerating radiation therapy well.  Plan: Continue radiation therapy as planned.

## 2013-07-08 ENCOUNTER — Ambulatory Visit
Admission: RE | Admit: 2013-07-08 | Discharge: 2013-07-08 | Disposition: A | Discharge status: Home / Self Care | Payer: Medicare Other | Source: Ambulatory Visit | Attending: Radiation Oncology | Admitting: Radiation Oncology

## 2013-07-08 DIAGNOSIS — C50419 Malignant neoplasm of upper-outer quadrant of unspecified female breast: Secondary | ICD-10-CM | POA: Diagnosis not present

## 2013-07-08 DIAGNOSIS — Z510 Encounter for antineoplastic radiation therapy: Secondary | ICD-10-CM | POA: Diagnosis not present

## 2013-07-08 DIAGNOSIS — M79609 Pain in unspecified limb: Secondary | ICD-10-CM | POA: Diagnosis not present

## 2013-07-08 DIAGNOSIS — C50919 Malignant neoplasm of unspecified site of unspecified female breast: Secondary | ICD-10-CM | POA: Diagnosis not present

## 2013-07-08 DIAGNOSIS — L819 Disorder of pigmentation, unspecified: Secondary | ICD-10-CM | POA: Diagnosis not present

## 2013-07-08 DIAGNOSIS — M545 Low back pain: Secondary | ICD-10-CM | POA: Diagnosis not present

## 2013-07-08 DIAGNOSIS — C771 Secondary and unspecified malignant neoplasm of intrathoracic lymph nodes: Secondary | ICD-10-CM | POA: Diagnosis not present

## 2013-07-09 ENCOUNTER — Ambulatory Visit
Admission: RE | Admit: 2013-07-09 | Discharge: 2013-07-09 | Disposition: A | Discharge status: Home / Self Care | Payer: Medicare Other | Source: Ambulatory Visit | Attending: Radiation Oncology | Admitting: Radiation Oncology

## 2013-07-09 DIAGNOSIS — C50919 Malignant neoplasm of unspecified site of unspecified female breast: Secondary | ICD-10-CM | POA: Diagnosis not present

## 2013-07-09 DIAGNOSIS — M545 Low back pain: Secondary | ICD-10-CM | POA: Diagnosis not present

## 2013-07-09 DIAGNOSIS — M79609 Pain in unspecified limb: Secondary | ICD-10-CM | POA: Diagnosis not present

## 2013-07-09 DIAGNOSIS — Z510 Encounter for antineoplastic radiation therapy: Secondary | ICD-10-CM | POA: Diagnosis not present

## 2013-07-09 DIAGNOSIS — L819 Disorder of pigmentation, unspecified: Secondary | ICD-10-CM | POA: Diagnosis not present

## 2013-07-09 DIAGNOSIS — C771 Secondary and unspecified malignant neoplasm of intrathoracic lymph nodes: Secondary | ICD-10-CM | POA: Diagnosis not present

## 2013-07-10 ENCOUNTER — Ambulatory Visit: Payer: Medicare Other

## 2013-07-11 ENCOUNTER — Ambulatory Visit: Payer: Medicare Other

## 2013-07-14 ENCOUNTER — Ambulatory Visit
Admission: RE | Admit: 2013-07-14 | Discharge: 2013-07-14 | Disposition: A | Discharge status: Home / Self Care | Payer: Medicare Other | Source: Ambulatory Visit | Attending: Radiation Oncology | Admitting: Radiation Oncology

## 2013-07-14 ENCOUNTER — Ambulatory Visit: Payer: Medicare Other

## 2013-07-14 ENCOUNTER — Encounter: Payer: Self-pay | Admitting: Radiation Oncology

## 2013-07-14 VITALS — BP 89/59 | HR 88 | Temp 98.5°F | Resp 20 | Wt 142.7 lb

## 2013-07-14 DIAGNOSIS — M545 Low back pain: Secondary | ICD-10-CM | POA: Diagnosis not present

## 2013-07-14 DIAGNOSIS — L819 Disorder of pigmentation, unspecified: Secondary | ICD-10-CM | POA: Diagnosis not present

## 2013-07-14 DIAGNOSIS — C50411 Malignant neoplasm of upper-outer quadrant of right female breast: Secondary | ICD-10-CM

## 2013-07-14 DIAGNOSIS — Z510 Encounter for antineoplastic radiation therapy: Secondary | ICD-10-CM | POA: Diagnosis not present

## 2013-07-14 DIAGNOSIS — C50919 Malignant neoplasm of unspecified site of unspecified female breast: Secondary | ICD-10-CM | POA: Diagnosis not present

## 2013-07-14 DIAGNOSIS — M79609 Pain in unspecified limb: Secondary | ICD-10-CM | POA: Diagnosis not present

## 2013-07-14 DIAGNOSIS — C771 Secondary and unspecified malignant neoplasm of intrathoracic lymph nodes: Secondary | ICD-10-CM | POA: Diagnosis not present

## 2013-07-14 NOTE — Progress Notes (Signed)
Weekly Management Note:  Site: Right chest wall/Regional lymph nodes Current Dose:  2340  cGy Projected Dose: 5040  cGy  Narrative: The patient is seen today for routine under treatment assessment. CBCT/MVCT images/port films were reviewed. The chart was reviewed.   She is without new complaints today. She states that her back pain is better. She is scheduled for a back injection in one week. She uses Radioplex gel.  Physical Examination:  Filed Vitals:   07/14/13 1529  BP: 89/59  Pulse: 88  Temp: 98.5 F (36.9 C)  Resp: 20  .  Weight: 142 lb 11.2 oz (64.728 kg). There is mild erythema/hyperpigmentation the skin along the right chest wall/axilla. No areas of desquamation.  Impression: Tolerating radiation therapy well.  Plan: Continue radiation therapy as planned.

## 2013-07-14 NOTE — Progress Notes (Signed)
Pt denies pain, states he back pain is much improved.  She is applying Radiaplex to right chest wall for pinkness.

## 2013-07-15 ENCOUNTER — Ambulatory Visit
Admission: RE | Admit: 2013-07-15 | Discharge: 2013-07-15 | Disposition: A | Discharge status: Home / Self Care | Payer: Medicare Other | Source: Ambulatory Visit | Attending: Radiation Oncology | Admitting: Radiation Oncology

## 2013-07-15 ENCOUNTER — Ambulatory Visit: Payer: Medicare Other

## 2013-07-15 DIAGNOSIS — C771 Secondary and unspecified malignant neoplasm of intrathoracic lymph nodes: Secondary | ICD-10-CM | POA: Diagnosis not present

## 2013-07-15 DIAGNOSIS — M79609 Pain in unspecified limb: Secondary | ICD-10-CM | POA: Diagnosis not present

## 2013-07-15 DIAGNOSIS — C50919 Malignant neoplasm of unspecified site of unspecified female breast: Secondary | ICD-10-CM | POA: Diagnosis not present

## 2013-07-15 DIAGNOSIS — M545 Low back pain: Secondary | ICD-10-CM | POA: Diagnosis not present

## 2013-07-15 DIAGNOSIS — L819 Disorder of pigmentation, unspecified: Secondary | ICD-10-CM | POA: Diagnosis not present

## 2013-07-15 DIAGNOSIS — Z510 Encounter for antineoplastic radiation therapy: Secondary | ICD-10-CM | POA: Diagnosis not present

## 2013-07-16 ENCOUNTER — Telehealth: Payer: Self-pay | Admitting: Pulmonary Disease

## 2013-07-16 ENCOUNTER — Ambulatory Visit
Admission: RE | Admit: 2013-07-16 | Discharge: 2013-07-16 | Disposition: A | Discharge status: Home / Self Care | Payer: Medicare Other | Source: Ambulatory Visit | Attending: Radiation Oncology | Admitting: Radiation Oncology

## 2013-07-16 DIAGNOSIS — M545 Low back pain: Secondary | ICD-10-CM | POA: Diagnosis not present

## 2013-07-16 DIAGNOSIS — M79609 Pain in unspecified limb: Secondary | ICD-10-CM | POA: Diagnosis not present

## 2013-07-16 DIAGNOSIS — C50919 Malignant neoplasm of unspecified site of unspecified female breast: Secondary | ICD-10-CM | POA: Diagnosis not present

## 2013-07-16 DIAGNOSIS — Z510 Encounter for antineoplastic radiation therapy: Secondary | ICD-10-CM | POA: Diagnosis not present

## 2013-07-16 DIAGNOSIS — L819 Disorder of pigmentation, unspecified: Secondary | ICD-10-CM | POA: Diagnosis not present

## 2013-07-16 DIAGNOSIS — C771 Secondary and unspecified malignant neoplasm of intrathoracic lymph nodes: Secondary | ICD-10-CM | POA: Diagnosis not present

## 2013-07-16 NOTE — Telephone Encounter (Signed)
Per SN---  Percocet could also lower her BP if it helped her pain Pain can raise the BP recs are to check the BP in the morning and hold med if BP is less than 100 systolic. Otherwise continue BP meds the same for now.  thanks

## 2013-07-16 NOTE — Telephone Encounter (Signed)
I called and spoke with Debra Mcintyre. She reports on Monday when she saw her oncologists her BP was 89/50. This was after she had taken her morning medications. She takes lisiopril 20 mg QD and metoprolol 100 mg daily. On Tuesday morning before taking her meds her BP was 144/87. She is having back pain and takes percocets but not sure if this could have affected her BP. Please advise SN thanks  No Known Allergies

## 2013-07-16 NOTE — Telephone Encounter (Signed)
I called and made pt aware of recs. Nothing further needed 

## 2013-07-17 ENCOUNTER — Ambulatory Visit: Payer: Medicare Other

## 2013-07-17 ENCOUNTER — Ambulatory Visit
Admission: RE | Admit: 2013-07-17 | Discharge: 2013-07-17 | Disposition: A | Discharge status: Home / Self Care | Payer: Medicare Other | Source: Ambulatory Visit | Attending: Radiation Oncology | Admitting: Radiation Oncology

## 2013-07-17 DIAGNOSIS — M79609 Pain in unspecified limb: Secondary | ICD-10-CM | POA: Diagnosis not present

## 2013-07-17 DIAGNOSIS — L819 Disorder of pigmentation, unspecified: Secondary | ICD-10-CM | POA: Diagnosis not present

## 2013-07-17 DIAGNOSIS — C50919 Malignant neoplasm of unspecified site of unspecified female breast: Secondary | ICD-10-CM | POA: Diagnosis not present

## 2013-07-17 DIAGNOSIS — M545 Low back pain: Secondary | ICD-10-CM | POA: Diagnosis not present

## 2013-07-17 DIAGNOSIS — C50419 Malignant neoplasm of upper-outer quadrant of unspecified female breast: Secondary | ICD-10-CM | POA: Diagnosis not present

## 2013-07-17 DIAGNOSIS — Z510 Encounter for antineoplastic radiation therapy: Secondary | ICD-10-CM | POA: Diagnosis not present

## 2013-07-17 DIAGNOSIS — C771 Secondary and unspecified malignant neoplasm of intrathoracic lymph nodes: Secondary | ICD-10-CM | POA: Diagnosis not present

## 2013-07-18 ENCOUNTER — Ambulatory Visit
Admission: RE | Admit: 2013-07-18 | Discharge: 2013-07-18 | Disposition: A | Discharge status: Home / Self Care | Payer: Medicare Other | Source: Ambulatory Visit | Attending: Radiation Oncology | Admitting: Radiation Oncology

## 2013-07-18 DIAGNOSIS — C771 Secondary and unspecified malignant neoplasm of intrathoracic lymph nodes: Secondary | ICD-10-CM | POA: Diagnosis not present

## 2013-07-18 DIAGNOSIS — M545 Low back pain: Secondary | ICD-10-CM | POA: Diagnosis not present

## 2013-07-18 DIAGNOSIS — M79609 Pain in unspecified limb: Secondary | ICD-10-CM | POA: Diagnosis not present

## 2013-07-18 DIAGNOSIS — C50411 Malignant neoplasm of upper-outer quadrant of right female breast: Secondary | ICD-10-CM

## 2013-07-18 DIAGNOSIS — L819 Disorder of pigmentation, unspecified: Secondary | ICD-10-CM | POA: Diagnosis not present

## 2013-07-18 DIAGNOSIS — C50919 Malignant neoplasm of unspecified site of unspecified female breast: Secondary | ICD-10-CM | POA: Diagnosis not present

## 2013-07-18 DIAGNOSIS — Z510 Encounter for antineoplastic radiation therapy: Secondary | ICD-10-CM | POA: Diagnosis not present

## 2013-07-18 MED ORDER — RADIAPLEXRX EX GEL
Freq: Once | CUTANEOUS | Status: AC
  Administered 2013-07-18: 14:00:00 via TOPICAL

## 2013-07-21 ENCOUNTER — Ambulatory Visit
Admission: RE | Admit: 2013-07-21 | Discharge: 2013-07-21 | Disposition: A | Discharge status: Home / Self Care | Payer: Medicare Other | Source: Ambulatory Visit | Attending: Radiation Oncology | Admitting: Radiation Oncology

## 2013-07-21 ENCOUNTER — Ambulatory Visit: Payer: Medicare Other

## 2013-07-21 ENCOUNTER — Ambulatory Visit: Payer: Medicare Other | Admitting: Radiation Oncology

## 2013-07-21 VITALS — BP 123/64 | HR 72 | Temp 98.0°F | Wt 142.5 lb

## 2013-07-21 DIAGNOSIS — C771 Secondary and unspecified malignant neoplasm of intrathoracic lymph nodes: Secondary | ICD-10-CM | POA: Diagnosis not present

## 2013-07-21 DIAGNOSIS — M545 Low back pain: Secondary | ICD-10-CM | POA: Diagnosis not present

## 2013-07-21 DIAGNOSIS — C50411 Malignant neoplasm of upper-outer quadrant of right female breast: Secondary | ICD-10-CM

## 2013-07-21 DIAGNOSIS — M79609 Pain in unspecified limb: Secondary | ICD-10-CM | POA: Diagnosis not present

## 2013-07-21 DIAGNOSIS — C50919 Malignant neoplasm of unspecified site of unspecified female breast: Secondary | ICD-10-CM | POA: Diagnosis not present

## 2013-07-21 DIAGNOSIS — IMO0002 Reserved for concepts with insufficient information to code with codable children: Secondary | ICD-10-CM | POA: Diagnosis not present

## 2013-07-21 DIAGNOSIS — L819 Disorder of pigmentation, unspecified: Secondary | ICD-10-CM | POA: Diagnosis not present

## 2013-07-21 DIAGNOSIS — Z510 Encounter for antineoplastic radiation therapy: Secondary | ICD-10-CM | POA: Diagnosis not present

## 2013-07-21 NOTE — Progress Notes (Signed)
Patient for weekly assessment of radiation to right breast.No chest wall pain but does have back pain which she is scheduled to have an injection/nerve block.continue application of radiaplex twice daily.

## 2013-07-21 NOTE — Progress Notes (Signed)
Weekly Management Note:  Site: Right chest wall/regional lymph nodes Current Dose:  3240  cGy Projected Dose: 5040  cGy followed by right chest wall boost  Narrative: The patient is seen today for routine under treatment assessment. CBCT/MVCT images/port films were reviewed. The chart was reviewed.   She is without new complaints today. She is scheduled for a spinal injection this week. She seen Dr. Ollen Bowl of the [facility].  Physical Examination:  Filed Vitals:   07/21/13 1235  BP: 123/64  Pulse: 72  Temp: 98 F (36.7 C)  .  Weight: 142 lb 8 oz (64.638 kg). There is slight hyperpigmentation the skin along her right chest wall/axilla. No areas of desquamation.  Impression: Tolerating radiation therapy well.  Plan: Continue radiation therapy as planned.

## 2013-07-22 ENCOUNTER — Ambulatory Visit: Payer: Medicare Other

## 2013-07-22 ENCOUNTER — Ambulatory Visit
Admission: RE | Admit: 2013-07-22 | Discharge: 2013-07-22 | Disposition: A | Discharge status: Home / Self Care | Payer: Medicare Other | Source: Ambulatory Visit | Attending: Radiation Oncology | Admitting: Radiation Oncology

## 2013-07-22 DIAGNOSIS — Z510 Encounter for antineoplastic radiation therapy: Secondary | ICD-10-CM | POA: Diagnosis not present

## 2013-07-22 DIAGNOSIS — L819 Disorder of pigmentation, unspecified: Secondary | ICD-10-CM | POA: Diagnosis not present

## 2013-07-22 DIAGNOSIS — C50919 Malignant neoplasm of unspecified site of unspecified female breast: Secondary | ICD-10-CM | POA: Diagnosis not present

## 2013-07-22 DIAGNOSIS — M545 Low back pain: Secondary | ICD-10-CM | POA: Diagnosis not present

## 2013-07-22 DIAGNOSIS — C771 Secondary and unspecified malignant neoplasm of intrathoracic lymph nodes: Secondary | ICD-10-CM | POA: Diagnosis not present

## 2013-07-22 DIAGNOSIS — M79609 Pain in unspecified limb: Secondary | ICD-10-CM | POA: Diagnosis not present

## 2013-07-23 ENCOUNTER — Ambulatory Visit: Payer: Medicare Other

## 2013-07-23 ENCOUNTER — Ambulatory Visit
Admission: RE | Admit: 2013-07-23 | Discharge: 2013-07-23 | Disposition: A | Discharge status: Home / Self Care | Payer: Medicare Other | Source: Ambulatory Visit | Attending: Radiation Oncology | Admitting: Radiation Oncology

## 2013-07-23 DIAGNOSIS — C50919 Malignant neoplasm of unspecified site of unspecified female breast: Secondary | ICD-10-CM | POA: Diagnosis not present

## 2013-07-23 DIAGNOSIS — Z510 Encounter for antineoplastic radiation therapy: Secondary | ICD-10-CM | POA: Diagnosis not present

## 2013-07-23 DIAGNOSIS — L819 Disorder of pigmentation, unspecified: Secondary | ICD-10-CM | POA: Diagnosis not present

## 2013-07-23 DIAGNOSIS — M79609 Pain in unspecified limb: Secondary | ICD-10-CM | POA: Diagnosis not present

## 2013-07-23 DIAGNOSIS — C771 Secondary and unspecified malignant neoplasm of intrathoracic lymph nodes: Secondary | ICD-10-CM | POA: Diagnosis not present

## 2013-07-23 DIAGNOSIS — M545 Low back pain: Secondary | ICD-10-CM | POA: Diagnosis not present

## 2013-07-24 ENCOUNTER — Ambulatory Visit (HOSPITAL_BASED_OUTPATIENT_CLINIC_OR_DEPARTMENT_OTHER): Payer: Medicare Other | Admitting: Oncology

## 2013-07-24 ENCOUNTER — Telehealth: Payer: Self-pay | Admitting: Oncology

## 2013-07-24 ENCOUNTER — Ambulatory Visit: Payer: Medicare Other

## 2013-07-24 ENCOUNTER — Other Ambulatory Visit (HOSPITAL_BASED_OUTPATIENT_CLINIC_OR_DEPARTMENT_OTHER): Payer: Medicare Other

## 2013-07-24 ENCOUNTER — Ambulatory Visit
Admission: RE | Admit: 2013-07-24 | Discharge: 2013-07-24 | Disposition: A | Discharge status: Home / Self Care | Payer: Medicare Other | Source: Ambulatory Visit | Attending: Radiation Oncology | Admitting: Radiation Oncology

## 2013-07-24 VITALS — BP 116/76 | HR 89 | Temp 97.9°F | Resp 18 | Ht 69.0 in | Wt 142.5 lb

## 2013-07-24 DIAGNOSIS — C773 Secondary and unspecified malignant neoplasm of axilla and upper limb lymph nodes: Secondary | ICD-10-CM

## 2013-07-24 DIAGNOSIS — C50411 Malignant neoplasm of upper-outer quadrant of right female breast: Secondary | ICD-10-CM

## 2013-07-24 DIAGNOSIS — C50119 Malignant neoplasm of central portion of unspecified female breast: Secondary | ICD-10-CM | POA: Diagnosis not present

## 2013-07-24 DIAGNOSIS — R5381 Other malaise: Secondary | ICD-10-CM

## 2013-07-24 DIAGNOSIS — C50919 Malignant neoplasm of unspecified site of unspecified female breast: Secondary | ICD-10-CM

## 2013-07-24 DIAGNOSIS — Z510 Encounter for antineoplastic radiation therapy: Secondary | ICD-10-CM | POA: Diagnosis not present

## 2013-07-24 DIAGNOSIS — Z171 Estrogen receptor negative status [ER-]: Secondary | ICD-10-CM | POA: Diagnosis not present

## 2013-07-24 DIAGNOSIS — M545 Low back pain: Secondary | ICD-10-CM | POA: Diagnosis not present

## 2013-07-24 DIAGNOSIS — M79609 Pain in unspecified limb: Secondary | ICD-10-CM | POA: Diagnosis not present

## 2013-07-24 DIAGNOSIS — C50419 Malignant neoplasm of upper-outer quadrant of unspecified female breast: Secondary | ICD-10-CM | POA: Diagnosis not present

## 2013-07-24 DIAGNOSIS — C771 Secondary and unspecified malignant neoplasm of intrathoracic lymph nodes: Secondary | ICD-10-CM | POA: Diagnosis not present

## 2013-07-24 DIAGNOSIS — L819 Disorder of pigmentation, unspecified: Secondary | ICD-10-CM | POA: Diagnosis not present

## 2013-07-24 LAB — CBC WITH DIFFERENTIAL/PLATELET
Basophils Absolute: 0 10*3/uL (ref 0.0–0.1)
EOS%: 1 % (ref 0.0–7.0)
HGB: 12.5 g/dL (ref 11.6–15.9)
MCH: 28.5 pg (ref 25.1–34.0)
MCV: 82.6 fL (ref 79.5–101.0)
MONO%: 16.1 % — ABNORMAL HIGH (ref 0.0–14.0)
RDW: 15.2 % — ABNORMAL HIGH (ref 11.2–14.5)

## 2013-07-24 NOTE — Progress Notes (Signed)
ID: Debra Mcintyre   DOB: Apr 28, 1941  MR#: [id number]  CSN#:[id number]  PCP: Debra Mcintyre GYN: Debra Mcintyre SU: Debra Mcintyre OTHER Mcintyre: Debra Mcintyre, Debra Mcintyre, Debra Mcintyre, Debra Mcintyre  HISTORY OF PRESENT ILLNESS: Debra Mcintyre had screening mammography at [facility] 04/10/2012 raising the question of some axillary lymph nodes on the right. Additional views of the right axilla and right axillary ultrasound performed 04/04/2012 showed 3 lymph nodes that appeared larger than prior. The largest measured 1.5 cm. 2 of them had cortical thickening. Close followup was suggested, and repeat right axillary ultrasound 06/25/2012 showed no significant change in the lymph nodes in question. Followup ultrasound in 3 months was recommended, but in the interim the patient saw her primary physician, Dr. Lorin Mcintyre and ADL, and he set her up for a chest CT on 07/08/2012, which showed a mild asymmetric density in the upper mid right breast. There was a prominent right axillary lymph node measuring 1.9 cm, with a few smaller adjacent nodes asymmetric with compared to the left side. Again, short interval followup was recommended, and on 10/07/2012 the patient had digital right mammography and ultrasonography, now at the breast Center. Dr. Judyann Mcintyre noted pleomorphic calcifications spanning an area of 6.3 cm without associated mass. Physical exam was unremarkable. Ultrasound showed an area of adenopathy in the right axilla measuring 1.6 cm, with a second lymph node with a thickened cortex measuring 1.2 cm. No suspicious mass was seen by ultrasound in the right breast.  Biopsy of the larger axillary lymph node on 10/07/2012 showed ([id number]) an invasive ductal carcinoma, triple negative, with an MIB-1 of 66%. Biopsy of the right breast the next day, [id number]) showed invasive ductal carcinoma, grade 3. Breast MRI 10/14/2012 showed a total area of irregular enhancement in the right breast measuring up to 12 cm. MRI guided  biopsy of an area in the upper inner quadrant of the right breast on 10/23/2012 showed ([id number]) ductal carcinoma in situ, with foci worrisome for invasion.  The patient's subsequent history is as detailed below  INTERVAL HISTORY: Debra Mcintyre returns today for followup of her locally advanced right breast cancer accompanied by her husband Debra Mcintyre. Since her last visit here she started radiation and she also started rehabilitation and it could be that she was a little bit too aggressive in the rehabilitation but at any rate she developed significant back pain which kept her from being able to lie down flat radiation/. Treatments had to be interrupted. She was evaluated by or so and more recently by Debra Mcintyre who recently and gave her an epidural and "that's turn things around".  REVIEW OF SYSTEMS: She is still not exercising regularly. She does have a membership at [employer]. She walks around the house. Her appetite is poor and her sense of taste continues to be altered. She has no unusual headaches, visual changes, dizziness, or gait imbalance. She denies cough phlegm production or pleurisy. There has been no chest pain or pressure. She suffers from constipation due to the Percocet she still takes occasionally for left hip pain. Otherwise today a detailed review of systems was noncontributory  PAST MEDICAL HISTORY: Past Medical History  Diagnosis Date  . Cerebrovascular disease, unspecified   . Cerebral aneurysm, nonruptured   . Pure hypercholesterolemia   . Hypothyroidism   . Colonic polyp   . History of chemotherapy   . TIA (transient ischemic attack)   . Hypertension     Does not see a cardiologist  . Neuropathy   .  Breast cancer 09/2012    right breast/right axillary lymph node t2,pn1, stage 11b, invasive ductal carcinma, grade 3, triple negative, with an mib-1 of 15%    PAST SURGICAL HISTORY: Past Surgical History  Procedure Laterality Date  . Left middle cerebral artery angioplasty   2004    by Debra Mcintyre had 2 follow up occurances  . Portacath placement N/A 11/05/2012    Procedure: INSERTION PORT-A-CATH;  Surgeon: Debra Mcintyre;  Location: [facility];  Service: General;  Laterality: N/A;  . Breast biopsy Right 10/23/2012  . Breast biopsy Right 10/08/2012  . Mastectomy w/ sentinel node biopsy Right 04/23/2013    Procedure: RIGHT TOTAL MASTECTOMY WITH SENTINEL LYMPH NODE BIOPSY;  Surgeon: Debra Mcintyre;  Location: [facility] OR;  Service: General;  Laterality: Right;    FAMILY HISTORY Family History  Problem Relation Age of Onset  . Lung cancer Mother 37  . Heart disease Father   . Colon cancer Brother 18  . Alcohol abuse Brother   . Colon cancer Maternal Uncle     dx in his 56s  . Cancer Maternal Aunt     unknown type  . Ovarian cancer Other 42   the patient's father died at the age of 42. The patient's mother died at the age of 63 from lung cancer. She was not a smoker. The cancer was diagnosed when she was 70 years old. The patient's mother had 2 sisters and one brother. One of those sisters had lung cancer and a brother had colon cancer, both diagnosed in their 9s. The patient herself has one brother who died from colon cancer at the age of 19. That brother has a daughter who was diagnosed with uterine cancer at the age of 1. All this raises the question of possible Lynch syndrome and I have referred the patient for genetics counseling.  GYNECOLOGIC HISTORY: Menarche age 77, menopause around 83. The patient is GX P0. She took birth control pills for many years with no complications.  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 Her stepchildren are Debra Mcintyre who lives in [place]" and is financial vice president of [employer], and Debra Mcintyre who lives in [place] and works as a Transport planner. The patient has 5 grandchildren. She is currently not a  church attender   ADVANCED DIRECTIVES: Not in place; this was discussed 10/29/2012 and the patient was advised to complete a healthcare power of attorney document  HEALTH MAINTENANCE: History  Substance Use Topics  . Smoking status: Former Smoker -- 1.00 packs/day for 20 years    Types: Cigarettes    Quit date: 04/02/1986  . Smokeless tobacco: Never Used  . Alcohol Use: No     Colonoscopy: 2012  PAP: 2012  Bone density:  Lipid panel:  No Known Allergies  Current Outpatient Prescriptions  Medication Sig Dispense Refill  . aspirin 325 MG tablet Take 325 mg by mouth daily.        . Calcium Carbonate-Vitamin D (CALCIUM + D PO) Take 1 tablet by mouth 2 (two) times daily.      . clopidogrel (PLAVIX) 75 MG tablet TAKE ONE TABLET BY MOUTH ONCE DAILY  30 tablet  6  . CRESTOR 20 MG tablet TAKE ONE TABLET BY MOUTH DAILY  30 tablet  6  . famotidine (PEPCID) 40 MG tablet       . hyaluronate sodium (RADIAPLEXRX) GEL Apply topically  2 (two) times daily.      Marland Kitchen HYDROcodone-acetaminophen (NORCO/VICODIN) 5-325 MG per tablet       . lisinopril (PRINIVIL,ZESTRIL) 20 MG tablet Take 1 tablet (20 mg total) by mouth daily.  30 tablet  6  . LORazepam (ATIVAN) 0.5 MG tablet       . metoprolol succinate (TOPROL-XL) 100 MG 24 hr tablet TAKE ONE TABLET BY MOUTH ONCE DAILY  30 tablet  6  . metroNIDAZOLE (METROCREAM) 0.75 % cream Apply 1 application topically 2 (two) times daily. As directed      . Multiple Vitamin (MULTIVITAMIN) capsule Take 1 capsule by mouth daily.        Marland Kitchen oxyCODONE-acetaminophen (PERCOCET/ROXICET) 5-325 MG per tablet       . SYNTHROID 125 MCG tablet TAKE ONE TABLET BY MOUTH ONCE DAILY  30 tablet  6  . UNABLE TO FIND Rx: L8000- Post Surgical Bras (Quantity: 6) L8020- Non-Silicone Breast Prosthesis (Quantity: 1) L8030- Silicone Breast Prosthesis (Quantity: 1) Dx: 174.9; Right Mastectomy  1 each  0  . venlafaxine XR (EFFEXOR-XR) 75 MG 24 hr capsule TAKE ONE CAPSULE BY MOUTH DAILY.  30  capsule  6   No current facility-administered medications for this visit.    OBJECTIVE: Middle-aged white woman who appears stated age 35 Vitals:   07/24/13 1407  BP: 116/76  Pulse: 89  Temp: 97.9 F (36.6 C)  Resp: 18     Body mass index is 21.03 kg/(m^2).    ECOG FS: 1 Filed Weights   07/24/13 1407  Weight: 142 lb 8 oz (64.638 kg)   Hair is coming back daughter, with a couple of white patches, and moderately currently Sclerae unicteric, pupils are round and equal Oropharynx clear, no ulcerations or thrush No cervical or supraclavicular adenopathy; Lungs show no rales or wheezes, good excursion bilaterally Heart regular rate and rhythm, no murmur appreciated Abdomen soft, nontender, positive bowel sounds MSK no focal spinal tenderness, no upper extremity lymphedema Neuro: nonfocal, well oriented, positive affect Breasts: The right breast is status postmastectomy. There is mild erythema over the radiation port area there is no evidence of recurrence. The right axilla is benign. The left breast is unremarkable   LAB RESULTS: Lab Results  Component Value Date   WBC 6.2 07/24/2013   NEUTROABS 4.1 07/24/2013   HGB 12.5 07/24/2013   HCT 36.2 07/24/2013   MCV 82.6 07/24/2013   PLT 208 07/24/2013      Chemistry      Component Value Date/Time   NA 139 05/13/2013 1134   NA 133* 04/24/2013 0720   K 4.0 05/13/2013 1134   K 3.7 04/24/2013 0720   CL 100 04/24/2013 0720   CL 102 01/28/2013 0909   CO2 25 05/13/2013 1134   CO2 22 04/24/2013 0720   BUN 11.5 05/13/2013 1134   BUN 11 04/24/2013 0720   CREATININE 0.7 05/13/2013 1134   CREATININE 0.54 04/24/2013 0720      Component Value Date/Time   CALCIUM 9.9 05/13/2013 1134   CALCIUM 8.7 04/24/2013 0720   ALKPHOS 65 05/13/2013 1134   ALKPHOS 47 06/04/2012 1124   AST 17 05/13/2013 1134   AST 27 06/04/2012 1124   ALT 12 05/13/2013 1134   ALT 32 06/04/2012 1124   BILITOT 0.69 05/13/2013 1134   BILITOT 1.1 06/04/2012 1124        STUDIES: Mr Hip Left Wo Contrast  07/02/2013   CLINICAL DATA:  Left hip pain for 3 weeks. History of breast cancer  diagnosed over, 2014.  EXAM: MRI OF THE LEFT HIP WITHOUT CONTRAST  TECHNIQUE: Multiplanar, multisequence MR imaging was performed. No intravenous contrast was administered.  COMPARISON:  PET CT scan 10/24/2012. CT abdomen and pelvis 06/22/2008. MRI lumbar spine 06/14/2013.  FINDINGS: There is no bone marrow signal abnormality to suggest metastatic disease. Both hips are located. No fracture or avascular necrosis is present. There is no hip joint effusion. No evidence of bursitis is identified. No notable degenerative change is seen about the hips. Lower lumbar spondylosis is identified as described on report of the comparison MRI. Imaged intrapelvic contents demonstrate no focal abnormality.  IMPRESSION: No finding to explain the patient's left hip pain. Negative for metastatic disease.  Lower lumbar spondylosis as seen on comparison lumbar spine MRI.   Electronically Signed   By: Drusilla Kanner M.D.   On: 07/02/2013 12:42    ASSESSMENT: 72 y.o. Debra Mcintyre woman   (1)  status post right breast and right axillary lymph node biopsies February 2014 of a clinical T2, pN1, stage IIB  invasive ductal carcinoma, grade 3, triple negative, with an MIB-1 of 15%  (2)  a third biopsy from a different quadrant 10/23/2012 showed ductal carcinoma in situ  (3)  treated in the neoadjuvant setting with 4 dose dense cycles of doxorubicin/ cyclophosphamide 12/23/2012, followed by 7 weekly doses of paclitaxel and completed 03/03/2013.  (4) status post right mastectomy and sentinel lymph node sampling 04/23/2013 for a residual pT1b pN1 invasive ductal carcinoma, grade 3, again triple negative [2/10 sample lymph nodes were involved by tumor, both with micrometastatic deposits]  (5) adjuvant radiation scheduled to be completed 08/13/2013  (6) the patient's to young nieces have been tested  genetically and are both negative for BRCA mutations; no further testing is planned planned    PLAN:  Lichelle is recovering slowly but steadily from her chemotherapy treatments. She still has some lingering side effects particularly the case perversion and mild fatigue. Of course the fatigue is going to get worse with radiation. I have strongly encouraged her to start a water aerobics program and then move on to her regular walking program as soon as her left hip is feeling stronger. We also discussed the emotional changes that occur at the end of active treatment. This generally resembles posttraumatic stress. We have a wonderful program, "finding your new normal" I encouraged her to participate in it. The next group will be organized February 2015.  She can have her port removed anytime after she completes radiation.  I am going to see her again in March. If all is going well at that point we will start seeing her on an every 6 month basis here.   Lowella Dell Mcintyre   07/24/2013

## 2013-07-24 NOTE — Addendum Note (Signed)
Addended by: Billey Co on: 07/24/2013 05:17 PM   Modules accepted: Orders

## 2013-07-25 ENCOUNTER — Ambulatory Visit: Payer: Medicare Other

## 2013-07-25 ENCOUNTER — Ambulatory Visit
Admission: RE | Admit: 2013-07-25 | Discharge: 2013-07-25 | Disposition: A | Discharge status: Home / Self Care | Payer: Medicare Other | Source: Ambulatory Visit | Attending: Radiation Oncology | Admitting: Radiation Oncology

## 2013-07-25 DIAGNOSIS — M545 Low back pain: Secondary | ICD-10-CM | POA: Diagnosis not present

## 2013-07-25 DIAGNOSIS — M79609 Pain in unspecified limb: Secondary | ICD-10-CM | POA: Diagnosis not present

## 2013-07-25 DIAGNOSIS — L819 Disorder of pigmentation, unspecified: Secondary | ICD-10-CM | POA: Diagnosis not present

## 2013-07-25 DIAGNOSIS — C771 Secondary and unspecified malignant neoplasm of intrathoracic lymph nodes: Secondary | ICD-10-CM | POA: Diagnosis not present

## 2013-07-25 DIAGNOSIS — C50919 Malignant neoplasm of unspecified site of unspecified female breast: Secondary | ICD-10-CM | POA: Diagnosis not present

## 2013-07-25 DIAGNOSIS — Z510 Encounter for antineoplastic radiation therapy: Secondary | ICD-10-CM | POA: Diagnosis not present

## 2013-07-28 ENCOUNTER — Encounter: Payer: Self-pay | Admitting: Radiation Oncology

## 2013-07-28 ENCOUNTER — Ambulatory Visit
Admission: RE | Admit: 2013-07-28 | Discharge: 2013-07-28 | Disposition: A | Discharge status: Home / Self Care | Payer: Medicare Other | Source: Ambulatory Visit | Attending: Radiation Oncology | Admitting: Radiation Oncology

## 2013-07-28 ENCOUNTER — Ambulatory Visit: Payer: Medicare Other | Admitting: Radiation Oncology

## 2013-07-28 VITALS — BP 130/77 | HR 86 | Temp 97.8°F | Resp 20 | Wt 141.0 lb

## 2013-07-28 DIAGNOSIS — M545 Low back pain: Secondary | ICD-10-CM | POA: Diagnosis not present

## 2013-07-28 DIAGNOSIS — C771 Secondary and unspecified malignant neoplasm of intrathoracic lymph nodes: Secondary | ICD-10-CM | POA: Diagnosis not present

## 2013-07-28 DIAGNOSIS — C50411 Malignant neoplasm of upper-outer quadrant of right female breast: Secondary | ICD-10-CM

## 2013-07-28 DIAGNOSIS — Z510 Encounter for antineoplastic radiation therapy: Secondary | ICD-10-CM | POA: Diagnosis not present

## 2013-07-28 DIAGNOSIS — C50919 Malignant neoplasm of unspecified site of unspecified female breast: Secondary | ICD-10-CM | POA: Diagnosis not present

## 2013-07-28 DIAGNOSIS — M79609 Pain in unspecified limb: Secondary | ICD-10-CM | POA: Diagnosis not present

## 2013-07-28 DIAGNOSIS — L819 Disorder of pigmentation, unspecified: Secondary | ICD-10-CM | POA: Diagnosis not present

## 2013-07-28 NOTE — Progress Notes (Signed)
Pt denies pain, fatigue, loss of appetite. She is applying Radiaplex to right breast treatment area for hyperpigmentation. She states the Radiaplex gives relief of occasional itching.

## 2013-07-28 NOTE — Progress Notes (Signed)
Weekly Management Note:  Site: Right chest wall/regional lymph node Current Dose:  4140  cGy Projected Dose: 5040  cGy followed by right mastectomy scar boost  Narrative: The patient is seen today for routine under treatment assessment. CBCT/MVCT images/port films were reviewed. The chart was reviewed.   She had good success following her back injection in her low back pain is much improved. She continues with her Radioplex gel.  Physical Examination:  Filed Vitals:   07/28/13 1440  BP: 130/77  Pulse: 86  Temp: 97.8 F (36.6 C)  Resp: 20  .  Weight: 141 lb (63.957 kg). There is hyperpigmentation and erythema of the skin along the right chest wall with no areas of desquamation.  Impression: Tolerating radiation therapy well.  Plan: Continue radiation therapy as planned.

## 2013-07-29 ENCOUNTER — Ambulatory Visit: Payer: Medicare Other

## 2013-07-29 ENCOUNTER — Ambulatory Visit
Admission: RE | Admit: 2013-07-29 | Discharge: 2013-07-29 | Disposition: A | Discharge status: Home / Self Care | Payer: Medicare Other | Source: Ambulatory Visit | Attending: Radiation Oncology | Admitting: Radiation Oncology

## 2013-07-29 DIAGNOSIS — Z510 Encounter for antineoplastic radiation therapy: Secondary | ICD-10-CM | POA: Diagnosis not present

## 2013-07-29 DIAGNOSIS — M545 Low back pain: Secondary | ICD-10-CM | POA: Diagnosis not present

## 2013-07-29 DIAGNOSIS — L819 Disorder of pigmentation, unspecified: Secondary | ICD-10-CM | POA: Diagnosis not present

## 2013-07-29 DIAGNOSIS — C50419 Malignant neoplasm of upper-outer quadrant of unspecified female breast: Secondary | ICD-10-CM | POA: Diagnosis not present

## 2013-07-29 DIAGNOSIS — C50411 Malignant neoplasm of upper-outer quadrant of right female breast: Secondary | ICD-10-CM

## 2013-07-29 DIAGNOSIS — C771 Secondary and unspecified malignant neoplasm of intrathoracic lymph nodes: Secondary | ICD-10-CM | POA: Diagnosis not present

## 2013-07-29 DIAGNOSIS — C50919 Malignant neoplasm of unspecified site of unspecified female breast: Secondary | ICD-10-CM | POA: Diagnosis not present

## 2013-07-29 DIAGNOSIS — M79609 Pain in unspecified limb: Secondary | ICD-10-CM | POA: Diagnosis not present

## 2013-07-29 NOTE — Progress Notes (Signed)
Electron beam simulation note: The patient was taken to the [facility] treatment table. She was set up RAO to her right chest wall. I marked her skin for construction of a complex block to conform her field. One custom block is constructed. I prescribing 1000 cGy in 5 sessions utilizing 6 MEV electrons. 0.8 cm of custom bolus will be constructed in apply to her skin on the first day of her treatment. A special port plan is requested.

## 2013-07-30 ENCOUNTER — Ambulatory Visit: Payer: Medicare Other

## 2013-07-30 ENCOUNTER — Ambulatory Visit
Admission: RE | Admit: 2013-07-30 | Discharge: 2013-07-30 | Disposition: A | Discharge status: Home / Self Care | Payer: Medicare Other | Source: Ambulatory Visit | Attending: Radiation Oncology | Admitting: Radiation Oncology

## 2013-07-30 DIAGNOSIS — C771 Secondary and unspecified malignant neoplasm of intrathoracic lymph nodes: Secondary | ICD-10-CM | POA: Diagnosis not present

## 2013-07-30 DIAGNOSIS — M79609 Pain in unspecified limb: Secondary | ICD-10-CM | POA: Diagnosis not present

## 2013-07-30 DIAGNOSIS — Z510 Encounter for antineoplastic radiation therapy: Secondary | ICD-10-CM | POA: Diagnosis not present

## 2013-07-30 DIAGNOSIS — C50919 Malignant neoplasm of unspecified site of unspecified female breast: Secondary | ICD-10-CM | POA: Diagnosis not present

## 2013-07-30 DIAGNOSIS — M545 Low back pain: Secondary | ICD-10-CM | POA: Diagnosis not present

## 2013-07-30 DIAGNOSIS — L819 Disorder of pigmentation, unspecified: Secondary | ICD-10-CM | POA: Diagnosis not present

## 2013-07-31 ENCOUNTER — Ambulatory Visit
Admission: RE | Admit: 2013-07-31 | Discharge: 2013-07-31 | Disposition: A | Discharge status: Home / Self Care | Payer: Medicare Other | Source: Ambulatory Visit | Attending: Radiation Oncology | Admitting: Radiation Oncology

## 2013-07-31 DIAGNOSIS — C50419 Malignant neoplasm of upper-outer quadrant of unspecified female breast: Secondary | ICD-10-CM | POA: Diagnosis not present

## 2013-07-31 DIAGNOSIS — Z510 Encounter for antineoplastic radiation therapy: Secondary | ICD-10-CM | POA: Diagnosis not present

## 2013-07-31 DIAGNOSIS — C771 Secondary and unspecified malignant neoplasm of intrathoracic lymph nodes: Secondary | ICD-10-CM | POA: Diagnosis not present

## 2013-07-31 DIAGNOSIS — M545 Low back pain: Secondary | ICD-10-CM | POA: Diagnosis not present

## 2013-07-31 DIAGNOSIS — C50919 Malignant neoplasm of unspecified site of unspecified female breast: Secondary | ICD-10-CM | POA: Diagnosis not present

## 2013-07-31 DIAGNOSIS — L819 Disorder of pigmentation, unspecified: Secondary | ICD-10-CM | POA: Diagnosis not present

## 2013-07-31 DIAGNOSIS — M79609 Pain in unspecified limb: Secondary | ICD-10-CM | POA: Diagnosis not present

## 2013-08-01 ENCOUNTER — Ambulatory Visit
Admission: RE | Admit: 2013-08-01 | Discharge: 2013-08-01 | Disposition: A | Discharge status: Home / Self Care | Payer: Medicare Other | Source: Ambulatory Visit | Attending: Radiation Oncology | Admitting: Radiation Oncology

## 2013-08-01 DIAGNOSIS — L819 Disorder of pigmentation, unspecified: Secondary | ICD-10-CM | POA: Diagnosis not present

## 2013-08-01 DIAGNOSIS — C771 Secondary and unspecified malignant neoplasm of intrathoracic lymph nodes: Secondary | ICD-10-CM | POA: Diagnosis not present

## 2013-08-01 DIAGNOSIS — M79609 Pain in unspecified limb: Secondary | ICD-10-CM | POA: Diagnosis not present

## 2013-08-01 DIAGNOSIS — C50919 Malignant neoplasm of unspecified site of unspecified female breast: Secondary | ICD-10-CM | POA: Diagnosis not present

## 2013-08-01 DIAGNOSIS — Z510 Encounter for antineoplastic radiation therapy: Secondary | ICD-10-CM | POA: Diagnosis not present

## 2013-08-01 DIAGNOSIS — M545 Low back pain: Secondary | ICD-10-CM | POA: Diagnosis not present

## 2013-08-04 ENCOUNTER — Ambulatory Visit
Admission: RE | Admit: 2013-08-04 | Discharge: 2013-08-04 | Disposition: A | Discharge status: Home / Self Care | Payer: Medicare Other | Source: Ambulatory Visit | Attending: Radiation Oncology | Admitting: Radiation Oncology

## 2013-08-04 VITALS — BP 87/56 | HR 106 | Temp 98.7°F | Wt 141.1 lb

## 2013-08-04 DIAGNOSIS — C50919 Malignant neoplasm of unspecified site of unspecified female breast: Secondary | ICD-10-CM | POA: Diagnosis not present

## 2013-08-04 DIAGNOSIS — C771 Secondary and unspecified malignant neoplasm of intrathoracic lymph nodes: Secondary | ICD-10-CM | POA: Diagnosis not present

## 2013-08-04 DIAGNOSIS — M545 Low back pain: Secondary | ICD-10-CM | POA: Diagnosis not present

## 2013-08-04 DIAGNOSIS — C50411 Malignant neoplasm of upper-outer quadrant of right female breast: Secondary | ICD-10-CM

## 2013-08-04 DIAGNOSIS — L819 Disorder of pigmentation, unspecified: Secondary | ICD-10-CM | POA: Diagnosis not present

## 2013-08-04 DIAGNOSIS — Z510 Encounter for antineoplastic radiation therapy: Secondary | ICD-10-CM | POA: Diagnosis not present

## 2013-08-04 DIAGNOSIS — M79609 Pain in unspecified limb: Secondary | ICD-10-CM | POA: Diagnosis not present

## 2013-08-04 NOTE — Progress Notes (Signed)
Weekly Management Note:  Site: Right chest wall/regional lymph nodes Current Dose:  5040  cGy Projected Dose: 5220  cGy followed by right mastectomy scar boost  Narrative: The patient is seen today for routine under treatment assessment. CBCT/MVCT images/port films were reviewed. The chart was reviewed.   She is without complaints today. Her back pain remains improved. She uses Radioplex gel.  Physical Examination:  Filed Vitals:   08/04/13 1438  BP: 87/56  Pulse: 106  Temp: 98.7 F (37.1 C)  .  Weight: 141 lb 1.6 oz (64.003 kg). There is hyperpigmentation and patchy dry desquamation the skin along the right chest wall. No areas of moist desquamation.  Impression: Tolerating radiation therapy well.   Plan: Continue radiation therapy as planned.

## 2013-08-04 NOTE — Progress Notes (Signed)
Patient for weekly assessment of radiation to right breast.Completed 28 treatments today.Skin with increased redness without peeling.Occasional shooting pains.Moderate fatigue but able to perform usual daily activities.Contine application of radiaplex.

## 2013-08-05 ENCOUNTER — Ambulatory Visit
Admission: RE | Admit: 2013-08-05 | Discharge: 2013-08-05 | Disposition: A | Discharge status: Home / Self Care | Payer: Medicare Other | Source: Ambulatory Visit | Attending: Radiation Oncology | Admitting: Radiation Oncology

## 2013-08-05 DIAGNOSIS — C50919 Malignant neoplasm of unspecified site of unspecified female breast: Secondary | ICD-10-CM | POA: Diagnosis not present

## 2013-08-05 DIAGNOSIS — M545 Low back pain: Secondary | ICD-10-CM | POA: Diagnosis not present

## 2013-08-05 DIAGNOSIS — M79609 Pain in unspecified limb: Secondary | ICD-10-CM | POA: Diagnosis not present

## 2013-08-05 DIAGNOSIS — Z510 Encounter for antineoplastic radiation therapy: Secondary | ICD-10-CM | POA: Diagnosis not present

## 2013-08-05 DIAGNOSIS — L819 Disorder of pigmentation, unspecified: Secondary | ICD-10-CM | POA: Diagnosis not present

## 2013-08-05 DIAGNOSIS — C771 Secondary and unspecified malignant neoplasm of intrathoracic lymph nodes: Secondary | ICD-10-CM | POA: Diagnosis not present

## 2013-08-06 ENCOUNTER — Ambulatory Visit: Payer: Medicare Other

## 2013-08-06 ENCOUNTER — Ambulatory Visit: Admission: RE | Admit: 2013-08-06 | Payer: Medicare Other | Source: Ambulatory Visit

## 2013-08-07 ENCOUNTER — Ambulatory Visit: Payer: Medicare Other

## 2013-08-08 ENCOUNTER — Ambulatory Visit: Payer: Medicare Other

## 2013-08-08 ENCOUNTER — Ambulatory Visit
Admission: RE | Admit: 2013-08-08 | Discharge: 2013-08-08 | Disposition: A | Discharge status: Home / Self Care | Payer: Medicare Other | Source: Ambulatory Visit | Attending: Radiation Oncology | Admitting: Radiation Oncology

## 2013-08-08 DIAGNOSIS — C50919 Malignant neoplasm of unspecified site of unspecified female breast: Secondary | ICD-10-CM | POA: Diagnosis not present

## 2013-08-08 DIAGNOSIS — M79609 Pain in unspecified limb: Secondary | ICD-10-CM | POA: Diagnosis not present

## 2013-08-08 DIAGNOSIS — C771 Secondary and unspecified malignant neoplasm of intrathoracic lymph nodes: Secondary | ICD-10-CM | POA: Diagnosis not present

## 2013-08-08 DIAGNOSIS — M545 Low back pain: Secondary | ICD-10-CM | POA: Diagnosis not present

## 2013-08-08 DIAGNOSIS — Z510 Encounter for antineoplastic radiation therapy: Secondary | ICD-10-CM | POA: Diagnosis not present

## 2013-08-08 DIAGNOSIS — L819 Disorder of pigmentation, unspecified: Secondary | ICD-10-CM | POA: Diagnosis not present

## 2013-08-08 DIAGNOSIS — C50419 Malignant neoplasm of upper-outer quadrant of unspecified female breast: Secondary | ICD-10-CM | POA: Diagnosis not present

## 2013-08-11 ENCOUNTER — Ambulatory Visit
Admission: RE | Admit: 2013-08-11 | Discharge: 2013-08-11 | Disposition: A | Discharge status: Home / Self Care | Payer: Medicare Other | Source: Ambulatory Visit | Attending: Radiation Oncology | Admitting: Radiation Oncology

## 2013-08-11 DIAGNOSIS — Z510 Encounter for antineoplastic radiation therapy: Secondary | ICD-10-CM | POA: Diagnosis not present

## 2013-08-11 DIAGNOSIS — L819 Disorder of pigmentation, unspecified: Secondary | ICD-10-CM | POA: Diagnosis not present

## 2013-08-11 DIAGNOSIS — C50419 Malignant neoplasm of upper-outer quadrant of unspecified female breast: Secondary | ICD-10-CM | POA: Diagnosis not present

## 2013-08-11 DIAGNOSIS — M79609 Pain in unspecified limb: Secondary | ICD-10-CM | POA: Diagnosis not present

## 2013-08-11 DIAGNOSIS — M545 Low back pain: Secondary | ICD-10-CM | POA: Diagnosis not present

## 2013-08-11 DIAGNOSIS — C50919 Malignant neoplasm of unspecified site of unspecified female breast: Secondary | ICD-10-CM | POA: Diagnosis not present

## 2013-08-11 DIAGNOSIS — C771 Secondary and unspecified malignant neoplasm of intrathoracic lymph nodes: Secondary | ICD-10-CM | POA: Diagnosis not present

## 2013-08-12 ENCOUNTER — Ambulatory Visit
Admission: RE | Admit: 2013-08-12 | Discharge: 2013-08-12 | Disposition: A | Discharge status: Home / Self Care | Payer: Medicare Other | Source: Ambulatory Visit | Attending: Radiation Oncology | Admitting: Radiation Oncology

## 2013-08-12 ENCOUNTER — Ambulatory Visit: Payer: Medicare Other

## 2013-08-12 ENCOUNTER — Encounter: Payer: Self-pay | Admitting: Radiation Oncology

## 2013-08-12 VITALS — BP 116/73 | HR 89 | Temp 97.9°F | Resp 20 | Wt 140.1 lb

## 2013-08-12 DIAGNOSIS — C50919 Malignant neoplasm of unspecified site of unspecified female breast: Secondary | ICD-10-CM

## 2013-08-12 DIAGNOSIS — C771 Secondary and unspecified malignant neoplasm of intrathoracic lymph nodes: Secondary | ICD-10-CM | POA: Diagnosis not present

## 2013-08-12 DIAGNOSIS — M545 Low back pain: Secondary | ICD-10-CM | POA: Diagnosis not present

## 2013-08-12 DIAGNOSIS — Z510 Encounter for antineoplastic radiation therapy: Secondary | ICD-10-CM | POA: Diagnosis not present

## 2013-08-12 DIAGNOSIS — M79609 Pain in unspecified limb: Secondary | ICD-10-CM | POA: Diagnosis not present

## 2013-08-12 DIAGNOSIS — L819 Disorder of pigmentation, unspecified: Secondary | ICD-10-CM | POA: Diagnosis not present

## 2013-08-12 NOTE — Progress Notes (Addendum)
Pt denies pain, loss of appetite. She is fatigued, paces herself. She is applying Radiaplex to right chest wall treatment area for hyperpigmentation, dry desquamation. Pt will complete tx tomorrow after receiving BID treatments. She has 1 month FU card. Instructed her to continue w/Radiaplex x 2 weeks then switch to lotion w/vitamin E. Pt has information on FYNN.  

## 2013-08-12 NOTE — Progress Notes (Signed)
Weekly Management Note Current Dose: 6  Gy  Projected Dose: 10 Gy (scar boost (52.2 to chest wall)  Narrative:  The patient presents for routine under treatment assessment.  CBCT/MVCT images/Port film x-rays were reviewed.  The chart was checked. Doing well. Looking forward to being done. Using radiaplex. Some fatigue.   Physical Findings: Weight: 140 lb 1.6 oz (63.549 kg). Dermatitis over chest wall. No moist desquamation.   Impression:  The patient is tolerating radiation.  Plan:  Continue treatment as planned. Continue radiaplex.

## 2013-08-12 NOTE — Progress Notes (Deleted)
Pt denies pain, loss of appetite. She is fatigued, paces herself. She is applying Radiaplex to right chest wall treatment area for hyperpigmentation, dry desquamation. Pt will complete tx tomorrow after receiving BID treatments. She has 1 month FU card. Instructed her to continue w/Radiaplex x 2 weeks then switch to lotion w/vitamin E. Pt has information on FYNN.

## 2013-08-13 ENCOUNTER — Ambulatory Visit
Admission: RE | Admit: 2013-08-13 | Discharge: 2013-08-13 | Disposition: A | Discharge status: Home / Self Care | Payer: Medicare Other | Source: Ambulatory Visit | Attending: Radiation Oncology | Admitting: Radiation Oncology

## 2013-08-13 DIAGNOSIS — Z510 Encounter for antineoplastic radiation therapy: Secondary | ICD-10-CM | POA: Diagnosis not present

## 2013-08-13 DIAGNOSIS — M545 Low back pain: Secondary | ICD-10-CM | POA: Diagnosis not present

## 2013-08-13 DIAGNOSIS — C771 Secondary and unspecified malignant neoplasm of intrathoracic lymph nodes: Secondary | ICD-10-CM | POA: Diagnosis not present

## 2013-08-13 DIAGNOSIS — M79609 Pain in unspecified limb: Secondary | ICD-10-CM | POA: Diagnosis not present

## 2013-08-13 DIAGNOSIS — L819 Disorder of pigmentation, unspecified: Secondary | ICD-10-CM | POA: Diagnosis not present

## 2013-08-13 DIAGNOSIS — C50919 Malignant neoplasm of unspecified site of unspecified female breast: Secondary | ICD-10-CM | POA: Diagnosis not present

## 2013-08-16 ENCOUNTER — Encounter: Payer: Self-pay | Admitting: Radiation Oncology

## 2013-08-16 NOTE — Progress Notes (Signed)
[facility] End of Treatment Note  Name:Debra Mcintyre  Date: 08/16/2013 BCW:[id number] DOB:06-07-1941   Status:outpatient    CC: [name], MD  Dr. [name]  REFERRING PHYSICIAN:  Dr. 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 10 MV photons, LAO to right supraclavicular/axillary region, doses prescribed at 3 cm depth. PA axillary field was utilized to bring the mid axillary dose up to approximately 4600 cGy in 29 sessions.  6 MEV electrons to the right chest wall/mastectomy scar with 0.8 cm custom bolus daily to maximize the dose to the skin surface.              NARRATIVE:   After receiving an initial 9 fractions of radiation therapy she developed a herniated lumbar disc which required a one month rest before she could return to her treatment position for treatment.  She required resimulation  with her legs bent for comfort. Because of her treatment rest one extra treatment was delivered for the biological "decay" She completed the remainder of her treatment as prescribed.   She develop areas of dry desquamation and she was treated with Radioplex gel.                 PLAN: Routine followup in one month. Patient instructed to call if questions or worsening complaints in interim.

## 2013-08-18 DIAGNOSIS — IMO0002 Reserved for concepts with insufficient information to code with codable children: Secondary | ICD-10-CM | POA: Diagnosis not present

## 2013-09-12 ENCOUNTER — Encounter: Payer: Self-pay | Admitting: *Deleted

## 2013-09-16 ENCOUNTER — Ambulatory Visit
Admission: RE | Admit: 2013-09-16 | Discharge: 2013-09-16 | Disposition: A | Discharge status: Home / Self Care | Payer: Medicare Other | Source: Ambulatory Visit | Attending: Radiation Oncology | Admitting: Radiation Oncology

## 2013-09-16 ENCOUNTER — Encounter: Payer: Self-pay | Admitting: Radiation Oncology

## 2013-09-16 VITALS — BP 126/70 | HR 91 | Temp 98.6°F | Resp 20 | Wt 147.0 lb

## 2013-09-16 DIAGNOSIS — C50411 Malignant neoplasm of upper-outer quadrant of right female breast: Secondary | ICD-10-CM

## 2013-09-16 NOTE — Progress Notes (Signed)
Pt denies pain, fatigue, loss of appetite. She states her skin has healed. She states she occasionally feels a "pulling sensation" in her right chest wall area.

## 2013-09-16 NOTE — Progress Notes (Signed)
Followup note:  Ms. 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 Her back pain remains improved. She sees [name] for a followup visit on April 6. She wants her Port-A-Cath removed.  Physical examination: Alert and oriented. Filed Vitals:   09/16/13 1002  BP: 126/70  Pulse: 91  Temp: 98.6 F (37 C)  Resp: 20   Head and neck examination: She wears a wig. Nodes: There is no palpable cervical, supraclavicular, or axillary lymphadenopathy. Chest: Lungs clear. Breasts: There is residual erythema/hyperpigmentation the skin along the right chest wall and clavicular region. There is residual patchy dry desquamation. Left breast without masses or lesions. Abdomen: Without hepatomegaly. Extremities: Without edema.  Impression: Satisfactory progress  Plan: She will maintain her followup through [name] and Dr. Donne Hazel. I've not scheduled the patient for a formal followup visit, but I would be more than happy to see her in the future should the need arise.

## 2013-10-10 ENCOUNTER — Other Ambulatory Visit: Payer: Self-pay | Admitting: Oncology

## 2013-10-10 ENCOUNTER — Telehealth: Payer: Self-pay | Admitting: *Deleted

## 2013-10-10 DIAGNOSIS — Z9011 Acquired absence of right breast and nipple: Secondary | ICD-10-CM

## 2013-10-10 DIAGNOSIS — Z853 Personal history of malignant neoplasm of breast: Secondary | ICD-10-CM

## 2013-10-10 NOTE — Telephone Encounter (Signed)
Returned the pt call due to pt wanted to know her appt d/t. gv appt for 11/17/13 w/ labs@ 2:30pm and ov@ 3pm. Made the pt aware that i will mail a letter/avs...td

## 2013-10-13 ENCOUNTER — Encounter: Payer: Self-pay | Admitting: Pulmonary Disease

## 2013-10-13 ENCOUNTER — Ambulatory Visit (INDEPENDENT_AMBULATORY_CARE_PROVIDER_SITE_OTHER): Payer: Medicare Other | Admitting: Pulmonary Disease

## 2013-10-13 ENCOUNTER — Ambulatory Visit (INDEPENDENT_AMBULATORY_CARE_PROVIDER_SITE_OTHER)
Admission: RE | Admit: 2013-10-13 | Discharge: 2013-10-13 | Disposition: A | Discharge status: Home / Self Care | Payer: Medicare Other | Source: Ambulatory Visit | Attending: Pulmonary Disease | Admitting: Pulmonary Disease

## 2013-10-13 VITALS — BP 112/64 | HR 82 | Temp 97.6°F | Ht 69.0 in | Wt 147.6 lb

## 2013-10-13 DIAGNOSIS — I10 Essential (primary) hypertension: Secondary | ICD-10-CM | POA: Diagnosis not present

## 2013-10-13 DIAGNOSIS — C50919 Malignant neoplasm of unspecified site of unspecified female breast: Secondary | ICD-10-CM

## 2013-10-13 DIAGNOSIS — D649 Anemia, unspecified: Secondary | ICD-10-CM

## 2013-10-13 DIAGNOSIS — F411 Generalized anxiety disorder: Secondary | ICD-10-CM

## 2013-10-13 DIAGNOSIS — I679 Cerebrovascular disease, unspecified: Secondary | ICD-10-CM

## 2013-10-13 DIAGNOSIS — E7800 Pure hypercholesterolemia, unspecified: Secondary | ICD-10-CM

## 2013-10-13 DIAGNOSIS — J984 Other disorders of lung: Secondary | ICD-10-CM | POA: Diagnosis not present

## 2013-10-13 DIAGNOSIS — M1990 Unspecified osteoarthritis, unspecified site: Secondary | ICD-10-CM

## 2013-10-13 DIAGNOSIS — C773 Secondary and unspecified malignant neoplasm of axilla and upper limb lymph nodes: Secondary | ICD-10-CM

## 2013-10-13 DIAGNOSIS — E039 Hypothyroidism, unspecified: Secondary | ICD-10-CM

## 2013-10-13 NOTE — Patient Instructions (Signed)
Today we updated your med list in our EPIC system...    Continue your current medications the same...  We decided to restart your Gabapentin 300mg  tabs- this time for the neuropathy symptoms.    Start w/ one tab in evening for about 3 wks,     Then increase to one tab twice daily thereafter...  Today we did your follow up CXR... Please return to our lab one morning this week for your FASTING blood work...    We will contact you w/ the results when available...   Call for any questions...  Let's plan a follow up visit in 57mo, sooner if needed for problems.Marland KitchenMarland Kitchen

## 2013-10-15 ENCOUNTER — Telehealth (INDEPENDENT_AMBULATORY_CARE_PROVIDER_SITE_OTHER): Payer: Self-pay

## 2013-10-15 NOTE — Telephone Encounter (Signed)
Message copied by Illene Regulus on Wed Oct 15, 2013  2:19 PM ------      Message from: Donne Hazel, [place]      Created: Fri Oct 10, 2013 10:15 AM      Regarding: FW: port removal                   ----- Message -----         From: Danny Lawless, RN         Sent: 10/09/2013  11:41 AM           To: [name], MD      Subject: port removal                                             Pt is post chemotherapy/radiation and is requesting for port to be removed.            Okay per Dr Jana Hakim.            Thank You- Marlon Pel RN ------

## 2013-10-15 NOTE — Telephone Encounter (Signed)
LM for pt to call me. I want to talk to her about the [facility] removal.

## 2013-10-17 NOTE — Telephone Encounter (Signed)
Pt called me back and she wants to get scheduled for the [facility] ASAP. The pt is fine with getting it scheduled as outpt. Under local.

## 2013-10-19 ENCOUNTER — Other Ambulatory Visit: Payer: Self-pay | Admitting: Pulmonary Disease

## 2013-10-20 ENCOUNTER — Other Ambulatory Visit (INDEPENDENT_AMBULATORY_CARE_PROVIDER_SITE_OTHER): Payer: Medicare Other

## 2013-10-20 DIAGNOSIS — Z853 Personal history of malignant neoplasm of breast: Secondary | ICD-10-CM | POA: Diagnosis not present

## 2013-10-20 DIAGNOSIS — E7800 Pure hypercholesterolemia, unspecified: Secondary | ICD-10-CM | POA: Diagnosis not present

## 2013-10-20 DIAGNOSIS — D649 Anemia, unspecified: Secondary | ICD-10-CM | POA: Diagnosis not present

## 2013-10-20 DIAGNOSIS — I10 Essential (primary) hypertension: Secondary | ICD-10-CM | POA: Diagnosis not present

## 2013-10-20 DIAGNOSIS — E039 Hypothyroidism, unspecified: Secondary | ICD-10-CM | POA: Diagnosis not present

## 2013-10-20 DIAGNOSIS — Z452 Encounter for adjustment and management of vascular access device: Secondary | ICD-10-CM

## 2013-10-20 LAB — BASIC METABOLIC PANEL
BUN: 15 mg/dL (ref 6–23)
CALCIUM: 9.8 mg/dL (ref 8.4–10.5)
CO2: 27 mEq/L (ref 19–32)
Chloride: 105 mEq/L (ref 96–112)
Creatinine, Ser: 0.6 mg/dL (ref 0.4–1.2)
GFR: 102.73 mL/min (ref >60.00)
GLUCOSE: 102 mg/dL — AB (ref 70–99)
Potassium: 4 mEq/L (ref 3.5–5.1)
Sodium: 141 mEq/L (ref 135–145)

## 2013-10-20 LAB — CBC WITH DIFFERENTIAL/PLATELET
BASOS PCT: 0.9 % (ref 0.0–3.0)
Basophils Absolute: 0.1 10*3/uL (ref 0.0–0.1)
EOS PCT: 1.8 % (ref 0.0–5.0)
Eosinophils Absolute: 0.1 10*3/uL (ref 0.0–0.7)
HEMATOCRIT: 37.6 % (ref 36.0–46.0)
HEMOGLOBIN: 12.6 g/dL (ref 12.0–15.0)
LYMPHS ABS: 1.3 10*3/uL (ref 0.7–4.0)
Lymphocytes Relative: 21.1 % (ref 12.0–46.0)
MCHC: 33.4 g/dL (ref 30.0–36.0)
MCV: 89.3 fl (ref 78.0–100.0)
MONOS PCT: 5.1 % (ref 3.0–12.0)
Monocytes Absolute: 0.3 10*3/uL (ref 0.1–1.0)
NEUTROS ABS: 4.5 10*3/uL (ref 1.4–7.7)
Neutrophils Relative %: 71.1 % (ref 43.0–77.0)
Platelets: 237 10*3/uL (ref 150.0–400.0)
RBC: 4.21 Mil/uL (ref 3.87–5.11)
RDW: 12.4 % (ref 11.5–14.6)
WBC: 6.3 10*3/uL (ref 4.5–10.5)

## 2013-10-20 LAB — HEPATIC FUNCTION PANEL
ALBUMIN: 4 g/dL (ref 3.5–5.2)
ALT: 18 U/L (ref 0–35)
AST: 21 U/L (ref 0–37)
Alkaline Phosphatase: 60 U/L (ref 39–117)
BILIRUBIN TOTAL: 1 mg/dL (ref 0.3–1.2)
Bilirubin, Direct: 0.2 mg/dL (ref 0.0–0.3)
Total Protein: 7.2 g/dL (ref 6.0–8.3)

## 2013-10-20 LAB — LIPID PANEL
CHOLESTEROL: 164 mg/dL (ref 0–200)
HDL: 48.6 mg/dL (ref >39.00)
LDL CALC: 92 mg/dL (ref 0–99)
Total CHOL/HDL Ratio: 3
Triglycerides: 116 mg/dL (ref 0.0–149.0)
VLDL: 23.2 mg/dL (ref 0.0–40.0)

## 2013-10-20 LAB — TSH: TSH: 0.35 u[IU]/mL (ref 0.35–5.50)

## 2013-10-22 ENCOUNTER — Telehealth: Payer: Self-pay | Admitting: Pulmonary Disease

## 2013-10-22 DIAGNOSIS — R911 Solitary pulmonary nodule: Secondary | ICD-10-CM

## 2013-10-22 NOTE — Telephone Encounter (Signed)
Please notify patient> CXR shows unexpected opacity RUL ?etiology- could be scar tissue from XRT but unknown... Heart size is borderline, port-a-cath on lft, scoliosis & compression in lower TSpine... REC> proceed w/ CT chest w/ contrast to better characterize the RUL lesion...   Result Note     Please notify patient>     FLP looks great on Cres20- continue same...    Chems, LFTs, CBC, TSH all look good- continue same meds...       Pt aware of results and order placed to [facility] for CT chest with contrast.

## 2013-10-28 ENCOUNTER — Other Ambulatory Visit: Payer: Self-pay | Admitting: Pulmonary Disease

## 2013-10-28 ENCOUNTER — Other Ambulatory Visit (INDEPENDENT_AMBULATORY_CARE_PROVIDER_SITE_OTHER): Payer: Self-pay | Admitting: *Deleted

## 2013-10-28 MED ORDER — UNABLE TO FIND: Status: DC

## 2013-10-29 ENCOUNTER — Ambulatory Visit (INDEPENDENT_AMBULATORY_CARE_PROVIDER_SITE_OTHER)
Admission: RE | Admit: 2013-10-29 | Discharge: 2013-10-29 | Disposition: A | Discharge status: Home / Self Care | Payer: Medicare Other | Source: Ambulatory Visit | Attending: Pulmonary Disease | Admitting: Pulmonary Disease

## 2013-10-29 ENCOUNTER — Telehealth: Payer: Self-pay | Admitting: Pulmonary Disease

## 2013-10-29 DIAGNOSIS — J984 Other disorders of lung: Secondary | ICD-10-CM | POA: Diagnosis not present

## 2013-10-29 DIAGNOSIS — R911 Solitary pulmonary nodule: Secondary | ICD-10-CM | POA: Diagnosis not present

## 2013-10-29 MED ORDER — IOHEXOL 300 MG/ML  SOLN
80.0000 mL | Freq: Once | INTRAMUSCULAR | Status: AC | PRN
  Administered 2013-10-29: 80 mL via INTRAVENOUS

## 2013-10-29 NOTE — Telephone Encounter (Signed)
Spoke with patient-she is aware that will send message to SN to advise if CT results; once we have those we can call patient back with results, send Rx refill and let patient know of the PCP change as SN is no longer PCP as of 11-12-13.   SN please advise of results. Thanks.

## 2013-10-30 MED ORDER — LISINOPRIL 20 MG PO TABS: ORAL_TABLET | ORAL | Status: DC

## 2013-10-30 NOTE — Telephone Encounter (Signed)
Called and made pt aware SN is retiring from [facility] as of 11/12/13.  She has been giving the # to [place] location.  Refill sent

## 2013-10-30 NOTE — Telephone Encounter (Signed)
Per SN----  SN will still need to see her due to the pulmonary fibrosis---will need rov in 6 months for this with cxr.    i called and spoke with pt and she is aware of ct results per SN: CT Chest showed patchy airspace dis in RUL/RML anteriorly- likely radiation changes (scarring) in the lung; no tumors seen... We will continue to watch/follow this area on CXR...  Pt has already set up with Dr. Regis Bill in [place] 2015 and she also has a pending appt with SN in September 2015 to follow up with her lung issues.  Pt is aware and nothing further is needed.

## 2013-11-14 ENCOUNTER — Encounter: Payer: Self-pay | Admitting: Pulmonary Disease

## 2013-11-14 NOTE — Progress Notes (Signed)
Subjective:    Patient ID: Debra Mcintyre, female    DOB: 25-Apr-1941, 73 y.o.   MRN: [id number]  HPI 73 y/o WF here for a follow up visit... she has multiple medical problems as noted below... SEE PREV EPIC NOTES FOR OLDER DATA>>   ~  May 28, 2012:  Yearly ROV & Debra Mcintyre has been stable, no new complaints or concerns except for some paresthesia in fingers & toes that she believes is due to Lip80, and she wants to change meds... She has 5 grandchildren & the oldest grand daughter received a Lexicographer...    HBP> on MetopER100, LisinHCT20-12.5; BP= 104/68 & she denies CP, palpit, SOB, edema; we never received any data from [facility]...    Cerebrovasc Dis> on [id number] & Plavix75; s/p aneurysm & TIA; stable w/o cerebral ischemic symptoms- she has not had f/u DrDeveshwar,IR- she notes she was told no need for f/u angiography (we might consider MRI later)...    Chol> on Lip80 but she says only taking intermittently due to tingling in fingers & toes she feels is due to this- FLP showed TChol 189, TG 231, HDL 45, LDL 112; we reviewed low fat wt reducing diet, & decided to change to CRESTOR20/d...    Hypothyroid> on Synthroid 185mcg/d; TSH= 1.20& she is clinically euthyroid...    Colon polyps> last colon was 11/10 w/ one adenomatous polyp removed, f/u planned 28yrs.    DJD, FM, Osteop> on Calcium, MVI, OTC analgesics; BMD followed by her GYN...    Anxiety> on EffexorXR75 and she reports stable on this med...  We reviewed prob list, meds, xrays and labs> see below for updates >> OK Flu vaccine today... LABS 10/13:  FLP- not at goals on intermit Lip80;  Chems- wnl;  CBC- wnl;  TSH=1.20  ~  July 01, 2012:  Add-on appt for "lymph node"> pt had mammogram 8/13 at [facility] which is reported neg- no lesions identified but Ultrasound showed a right axillary lymph node; a repeat sonar done last week confirmed the right axillary adenopathy & showed no change; she has been quite anxious about this  & set up this appt to find out what is causing the LN enlargement;  She feels well, appetite good, wt stable; she denies any f/c/s/ etc; ?sm amt local hidradenitis? no other obvious adenopathy or swelling- she does have a soft tissue knot in post aspect of right shoulder that she says is unchanged from her childhood & she actually saw an orthopedist in [place] about this some yrs ago ? etiology (they left it alone, no bx etc)...     Exam reveals a sm nodule deep in right axilla, no nodules on left, no other superficial adenopathy palpated...    We discussed further eval w/ CXR (last film 8/12 was neg- NAD), and CT Chest to check the lungs, mediastinum, & the axilla; NOTE: labs 10/13 were OK...  CXR 11/13 showed norm heart size, min incr interstitial markings (no change), scoliosis...  CT Chest 11/13 showed: 1- "min prominent" right axillary LN, <2cm size, Rec f/u sonar;  2- sm nodules, largest 4-74mm in post RLL (report says LLL). Rec f/u CT 82mo;  3- <2cm cystic pancreatic lesion (they rec MRI in 93yr);  4- scoliosis w/ mild to mod spondylosis... We discussed right axillary sonar in 3 months and CT Chest w/ contrast in 6 months...  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     Recently her BP has been "up & down" per pt> on  MetopER100 & LisinHCT20-12.5; she has log of recent BP readings and most are wnl; DrMagrinat has adjusted her med by changing the LisinHCT to [place] alone & I endorse this change in meds, she will continue to monitor...  We reviewed prob list, meds, xrays and labs> see below  for updates >>   ~  March 2. 2015:  29mo ROV & Debra Mcintyre continues her regular follow up w/ DrWakefield, DrMagrinat, & DrMurray for her triple neg breast cancer; she completed XRT (right chest wall & regional LNs) ~12/14 & has been doing satis since then, DrMurray signed off; last note from DrMagrinat 12/14 reviewed- he rec increased exercise & continued Q46mo ROV... She developed back & hip pain while getting her XRT- eval by DrCaffrey w/ ?HNP, no sign of mets, given epid steroid shot which really helped...  She has some paresthesias & tried Neurontin, stopped this on her own...     HBP> on MetopER100, Lisin20; BP= 112/64 & she denies CP, palpit, SOB, edema...    Cerebrovasc Dis> on IZT245 & Plavix75; s/p aneurysm & TIA; stable w/o cerebral ischemic symptoms- she has not had f/u DrDeveshwar-IR- she notes she was told no need for f/u angiography (we might consider MRI later)...    Chol> on Cres20; FLP showed TChol 164, TG 116, HDL 49, LDL 92; rec to continue same med & diet efforts...    Hypothyroid> on Synthroid 159mcg/d; TSH= 0.35 & she is clinically euthyroid, continue same...    Colon polyps> last colon was 11/10 w/ one adenomatous polyp removed, f/u planned 42yrs.    Breast cancer> followed by DrMagrinat, DrWakefield, DrMurray w/ prev surg, chemoRx and XRT- notes reviewed...    DJD, FM, Osteop> on Calcium, MVI, OTC analgesics; BMD followed by her GYN...    Anxiety> on EffexorXR75 and she reports stable on this med...  We reviewed prob list, meds, xrays and labs> see below for updates >>   CXR 3/15 showed borderline heart size, left sided port, new RUL airsp dis (could be fibrosis from XRT), scoliosis & lower Tspine compression...  CT Chest 3/15 showed subpleural airspace consolidation and ground-glass in the anterior aspect of the right upper and right middle lobes indicative of radiation therapy; post op changes in right breast & axila; incidental 24mm nodule in RMLunchanged from 2013, no adenopathy,  atherosclerotic calcif noted...   LABS 3/15:  FLP- at goals on Cres20;  Chems- wnl;  CBC- wnl w/ Hg=12.6;  TSH=0.35 on Synthroid125...            Problem List:  RIGHT BREAST CANCER >> Invasive ductal carcinoma, triple neg tumor- Bx right ax node 2/14 & right breast after that;  Managed by DrMagrinat on ChemoRx in the neoadjuvant setting, & DrWakefield for CCS- planning surg after the chemoRx is completed in 9/14... ~  Followed by DrMagrinat, DrWakefield, DrMurray w/ prev surg, chemoRx and XRT- notes reviewed...  HYPERTENSION (ICD-401.9) > ~  on TOPROL XL 100mg /d,  LISINOPRIL/ Hct 20/12.5 daily; BP well controlled on these meds; denies HA, fatigue, visual changes, CP,  palipit, dizziness, syncope, dyspnea, edema, etc; she does water aerobics & walks for exercise...  ~  CXR 8/12 showed normal heart size, clear lungs, scoliosis, NAD... ~  10/12:  Presents w/ chest discomfort> EKG showed NSR, rate 69, rsr' in V1-2, otherw wnl; referred to Cards=> she requests DrKelly. ~  11/13:  BP= 128/82 & she remains asymptomatic... ~  7/14:  on MetopER100 & LisinHCT20-12.5; BP= 128/72 but has been volatile at home & in Oncology office prompting DrMagrinat to change to Lisin10 & stop the Hct in light of her 103 wt loss...  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 she's been stable since that time w/ out pt f/u by IR, [name]> she indicates he said no further studies needed. ~  CDopplers 5/04 showed mild plaque, no signif ICA stenoses... ~  MR studies 10/09 showed sm vessel dis; poss restenosis left MCA w/ angiogram rec- but wasn't done; mod stenosis at origin of left vertebral art w/ tortuosity... ~  Carotid  arteriogram 12/10 showed stable mild residual left middle cerebral art stenosis at site of prev angioplasty, and stable 1.5 to 4mm saccular right middle cerebral art aneurysm... ~  10/12:  she remains asymptomatic- w/o cerebral ischemic symptoms... ~  10/13:  she continues stable on ASA/ Plavix w/o cerebral ischemic symptoms... ~  3/15:  on ASA325 & Plavix75; s/p aneurysm & TIA; stable w/o cerebral ischemic symptoms- she has not had f/u DrDeveshwar-IR- she notes she was told no need for f/u angiography (we might consider MRI later)  HYPERCHOLESTEROLEMIA (ICD-272.0) >>  ~  FLP 3/08 shows Tchol 180, TG 206, HDL 49, LDL 105... rec same meds, better diet, get wt down. ~  [place] 10/09 on Lip80 showed TChol 150, TG 178, HDL 41, LDL 74 ~  FLP 1/11 on Lip80+Zetia10 showed TChol 162, TG 226, HDL 47, LDL 95... she wants to stop Zetia, get on diet & get wt down. ~  FLP 1/12 on Lip80 showed TChol 173, TG 164, HDL 45, LDL 95 ~  FLP 10/13 on Lip80 intermittently showed TChol 189, TG 231, HDL 45, LDL 112... She was c/o paresthesias that she was convinced was due to the Lip80 & she requested change to CRESTOR 20mg /d... ~  [place] 4/14 on Cres20 showed TChol 167, TG 141, HDL 46, LDL 93 ~  FLP 3/15 on Cres20 showed TChol 164, TG 116, HDL 49, LDL 92  HYPOTHYROIDISM (ICD-244.9) - on SYNTHROID 190mcg/d... ~  labs 3/08 showed TSH = 0.23 ~  labs 10/09 showed TSH= 0.64 ~  labs 1/11 showed TSH= 0.18... she wants to keep same dose to aide wt reduction. ~  labs 1/12 showed TSH= 0.20... Ditto ~  Labs 10/13 on Levo125 showed TSH= 1.20 ~  Labs 3/15 on Synthroid125 showed TSH= 0.35  COLONIC POLYPS (ICD-211.3), & Hx of HEMORRHOIDS (ICD-455.6) ~  colonoscopy 10/00 by DrDBrodie showed hems only... f/u planned 53yrs. ~  f/u colonoscopy 11/10 showed 2 polyps- one adenomatous w/ f/u planned 35yrs.  DEGENERATIVE JOINT DISEASE (ICD-715.90) - uses Tylenol & OTC meds as needed... in 2004 seen by [facility] w/ soft tissue hemangioma found  in right shoulder area... second opinion from DrWWard @  [facility] confirmed this- no surg necessary... ~  1/11:  notes some pain in hands & wrist... ~  1/12:  Stable w/o acute complaints or problem areas... ~  2014: she developed LBP & hip pain while receiving XRT for her breast cancer; eval by DrMagrinat & DrCaffrey w/ epid steroid shot that really helped...  FIBROMYALGIA (ICD-729.1)  OSTEOPOROSIS (ICD-733.00) - she indicates that this is followed & managed by GYN, DrCousins> ?when last BMD was done "she keeps up w/ this" > on Calcium, & Vitamins... ~  labs 1/11 showed Vit D level = 54...  TIA (ICD-435.9) - as above, she remains on ASA325 & PLAVIX75... last saw DrReynolds in 2006... 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 arteriogram by Dr. Fleeta Emmer confirmed a web-like plaque in the left M1 segment of the left MCA with signif stenosis...  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 subseq PTA of left MCA w/ good result- resid 20% stenosis seen on f/u angiograms along w/ a 60mm saccular aneurysm seen in the right MCA trifurcation...  ANXIETY (ICD-300.00) - on EFFEXOR 75mg /d for hot flashes, she says.... she wishes to continue the med "it keeps me on an even keel".  Health Maintenance - GYN= DrCousins & she will call for f/u... Mammograms at [facility]... BMD[facility] at [facility] & results sent to DrCousins... ~  Immunizations: she refuses Flu vaccine... given PNEUMOVAX- 1/11, and Tdap- 1/11...   Past Surgical History  Procedure Laterality Date  . Left middle cerebral artery angioplasty  2004    by [name] had 2 follow up occurances  . Portacath placement N/A 11/05/2012    Procedure: INSERTION PORT-A-CATH;  Surgeon: [name], MD;  Location: [place];  Service: General;  Laterality: N/A;  . Breast biopsy Right 10/23/2012  . Breast biopsy Right 10/08/2012  . Mastectomy w/  sentinel node biopsy Right 04/23/2013    Procedure: RIGHT TOTAL MASTECTOMY WITH SENTINEL LYMPH NODE BIOPSY;  Surgeon: [name], MD;  Location: [place];  Service: General;  Laterality: Right;    MED LIST REFLECTS ADDITIONS AND PRNs PER ONCOLOGY>>  Outpatient Encounter Prescriptions as of 10/13/2013  Medication Sig  . aspirin 325 MG tablet Take 325 mg by mouth daily.    . Calcium Carbonate-Vitamin D (CALCIUM + D PO) Take 1 tablet by mouth 2 (two) times daily.  . clopidogrel (PLAVIX) 75 MG tablet TAKE ONE TABLET BY MOUTH ONCE DAILY  . CRESTOR 20 MG tablet TAKE ONE TABLET BY MOUTH DAILY  . metoprolol succinate (TOPROL-XL) 100 MG 24 hr tablet TAKE ONE TABLET BY MOUTH ONCE DAILY  . metroNIDAZOLE (METROCREAM) 0.75 % cream Apply 1 application topically 2 (two) times daily. As directed  . Multiple Vitamin (MULTIVITAMIN) capsule Take 1 capsule by mouth daily.    Marland Kitchen SYNTHROID 125 MCG tablet TAKE ONE TABLET BY MOUTH ONCE DAILY  . UNABLE TO FIND Rx: L8000- Post Surgical Bras (Quantity: 6) P7106- Non-Silicone Breast Prosthesis (Quantity: 1) Y6948- Silicone Breast Prosthesis (Quantity: 1) Dx: 174.9; Right Mastectomy  . [DISCONTINUED] lisinopril (PRINIVIL,ZESTRIL) 20 MG tablet Take 1 tablet (20 mg total) by mouth daily.  . [DISCONTINUED] venlafaxine XR (EFFEXOR-XR) 75 MG 24 hr capsule TAKE ONE CAPSULE BY MOUTH DAILY.  . [DISCONTINUED] gabapentin (NEURONTIN) 300 MG capsule     No Known Allergies   Current Medications, Allergies, Past Medical History, Past Surgical History, Family History, and Social History were reviewed in [employer] record.    Review of  Systems    Constitutional:  Denies F/C/S, appetite is fair, weight is down 10# HEENT:  No HA, visual changes, earache, nasal symptoms, sore throat, hoarseness. Resp:  No cough, sputum, hemoptysis; +SOB w/o tightness or wheezing. Cardio:  No CP, palpit, orthopnea, edema; notes some DOE GI:  Denies N/V/D/C or blood in  stool; no reflux, abd pain, distention, or gas. GU:  No dysuria, freq, urgency, hematuria, or flank pain. MS:  Denies joint pain, swelling, tenderness; no neck pain, back pain, etc. Neuro:  No tremors, seizures, dizziness, syncope; +weakness, +numbness, +sl gait abn. Skin:  No suspicious lesions or skin rash. Heme:  No bruising, bleeding, etc; +right ax node is diminished Psyche: Denies confusion, sleep disturbance, hallucinations, anxiety, depression.   Objective:   Physical Exam      WD, WN, 73 y/o WF in NAD... GENERAL:  Alert & oriented; pleasant & cooperative... HEENT:  [place]/AT, EOM-wnl, PERRLA, EACs-clear, TMs-wnl, NOSE-clear, THROAT-clear & wnl. NECK:  Supple w/ fairROM; no JVD; normal carotid impulses w/o bruits; no thyromegaly or nodules palpated; no lymphadenopathy. CHEST:  Clear to P&A w/o wheezing, rales, or rhonchi detected... HEART:  Regular Rhythm; without murmurs/ rubs/ or gallops. ABDOMEN:  Soft & nontender; normal bowel sounds; no organomegaly or masses palpated. EXT: without deformities, mild arthritic changes; no varicose veins/ +venous insuffic/ no edema. NEURO:  CN's intact;  no focal neuro deficits... DERM:  No lesions noted; no rash etc...  RADIOLOGY DATA:  Reviewed in the EPIC EMR & discussed w/ the patient...  LABORATORY DATA:  Reviewed in the EPIC EMR & discussed w/ the patient...   Assessment & Plan:    RIGHT BREAST CANCER>> invasive ductal carcinoma, triple negative tumor, s/p Surg by DrWakefield, ChemoRx per DrMagrinat, XRT per DrMurray- their notes are reviewed...   ASTHMATIC BRONCHITIS>  No recent URIs or resp exac & she is once again not on regular meds, denies breathing problems, etc...  HBP>  Controlled on BBlocker, & ACE (DrM stopped the diuretic recently), continue same...  Cerebrovasc Dis>  She had TIA 2004 w/ eval revealing left MCA stenosis; DrDeveshwar did PTA w/ good result; incidental 1-34mm aneurysm noted in the right MCA; she has remained  asymptomatic since 2004 on the ASA/ Plavix.  CHOL>  Controlled on Cres20 (she was intol to Lip80)...  HPYOTHYROID>  On Synthroid 132mcg/d w/ TSH is OK, continue same...  Other medical problems as noted...   Patient's Medications  New Prescriptions   UNABLE TO FIND    Rx: B7628- Silicone Breast Prosthesis (Quantity: 1) **Replacement Dx: 174.9; Right mastectomy  Previous Medications   ASPIRIN 325 MG TABLET    Take 325 mg by mouth daily.     CALCIUM CARBONATE-VITAMIN D (CALCIUM + D PO)    Take 1 tablet by mouth 2 (two) times daily.   CLOPIDOGREL (PLAVIX) 75 MG TABLET    TAKE ONE TABLET BY MOUTH ONCE DAILY   CRESTOR 20 MG TABLET    TAKE ONE TABLET BY MOUTH DAILY   METOPROLOL SUCCINATE (TOPROL-XL) 100 MG 24 HR TABLET    TAKE ONE TABLET BY MOUTH ONCE DAILY   METRONIDAZOLE (METROCREAM) 0.75 % CREAM    Apply 1 application topically 2 (two) times daily. As directed   MULTIPLE VITAMIN (MULTIVITAMIN) CAPSULE    Take 1 capsule by mouth daily.     SYNTHROID 125 MCG TABLET    TAKE ONE TABLET BY MOUTH ONCE DAILY   UNABLE TO FIND    Rx: L8000- Post Surgical Bras (Quantity: 6)  Q5956- Non-Silicone Breast Prosthesis (Quantity: 1) L8756- Silicone Breast Prosthesis (Quantity: 1) Dx: 174.9; Right Mastectomy  Modified Medications   Modified Medication Previous Medication   LISINOPRIL (PRINIVIL,ZESTRIL) 20 MG TABLET lisinopril (PRINIVIL,ZESTRIL) 20 MG tablet      TAKE ONE TABLET BY MOUTH ONCE DAILY    TAKE ONE TABLET BY MOUTH ONCE DAILY   VENLAFAXINE XR (EFFEXOR-XR) 75 MG 24 HR CAPSULE venlafaxine XR (EFFEXOR-XR) 75 MG 24 hr capsule      TAKE ONE CAPSULE BY MOUTH ONCE DAILY    TAKE ONE CAPSULE BY MOUTH DAILY.  Discontinued Medications   GABAPENTIN (NEURONTIN) 300 MG CAPSULE       LISINOPRIL (PRINIVIL,ZESTRIL) 20 MG TABLET    Take 1 tablet (20 mg total) by mouth daily.

## 2013-11-17 ENCOUNTER — Telehealth: Payer: Self-pay | Admitting: Oncology

## 2013-11-17 ENCOUNTER — Other Ambulatory Visit (HOSPITAL_BASED_OUTPATIENT_CLINIC_OR_DEPARTMENT_OTHER): Payer: Medicare Other

## 2013-11-17 ENCOUNTER — Ambulatory Visit (HOSPITAL_BASED_OUTPATIENT_CLINIC_OR_DEPARTMENT_OTHER): Payer: Medicare Other | Admitting: Oncology

## 2013-11-17 VITALS — BP 98/61 | HR 80 | Temp 98.0°F | Resp 18 | Ht 69.0 in | Wt 152.0 lb

## 2013-11-17 DIAGNOSIS — C773 Secondary and unspecified malignant neoplasm of axilla and upper limb lymph nodes: Secondary | ICD-10-CM

## 2013-11-17 DIAGNOSIS — Z171 Estrogen receptor negative status [ER-]: Secondary | ICD-10-CM | POA: Diagnosis not present

## 2013-11-17 DIAGNOSIS — C50919 Malignant neoplasm of unspecified site of unspecified female breast: Secondary | ICD-10-CM

## 2013-11-17 DIAGNOSIS — C50119 Malignant neoplasm of central portion of unspecified female breast: Secondary | ICD-10-CM | POA: Diagnosis not present

## 2013-11-17 DIAGNOSIS — C50411 Malignant neoplasm of upper-outer quadrant of right female breast: Secondary | ICD-10-CM

## 2013-11-17 LAB — CBC WITH DIFFERENTIAL/PLATELET
BASO%: 0.5 % (ref 0.0–2.0)
BASOS ABS: 0 10*3/uL (ref 0.0–0.1)
EOS%: 2.3 % (ref 0.0–7.0)
Eosinophils Absolute: 0.2 10*3/uL (ref 0.0–0.5)
HEMATOCRIT: 35.7 % (ref 34.8–46.6)
HEMOGLOBIN: 12 g/dL (ref 11.6–15.9)
LYMPH#: 1.1 10*3/uL (ref 0.9–3.3)
LYMPH%: 17.3 % (ref 14.0–49.7)
MCH: 29.1 pg (ref 25.1–34.0)
MCHC: 33.6 g/dL (ref 31.5–36.0)
MCV: 86.7 fL (ref 79.5–101.0)
MONO#: 0.8 10*3/uL (ref 0.1–0.9)
MONO%: 11.8 % (ref 0.0–14.0)
NEUT#: 4.4 10*3/uL (ref 1.5–6.5)
NEUT%: 68.1 % (ref 38.4–76.8)
PLATELETS: 210 10*3/uL (ref 145–400)
RBC: 4.12 10*6/uL (ref 3.70–5.45)
RDW: 12.7 % (ref 11.2–14.5)
WBC: 6.5 10*3/uL (ref 3.9–10.3)
nRBC: 0 % (ref 0–0)

## 2013-11-17 NOTE — Telephone Encounter (Signed)
, °

## 2013-11-17 NOTE — Progress Notes (Signed)
ID: [name]   DOB: 06/25/1941  MR#: [id number]  EHU#:[id number]  PCP: [name], MD GYN: [name] SU: [name] OTHER MD: Arloa Koh, [name], Lillia Mountain, Clydell Hakim  HISTORY OF PRESENT ILLNESS: Elide had screening mammography at [facility] 04/10/2012 raising the question of some axillary lymph nodes on the right. Additional views of the right axilla and right axillary ultrasound performed 04/04/2012 showed 3 lymph nodes that appeared larger than prior. The largest measured 1.5 cm. 2 of them had cortical thickening. Close followup was suggested, and repeat right axillary ultrasound 06/25/2012 showed no significant change in the lymph nodes in question. Followup ultrasound in 3 months was recommended, but in the interim the patient saw her primary physician, [name] and ADL, and he set her up for a chest CT on 07/08/2012, which showed a mild asymmetric density in the upper mid right breast. There was a prominent right axillary lymph node measuring 1.9 cm, with a few smaller adjacent nodes asymmetric with compared to the left side. Again, short interval followup was recommended, and on 10/07/2012 the patient had digital right mammography and ultrasonography, now at the breast Center. Dr. [name] noted pleomorphic calcifications spanning an area of 6.3 cm without associated mass. Physical exam was unremarkable. Ultrasound showed an area of adenopathy in the right axilla measuring 1.6 cm, with a second lymph node with a thickened cortex measuring 1.2 cm. No suspicious mass was seen by ultrasound in the right breast.  Biopsy of the larger axillary lymph node on 10/07/2012 showed ([id number]) an invasive ductal carcinoma, triple negative, with an MIB-1 of 66%. Biopsy of the right breast the next day, [id number]) showed invasive ductal carcinoma, grade 3. Breast MRI 10/14/2012 showed a total area of irregular enhancement in the right breast measuring up to 12 cm. MRI guided  biopsy of an area in the upper inner quadrant of the right breast on 10/23/2012 showed ([id number]) ductal carcinoma in situ, with foci worrisome for invasion.  The patient's subsequent history is as detailed below  INTERVAL HISTORY: Che returns today for followup of her locally advanced right breast cancer accompanied by her husband Milta Deiters. S in the interval since her last visit here she completed her radiation treatments. She is still somewhat fatigued from that, although she at least walks her dog and does all her activities of daily living such as housework and driving. She is planning to start a water aerobics program at the wire through "[employer]".  REVIEW OF SYSTEMS: She thinks she needs a new prescription in her glasses. Sometimes she has pain in the mastectomy area particularly in the right armpit area. This is very intermittent. It is also, as she understands, related to surgery and radiation I'm not too breast cancer itself. She has a pinched nerve in her back which is occasionally "full". She takes gabapentin now for that with good results. Otherwise a detailed review of systems today was noncontributory  PAST MEDICAL HISTORY: Past Medical History  Diagnosis Date  . Cerebrovascular disease, unspecified   . Cerebral aneurysm, nonruptured   . Pure hypercholesterolemia   . Hypothyroidism   . Colonic polyp   . History of chemotherapy   . TIA (transient ischemic attack)   . Hypertension     Does not see a cardiologist  . Neuropathy   . Breast cancer 09/2012    right breast/right axillary lymph node t2,pn1, stage 11b, invasive ductal carcinma, grade 3, triple negative, with an mib-1 of 15%  .  Hx of radiation therapy 05/27/13-08/13/13    right chest wall/right supraclavicular/axillary region 5220 cGy 29 sessions, right mastectomy/chest wall boost cGy 5 sessions    PAST SURGICAL HISTORY: Past Surgical History  Procedure Laterality Date  . Left middle cerebral artery  angioplasty  2004    by [name] had 2 follow up occurances  . Portacath placement N/A 11/05/2012    Procedure: INSERTION PORT-A-CATH;  Surgeon: [name], MD;  Location: [place];  Service: General;  Laterality: N/A;  . Breast biopsy Right 10/23/2012  . Breast biopsy Right 10/08/2012  . Mastectomy w/ sentinel node biopsy Right 04/23/2013    Procedure: RIGHT TOTAL MASTECTOMY WITH SENTINEL LYMPH NODE BIOPSY;  Surgeon: [name], MD;  Location: [place];  Service: General;  Laterality: Right;    FAMILY HISTORY Family History  Problem Relation Age of Onset  . Lung cancer Mother 18  . Heart disease Father   . Colon cancer Brother 42  . Alcohol abuse Brother   . Colon cancer Maternal Uncle     dx in his 14s  . Cancer Maternal Aunt     unknown type  . Ovarian cancer Other 22   the patient's father died at the age of 63. The patient's mother died at the age of 74 from lung cancer. She was not a smoker. The cancer was diagnosed when she was 73 years old. The patient's mother had 2 sisters and one brother. One of those sisters had lung cancer and a brother had colon cancer, both diagnosed in their 74s. The patient herself has one brother who died from colon cancer at the age of 14. That brother has a daughter who was diagnosed with uterine cancer at the age of 12. All this raises the question of possible Lynch syndrome and I have referred the patient for genetics counseling.  GYNECOLOGIC HISTORY: Menarche age 55, menopause around 51. The patient is GX P0. She took birth control pills for many years with no complications.  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 Her stepchildren are [name] who lives in "[place]" and is financial vice president of [employer], and Walton who lives in [place] and works as a Tree surgeon. The patient has 5 grandchildren. She is  currently not a church attender   ADVANCED DIRECTIVES: Not in place; this was discussed 10/29/2012 and the patient was advised to complete a healthcare power of attorney document  HEALTH MAINTENANCE: History  Substance Use Topics  . Smoking status: Former Smoker -- 1.00 packs/day for 20 years    Types: Cigarettes    Quit date: 04/02/1986  . Smokeless tobacco: Never Used  . Alcohol Use: No     Colonoscopy: 2012  PAP: 2012  Bone density:  Lipid panel:  No Known Allergies  Current Outpatient Prescriptions  Medication Sig Dispense Refill  . aspirin 325 MG tablet Take 325 mg by mouth daily.        . Calcium Carbonate-Vitamin D (CALCIUM + D PO) Take 1 tablet by mouth 2 (two) times daily.      . clopidogrel (PLAVIX) 75 MG tablet TAKE ONE TABLET BY MOUTH ONCE DAILY  30 tablet  6  . CRESTOR 20 MG tablet TAKE ONE TABLET BY MOUTH DAILY  30 tablet  6  . lisinopril (PRINIVIL,ZESTRIL) 20 MG tablet TAKE ONE TABLET BY MOUTH ONCE DAILY  30 tablet  6  . metoprolol  succinate (TOPROL-XL) 100 MG 24 hr tablet TAKE ONE TABLET BY MOUTH ONCE DAILY  30 tablet  6  . metroNIDAZOLE (METROCREAM) 0.75 % cream Apply 1 application topically 2 (two) times daily. As directed      . Multiple Vitamin (MULTIVITAMIN) capsule Take 1 capsule by mouth daily.        Marland Kitchen SYNTHROID 125 MCG tablet TAKE ONE TABLET BY MOUTH ONCE DAILY  30 tablet  6  . UNABLE TO FIND Rx: L8000- Post Surgical Bras (Quantity: 6) T2458- Non-Silicone Breast Prosthesis (Quantity: 1) K9983- Silicone Breast Prosthesis (Quantity: 1) Dx: 174.9; Right Mastectomy  1 each  0  . UNABLE TO FIND Rx: J8250- Silicone Breast Prosthesis (Quantity: 1) **Replacement Dx: 174.9; Right mastectomy  1 each  0  . venlafaxine XR (EFFEXOR-XR) 75 MG 24 hr capsule TAKE ONE CAPSULE BY MOUTH ONCE DAILY  30 capsule  3   No current facility-administered medications for this visit.    OBJECTIVE: Middle-aged white woman in no acute distress Filed Vitals:   11/17/13 1506  BP:  98/61  Pulse: 80  Temp: 98 F (36.7 C)  Resp: 18     Body mass index is 22.44 kg/(m^2).    ECOG FS: 1 Filed Weights   11/17/13 1506  Weight: 152 lb (68.947 kg)   Sclerae unicteric, pupils equal and reactive Oropharynx clear and moist; teeth in good repair No cervical or supraclavicular adenopathy Lungs no rales or rhonchi Heart regular rate and rhythm Abd soft, nontender, positive bowel sounds MSK no focal spinal tenderness to moderate palpation, no upper extremity lymphedema Neuro: nonfocal, well oriented, positive affect Breasts: The right breast is status post mastectomy and chest wall radiation. There is no evidence of local recurrence. The right axilla is benign. The left breast is unremarkable  LAB RESULTS: Lab Results  Component Value Date   WBC 6.5 11/17/2013   NEUTROABS 4.4 11/17/2013   HGB 12.0 11/17/2013   HCT 35.7 11/17/2013   MCV 86.7 11/17/2013   PLT 210 11/17/2013      Chemistry      Component Value Date/Time   NA 141 10/20/2013 1007   NA 139 05/13/2013 1134   K 4.0 10/20/2013 1007   K 4.0 05/13/2013 1134   CL 105 10/20/2013 1007   CL 102 01/28/2013 0909   CO2 27 10/20/2013 1007   CO2 25 05/13/2013 1134   BUN 15 10/20/2013 1007   BUN 11.5 05/13/2013 1134   CREATININE 0.6 10/20/2013 1007   CREATININE 0.7 05/13/2013 1134      Component Value Date/Time   CALCIUM 9.8 10/20/2013 1007   CALCIUM 9.9 05/13/2013 1134   ALKPHOS 60 10/20/2013 1007   ALKPHOS 65 05/13/2013 1134   AST 21 10/20/2013 1007   AST 17 05/13/2013 1134   ALT 18 10/20/2013 1007   ALT 12 05/13/2013 1134   BILITOT 1.0 10/20/2013 1007   BILITOT 0.69 05/13/2013 1134       STUDIES: Mr Hip Left Wo Contrast  07/02/2013   CLINICAL DATA:  Left hip pain for 3 weeks. History of breast cancer diagnosed over, 2014.  EXAM: MRI OF THE LEFT HIP WITHOUT CONTRAST  TECHNIQUE: Multiplanar, multisequence MR imaging was performed. No intravenous contrast was administered.  COMPARISON:  PET CT scan 10/24/2012. CT abdomen and pelvis 06/22/2008.  MRI lumbar spine 06/14/2013.  FINDINGS: There is no bone marrow signal abnormality to suggest metastatic disease. Both hips are located. No fracture or avascular necrosis is present. There is no hip joint  effusion. No evidence of bursitis is identified. No notable degenerative change is seen about the hips. Lower lumbar spondylosis is identified as described on report of the comparison MRI. Imaged intrapelvic contents demonstrate no focal abnormality.  IMPRESSION: No finding to explain the patient's left hip pain. Negative for metastatic disease.  Lower lumbar spondylosis as seen on comparison lumbar spine MRI.   Electronically Signed   By: Inge Rise M.D.   On: 07/02/2013 12:42    ASSESSMENT: 73 y.o. [place] woman   (1)  status post right breast and right axillary lymph node biopsies February 2014 of a clinical T2, pN1, stage IIB  invasive ductal carcinoma, grade 3, triple negative, with an MIB-1 of 15%  (2)  a third biopsy from a different quadrant 10/23/2012 showed ductal carcinoma in situ  (3)  treated in the neoadjuvant setting with 4 dose dense cycles of doxorubicin/ cyclophosphamide 12/23/2012, followed by 7 weekly doses of paclitaxel and completed 03/03/2013.  (4) status post right mastectomy and sentinel lymph node sampling 04/23/2013 for a residual pT1b pN1 invasive ductal carcinoma, grade 3, again triple negative [2/10 sample lymph nodes were involved by tumor, both with micrometastatic deposits]  (5) adjuvant radiation scheduled completed 08/13/2013  (6) the patient's two young nieces have been tested genetically and are both negative for BRCA mutations; no further testing is planned   PLAN:  Meagen is doing well as far as her breast cancer is concerned. He is particularly reassuring that her chest CT was normal, except of course for the areas of radiation change. That study will service as a useful baseline in the future.  She has not yet resumed a full exercise program,  though she is walking her dog. She is planning to do some water aerobics. I gave her a copy of the [employer] pamphlet so she can consider participating in that program, which I think will E. helpful to her.  She will be meeting with Dr. Donne Hazel soon to begin discussing reconstruction. This is likely to keep her busy in the next few months. Accordingly she will see me again in 6 months from now. She knows to call for any problems that may develop before that visit.  Chauncey Cruel, MD    11/17/2013

## 2013-11-19 IMAGING — CT NM PET TUM IMG INITIAL (PI) SKULL BASE T - THIGH
1 of 5 series · 1 of 25 positions shown · IV contrast (350 OM)
Comparison: CT chest dated 07/08/2012

CLINICAL DATA: Initial treatment strategy for newly diagnosed right
breast cancer.

NUCLEAR MEDICINE PET SKULL BASE TO THIGH
Fasting Blood Glucose:  89
TECHNIQUE: 17.6 mCi F-18 FDG was injected intravenously. CT data
was obtained and used for attenuation correction and anatomic
localization only.  (This was not acquired as a diagnostic CT
examination.) Additional exam technical data entered on
technologist worksheet.

[Series 2: ct images · axial · 3.8mm · 0.98mm/px · 1 of 263 slices shown]
[im 263/263  brain]
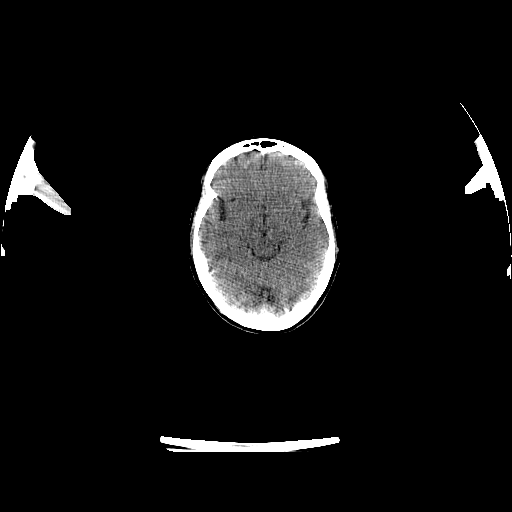

[1 of 25 positions shown; findings below may reference images not displayed]

FINDINGS: Neck: No hypermetabolic lymph nodes in the neck.

Chest:  [DATE] x 2.7 cm upper central right breast mass (series
2/image 85), max SUV 4.6, corresponding to the known right breast
cancer.

10 mm short-axis right axillary node (series 2/image 76), max SUV
2.4.  Additional 7 mm short-axis right axillary node superiorly
(series 2/image 62), max SUV 2.5.

No hypermetabolic mediastinal or hilar nodes.

No suspicious pulmonary nodules on the CT scan.

Coronary atherosclerosis.  Atherosclerotic calcifications of the
aortic arch.

Abdomen/Pelvis:  No abnormal hypermetabolic activity within the
liver, pancreas, adrenal glands, or spleen.

No hypermetabolic lymph nodes in the abdomen or pelvis.

Skeleton:  No focal hypermetabolic activity to suggest skeletal
metastasis.
IMPRESSION: 2.7 cm upper central right breast mass, max SUV 4.6, corresponding
to known right breast cancer.

Two hypermetabolic right axillary lymph nodes measuring up to 10 mm
short axis, max SUV 2.5.

No evidence of distant metastases.

## 2013-11-26 ENCOUNTER — Encounter (INDEPENDENT_AMBULATORY_CARE_PROVIDER_SITE_OTHER): Payer: Medicare Other | Admitting: General Surgery

## 2013-12-12 ENCOUNTER — Ambulatory Visit (INDEPENDENT_AMBULATORY_CARE_PROVIDER_SITE_OTHER): Payer: Medicare Other | Admitting: General Surgery

## 2013-12-12 ENCOUNTER — Encounter (INDEPENDENT_AMBULATORY_CARE_PROVIDER_SITE_OTHER): Payer: Self-pay | Admitting: General Surgery

## 2013-12-12 VITALS — BP 128/78 | HR 74 | Temp 97.4°F | Resp 14 | Ht 67.0 in | Wt 148.6 lb

## 2013-12-12 DIAGNOSIS — Z09 Encounter for follow-up examination after completed treatment for conditions other than malignant neoplasm: Secondary | ICD-10-CM

## 2013-12-12 NOTE — Progress Notes (Signed)
Subjective:     Patient ID: Debra Mcintyre, female   DOB: 10/13/1940, 73 y.o.   MRN: [id number]  HPI This is a 73 year old female who I know well for treatment for right breast cancer which ended up being a right modified radical mastectomy. She was treated with radiotherapy postoperatively as well. She returns today after having her port removed with no complaints. She has an occasional soreness on her right side. She is otherwise well and wants to be seen for reconstruction.  Review of Systems     Objective:   Physical Exam Port site clean without infection     Assessment:     S/p port removal     Plan:     She has done well after having her port removed. She can return to full activity. I gave her a referral to see plastic surgery for evaluation for reconstruction. I will see her back in one year she is due to see medical oncology in 6 months.

## 2013-12-23 DIAGNOSIS — Z853 Personal history of malignant neoplasm of breast: Secondary | ICD-10-CM | POA: Diagnosis not present

## 2014-01-16 ENCOUNTER — Other Ambulatory Visit: Payer: Self-pay | Admitting: Pulmonary Disease

## 2014-01-19 DIAGNOSIS — H2510 Age-related nuclear cataract, unspecified eye: Secondary | ICD-10-CM | POA: Diagnosis not present

## 2014-01-21 ENCOUNTER — Ambulatory Visit
Admission: RE | Admit: 2014-01-21 | Discharge: 2014-01-21 | Disposition: A | Discharge status: Home / Self Care | Payer: Medicare Other | Source: Ambulatory Visit | Attending: Oncology | Admitting: Oncology

## 2014-01-21 ENCOUNTER — Telehealth: Payer: Self-pay | Admitting: Pulmonary Disease

## 2014-01-21 ENCOUNTER — Other Ambulatory Visit: Payer: Self-pay | Admitting: Oncology

## 2014-01-21 DIAGNOSIS — Z1231 Encounter for screening mammogram for malignant neoplasm of breast: Secondary | ICD-10-CM

## 2014-01-21 DIAGNOSIS — Z9011 Acquired absence of right breast and nipple: Secondary | ICD-10-CM

## 2014-01-21 DIAGNOSIS — Z853 Personal history of malignant neoplasm of breast: Secondary | ICD-10-CM | POA: Diagnosis not present

## 2014-01-21 NOTE — Telephone Encounter (Signed)
Refills have been sent to the pharmacy for the pt and the pt is aware.  Nothing further is needed.

## 2014-01-21 NOTE — Telephone Encounter (Signed)
[id number] x1 

## 2014-02-11 DIAGNOSIS — Z1283 Encounter for screening for malignant neoplasm of skin: Secondary | ICD-10-CM | POA: Diagnosis not present

## 2014-02-11 DIAGNOSIS — L821 Other seborrheic keratosis: Secondary | ICD-10-CM | POA: Diagnosis not present

## 2014-02-11 DIAGNOSIS — D485 Neoplasm of uncertain behavior of skin: Secondary | ICD-10-CM | POA: Diagnosis not present

## 2014-02-11 DIAGNOSIS — D235 Other benign neoplasm of skin of trunk: Secondary | ICD-10-CM | POA: Diagnosis not present

## 2014-02-17 ENCOUNTER — Other Ambulatory Visit: Payer: Self-pay | Admitting: Pulmonary Disease

## 2014-02-26 ENCOUNTER — Other Ambulatory Visit (HOSPITAL_COMMUNITY): Payer: Medicare Other

## 2014-03-19 ENCOUNTER — Inpatient Hospital Stay (HOSPITAL_COMMUNITY): Admission: RE | Admit: 2014-03-19 | Payer: Medicare Other | Source: Ambulatory Visit | Admitting: Plastic Surgery

## 2014-03-19 ENCOUNTER — Encounter (HOSPITAL_COMMUNITY): Admission: RE | Payer: Self-pay | Source: Ambulatory Visit

## 2014-03-19 SURGERY — RECONSTRUCTION, BREAST, USING LATISSIMUS DORSI MYOCUTANEOUS FLAP
Anesthesia: General | Site: Breast | Laterality: Right

## 2014-03-22 ENCOUNTER — Other Ambulatory Visit: Payer: Self-pay | Admitting: Pulmonary Disease

## 2014-04-15 ENCOUNTER — Ambulatory Visit: Payer: Medicare Other | Admitting: Pulmonary Disease

## 2014-04-16 ENCOUNTER — Ambulatory Visit (INDEPENDENT_AMBULATORY_CARE_PROVIDER_SITE_OTHER): Payer: Medicare Other | Admitting: Pulmonary Disease

## 2014-04-16 ENCOUNTER — Ambulatory Visit (INDEPENDENT_AMBULATORY_CARE_PROVIDER_SITE_OTHER)
Admission: RE | Admit: 2014-04-16 | Discharge: 2014-04-16 | Disposition: A | Discharge status: Home / Self Care | Payer: Medicare Other | Source: Ambulatory Visit | Attending: Pulmonary Disease | Admitting: Pulmonary Disease

## 2014-04-16 ENCOUNTER — Encounter: Payer: Self-pay | Admitting: Pulmonary Disease

## 2014-04-16 VITALS — BP 122/68 | HR 76 | Temp 97.9°F | Ht 68.0 in | Wt 151.4 lb

## 2014-04-16 DIAGNOSIS — G459 Transient cerebral ischemic attack, unspecified: Secondary | ICD-10-CM

## 2014-04-16 DIAGNOSIS — E039 Hypothyroidism, unspecified: Secondary | ICD-10-CM

## 2014-04-16 DIAGNOSIS — I679 Cerebrovascular disease, unspecified: Secondary | ICD-10-CM

## 2014-04-16 DIAGNOSIS — E7800 Pure hypercholesterolemia, unspecified: Secondary | ICD-10-CM

## 2014-04-16 DIAGNOSIS — J45909 Unspecified asthma, uncomplicated: Secondary | ICD-10-CM

## 2014-04-16 DIAGNOSIS — C773 Secondary and unspecified malignant neoplasm of axilla and upper limb lymph nodes: Secondary | ICD-10-CM

## 2014-04-16 DIAGNOSIS — C50919 Malignant neoplasm of unspecified site of unspecified female breast: Secondary | ICD-10-CM | POA: Diagnosis not present

## 2014-04-16 DIAGNOSIS — Z853 Personal history of malignant neoplasm of breast: Secondary | ICD-10-CM | POA: Diagnosis not present

## 2014-04-16 DIAGNOSIS — C50911 Malignant neoplasm of unspecified site of right female breast: Secondary | ICD-10-CM

## 2014-04-16 DIAGNOSIS — J4520 Mild intermittent asthma, uncomplicated: Secondary | ICD-10-CM

## 2014-04-16 DIAGNOSIS — I10 Essential (primary) hypertension: Secondary | ICD-10-CM | POA: Diagnosis not present

## 2014-04-16 NOTE — Progress Notes (Signed)
Subjective:    Patient ID: Debra Mcintyre, female    DOB: [birth year], 73 y.o.   MRN: [id number]  HPI 73 y/o WF here for a follow up visit... she has multiple medical problems as noted below... SEE PREV EPIC NOTES FOR OLDER DATA>>   ~  May 28, 2012:  Yearly ROV & Debra Mcintyre has been stable, no new complaints or concerns except for some paresthesia in fingers & toes that she believes is due to Lip80, and she wants to change meds... She has 5 grandchildren & the oldest grand daughter received a Lexicographer...    HBP> on MetopER100, LisinHCT20-12.5; BP= 104/68 & she denies CP, palpit, SOB, edema; we never received any data from [facility]...    Cerebrovasc Dis> on [id number] & Plavix75; s/p aneurysm & TIA; stable w/o cerebral ischemic symptoms- she has not had f/u DrDeveshwar,IR- she notes she was told no need for f/u angiography (we might consider MRI later)...    Chol> on Lip80 but she says only taking intermittently due to tingling in fingers & toes she feels is due to this- FLP showed TChol 189, TG 231, HDL 45, LDL 112; we reviewed low fat wt reducing diet, & decided to change to CRESTOR20/d...    Hypothyroid> on Synthroid 170mcg/d; TSH= 1.20& she is clinically euthyroid...    Colon polyps> last colon was 11/10 w/ one adenomatous polyp removed, f/u planned 46yrs.    DJD, FM, Osteop> on Calcium, MVI, OTC analgesics; BMD followed by her GYN...    Anxiety> on EffexorXR75 and she reports stable on this med...  We reviewed prob list, meds, xrays and labs> see below for updates >> OK Flu vaccine today... LABS 10/13:  FLP- not at goals on intermit Lip80;  Chems- wnl;  CBC- wnl;  TSH=1.20  ~  July 01, 2012:  Add-on appt for "lymph node"> pt had mammogram 8/13 at [facility] which is reported neg- no lesions identified but Ultrasound showed a right axillary lymph node; a repeat sonar done last week confirmed the right axillary adenopathy & showed no change; she has been quite anxious about this  & set up this appt to find out what is causing the LN enlargement;  She feels well, appetite good, wt stable; she denies any f/c/s/ etc; ?sm amt local hidradenitis? no other obvious adenopathy or swelling- she does have a soft tissue knot in post aspect of right shoulder that she says is unchanged from her childhood & she actually saw an orthopedist in [place] about this some yrs ago ? etiology (they left it alone, no bx etc)...     Exam reveals a sm nodule deep in right axilla, no nodules on left, no other superficial adenopathy palpated...    We discussed further eval w/ CXR (last film 8/12 was neg- NAD), and CT Chest to check the lungs, mediastinum, & the axilla; NOTE: labs 10/13 were OK...  CXR 11/13 showed norm heart size, min incr interstitial markings (no change), scoliosis...  CT Chest 11/13 showed: 1- "min prominent" right axillary LN, <2cm size, Rec f/u sonar;  2- sm nodules, largest 4-95mm in post RLL (report says LLL). Rec f/u CT 19mo;  3- <2cm cystic pancreatic lesion (they rec MRI in 84yr);  4- scoliosis w/ mild to mod spondylosis... We discussed right axillary sonar in 3 months and CT Chest w/ contrast in 6 months...  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     Recently her BP has been "up & down" per pt> on  MetopER100 & LisinHCT20-12.5; she has log of recent BP readings and most are wnl; DrMagrinat has adjusted her med by changing the LisinHCT to [place] alone & I endorse this change in meds, she will continue to monitor...  We reviewed prob list, meds, xrays and labs> see below  for updates >>   ~  March 2. 2015:  12mo ROV & Debra Mcintyre continues her regular follow up w/ DrWakefield, DrMagrinat, & DrMurray for her triple neg breast cancer; she completed XRT (right chest wall & regional LNs) ~12/14 & has been doing satis since then, DrMurray signed off; last note from DrMagrinat 12/14 reviewed- he rec increased exercise & continued Q18mo ROV... She developed back & hip pain while getting her XRT- eval by DrCaffrey w/ ?HNP, no sign of mets, given epid steroid shot which really helped...  She has some paresthesias & tried Neurontin, stopped this on her own...     HBP> on MetopER100, Lisin20; BP= 112/64 & she denies CP, palpit, SOB, edema...    Cerebrovasc Dis> on KDX833 & Plavix75; s/p aneurysm & TIA; stable w/o cerebral ischemic symptoms- she has not had f/u DrDeveshwar-IR- she notes she was told no need for f/u angiography (we might consider MRI later)...    Chol> on Cres20; FLP showed TChol 164, TG 116, HDL 49, LDL 92; rec to continue same med & diet efforts...    Hypothyroid> on Synthroid 124mcg/d; TSH= 0.35 & she is clinically euthyroid, continue same...    Colon polyps> last colon was 11/10 w/ one adenomatous polyp removed, f/u planned 2yrs.    Breast cancer> followed by DrMagrinat, DrWakefield, DrMurray w/ prev surg, chemoRx and XRT- notes reviewed...    DJD, FM, Osteop> on Calcium, MVI, OTC analgesics; BMD followed by her GYN...    Anxiety> on EffexorXR75 and she reports stable on this med...  We reviewed prob list, meds, xrays and labs> see below for updates >>   CXR 3/15 showed borderline heart size, left sided port, new RUL airsp dis (could be fibrosis from XRT), scoliosis & lower Tspine compression...  CT Chest 3/15 showed subpleural airspace consolidation and ground-glass in the anterior aspect of the right upper and right middle lobes indicative of radiation therapy; post op changes in right breast & axila; incidental 21mm nodule in RMLunchanged from 2013, no adenopathy,  atherosclerotic calcif noted...   LABS 3/15:  FLP- at goals on Cres20;  Chems- wnl;  CBC- wnl w/ Hg=12.6;  TSH=0.35 on Synthroid125...   ~  April 16, 2014:  35mo ROV & Debra Mcintyre reports a good interval- she informs me that she has decided against reconstruction & is very comfortable w/ her decision;  She notes some fall allergies & is using Zyrtek prn;  She gets exercise by walking the dog & will join a Y program soon;  She has had f/u visits w/ DrSanger, DrWakefield, & DrMagrinat in the interval- stable & doing satis, f/u left mammogram 6/15 (she is s/p right mastectomy) and it was neg... We reviewed the following medical problems during today's office visit >>  AbnCXR> CXR 3/15 showed new RUL airspace dis & CTChest 3/15 revealed GG opacities prob related to radiation therapy; incidental 58mm nodule in RML w/o ch from 2013, atherosclerotic calcif, no adenopathy; due for f/u film today=> improved...    HBP> on MetopER100, Lisin20; BP= 122/68 & she denies CP, palpit, SOB, edema; she wants to decr the MeptopER to 50mg /d- ok & watch BP at home...    Cerebrovasc Dis> on OFB510 & Plavix75; s/p aneurysm & TIA; stable w/o cerebral ischemic symptoms- she has not had f/u DrDeveshwar,IR- she notes she was told no need for f/u angiography (we might consider MRI later)...    Chol> on Cres20; FLP 3/15 showed TChol 164, TG 116, HDL 49, LDL 92; rec to continue same med & diet efforts...    Hypothyroid> on Synthroid 185mcg/d; labs 3/15 showed TSH= 0.35 & she is clinically euthyroid, continue same...    Colon polyps> last colon was 11/10 w/ one adenomatous polyp removed, f/u planned 77yrs.    Breast cancer> followed by DrMagrinat, DrWakefield, DrMurray w/ prev surg, chemoRx and XRT- notes reviewed; she has decided against reconstruction...    DJD, FM, Osteop> on Calcium, MVI, OTC analgesics; BMD followed by her GYN...    Anxiety> on EffexorXR75 and she reports stable on this med...  We reviewed prob list, meds, xrays  and labs> see below for updates >>   CXR 9/15 showed no infiltrate, opacity, etc; scoliosis & stable compression deformity; port has been removed...           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 Marland Kitchen.. ~  Followed by DrMagrinat, DrWakefield, DrMurray w/ prev surg, chemoRx and XRT- notes reviewed...  HYPERTENSION (ICD-401.9) > ~  on TOPROL XL 100mg /d,  LISINOPRIL/ Hct 20/12.5 daily; BP well controlled on these meds; denies HA, fatigue, visual changes, CP, palipit, dizziness, syncope, dyspnea, edema, etc; she does water aerobics & walks for exercise...  ~  CXR 8/12 showed normal heart size, clear lungs, scoliosis, NAD... ~  10/12:  Presents w/ chest discomfort> EKG showed NSR, rate 69, rsr' in V1-2, otherw wnl; referred to Cards=> she requests DrKelly. ~  11/13:  BP= 128/82 & she remains asymptomatic... ~  7/14: on MetopER100 & LisinHCT20-12.5; BP= 128/72 but has been volatile at home & in Oncology office prompting DrMagrinat to change to Lisin10 & stop the Hct in light of her 103 wt loss...  ~  3/15: on MetopER100, Lisin20; BP= 112/64 & she denies CP, palpit, SOB, edema ~  9/15: on MetopER100, Lisin20; BP= 122/68 & she denies CP, palpit, SOB, edema; she wants to decr the MeptopER to 50mg /d- ok & watch BP at home...  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 she's been stable since that time w/ out pt f/u by IR, [name]> she indicates he said no further studies  needed. ~  CDopplers 5/04 showed mild plaque, no signif ICA stenoses... ~  MR studies 10/09 showed sm vessel dis; poss restenosis left MCA w/ angiogram rec- but wasn't done;  mod stenosis at origin of left vertebral art w/ tortuosity... ~  Carotid arteriogram 12/10 showed stable mild residual left middle cerebral art stenosis at site of prev angioplasty, and stable 1.5 to 38mm saccular right middle cerebral art aneurysm... ~  10/12:  she remains asymptomatic- w/o cerebral ischemic symptoms... ~  10/13:  she continues stable on ASA/ Plavix w/o cerebral ischemic symptoms... ~  3/15:  on ASA325 & Plavix75; s/p aneurysm & TIA; stable w/o cerebral ischemic symptoms- she has not had f/u DrDeveshwar-IR- she notes she was told no need for f/u angiography (we might consider MRI later) ~  9/15: on ASA325 & Plavix75; she denies cerebral ischemic symptoms & is stable...  HYPERCHOLESTEROLEMIA (ICD-272.0) >>  ~  FLP 3/08 shows Tchol 180, TG 206, HDL 49, LDL 105... rec same meds, better diet, get wt down. ~  Debra Mcintyre 10/09 on Lip80 showed TChol 150, TG 178, HDL 41, LDL 74 ~  FLP 1/11 on Lip80+Zetia10 showed TChol 162, TG 226, HDL 47, LDL 95... she wants to stop Zetia, get on diet & get wt down. ~  FLP 1/12 on Lip80 showed TChol 173, TG 164, HDL 45, LDL 95 ~  FLP 10/13 on Lip80 intermittently showed TChol 189, TG 231, HDL 45, LDL 112... She was c/o paresthesias that she was convinced was due to the Lip80 & she requested change to CRESTOR 20mg /d... ~  [place] 4/14 on Cres20 showed TChol 167, TG 141, HDL 46, LDL 93 ~  FLP 3/15 on Cres20 showed TChol 164, TG 116, HDL 49, LDL 92  HYPOTHYROIDISM (ICD-244.9) - on SYNTHROID 121mcg/d... ~  labs 3/08 showed TSH = 0.23 ~  labs 10/09 showed TSH= 0.64 ~  labs 1/11 showed TSH= 0.18... she wants to keep same dose to aide wt reduction. ~  labs 1/12 showed TSH= 0.20... Ditto ~  Labs 10/13 on Levo125 showed TSH= 1.20 ~  Labs 3/15 on Synthroid125 showed TSH= 0.35  COLONIC POLYPS  (ICD-211.3), & Hx of HEMORRHOIDS (ICD-455.6) ~  colonoscopy 10/00 by DrDBrodie showed hems only... f/u planned 59yrs. ~  f/u colonoscopy 11/10 showed 2 polyps- one adenomatous w/ f/u planned 79yrs.  DEGENERATIVE JOINT DISEASE (ICD-715.90) - uses Tylenol & OTC meds as needed... in 2004 seen by [facility] w/ soft tissue hemangioma found in right shoulder area... second opinion from DrWWard @  [facility] confirmed this- no surg necessary... ~  1/11:  notes some pain in hands & wrist... ~  1/12:  Stable w/o acute complaints or problem areas... ~  2014: she developed LBP & hip pain while receiving XRT for her breast cancer; eval by DrMagrinat & DrCaffrey w/ epid steroid shot that really helped... ~  She has a scoliosis and compression deformity in lower TSpine...  Hx of FIBROMYALGIA (ICD-729.1)  OSTEOPOROSIS (ICD-733.00) - she indicates that this is followed & managed by GYN, DrCousins> ?when last BMD was done "she keeps up w/ this" > on Calcium, & Vitamins... ~  labs 1/11 showed Vit D level = 54...  TIA (ICD-435.9) - as above, she remains on ASA325 & PLAVIX75... last saw DrReynolds in 2006... 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 arteriogram by Dr. Fleeta Emmer confirmed a web-like plaque in the left M1 segment of the left MCA with signif stenosis...  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 subseq PTA of left MCA w/ good result- resid 20% stenosis seen on f/u angiograms along w/  a 22mm saccular aneurysm seen in the right MCA trifurcation...  ANXIETY (ICD-300.00) - on EFFEXOR 75mg /d for hot flashes, she says.... she wishes to continue the med "it keeps me on an even keel".  Health Maintenance - GYN= DrCousins & she will call for f/u... Mammograms at [facility]... BMDs at [facility] & results sent to DrCousins... ~  Immunizations: she refuses Flu vaccine... given PNEUMOVAX- 1/11, and Tdap- 1/11...   Past  Surgical History  Procedure Laterality Date  . Left middle cerebral artery angioplasty  2004    by [name] had 2 follow up occurances  . Portacath placement N/A 11/05/2012    Procedure: INSERTION PORT-A-CATH;  Surgeon: [name], MD;  Location: [place];  Service: General;  Laterality: N/A;  . Breast biopsy Right 10/23/2012  . Breast biopsy Right 10/08/2012  . Mastectomy w/ sentinel node biopsy Right 04/23/2013    Procedure: RIGHT TOTAL MASTECTOMY WITH SENTINEL LYMPH NODE BIOPSY;  Surgeon: [name], MD;  Location: [place];  Service: General;  Laterality: Right;    Outpatient Encounter Prescriptions as of 04/16/2014  Medication Sig  . aspirin 325 MG tablet Take 325 mg by mouth daily.    . Calcium Carbonate-Vitamin D (CALCIUM + D PO) Take 1 tablet by mouth 2 (two) times daily.  . clopidogrel (PLAVIX) 75 MG tablet TAKE ONE TABLET BY MOUTH ONCE DAILY  . CRESTOR 20 MG tablet TAKE ONE TABLET BY MOUTH DAILY  . gabapentin (NEURONTIN) 300 MG capsule Take 300 mg by mouth daily.   Marland Kitchen lisinopril (PRINIVIL,ZESTRIL) 20 MG tablet TAKE ONE TABLET BY MOUTH ONCE DAILY  . metoprolol succinate (TOPROL-XL) 100 MG 24 hr tablet TAKE ONE TABLET BY MOUTH ONCE DAILY  . metroNIDAZOLE (METROCREAM) 0.75 % cream Apply 1 application topically 2 (two) times daily. As directed  . Multiple Vitamin (MULTIVITAMIN) capsule Take 1 capsule by mouth daily.    Marland Kitchen SYNTHROID 125 MCG tablet TAKE ONE TABLET BY MOUTH ONCE DAILY  . UNABLE TO FIND Rx: L8000- Post Surgical Bras (Quantity: 6) N4709- Non-Silicone Breast Prosthesis (Quantity: 1) G2836- Silicone Breast Prosthesis (Quantity: 1) Dx: 174.9; Right Mastectomy  . UNABLE TO FIND Rx: [id number]- Silicone Breast Prosthesis (Quantity: 1) **Replacement Dx: 174.9; Right mastectomy  . venlafaxine XR (EFFEXOR-XR) 75 MG 24 hr capsule TAKE ONE CAPSULE BY MOUTH ONCE DAILY    No Known Allergies   Current Medications, Allergies, Past Medical History, Past  Surgical History, Family History, and Social History were reviewed in [employer] record.    Review of Systems    Constitutional:  Denies F/C/S, appetite is good, weight is stable ~150# HEENT:  No HA, visual changes, earache, nasal symptoms, sore throat, hoarseness. Resp:  No cough, sputum, hemoptysis; +SOB/DOE w/o tightness or wheezing. Cardio:  No CP, palpit, orthopnea, edema; notes some DOE GI:  Denies N/V/D/C or blood in stool; no reflux, abd pain, distention, or gas. GU:  No dysuria, freq, urgency, hematuria, or flank pain. MS:  Denies joint pain, swelling, tenderness; no neck pain, back pain, etc. Neuro:  No tremors, seizures, dizziness, syncope; prev weakness & numbness improved, gait is ok... Skin:  No suspicious lesions or skin rash. Heme:  No bruising, bleeding, etc; axillary adenopathy is resolved Psyche: Denies confusion, sleep disturbance, hallucinations, anxiety, depression.   Objective:   Physical Exam      WD, WN, 73 y/o WF in NAD... GENERAL:  Alert & oriented; pleasant & cooperative... HEENT:  [place]/AT, EOM-wnl, PERRLA, EACs-clear, TMs-wnl, NOSE-clear, THROAT-clear & wnl. NECK:  Supple w/ fairROM; no JVD; normal carotid impulses w/o bruits; no thyromegaly or nodules palpated; no lymphadenopathy. CHEST:  Clear to P&A w/o wheezing, rales, or rhonchi detected... HEART:  Regular Rhythm; without murmurs/ rubs/ or gallops. ABDOMEN:  Soft & nontender; normal bowel sounds; no organomegaly or masses palpated. EXT: without deformities, mild arthritic changes; no varicose veins/ +venous insuffic/ no edema. NEURO:  CN's intact;  no focal neuro deficits... DERM:  No lesions noted; no rash etc...  RADIOLOGY DATA:  Reviewed in the EPIC EMR & discussed w/ the patient...  LABORATORY DATA:  Reviewed in the EPIC EMR & discussed w/ the patient...   Assessment & Plan:    RIGHT BREAST CANCER>> invasive ductal carcinoma, triple negative tumor, s/p surg by  DrWakefield, ChemoRx per DrMagrinat, XRT per DrMurray- their notes are reviewed...   ASTHMATIC BRONCHITIS>  No recent URIs or resp exac & she is once again not on regular meds, denies breathing problems, etc... Abn CXR>  Faint GG opacity in RUL has resolved (likely related to XRT); she is at baseline...  HBP>  Controlled on BBlocker, & ACE (DrM stopped the diuretic prev), she wants to decr the MetoprololER100 to 1/2 tab daily, ok but watch BP at home...  Cerebrovasc Dis>  She had TIA 2004 w/ eval revealing left MCA stenosis; DrDeveshwar did PTA w/ good result; incidental 1-85mm aneurysm noted in the right MCA; she has remained asymptomatic since 2004 on the ASA/ Plavix.  CHOL>  Controlled on Cres20 (she was intol to Lip80)...  HPYOTHYROID>  On Synthroid 180mcg/d w/ TSH is OK, continue same...  Other medical problems as noted...   Patient's Medications  New Prescriptions   No medications on file  Previous Medications   ASPIRIN 325 MG TABLET    Take 325 mg by mouth daily.     CALCIUM CARBONATE-VITAMIN D (CALCIUM + D PO)    Take 1 tablet by mouth 2 (two) times daily.   CLOPIDOGREL (PLAVIX) 75 MG TABLET    TAKE ONE TABLET BY MOUTH ONCE DAILY   CRESTOR 20 MG TABLET    TAKE ONE TABLET BY MOUTH DAILY   GABAPENTIN (NEURONTIN) 300 MG CAPSULE    Take 300 mg by mouth daily.    LISINOPRIL (PRINIVIL,ZESTRIL) 20 MG TABLET    TAKE ONE TABLET BY MOUTH ONCE DAILY   METOPROLOL SUCCINATE (TOPROL-XL) 100 MG 24 HR TABLET    TAKE 1/2 TABLET (50mg ) BY MOUTH ONCE DAILY   METRONIDAZOLE (METROCREAM) 0.75 % CREAM    Apply 1 application topically 2 (two) times daily. As directed   MULTIPLE VITAMIN (MULTIVITAMIN) CAPSULE    Take 1 capsule by mouth daily.     SYNTHROID 125 MCG TABLET    TAKE ONE TABLET BY MOUTH ONCE DAILY   UNABLE TO FIND    Rx: L8000- Post Surgical Bras (Quantity: 6) A4166- Non-Silicone Breast Prosthesis (Quantity: 1) A6301- Silicone Breast Prosthesis (Quantity: 1) Dx: 174.9; Right Mastectomy    UNABLE TO FIND    Rx: [id number]- Silicone Breast Prosthesis (Quantity: 1) **Replacement Dx: 174.9; Right mastectomy   VENLAFAXINE XR (EFFEXOR-XR) 75 MG 24 HR CAPSULE    TAKE ONE CAPSULE BY MOUTH ONCE DAILY  Modified Medications   No medications on file  Discontinued Medications   No medications on file

## 2014-04-16 NOTE — Patient Instructions (Signed)
Today we updated your med list in our EPIC system...    Continue your current medications the same...  Today we did a follow up CXR...    We will contact you w/ the results when available...   Keep up the good work w/ diet & exercise...    The goal is to increase your exercise program & improve your stamina...  .caqllf Let's plan a follow up visit in 5mo, w/ fasting blood work at that time.Debra KitchenMarland Mcintyre

## 2014-04-17 NOTE — Addendum Note (Signed)
Addended by: [name] on: 04/17/2014 11:49 AM   Modules accepted: Orders

## 2014-04-19 ENCOUNTER — Other Ambulatory Visit: Payer: Self-pay | Admitting: Pulmonary Disease

## 2014-04-21 ENCOUNTER — Ambulatory Visit: Payer: Medicare Other | Admitting: Internal Medicine

## 2014-04-28 ENCOUNTER — Encounter: Payer: Self-pay | Admitting: Internal Medicine

## 2014-05-04 DIAGNOSIS — D485 Neoplasm of uncertain behavior of skin: Secondary | ICD-10-CM | POA: Diagnosis not present

## 2014-05-05 ENCOUNTER — Telehealth: Payer: Self-pay | Admitting: Pulmonary Disease

## 2014-05-05 NOTE — Telephone Encounter (Signed)
Called spoke with pt. appt scheduled for flu vaccine Friday. Nothing further needed

## 2014-05-05 NOTE — Telephone Encounter (Signed)
Per epic: Pt has not had flu shot this year. Pneumococcal Polysaccharide-23 08/2009 TD 08/2011  Please advise SN if pt needs any other vaccines and if we can schedule pt to come in for flu shot? thanks

## 2014-05-05 NOTE — Telephone Encounter (Signed)
Per SN---  Needs flu shot and will be due for Prevnar 13 in 2016.

## 2014-05-08 ENCOUNTER — Ambulatory Visit: Payer: Medicare Other

## 2014-05-12 ENCOUNTER — Ambulatory Visit: Payer: Medicare Other

## 2014-05-13 ENCOUNTER — Ambulatory Visit (INDEPENDENT_AMBULATORY_CARE_PROVIDER_SITE_OTHER): Payer: Medicare Other

## 2014-05-13 DIAGNOSIS — Z23 Encounter for immunization: Secondary | ICD-10-CM

## 2014-05-26 ENCOUNTER — Other Ambulatory Visit: Payer: Self-pay | Admitting: *Deleted

## 2014-05-26 DIAGNOSIS — C50411 Malignant neoplasm of upper-outer quadrant of right female breast: Secondary | ICD-10-CM

## 2014-05-27 ENCOUNTER — Other Ambulatory Visit (HOSPITAL_BASED_OUTPATIENT_CLINIC_OR_DEPARTMENT_OTHER): Payer: Medicare Other

## 2014-05-27 ENCOUNTER — Ambulatory Visit (HOSPITAL_BASED_OUTPATIENT_CLINIC_OR_DEPARTMENT_OTHER): Payer: Medicare Other | Admitting: Oncology

## 2014-05-27 ENCOUNTER — Telehealth: Payer: Self-pay | Admitting: Oncology

## 2014-05-27 VITALS — BP 108/63 | HR 93 | Temp 98.6°F | Resp 18 | Ht 68.0 in | Wt 152.7 lb

## 2014-05-27 DIAGNOSIS — C773 Secondary and unspecified malignant neoplasm of axilla and upper limb lymph nodes: Secondary | ICD-10-CM

## 2014-05-27 DIAGNOSIS — M150 Primary generalized (osteo)arthritis: Secondary | ICD-10-CM

## 2014-05-27 DIAGNOSIS — C50911 Malignant neoplasm of unspecified site of right female breast: Secondary | ICD-10-CM

## 2014-05-27 DIAGNOSIS — C50411 Malignant neoplasm of upper-outer quadrant of right female breast: Secondary | ICD-10-CM

## 2014-05-27 DIAGNOSIS — Z171 Estrogen receptor negative status [ER-]: Secondary | ICD-10-CM

## 2014-05-27 DIAGNOSIS — M159 Polyosteoarthritis, unspecified: Secondary | ICD-10-CM

## 2014-05-27 LAB — CBC WITH DIFFERENTIAL/PLATELET
BASO%: 0.6 % (ref 0.0–2.0)
Basophils Absolute: 0 10*3/uL (ref 0.0–0.1)
EOS ABS: 0.1 10*3/uL (ref 0.0–0.5)
EOS%: 1.8 % (ref 0.0–7.0)
HEMATOCRIT: 40.4 % (ref 34.8–46.6)
HGB: 13.1 g/dL (ref 11.6–15.9)
LYMPH%: 29.9 % (ref 14.0–49.7)
MCH: 28.4 pg (ref 25.1–34.0)
MCHC: 32.4 g/dL (ref 31.5–36.0)
MCV: 87.7 fL (ref 79.5–101.0)
MONO#: 0.5 10*3/uL (ref 0.1–0.9)
MONO%: 9.3 % (ref 0.0–14.0)
NEUT%: 58.4 % (ref 38.4–76.8)
NEUTROS ABS: 3.1 10*3/uL (ref 1.5–6.5)
PLATELETS: 257 10*3/uL (ref 145–400)
RBC: 4.61 10*6/uL (ref 3.70–5.45)
RDW: 13.6 % (ref 11.2–14.5)
WBC: 5.4 10*3/uL (ref 3.9–10.3)
lymph#: 1.6 10*3/uL (ref 0.9–3.3)

## 2014-05-27 LAB — COMPREHENSIVE METABOLIC PANEL (CC13)
ALT: 20 U/L (ref 0–55)
AST: 19 U/L (ref 5–34)
Albumin: 3.9 g/dL (ref 3.5–5.0)
Alkaline Phosphatase: 72 U/L (ref 40–150)
Anion Gap: 12 mEq/L — ABNORMAL HIGH (ref 3–11)
BILIRUBIN TOTAL: 0.77 mg/dL (ref 0.20–1.20)
BUN: 20.9 mg/dL (ref 7.0–26.0)
CO2: 26 meq/L (ref 22–29)
CREATININE: 0.8 mg/dL (ref 0.6–1.1)
Calcium: 9.8 mg/dL (ref 8.4–10.4)
Chloride: 103 mEq/L (ref 98–109)
GLUCOSE: 95 mg/dL (ref 70–140)
Potassium: 4 mEq/L (ref 3.5–5.1)
Sodium: 141 mEq/L (ref 136–145)
TOTAL PROTEIN: 7.1 g/dL (ref 6.4–8.3)

## 2014-05-27 NOTE — Progress Notes (Signed)
ID: Debra Mcintyre   DOB: [birth year]  MR#: [id number]  WUJ#:[id number]  PCP: [name], MD GYN: [name] SU: [name] OTHER MD: Arloa Koh, [name], Lillia Mountain, Clydell Hakim,   HISTORY OF PRESENT ILLNESS: Debra Mcintyre had screening mammography at [facility] 04/10/2012 raising the question of some axillary lymph nodes on the right. Additional views of the right axilla and right axillary ultrasound performed 04/04/2012 showed 3 lymph nodes that appeared larger than prior. The largest measured 1.5 cm. 2 of them had cortical thickening. Close followup was suggested, and repeat right axillary ultrasound 06/25/2012 showed no significant change in the lymph nodes in question. Followup ultrasound in 3 months was recommended, but in the interim the patient saw her primary physician, [name] and AD[facility], and he set her up for a chest CT on 07/08/2012, which showed a mild asymmetric density in the upper mid right breast. There was a prominent right axillary lymph node measuring 1.9 cm, with a few smaller adjacent nodes asymmetric with compared to the left side. Again, short interval followup was recommended, and on 10/07/2012 the patient had digital right mammography and ultrasonography, now at the breast Center. Dr. [name] noted pleomorphic calcifications spanning an area of 6.3 cm without associated mass. Physical exam was unremarkable. Ultrasound showed an area of adenopathy in the right axilla measuring 1.6 cm, with a second lymph node with a thickened cortex measuring 1.2 cm. No suspicious mass was seen by ultrasound in the right breast.  Biopsy of the larger axillary lymph node on 10/07/2012 showed ([id number]) an invasive ductal carcinoma, triple negative, with an MIB-1 of 66%. Biopsy of the right breast the next day, [id number]) showed invasive ductal carcinoma, grade 3. Breast MRI 10/14/2012 showed a total area of irregular enhancement in the right breast measuring up to 12 cm. MRI guided  biopsy of an area in the upper inner quadrant of the right breast on 10/23/2012 showed ([id number]) ductal carcinoma in situ, with foci worrisome for invasion.  The patient's subsequent history is as detailed below  INTERVAL HISTORY: Debra Mcintyre returns today for followup of her locally advanced right breast cancer. The interval history is generally unremarkable. She explored the possibility of reconstruction but after much thought decided she is pretty happy the way things are and she will just keep her removable prosthesis. She is exercising regularly through the [place] program which she is enjoying  REVIEW OF SYSTEMS: She had a couple of skin cancers removed, not melanomas, by Dr. Phillip Heal in medicine. Just some sinus issues. She can be short of breath sometimes when she walks up slopes or up stairs. There've been no unusual headaches, visual changes, nausea, or vomiting. She is still has some neuropathy affecting particularly her feet. Otherwise a detailed review of systems today was stable  PAST MEDICAL HISTORY: Past Medical History  Diagnosis Date  . Cerebrovascular disease, unspecified   . Cerebral aneurysm, nonruptured   . Pure hypercholesterolemia   . Hypothyroidism   . Colonic polyp   . History of chemotherapy   . TIA (transient ischemic attack)   . Hypertension     Does not see a cardiologist  . Neuropathy   . Breast cancer 09/2012    right breast/right axillary lymph node t2,pn1, stage 11b, invasive ductal carcinma, grade 3, triple negative, with an mib-1 of 15%  . Hx of radiation therapy 05/27/13-08/13/13    right chest wall/right supraclavicular/axillary region 5220 cGy 29 sessions, right mastectomy/chest wall boost cGy 5 sessions  PAST SURGICAL HISTORY: Past Surgical History  Procedure Laterality Date  . Left middle cerebral artery angioplasty  2004    by [name] had 2 follow up occurances  . Portacath placement N/A 11/05/2012    Procedure: INSERTION PORT-A-CATH;   Surgeon: [name], MD;  Location: [facility];  Service: General;  Laterality: N/A;  . Breast biopsy Right 10/23/2012  . Breast biopsy Right 10/08/2012  . Mastectomy w/ sentinel node biopsy Right 04/23/2013    Procedure: RIGHT TOTAL MASTECTOMY WITH SENTINEL LYMPH NODE BIOPSY;  Surgeon: [name], MD;  Location: [facility];  Service: General;  Laterality: Right;    FAMILY HISTORY Family History  Problem Relation Age of Onset  . Lung cancer Mother 1  . Heart disease Father   . Colon cancer Brother 65  . Alcohol abuse Brother   . Colon cancer Maternal Uncle     dx in his 74s  . Cancer Maternal Aunt     unknown type  . Ovarian cancer Other 65   the patient's father died at the age of 1. The patient's mother died at the age of 46 from lung cancer. She was not a smoker. The cancer was diagnosed when she was 71 years old. The patient's mother had 2 sisters and one brother. One of those sisters had lung cancer and a brother had colon cancer, both diagnosed in their 92s. The patient herself has one brother who died from colon cancer at the age of 56. That brother has a daughter who was diagnosed with uterine cancer at the age of 82. All this raises the question of possible Lynch syndrome and I have referred the patient for genetics counseling. The patient does have one sister who was diagnosed with breast cancer in 2015.  GYNECOLOGIC HISTORY: Menarche age 69, menopause around 75. The patient is GX P0. She took birth control pills for many years with no complications.  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 Her stepchildren are Debra Mcintyre who lives in "[place]" and is financial vice president of [employer], and Debra Mcintyre who lives in [place] and works as a Tree surgeon. The patient has 5 grandchildren. She is currently not a church attender   ADVANCED DIRECTIVES: Not in  place; this was discussed 10/29/2012 and the patient was advised to complete a healthcare power of attorney document  HEALTH MAINTENANCE: History  Substance Use Topics  . Smoking status: Former Smoker -- 1.00 packs/day for 20 years    Types: Cigarettes    Quit date: 04/02/1986  . Smokeless tobacco: Never Used  . Alcohol Use: No     Colonoscopy: 2012  PAP: 2012  Bone density:  Lipid panel:  No Known Allergies  Current Outpatient Prescriptions  Medication Sig Dispense Refill  . aspirin 325 MG tablet Take 325 mg by mouth daily.        . Calcium Carbonate-Vitamin D (CALCIUM + D PO) Take 1 tablet by mouth 2 (two) times daily.      . clopidogrel (PLAVIX) 75 MG tablet TAKE ONE TABLET BY MOUTH ONCE DAILY  30 tablet  6  . CRESTOR 20 MG tablet TAKE ONE TABLET BY MOUTH DAILY  30 tablet  6  . gabapentin (NEURONTIN) 300 MG capsule Take 300 mg by mouth daily.       Marland Kitchen lisinopril (PRINIVIL,ZESTRIL) 20 MG tablet TAKE ONE TABLET BY MOUTH ONCE DAILY  30 tablet  6  . metoprolol succinate (TOPROL-XL) 100 MG 24 hr tablet TAKE ONE  half TABLET BY MOUTH ONCE DAILY      . metroNIDAZOLE (METROCREAM) 0.75 % cream Apply 1 application topically 2 (two) times daily. As directed      . Multiple Vitamin (MULTIVITAMIN) capsule Take 1 capsule by mouth daily.        Marland Kitchen SYNTHROID 125 MCG tablet TAKE ONE TABLET BY MOUTH ONCE DAILY  30 tablet  6  . UNABLE TO FIND Rx: L8000- Post Surgical Bras (Quantity: 6) V7616- Non-Silicone Breast Prosthesis (Quantity: 1) W7371- Silicone Breast Prosthesis (Quantity: 1) Dx: 174.9; Right Mastectomy  1 each  0  . UNABLE TO FIND Rx: G6269- Silicone Breast Prosthesis (Quantity: 1) **Replacement Dx: 174.9; Right mastectomy  1 each  0  . venlafaxine XR (EFFEXOR-XR) 75 MG 24 hr capsule TAKE ONE CAPSULE BY MOUTH ONCE DAILY  30 capsule  3   No current facility-administered medications for this visit.    OBJECTIVE: Middle-aged white woman who appears stated age 73 Vitals:   05/27/14  1448  BP: 108/63  Pulse: 93  Temp: 98.6 F (37 C)  Resp: 18     Body mass index is 23.22 kg/(m^2).    ECOG FS: 0 Filed Weights   05/27/14 1448  Weight: 152 lb 11.2 oz (69.264 kg)   Sclerae unicteric, EOMs intact Oropharynx clear, dentition in good repair No cervical or supraclavicular adenopathy Lungs no rales or rhonchi Heart regular rate and rhythm Abd soft, nontender, positive bowel sounds MSK no focal spinal tenderness, no upper extremity lymphedema Neuro: nonfocal, well oriented, positive affect Breasts: The right breast is status post mastectomy and radiation. There is no evidence of local recurrence. The right axilla is benign. The left breast is unremarkable  LAB RESULTS: Lab Results  Component Value Date   WBC 5.4 05/27/2014   NEUTROABS 3.1 05/27/2014   HGB 13.1 05/27/2014   HCT 40.4 05/27/2014   MCV 87.7 05/27/2014   PLT 257 05/27/2014      Chemistry      Component Value Date/Time   NA 141 10/20/2013 1007   NA 139 05/13/2013 1134   K 4.0 10/20/2013 1007   K 4.0 05/13/2013 1134   CL 105 10/20/2013 1007   CL 102 01/28/2013 0909   CO2 27 10/20/2013 1007   CO2 25 05/13/2013 1134   BUN 15 10/20/2013 1007   BUN 11.5 05/13/2013 1134   CREATININE 0.6 10/20/2013 1007   CREATININE 0.7 05/13/2013 1134      Component Value Date/Time   CALCIUM 9.8 10/20/2013 1007   CALCIUM 9.9 05/13/2013 1134   ALKPHOS 60 10/20/2013 1007   ALKPHOS 65 05/13/2013 1134   AST 21 10/20/2013 1007   AST 17 05/13/2013 1134   ALT 18 10/20/2013 1007   ALT 12 05/13/2013 1134   BILITOT 1.0 10/20/2013 1007   BILITOT 0.69 05/13/2013 1134       STUDIES: No results found.  ASSESSMENT: 73 y.o. [place] woman   (1)  status post right breast and right axillary lymph node biopsies February 2014 of a clinical T2, pN1, stage IIB  invasive ductal carcinoma, grade 3, triple negative, with an MIB-1 of 15%  (2)  a third biopsy from a different quadrant 10/23/2012 showed ductal carcinoma in situ  (3)  treated in the  neoadjuvant setting with 4 dose dense cycles of doxorubicin/ cyclophosphamide 12/23/2012, followed by 7 weekly doses of paclitaxel and completed 03/03/2013.  (4) status post right mastectomy  and sentinel lymph node sampling 04/23/2013 for a residual pT1b pN1 invasive ductal carcinoma, grade 3, again triple negative [2/10 sample lymph nodes were involved by tumor, both with micrometastatic deposits]  (5) adjuvant radiation scheduled completed 08/13/2013     PLAN:  Debra Mcintyre is doing well as far as her breast cancer is concerned. He is particularly reassuring that her chest CT was normal, except of course for the areas of radiation change. That study will service as a useful baseline in the future.  She has not yet resumed a full exercise program, though she is walking her dog. She is planning to do some water aerobics. I gave her a copy of the [employer] pamphlet so she can consider participating in that program, which I think will E. helpful to her.  She will be meeting with Dr. Donne Hazel soon to begin discussing reconstruction. This is likely to keep her busy in the next few months. Accordingly she will see me again in 6 months from now. She knows to call for any problems that may develop before that visit.  Chauncey Cruel, MD    05/27/2014

## 2014-05-27 NOTE — Telephone Encounter (Signed)
per pof to sch pt appt-sch & gave pt copy of sch °

## 2014-08-18 ENCOUNTER — Other Ambulatory Visit: Payer: Self-pay | Admitting: Pulmonary Disease

## 2014-09-03 DIAGNOSIS — M79674 Pain in right toe(s): Secondary | ICD-10-CM | POA: Diagnosis not present

## 2014-09-03 DIAGNOSIS — M79675 Pain in left toe(s): Secondary | ICD-10-CM | POA: Diagnosis not present

## 2014-09-07 ENCOUNTER — Other Ambulatory Visit: Payer: Self-pay | Admitting: Pulmonary Disease

## 2014-10-05 ENCOUNTER — Other Ambulatory Visit: Payer: Self-pay | Admitting: Pulmonary Disease

## 2014-10-06 DIAGNOSIS — Z779 Other contact with and (suspected) exposures hazardous to health: Secondary | ICD-10-CM | POA: Diagnosis not present

## 2014-10-06 DIAGNOSIS — Z01419 Encounter for gynecological examination (general) (routine) without abnormal findings: Secondary | ICD-10-CM | POA: Diagnosis not present

## 2014-10-07 ENCOUNTER — Encounter: Payer: Self-pay | Admitting: Internal Medicine

## 2014-10-09 ENCOUNTER — Other Ambulatory Visit: Payer: Self-pay | Admitting: Nurse Practitioner

## 2014-10-13 ENCOUNTER — Ambulatory Visit (INDEPENDENT_AMBULATORY_CARE_PROVIDER_SITE_OTHER): Payer: Medicare Other | Admitting: Pulmonary Disease

## 2014-10-13 ENCOUNTER — Encounter: Payer: Self-pay | Admitting: Pulmonary Disease

## 2014-10-13 VITALS — BP 112/64 | HR 78 | Temp 97.9°F | Ht 68.0 in | Wt 156.0 lb

## 2014-10-13 DIAGNOSIS — J4520 Mild intermittent asthma, uncomplicated: Secondary | ICD-10-CM | POA: Diagnosis not present

## 2014-10-13 DIAGNOSIS — K219 Gastro-esophageal reflux disease without esophagitis: Secondary | ICD-10-CM

## 2014-10-13 DIAGNOSIS — E039 Hypothyroidism, unspecified: Secondary | ICD-10-CM | POA: Diagnosis not present

## 2014-10-13 DIAGNOSIS — C773 Secondary and unspecified malignant neoplasm of axilla and upper limb lymph nodes: Secondary | ICD-10-CM | POA: Diagnosis not present

## 2014-10-13 DIAGNOSIS — I10 Essential (primary) hypertension: Secondary | ICD-10-CM | POA: Diagnosis not present

## 2014-10-13 DIAGNOSIS — I679 Cerebrovascular disease, unspecified: Secondary | ICD-10-CM | POA: Diagnosis not present

## 2014-10-13 DIAGNOSIS — E7800 Pure hypercholesterolemia, unspecified: Secondary | ICD-10-CM

## 2014-10-13 DIAGNOSIS — F411 Generalized anxiety disorder: Secondary | ICD-10-CM | POA: Diagnosis not present

## 2014-10-13 DIAGNOSIS — G452 Multiple and bilateral precerebral artery syndromes: Secondary | ICD-10-CM | POA: Diagnosis not present

## 2014-10-13 DIAGNOSIS — D126 Benign neoplasm of colon, unspecified: Secondary | ICD-10-CM

## 2014-10-13 DIAGNOSIS — C50911 Malignant neoplasm of unspecified site of right female breast: Secondary | ICD-10-CM

## 2014-10-13 NOTE — Progress Notes (Addendum)
Subjective:    Patient ID: Debra Mcintyre, female    DOB: 02-22-1941, 74 y.o.   MRN: [id number]  HPI 74 y/o WF here for a follow up visit... she has multiple medical problems as noted below...  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     Recently her BP has been "up & down" per pt> on  MetopER100 & LisinHCT20-12.5; she has log of recent BP readings and most are wnl; DrMagrinat has adjusted her med by changing the LisinHCT to [place] alone & I endorse this change in meds, she will continue to monitor...  We reviewed prob list, meds, xrays and labs> see below for updates >>   ~  March 2. 2015:  52mo ROV & Debra Mcintyre continues her regular follow up w/ DrWakefield, DrMagrinat, & DrMurray for her triple neg breast cancer; she completed XRT (right chest wall & regional LNs) ~12/14 & has been doing satis since then, DrMurray signed off; last note from DrMagrinat 12/14 reviewed- he rec increased exercise & continued Q41mo ROV... She developed back & hip pain while getting her XRT- eval by DrCaffrey w/ ?HNP, no sign of mets, given epid steroid shot which really helped...  She has some paresthesias & tried Neurontin, stopped this on her own...     HBP> on MetopER100, Lisin20; BP= 112/64 & she denies CP, palpit, SOB, edema...    Cerebrovasc Dis> on FOY774 & Plavix75; s/p aneurysm & TIA; stable w/o cerebral ischemic symptoms- she has not had f/u DrDeveshwar-IR- she notes she was told no need for f/u angiography (we might consider MRI later)...    Chol> on  Cres20; FLP showed TChol 164, TG 116, HDL 49, LDL 92; rec to continue same med & diet efforts...    Hypothyroid> on Synthroid 169mcg/d; TSH= 0.35 & she is clinically euthyroid, continue same...    Colon polyps> last colon was 11/10 w/ one adenomatous polyp removed, f/u planned 75yrs.    Breast cancer> followed by DrMagrinat, DrWakefield, DrMurray w/ prev surg, chemoRx and XRT- notes reviewed...    DJD, FM, Osteop> on Calcium, MVI, OTC analgesics; BMD followed by her GYN...    Anxiety> on EffexorXR75 and she reports stable on this med...  We reviewed prob list, meds, xrays and labs> see below for updates >>   CXR 3/15 showed borderline heart size, left sided port, new RUL airsp dis (could be fibrosis from XRT), scoliosis & lower Tspine compression...  CT Chest 3/15 showed subpleural airspace consolidation and ground-glass in the anterior aspect of the right upper and right middle lobes indicative of radiation therapy; post op changes in right breast & axila; incidental 28mm nodule in RMLunchanged from 2013, no adenopathy, atherosclerotic calcif noted...   LABS 3/15:  FLP- at goals on Cres20;  Chems- wnl;  CBC- wnl w/ Hg=12.6;  TSH=0.35 on Synthroid125...   ~  April 16, 2014:  70mo ROV & Debra Mcintyre reports a good interval- she informs me that she has decided against reconstruction &  is very comfortable w/ her decision;  She notes some fall allergies & is using Zyrtek prn;  She gets exercise by walking the dog & will join a Y program soon;  She has had f/u visits w/ DrSanger, DrWakefield, & DrMagrinat in the interval- stable & doing satis, f/u left mammogram 6/15 (she is s/p right mastectomy) and it was neg... We reviewed the following medical problems during today's office visit >>     AbnCXR> CXR 3/15 showed new RUL airspace dis & CTChest 3/15 revealed GG opacities prob related to radiation therapy; incidental 24mm nodule in RML w/o ch from 2013, atherosclerotic calcif, no adenopathy; due for f/u film  today=> improved...    HBP> on MetopER100, Lisin20; BP= 122/68 & she denies CP, palpit, SOB, edema; she wants to decr the MeptopER to 50mg /d- ok & watch BP at home...    Cerebrovasc Dis> on JJH417 & Plavix75; s/p aneurysm & TIA; stable w/o cerebral ischemic symptoms- she has not had f/u DrDeveshwar,IR- she notes she was told no need for f/u angiography (we might consider MRI later)...    Chol> on Cres20; FLP 3/15 showed TChol 164, TG 116, HDL 49, LDL 92; rec to continue same med & diet efforts...    Hypothyroid> on Synthroid 120mcg/d; labs 3/15 showed TSH= 0.35 & she is clinically euthyroid, continue same...    Colon polyps> last colon was 11/10 w/ one adenomatous polyp removed, f/u planned 7yrs.    Breast cancer> followed by DrMagrinat, DrWakefield, DrMurray w/ prev surg, chemoRx and XRT- notes reviewed; she has decided against reconstruction...    DJD, FM, Osteop> on Calcium, MVI, OTC analgesics; BMD followed by her GYN...    Anxiety> on EffexorXR75 and she reports stable on this med...  We reviewed prob list, meds, xrays and labs> see below for updates >>   CXR 9/15 showed no infiltrate, opacity, etc; scoliosis & stable compression deformity; port has been removed...  ~  October 13, 2014:  64mo ROV & Debra Mcintyre reports feeling well, no new commplaints or concerns; she notes that her chest is ok- no CP, palpit, SOB, and she denies cough/ sput/ hemoptysis; her weight is 156# w/ BMI=24... We reviewed the following medical problems during today's office visit >>     AbnCXR> CXR 3/15 w/ new RUL airspace dis & CTChest 3/15 revealed GG opacities prob related to radiation therapy; incidental 78mm nodule in RML w/o ch from 2013, atherosclerotic calcif, no adenopathy; f/u film 9/15=> improved.    HBP> on MetopER100, Lisin20; BP= 112/64 & she denies CP, palpit, SOB, edema...    Cerebrovasc Dis> on EYC144 & Plavix75; s/p aneurysm & TIA; stable w/o cerebral ischemic symptoms- she has not had f/u DrDeveshwar,IR- she  notes she was told no need for f/u angiography (we might consider MRI later)...    Chol> on Cres20; FLP 3/16 showed TChol 174, TG 200, HDL 45, LDL 89; rec to continue same med & diet efforts...    Hypothyroid> on Synthroid 174mcg/d; labs 3/16 showed TSH= 0.11 & she is clinically euthyroid, rec to step down her Syntroid dose to 145mcg/d...    Colon polyps> last colon was 11/10 w/ one adenomatous polyp removed, f/u planned by DrDBrodie 3/16- pending...    Breast cancer> followed by DrMagrinat Q24mo, DrWakefield, DrMurray w/ prev surg, chemoRx (triple neg tumor) and XRT- notes reviewed; she has decided against reconstruction...    DJD, FM, Osteop> on Calcium, MVI, OTC analgesics; BMD followed by her GYN...    Anxiety> on EffexorXR75  and she reports stable on this med...  We reviewed prob list, meds, xrays and labs> see below for updates >>   LABS 3/16:  FLP- ok on Cres20 x TG=200;  Chems- wnl;  CBC- wnl;  TSH=0.11 on Synthroid125... PLAN>> Tora is stable & in good spirits; she is asked to follow a low chol/ low fat diet & work on wt reduction; her TSH is oversuppressed on Synthroid125 & we will step her down to 133mcg/d...            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 Marland Kitchen.. ~  Followed by DrMagrinat, DrWakefield, DrMurray w/ prev surg, chemoRx and XRT- notes reviewed... ~  2015: She reports that 30 y/o sister was Dx w/ breast cancer this yr...  HYPERTENSION (ICD-401.9) > ~  on TOPROL XL 100mg /d,  LISINOPRIL/ Hct 20/12.5 daily; BP well controlled on these meds; denies HA, fatigue, visual changes, CP, palipit, dizziness, syncope, dyspnea, edema, etc; she does water aerobics &  walks for exercise...  ~  CXR 8/12 showed normal heart size, clear lungs, scoliosis, NAD... ~  10/12:  Presents w/ chest discomfort> EKG showed NSR, rate 69, rsr' in V1-2, otherw wnl; referred to Cards=> she requests DrKelly. ~  11/13:  BP= 128/82 & she remains asymptomatic... ~  7/14: on MetopER100 & LisinHCT20-12.5; BP= 128/72 but has been volatile at home & in Oncology office prompting DrMagrinat to change to Lisin10 & stop the Hct in light of her 103 wt loss...  ~  3/15: on MetopER100, Lisin20; BP= 112/64 & she denies CP, palpit, SOB, edema ~  9/15: on MetopER100, Lisin20; BP= 122/68 & she denies CP, palpit, SOB, edema; she wants to decr the MeptopER to 50mg /d => but she never did... 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 she's been stable since that time w/ out pt f/u by IR, [name]> she indicates he said no further studies needed. ~  CDopplers 5/04 showed mild plaque, no signif ICA stenoses... ~  MR studies 10/09 showed sm vessel dis; poss restenosis left MCA w/ angiogram rec- but wasn't done; mod stenosis at origin of left vertebral art w/ tortuosity... ~  Carotid arteriogram 12/10 showed stable mild residual left middle cerebral art stenosis at site of prev angioplasty, and stable 1.5 to 55mm saccular right middle cerebral art aneurysm... ~  10/12:  she remains asymptomatic- w/o cerebral ischemic symptoms... ~  10/13:  she continues stable on ASA/ Plavix w/o cerebral ischemic symptoms... ~  3/15 & 9/15: on ASA325 & Plavix75; s/p aneurysm & TIA; stable w/o cerebral ischemic symptoms- she has not had f/u DrDeveshwar-IR- she notes she was told no need for f/u angiography (we might consider MRI  later) ~  3/16: on [place]; she denies cerebral ischemic symptoms & is stable...  HYPERCHOLESTEROLEMIA (ICD-272.0) >>  ~  FLP 3/08 shows Tchol 180, TG 206, HDL 49, LDL 105... rec same meds, better  diet, get wt down. ~  FLP 10/09 on Lip80 showed TChol 150, TG 178, HDL 41, LDL 74 ~  FLP 1/11 on Lip80+Zetia10 showed TChol 162, TG 226, HDL 47, LDL 95... she wants to stop Zetia, get on diet & get wt down. ~  FLP 1/12 on Lip80 showed TChol 173, TG 164, HDL 45, LDL 95 ~  FLP 10/13 on Lip80 intermittently showed TChol 189, TG 231, HDL 45, LDL 112... She was c/o paresthesias that she was convinced was due to the Lip80 & she requested change to CRESTOR 20mg /d... ~  [place] 4/14 on Cres20 showed TChol 167, TG 141, HDL 46, LDL 93 ~  FLP 3/15 on Cres20 showed TChol 164, TG 116, HDL 49, LDL 92 ~  FLP 3/16 on Cres20 showed TChol 174, TG 200, HDL 45, LDL 89; we reviewed diet, exercise, wt reduction...  HYPOTHYROIDISM (ICD-244.9) - on SYNTHROID 134mcg/d... ~  labs 3/08 showed TSH = 0.23 ~  labs 10/09 showed TSH= 0.64 ~  labs 1/11 showed TSH= 0.18... she wants to keep same dose to aide wt reduction. ~  labs 1/12 showed TSH= 0.20... Ditto ~  Labs 10/13 on Levo125 showed TSH= 1.20 ~  Labs 3/15 on Synthroid125 showed TSH= 0.35 ~  Labs 3/16 on Synthroid125 showed TSH= 0.11; we decided to step down her Synthroid dose to 157mcg/d...  COLONIC POLYPS (ICD-211.3), & Hx of HEMORRHOIDS (ICD-455.6) ~  colonoscopy 10/00 by DrDBrodie showed hems only... f/u planned 67yrs. ~  f/u colonoscopy 11/10 showed 2 polyps- one adenomatous w/ f/u planned 74yrs. ~ she is sched for f/u colon by DrDBrodie 3/16 - pending  DEGENERATIVE JOINT DISEASE (ICD-715.90) - uses Tylenol & OTC meds as needed... 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 second opinion from DrWWard @  [place] confirmed this- no surg necessary... ~  1/11:  notes some pain in hands & wrist... ~  1/12:  Stable w/o acute  complaints or problem areas... ~  2014: she developed LBP & hip pain while receiving XRT for her breast cancer; eval by DrMagrinat & DrCaffrey w/ epid steroid shot that really helped... ~  She has a scoliosis and compression deformity in lower TSpine...  Hx of FIBROMYALGIA (ICD-729.1)  OSTEOPOROSIS (ICD-733.00) - she indicates that this is followed & managed by GYN, DrCousins> ?when last BMD was done "she keeps up w/ this" > on Calcium, & Vitamins... ~  labs 1/11 showed Vit D level = 54... ~  Pt again reiterates that GYN follows her BMDs & is up to date...  TIA (ICD-435.9) - as above, she remains on ASA325 & PLAVIX75... last saw DrReynolds in 2006... 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 arteriogram by Dr. Fleeta Emmer confirmed a web-like plaque in the left M1 segment of the left MCA with signif stenosis...  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 subseq PTA of left MCA w/ good result- resid 20% stenosis seen on f/u angiograms along w/ a 1mm saccular aneurysm seen in the right MCA trifurcation...  ANXIETY (ICD-300.00) - on EFFEXOR 75mg /d for hot flashes, she says.... she wishes to continue the med "it keeps me on an even keel".  Health Maintenance - GYN= DrCousins & she will call for f/u... Mammograms at [facility]... BMDs at [facility] & results sent to DrCousins... ~  Immunizations: she refuses Flu vaccine... given PNEUMOVAX- 1/11, and Tdap- 1/11.Marland KitchenMarland Kitchen  Past Surgical History  Procedure Laterality Date  . Left middle cerebral artery angioplasty  2004    by [name] had 2 follow up occurances  . Portacath placement N/A 11/05/2012    Procedure: INSERTION PORT-A-CATH;  Surgeon: [name], MD;  Location: [place];  Service: General;  Laterality: N/A;  . Breast biopsy Right 10/23/2012  . Breast biopsy Right 10/08/2012  . Mastectomy w/ sentinel node biopsy Right  04/23/2013    Procedure: RIGHT TOTAL MASTECTOMY WITH SENTINEL LYMPH NODE BIOPSY;  Surgeon: [name], MD;  Location: [place];  Service: General;  Laterality: Right;    Outpatient Encounter Prescriptions as of 10/13/2014  Medication Sig  . aspirin 325 MG tablet Take 325 mg by mouth daily.    . Calcium Carbonate-Vitamin D (CALCIUM + D PO) Take 1 tablet by mouth 2 (two) times daily.  . clopidogrel (PLAVIX) 75 MG tablet TAKE ONE TABLET BY MOUTH ONCE DAILY  . CRESTOR 20 MG tablet TAKE ONE TABLET BY MOUTH ONCE DAILY  . lisinopril (PRINIVIL,ZESTRIL) 20 MG tablet TAKE ONE TABLET BY MOUTH ONCE DAILY  . metoprolol succinate (TOPROL-XL) 100 MG 24 hr tablet TAKE ONE  half TABLET BY MOUTH ONCE DAILY  . metroNIDAZOLE (METROCREAM) 0.75 % cream Apply 1 application topically 2 (two) times daily. As directed  . Multiple Vitamin (MULTIVITAMIN) capsule Take 1 capsule by mouth daily.    Marland Kitchen SYNTHROID 125 MCG tablet TAKE ONE TABLET BY MOUTH ONCE DAILY  . UNABLE TO FIND Rx: L8000- Post Surgical Bras (Quantity: 6) R6789- Non-Silicone Breast Prosthesis (Quantity: 1) F8101- Silicone Breast Prosthesis (Quantity: 1) Dx: 174.9; Right Mastectomy  . UNABLE TO FIND Rx: [id number]- Silicone Breast Prosthesis (Quantity: 1) **Replacement Dx: 174.9; Right mastectomy  . venlafaxine XR (EFFEXOR-XR) 75 MG 24 hr capsule TAKE ONE CAPSULE BY MOUTH ONCE DAILY  . [DISCONTINUED] gabapentin (NEURONTIN) 300 MG capsule Take 300 mg by mouth daily.     No Known Allergies   Current Medications, Allergies, Past Medical History, Past Surgical History, Family History, and Social History were reviewed in [employer] record.    Review of Systems    Constitutional:  Denies F/C/S, appetite is good, weight is stable ~150# HEENT:  No HA, visual changes, earache, nasal symptoms, sore throat, hoarseness. Resp:  No cough, sputum, hemoptysis; +SOB/DOE w/o tightness or wheezing. Cardio:  No CP, palpit, orthopnea, edema; notes  some DOE GI:  Denies N/V/D/C or blood in stool; no reflux, abd pain, distention, or gas. GU:  No dysuria, freq, urgency, hematuria, or flank pain. MS:  Denies joint pain, swelling, tenderness; no neck pain, back pain, etc. Neuro:  No tremors, seizures, dizziness, syncope; prev weakness & numbness improved, gait is ok... Skin:  No suspicious lesions or skin rash. Heme:  No bruising, bleeding, etc; axillary adenopathy is resolved Psyche: Denies confusion, sleep disturbance, hallucinations, anxiety, depression.   Objective:   Physical Exam      WD, WN, 74 y/o WF in NAD... GENERAL:  Alert & oriented; pleasant & cooperative... HEENT:  [place]/AT, EOM-wnl, PERRLA, EACs-clear, TMs-wnl, NOSE-clear, THROAT-clear & wnl. NECK:  Supple w/ fairROM; no JVD; normal carotid impulses w/o bruits; no thyromegaly or nodules palpated; no lymphadenopathy. CHEST:  Clear to P&A w/o wheezing, rales, or rhonchi detected... HEART:  Regular Rhythm; without murmurs/ rubs/ or gallops. ABDOMEN:  Soft & nontender; normal bowel sounds; no organomegaly or masses palpated. EXT: without deformities, mild arthritic changes; no varicose veins/ +venous insuffic/ no edema. NEURO:  CN's intact;  no focal neuro deficits... DERM:  No lesions noted; no rash etc...  RADIOLOGY DATA:  Reviewed in the EPIC EMR & discussed w/ the patient...  LABORATORY DATA:  Reviewed in the EPIC EMR & discussed w/ the patient...   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   ASTHMATIC BRONCHITIS>  No recent URIs or resp exac & she is once again not on regular meds, denies breathing problems, etc... Abn CXR>  Faint GG opacity in RUL has resolved (likely related to XRT); she is at baseline...  HBP>  Controlled on BBlocker, & ACE (DrM stopped the diuretic prev), she wants to decr the MetoprololER100 to 1/2  tab daily, ok but watch BP at home...  Cerebrovasc Dis>  She had TIA 2004 w/ eval revealing left MCA stenosis; DrDeveshwar did PTA w/ good result; incidental 1-67mm aneurysm noted in the right MCA; she has remained asymptomatic since 2004 on the ASA/ Plavix.  CHOL>  Controlled on Cres20 (she was intol to Lip80), needs better low fat diet...  HPYOTHYROID>  On Synthroid 113mcg/d w/ TSH = 0/11 so we will step down her dose to 193mcg/d...  Other medical problems as noted...   Patient's Medications  New Prescriptions   No medications on file  Previous Medications   ASPIRIN 325 MG TABLET    Take 325 mg by mouth daily.     CALCIUM CARBONATE-VITAMIN D (CALCIUM + D PO)    Take 1 tablet by mouth 2 (two) times daily.   CLOPIDOGREL (PLAVIX) 75 MG TABLET    TAKE ONE TABLET BY MOUTH ONCE DAILY   CRESTOR 20 MG TABLET    TAKE ONE TABLET BY MOUTH ONCE DAILY   LISINOPRIL (PRINIVIL,ZESTRIL) 20 MG TABLET    TAKE ONE TABLET BY MOUTH ONCE DAILY   METOPROLOL SUCCINATE (TOPROL-XL) 100 MG 24 HR TABLET    take 1 tablet daily.   METRONIDAZOLE (METROCREAM) 0.75 % CREAM    Apply 1 application topically 2 (two) times daily. As directed   MULTIPLE VITAMIN (MULTIVITAMIN) CAPSULE    Take 1 capsule by mouth daily.     SYNTHROID 125 MCG TABLET    TAKE ONE TABLET BY MOUTH ONCE DAILY   UNABLE TO FIND    Rx: L8000- Post Surgical Bras (Quantity: 6) U7654- Non-Silicone Breast Prosthesis (Quantity: 1) Y5035- Silicone Breast Prosthesis (Quantity: 1) Dx: 174.9; Right Mastectomy   UNABLE TO FIND    Rx: W6568- Silicone Breast Prosthesis (Quantity: 1) **Replacement Dx: 174.9; Right mastectomy   VENLAFAXINE XR (EFFEXOR-XR) 75 MG 24 HR CAPSULE    TAKE ONE CAPSULE BY MOUTH ONCE DAILY  Modified Medications   No medications on file  Discontinued Medications   GABAPENTIN (NEURONTIN) 300 MG CAPSULE    Take 300 mg by mouth daily.

## 2014-10-13 NOTE — Patient Instructions (Signed)
Today we updated your med list in our EPIC system...    Continue your current medications the same...  Please return to our lab one morning this week for your FASTING blood work...    We will contact you w/ the results when available...   Keep up the good work w/ diet & exercise...  Call for any questions...  Let's plan a follow up visit in 34mo, sooner if needed for problems.Marland KitchenMarland Kitchen

## 2014-10-16 ENCOUNTER — Telehealth: Payer: Self-pay | Admitting: Nurse Practitioner

## 2014-10-16 NOTE — Telephone Encounter (Signed)
per GM to move pt GM on call-cld & spoke to pt and gave pt time & date

## 2014-10-19 ENCOUNTER — Other Ambulatory Visit (INDEPENDENT_AMBULATORY_CARE_PROVIDER_SITE_OTHER): Payer: Medicare Other

## 2014-10-19 DIAGNOSIS — I10 Essential (primary) hypertension: Secondary | ICD-10-CM

## 2014-10-19 DIAGNOSIS — E039 Hypothyroidism, unspecified: Secondary | ICD-10-CM

## 2014-10-19 LAB — CBC WITH DIFFERENTIAL/PLATELET
Basophils Absolute: 0.1 10*3/uL (ref 0.0–0.1)
Basophils Relative: 1.1 % (ref 0.0–3.0)
EOS ABS: 0.2 10*3/uL (ref 0.0–0.7)
Eosinophils Relative: 3.1 % (ref 0.0–5.0)
HCT: 41.5 % (ref 36.0–46.0)
Hemoglobin: 14.2 g/dL (ref 12.0–15.0)
LYMPHS ABS: 1.7 10*3/uL (ref 0.7–4.0)
Lymphocytes Relative: 33 % (ref 12.0–46.0)
MCHC: 34.2 g/dL (ref 30.0–36.0)
MCV: 85.5 fl (ref 78.0–100.0)
Monocytes Absolute: 0.5 10*3/uL (ref 0.1–1.0)
Monocytes Relative: 9.1 % (ref 3.0–12.0)
NEUTROS PCT: 53.7 % (ref 43.0–77.0)
Neutro Abs: 2.7 10*3/uL (ref 1.4–7.7)
PLATELETS: 254 10*3/uL (ref 150.0–400.0)
RBC: 4.85 Mil/uL (ref 3.87–5.11)
RDW: 13.7 % (ref 11.5–15.5)
WBC: 5 10*3/uL (ref 4.0–10.5)

## 2014-10-19 LAB — HEPATIC FUNCTION PANEL
ALBUMIN: 4.3 g/dL (ref 3.5–5.2)
ALT: 16 U/L (ref 0–35)
AST: 17 U/L (ref 0–37)
Alkaline Phosphatase: 63 U/L (ref 39–117)
Bilirubin, Direct: 0.1 mg/dL (ref 0.0–0.3)
Total Bilirubin: 0.8 mg/dL (ref 0.2–1.2)
Total Protein: 7.3 g/dL (ref 6.0–8.3)

## 2014-10-19 LAB — LIPID PANEL
CHOLESTEROL: 174 mg/dL (ref 0–200)
HDL: 45.2 mg/dL (ref >39.00)
LDL Cholesterol: 89 mg/dL (ref 0–99)
NonHDL: 128.8
TRIGLYCERIDES: 200 mg/dL — AB (ref 0.0–149.0)
Total CHOL/HDL Ratio: 4
VLDL: 40 mg/dL (ref 0.0–40.0)

## 2014-10-19 LAB — BASIC METABOLIC PANEL
BUN: 17 mg/dL (ref 6–23)
CO2: 28 mEq/L (ref 19–32)
CREATININE: 0.76 mg/dL (ref 0.40–1.20)
Calcium: 9.9 mg/dL (ref 8.4–10.5)
Chloride: 105 mEq/L (ref 96–112)
GFR: 79.48 mL/min (ref >60.00)
Glucose, Bld: 98 mg/dL (ref 70–99)
Potassium: 4.5 mEq/L (ref 3.5–5.1)
Sodium: 138 mEq/L (ref 135–145)

## 2014-10-19 LAB — TSH: TSH: 0.11 u[IU]/mL — ABNORMAL LOW (ref 0.35–4.50)

## 2014-10-21 ENCOUNTER — Other Ambulatory Visit: Payer: Self-pay | Admitting: Pulmonary Disease

## 2014-10-21 MED ORDER — LEVOTHYROXINE SODIUM 112 MCG PO TABS: 112.0000 ug | ORAL_TABLET | Freq: Every day | ORAL | Status: DC

## 2014-11-01 ENCOUNTER — Other Ambulatory Visit: Payer: Self-pay | Admitting: Pulmonary Disease

## 2014-11-03 DIAGNOSIS — Z872 Personal history of diseases of the skin and subcutaneous tissue: Secondary | ICD-10-CM | POA: Diagnosis not present

## 2014-11-03 DIAGNOSIS — Z1283 Encounter for screening for malignant neoplasm of skin: Secondary | ICD-10-CM | POA: Diagnosis not present

## 2014-11-03 DIAGNOSIS — D485 Neoplasm of uncertain behavior of skin: Secondary | ICD-10-CM | POA: Diagnosis not present

## 2014-11-11 DIAGNOSIS — Z853 Personal history of malignant neoplasm of breast: Secondary | ICD-10-CM | POA: Diagnosis not present

## 2014-11-13 ENCOUNTER — Other Ambulatory Visit: Payer: Self-pay | Admitting: *Deleted

## 2014-11-13 DIAGNOSIS — C50411 Malignant neoplasm of upper-outer quadrant of right female breast: Secondary | ICD-10-CM

## 2014-11-16 ENCOUNTER — Other Ambulatory Visit (HOSPITAL_BASED_OUTPATIENT_CLINIC_OR_DEPARTMENT_OTHER): Payer: Medicare Other

## 2014-11-16 DIAGNOSIS — C773 Secondary and unspecified malignant neoplasm of axilla and upper limb lymph nodes: Secondary | ICD-10-CM | POA: Diagnosis not present

## 2014-11-16 DIAGNOSIS — C50411 Malignant neoplasm of upper-outer quadrant of right female breast: Secondary | ICD-10-CM

## 2014-11-16 DIAGNOSIS — C50911 Malignant neoplasm of unspecified site of right female breast: Secondary | ICD-10-CM | POA: Diagnosis present

## 2014-11-16 LAB — CBC WITH DIFFERENTIAL/PLATELET
BASO%: 1.3 % (ref 0.0–2.0)
Basophils Absolute: 0.1 10*3/uL (ref 0.0–0.1)
EOS%: 3.4 % (ref 0.0–7.0)
Eosinophils Absolute: 0.2 10*3/uL (ref 0.0–0.5)
HCT: 39.5 % (ref 34.8–46.6)
HGB: 12.8 g/dL (ref 11.6–15.9)
LYMPH%: 37.5 % (ref 14.0–49.7)
MCH: 28.2 pg (ref 25.1–34.0)
MCHC: 32.5 g/dL (ref 31.5–36.0)
MCV: 86.9 fL (ref 79.5–101.0)
MONO#: 0.6 10*3/uL (ref 0.1–0.9)
MONO%: 10.7 % (ref 0.0–14.0)
NEUT#: 2.6 10*3/uL (ref 1.5–6.5)
NEUT%: 47.1 % (ref 38.4–76.8)
Platelets: 246 10*3/uL (ref 145–400)
RBC: 4.55 10*6/uL (ref 3.70–5.45)
RDW: 13.4 % (ref 11.2–14.5)
WBC: 5.5 10*3/uL (ref 3.9–10.3)
lymph#: 2.1 10*3/uL (ref 0.9–3.3)

## 2014-11-16 LAB — COMPREHENSIVE METABOLIC PANEL (CC13)
ALBUMIN: 4 g/dL (ref 3.5–5.0)
ALT: 17 U/L (ref 0–55)
ANION GAP: 10 meq/L (ref 3–11)
AST: 19 U/L (ref 5–34)
Alkaline Phosphatase: 70 U/L (ref 40–150)
BILIRUBIN TOTAL: 0.72 mg/dL (ref 0.20–1.20)
BUN: 14.9 mg/dL (ref 7.0–26.0)
CALCIUM: 9.5 mg/dL (ref 8.4–10.4)
CO2: 25 mEq/L (ref 22–29)
CREATININE: 0.7 mg/dL (ref 0.6–1.1)
Chloride: 103 mEq/L (ref 98–109)
EGFR: 82 mL/min/{1.73_m2} — AB (ref >90)
Glucose: 87 mg/dl (ref 70–140)
Potassium: 4.6 mEq/L (ref 3.5–5.1)
Sodium: 138 mEq/L (ref 136–145)
TOTAL PROTEIN: 6.9 g/dL (ref 6.4–8.3)

## 2014-11-19 ENCOUNTER — Other Ambulatory Visit: Payer: Self-pay | Admitting: Pulmonary Disease

## 2014-11-20 ENCOUNTER — Telehealth: Payer: Self-pay | Admitting: *Deleted

## 2014-11-20 DIAGNOSIS — C50911 Malignant neoplasm of unspecified site of right female breast: Secondary | ICD-10-CM | POA: Diagnosis not present

## 2014-11-20 NOTE — Telephone Encounter (Signed)
Called patient to reschedule her appointment with [name].  Dr. Donne Hazel is seeing today and so she needs follow up with [place] in about 4 months.  Confirmed new appointment for 03/30/15 at 11am.

## 2014-11-23 ENCOUNTER — Ambulatory Visit: Payer: Medicare Other | Admitting: Oncology

## 2014-11-24 ENCOUNTER — Ambulatory Visit: Payer: Medicare Other | Admitting: Nurse Practitioner

## 2014-12-15 ENCOUNTER — Ambulatory Visit (INDEPENDENT_AMBULATORY_CARE_PROVIDER_SITE_OTHER): Payer: Medicare Other | Admitting: Internal Medicine

## 2014-12-15 ENCOUNTER — Encounter: Payer: Self-pay | Admitting: Internal Medicine

## 2014-12-15 ENCOUNTER — Telehealth: Payer: Self-pay

## 2014-12-15 VITALS — BP 104/60 | HR 84 | Ht 67.5 in | Wt 154.4 lb

## 2014-12-15 DIAGNOSIS — D689 Coagulation defect, unspecified: Secondary | ICD-10-CM | POA: Diagnosis not present

## 2014-12-15 DIAGNOSIS — Z86010 Personal history of colonic polyps: Secondary | ICD-10-CM | POA: Diagnosis not present

## 2014-12-15 DIAGNOSIS — G452 Multiple and bilateral precerebral artery syndromes: Secondary | ICD-10-CM

## 2014-12-15 DIAGNOSIS — Z800 Family history of malignant neoplasm of digestive organs: Secondary | ICD-10-CM

## 2014-12-15 MED ORDER — PEG-KCL-NACL-NASULF-NA ASC-C 100 G PO SOLR: 1.0000 | Freq: Once | ORAL | Status: DC

## 2014-12-15 NOTE — Patient Instructions (Addendum)
You have been scheduled for a colonoscopy. Please follow written instructions given to you at your visit today.  Please pick up your prep supplies at the pharmacy within the next 1-3 days. If you use inhalers (even only as needed), please bring them with you on the day of your procedure. Your physician has requested that you go to www.[email or web address].com and enter the access code given to you at your visit today. This web site gives a general overview about your procedure. However, you should still follow specific instructions given to you by our office regarding your preparation for the procedure.  You will be contacted by our office prior to your procedure for directions on holding your Plavix.  If you do not hear from our office 1 week prior to your scheduled procedure, please call [phone] to discuss.     Cc:Dr [name], Dr Jana Hakim

## 2014-12-15 NOTE — Telephone Encounter (Signed)
  12/15/2014   RE: Debra Mcintyre DOB: [birth year] MRN: [id number]   Dear Dr. [name],    We have scheduled the above patient for an endoscopic procedure. Our records show that she is on anticoagulation therapy.   Please advise as to how long the patient may come off her therapy of Plavix prior to the procedure, which is scheduled for 02/10/15.  Please route this message to Marlon Pel, CMA  Sincerely,    Marlon Pel  [name], MD

## 2014-12-15 NOTE — Progress Notes (Signed)
Debra Mcintyre 1941-01-26 [id number]  Note: This dictation was prepared with Dragon digital system. Any transcriptional errors that result from this procedure are unintentional.   History of Present Illness: This is a 74 year old female whi is here  to discuss recall colonoscopy. She has a positive family history of colon cancer in her brother and maternal uncle and aunt. and ulcerative colitis in her father. She had 2 tubular adenomas on last colonoscopy in October 2010. First colonoscopy in 2000 was normal. She has a metastatic breast carcinoma followed by Dr. Jana Mcintyre . She underwent chemotherapy, mastectomy and radiation. 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 She has been on Plavix 75 mg daily for about 10 years. She has in the past stoped taking Plavix for several days prior to surgical procedures    Past Medical History  Diagnosis Date  . Cerebrovascular disease, unspecified   . Cerebral aneurysm, nonruptured   . Pure hypercholesterolemia   . Hypothyroidism   . Colonic polyp   . History of chemotherapy   . TIA (transient ischemic attack)   . Hypertension     Does not see a cardiologist  . Neuropathy   . Breast cancer 09/2012    right breast/right axillary lymph node t2,pn1, stage 11b, invasive ductal carcinma, grade 3, triple negative, with an mib-1 of 15%  . Hx of radiation therapy 05/27/13-08/13/13    right chest wall/right supraclavicular/axillary region 5220 cGy 29 sessions, right mastectomy/chest wall boost cGy 5 sessions    Past Surgical History  Procedure Laterality Date  . Left middle cerebral artery angioplasty  2004    by [name] had 2 follow up occurances  . Portacath placement N/A 11/05/2012    Procedure: INSERTION PORT-A-CATH;  Surgeon: Debra Bookbinder, MD;  Location: [facility];  Service: General;  Laterality: N/A;  . Breast biopsy Right 10/23/2012  . Breast biopsy Right 10/08/2012  . Mastectomy w/ sentinel node  biopsy Right 04/23/2013    Procedure: RIGHT TOTAL MASTECTOMY WITH SENTINEL LYMPH NODE BIOPSY;  Surgeon: Debra Bookbinder, MD;  Location: [facility];  Service: General;  Laterality: Right;    No Known Allergies  Family history and social history have been reviewed.  Review of Systems:   The remainder of the 10 point ROS is negative except as outlined in the H&P  Physical Exam: General Appearance Well developed, in no distress Eyes  Non icteric  HEENT  Non traumatic, normocephalic  Mouth No lesion, tongue papillated, no cheilosis Neck Supple without adenopathy, thyroid not enlarged, no carotid bruits, no JVD Lungs Clear to auscultation bilaterally COR Normal S1, normal S2, regular rhythm, no murmur, quiet precordium Abdomen soft nontender with normoactive bowel sounds. No distention. Liver edge at costal margin Rectal not done Extremities  No pedal edema Skin No lesions Neurological Alert and oriented x 3 Psychological Normal mood and affect  Assessment and Plan:   74 year old white female due for recall colonoscopy. Strong family history of colon cancer in an aunt, maternal uncle, brother. Father with ulcerative colitis. Personal history of 2 tubular adenomas on last colonoscopy in 2010. She wants to go ahead with colonoscopy. Plavix has been discontinued in the past prior to surgical procedures. We will request permission to hold Plavix for 5 days from Dr. Lenna Mcintyre.   Story of gastritis on upper endoscopy in 1984. Currently no upper GI symptoms.  Metastatic breast cancer followed by [name].     Debra Mcintyre 12/15/2014

## 2014-12-16 ENCOUNTER — Telehealth: Payer: Self-pay | Admitting: *Deleted

## 2014-12-16 MED ORDER — NA SULFATE-K SULFATE-MG SULF 17.5-3.13-1.6 GM/177ML PO SOLN: 1.0000 | Freq: Once | ORAL | Status: AC

## 2014-12-16 NOTE — Telephone Encounter (Signed)
Printed out new instructions for Suprep. Called the patient at [phone] (Home #) to let them know their insurance would not cover Moviprep and that I sent over Roseto. I also told the patient that I have new instructions that I need to go over with her before her colonoscopy on 02/10/15 with Dr. Olevia Perches. Patient stated she understood.

## 2014-12-16 NOTE — Telephone Encounter (Signed)
Patient's insurance will not cover Moviprep. Sent over Rx for [employer] to [facility] in [place], [place].

## 2014-12-18 NOTE — Telephone Encounter (Signed)
Per SN, pt informed that plavix needs to be stopped  Days prior to procedure. Pt voiced understanding of instructions. Nothing further is needed at this time.

## 2014-12-22 ENCOUNTER — Other Ambulatory Visit: Payer: Self-pay | Admitting: Pulmonary Disease

## 2014-12-25 ENCOUNTER — Encounter: Payer: Self-pay | Admitting: Oncology

## 2014-12-30 ENCOUNTER — Other Ambulatory Visit: Payer: Self-pay | Admitting: Pulmonary Disease

## 2015-01-12 ENCOUNTER — Other Ambulatory Visit: Payer: Self-pay | Admitting: Pulmonary Disease

## 2015-01-14 NOTE — Telephone Encounter (Signed)
Called the patient at [phone] (Home #) to see when they wanted to come in to go over their new instructions for their Colonoscopy, which will be on February 10, 2015 at 2:00 pm. Patient stated that they would come in on Tuesday, June 21st between 8 am to 5 pm.

## 2015-02-02 ENCOUNTER — Other Ambulatory Visit: Payer: Self-pay | Admitting: Pulmonary Disease

## 2015-02-02 NOTE — Telephone Encounter (Signed)
Explained instructions on how to take suprep for their colonoscopy, which is on 02/10/15. Verified that patient could stop taking their Plavix as of this Friday, February 05, 2015. Patient stated they understood.

## 2015-02-02 NOTE — Telephone Encounter (Signed)
Debra Mcintyre notified patient per Dr. Jeannine Kitten recommendations to hold Plavix 5 days prior to her procedure. Pt verbalized understanding.

## 2015-02-08 ENCOUNTER — Other Ambulatory Visit: Payer: Self-pay

## 2015-02-10 ENCOUNTER — Ambulatory Visit (AMBULATORY_SURGERY_CENTER): Payer: Medicare Other | Admitting: Internal Medicine

## 2015-02-10 ENCOUNTER — Encounter: Payer: Self-pay | Admitting: Internal Medicine

## 2015-02-10 VITALS — BP 129/70 | HR 57 | Temp 96.9°F | Resp 20 | Ht 67.0 in | Wt 154.0 lb

## 2015-02-10 DIAGNOSIS — I10 Essential (primary) hypertension: Secondary | ICD-10-CM | POA: Diagnosis not present

## 2015-02-10 DIAGNOSIS — Z86010 Personal history of colonic polyps: Secondary | ICD-10-CM | POA: Diagnosis not present

## 2015-02-10 DIAGNOSIS — D126 Benign neoplasm of colon, unspecified: Secondary | ICD-10-CM

## 2015-02-10 DIAGNOSIS — Z1211 Encounter for screening for malignant neoplasm of colon: Secondary | ICD-10-CM | POA: Diagnosis not present

## 2015-02-10 DIAGNOSIS — D124 Benign neoplasm of descending colon: Secondary | ICD-10-CM

## 2015-02-10 MED ORDER — SODIUM CHLORIDE 0.9 % IV SOLN: 500.0000 mL | INTRAVENOUS | Status: DC

## 2015-02-10 NOTE — Op Note (Addendum)
[place]  [address] [place], [address]   COLONOSCOPY PROCEDURE REPORT  PATIENT: Debra Mcintyre, Debra Mcintyre  MR#: [id number] BIRTHDATE: 1940/12/04 , 74  yrs. old GENDER: female ENDOSCOPIST: Lafayette Dragon, MD REFERRED SW:FUXNA [name], M.D. PROCEDURE DATE:  02/10/2015 PROCEDURE:   Colonoscopy, screening and Colonoscopy with cold biopsy polypectomy First Screening Colonoscopy - Avg.  risk and is 50 yrs.  old or older - No.  Prior Negative Screening - Now for repeat screening. N/A  History of Adenoma - Now for follow-up colonoscopy & has been > or = to 3 yrs.  Yes hx of adenoma.  Has been 3 or more years since last colonoscopy.  Polyps removed today? Yes ASA CLASS:   Class II INDICATIONS:Surveillance due to prior colonic neoplasia, FH Colon or Rectal Adenocarcinoma, and brother with colon cancer.  Tubular adenoma removed on last colonoscopy November 2010.  Father had ulcerative colitis. MEDICATIONS: Monitored anesthesia care and Propofol 400 mg IV  DESCRIPTION OF PROCEDURE:   After the risks benefits and alternatives of the procedure were thoroughly explained, informed consent was obtained.  The digital rectal exam revealed no abnormalities of the rectum.   The LB [id number]-[id number]  endoscope was introduced through the anus and advanced to the cecum, which was identified by both the appendix and ileocecal valve. No adverse events experienced.   The quality of the prep was good.  (MoviPrep was used)  The instrument was then slowly withdrawn as the colon was fully examined. Estimated blood loss is zero unless otherwise noted in this procedure report.      COLON FINDINGS: A sessile polyp measuring 3 mm in size was found in the descending colon.  A polypectomy was performed with cold forceps.  The resection was complete, the polyp tissue was completely retrieved and sent to histology.  Retroflexed views revealed no abnormalities. The time to cecum = 13.25  Withdrawal time = 8.22   The scope was withdrawn and the procedure completed. COMPLICATIONS: There were no immediate complications.  ENDOSCOPIC IMPRESSION: Sessile polyp was found in the descending colon; polypectomy was performed with cold forceps  RECOMMENDATIONS: 1.  Await pathology results 2.  High-fiber diet 3.Recall colonoscopy pending path report, probably 5 years 4.resume Plavix tomorrow eSigned:  Lafayette Dragon, MD 02/10/2015 2:56 PM Revised: 02/10/2015 2:56 PM  cc:

## 2015-02-10 NOTE — Progress Notes (Signed)
Called to room to assist during endoscopic procedure.  Patient ID and intended procedure confirmed with present staff. Received instructions for my participation in the procedure from the performing physician.  

## 2015-02-10 NOTE — Progress Notes (Signed)
Patient awakening,vss,report to rn 

## 2015-02-10 NOTE — Patient Instructions (Addendum)
YOU HAD AN ENDOSCOPIC PROCEDURE TODAY AT [facility]:   Refer to the procedure report that was given to you for any specific questions about what was found during the examination.  If the procedure report does not answer your questions, please call your gastroenterologist to clarify.  If you requested that your care partner not be given the details of your procedure findings, then the procedure report has been included in a sealed envelope for you to review at your convenience later.  YOU SHOULD EXPECT: Some feelings of bloating in the abdomen. Passage of more gas than usual.  Walking can help get rid of the air that was put into your GI tract during the procedure and reduce the bloating. If you had a lower endoscopy (such as a colonoscopy or flexible sigmoidoscopy) you may notice spotting of blood in your stool or on the toilet paper. If you underwent a bowel prep for your procedure, you may not have a normal bowel movement for a few days.  Please Note:  You might notice some irritation and congestion in your nose or some drainage.  This is from the oxygen used during your procedure.  There is no need for concern and it should clear up in a day or so.  SYMPTOMS TO REPORT IMMEDIATELY:   Following lower endoscopy (colonoscopy or flexible sigmoidoscopy):  Excessive amounts of blood in the stool  Significant tenderness or worsening of abdominal pains  Swelling of the abdomen that is new, acute  Fever of 100F or higher  For urgent or emergent issues, a gastroenterologist can be reached at any hour by calling [phone].   DIET: Your first meal following the procedure should be a small meal and then it is ok to progress to your normal diet. Heavy or fried foods are harder to digest and may make you feel nauseous or bloated.  Likewise, meals heavy in dairy and vegetables can increase bloating.  Drink plenty of fluids but you should avoid alcoholic beverages for 24  hours.  ACTIVITY:  You should plan to take it easy for the rest of today and you should NOT DRIVE or use heavy machinery until tomorrow (because of the sedation medicines used during the test).    FOLLOW UP: Our staff will call the number listed on your records the next business day following your procedure to check on you and address any questions or concerns that you may have regarding the information given to you following your procedure. If we do not reach you, we will leave a message.  However, if you are feeling well and you are not experiencing any problems, there is no need to return our call.  We will assume that you have returned to your regular daily activities without incident.  If any biopsies were taken you will be contacted by phone or by letter within the next 1-3 weeks.  Please call us at [phone] if you have not heard about the biopsies in 3 weeks.    SIGNATURES/CONFIDENTIALITY: You and/or your care partner have signed paperwork which will be entered into your electronic medical record.  These signatures attest to the fact that that the information above on your After Visit Summary has been reviewed and is understood.  Full responsibility of the confidentiality of this discharge information lies with you and/or your care-partner.  Polyp, high fiber diet-handouts given  Repeat colonoscopy in 5 years 2021.  Resume plavix tomorrow 02/11/15-adm

## 2015-02-11 ENCOUNTER — Telehealth: Payer: Self-pay

## 2015-02-11 NOTE — Telephone Encounter (Signed)
Left message on answering machine. 

## 2015-02-16 ENCOUNTER — Other Ambulatory Visit: Payer: Self-pay | Admitting: Pulmonary Disease

## 2015-02-16 ENCOUNTER — Encounter: Payer: Self-pay | Admitting: Internal Medicine

## 2015-02-21 ENCOUNTER — Other Ambulatory Visit: Payer: Self-pay | Admitting: Pulmonary Disease

## 2015-03-15 ENCOUNTER — Other Ambulatory Visit: Payer: Self-pay

## 2015-03-15 DIAGNOSIS — Z1231 Encounter for screening mammogram for malignant neoplasm of breast: Secondary | ICD-10-CM

## 2015-03-22 ENCOUNTER — Other Ambulatory Visit: Payer: Self-pay | Admitting: Pulmonary Disease

## 2015-03-24 ENCOUNTER — Ambulatory Visit: Payer: Medicare Other

## 2015-03-29 ENCOUNTER — Ambulatory Visit
Admission: RE | Admit: 2015-03-29 | Discharge: 2015-03-29 | Disposition: A | Discharge status: Home / Self Care | Payer: Medicare Other | Source: Ambulatory Visit

## 2015-03-29 DIAGNOSIS — Z1231 Encounter for screening mammogram for malignant neoplasm of breast: Secondary | ICD-10-CM

## 2015-03-30 ENCOUNTER — Ambulatory Visit (HOSPITAL_BASED_OUTPATIENT_CLINIC_OR_DEPARTMENT_OTHER): Payer: Medicare Other | Admitting: Oncology

## 2015-03-30 ENCOUNTER — Ambulatory Visit (HOSPITAL_BASED_OUTPATIENT_CLINIC_OR_DEPARTMENT_OTHER): Payer: Medicare Other

## 2015-03-30 VITALS — BP 122/67 | HR 84 | Temp 97.7°F | Resp 17 | Ht 67.0 in | Wt 160.2 lb

## 2015-03-30 DIAGNOSIS — C50411 Malignant neoplasm of upper-outer quadrant of right female breast: Secondary | ICD-10-CM

## 2015-03-30 DIAGNOSIS — M810 Age-related osteoporosis without current pathological fracture: Secondary | ICD-10-CM

## 2015-03-30 DIAGNOSIS — C773 Secondary and unspecified malignant neoplasm of axilla and upper limb lymph nodes: Secondary | ICD-10-CM | POA: Diagnosis not present

## 2015-03-30 DIAGNOSIS — G609 Hereditary and idiopathic neuropathy, unspecified: Secondary | ICD-10-CM

## 2015-03-30 LAB — COMPREHENSIVE METABOLIC PANEL (CC13)
ALK PHOS: 59 U/L (ref 40–150)
ALT: 24 U/L (ref 0–55)
ANION GAP: 8 meq/L (ref 3–11)
AST: 22 U/L (ref 5–34)
Albumin: 4.1 g/dL (ref 3.5–5.0)
BILIRUBIN TOTAL: 0.78 mg/dL (ref 0.20–1.20)
BUN: 13.8 mg/dL (ref 7.0–26.0)
CALCIUM: 9.9 mg/dL (ref 8.4–10.4)
CO2: 25 mEq/L (ref 22–29)
CREATININE: 0.8 mg/dL (ref 0.6–1.1)
Chloride: 104 mEq/L (ref 98–109)
EGFR: 77 mL/min/{1.73_m2} — ABNORMAL LOW (ref >90)
Glucose: 90 mg/dl (ref 70–140)
Potassium: 4.8 mEq/L (ref 3.5–5.1)
Sodium: 137 mEq/L (ref 136–145)
TOTAL PROTEIN: 7.3 g/dL (ref 6.4–8.3)

## 2015-03-30 LAB — CBC WITH DIFFERENTIAL/PLATELET
BASO%: 1.4 % (ref 0.0–2.0)
Basophils Absolute: 0.1 10*3/uL (ref 0.0–0.1)
EOS%: 2.3 % (ref 0.0–7.0)
Eosinophils Absolute: 0.2 10*3/uL (ref 0.0–0.5)
HEMATOCRIT: 41.7 % (ref 34.8–46.6)
HGB: 14.1 g/dL (ref 11.6–15.9)
LYMPH#: 1.9 10*3/uL (ref 0.9–3.3)
LYMPH%: 28.7 % (ref 14.0–49.7)
MCH: 29.8 pg (ref 25.1–34.0)
MCHC: 33.7 g/dL (ref 31.5–36.0)
MCV: 88.3 fL (ref 79.5–101.0)
MONO#: 0.6 10*3/uL (ref 0.1–0.9)
MONO%: 8.4 % (ref 0.0–14.0)
NEUT%: 59.2 % (ref 38.4–76.8)
NEUTROS ABS: 4 10*3/uL (ref 1.5–6.5)
PLATELETS: 265 10*3/uL (ref 145–400)
RBC: 4.72 10*6/uL (ref 3.70–5.45)
RDW: 13.9 % (ref 11.2–14.5)
WBC: 6.8 10*3/uL (ref 3.9–10.3)

## 2015-03-30 MED ORDER — GABAPENTIN 300 MG PO CAPS: 300.0000 mg | ORAL_CAPSULE | Freq: Every day | ORAL | Status: DC

## 2015-03-30 NOTE — Progress Notes (Signed)
ID: Debra Mcintyre   DOB: June 25, 1941  MR#: [id number]  WUJ#:[id number]  PCP: [name], MD GYN: [name] SU: [name] OTHER MD: Arloa Koh, [name], Lillia Mountain, Clydell Hakim,   HISTORY OF PRESENT ILLNESS: From the original intake note:  Debra Mcintyre had screening mammography at [facility] 04/10/2012 raising the question of some axillary lymph nodes on the right. Additional views of the right axilla and right axillary ultrasound performed 04/04/2012 showed 3 lymph nodes that appeared larger than prior. The largest measured 1.5 cm. 2 of them had cortical thickening. Close followup was suggested, and repeat right axillary ultrasound 06/25/2012 showed no significant change in the lymph nodes in question. Followup ultrasound in 3 months was recommended, but in the interim the patient saw her primary physician, [name] and ADL, and he set her up for a chest CT on 07/08/2012, which showed a mild asymmetric density in the upper mid right breast. There was a prominent right axillary lymph node measuring 1.9 cm, with a few smaller adjacent nodes asymmetric with compared to the left side. Again, short interval followup was recommended, and on 10/07/2012 the patient had digital right mammography and ultrasonography, now at the breast Center. Dr. [name] noted pleomorphic calcifications spanning an area of 6.3 cm without associated mass. Physical exam was unremarkable. Ultrasound showed an area of adenopathy in the right axilla measuring 1.6 cm, with a second lymph node with a thickened cortex measuring 1.2 cm. No suspicious mass was seen by ultrasound in the right breast.  Biopsy of the larger axillary lymph node on 10/07/2012 showed ([id number]) an invasive ductal carcinoma, triple negative, with an MIB-1 of 66%. Biopsy of the right breast the next day, [id number]) showed invasive ductal carcinoma, grade 3. Breast MRI 10/14/2012 showed a total area of irregular enhancement in the right breast  measuring up to 12 cm. MRI guided biopsy of an area in the upper inner quadrant of the right breast on 10/23/2012 showed ([id number]) ductal carcinoma in situ, with foci worrisome for invasion.  The patient's subsequent history is as detailed below  INTERVAL HISTORY: Debra Mcintyre returns today for followup of her locally advanced right breast cancer. The interval history is unremarkable as far as breast cancer is concerned. She did recently lose her dog of 14 years and she is actively and appropriately grieving.  REVIEW OF SYSTEMS: She has some stiffness in her hands when she drives long distances which might indicate early carpal tunnel. She does have neuropathy in both feet, involving the toes. This is worse when she walks a long distance. It is very intermittent otherwise. She has tried gabapentin for other reasons in the past and tolerated it well, so we discussed that in this setting. Aside from that she has some tenderness issues in the palmar aspect of the right hand, which are benign. She has low back pain which is chronic and not more intense or persistent than before. She feels a little forgetful at times. Otherwise a detailed review of systems today was stable  PAST MEDICAL HISTORY: Past Medical History  Diagnosis Date  . Cerebrovascular disease, unspecified   . Cerebral aneurysm, nonruptured   . Pure hypercholesterolemia   . Hypothyroidism   . Colonic polyp   . History of chemotherapy   . TIA (transient ischemic attack)   . Hypertension     Does not see a cardiologist  . Neuropathy   . Breast cancer 09/2012    right breast/right axillary lymph node t2,pn1, stage 11b,  invasive ductal carcinma, grade 3, triple negative, with an mib-1 of 15%  . Hx of radiation therapy 05/27/13-08/13/13    right chest wall/right supraclavicular/axillary region 5220 cGy 29 sessions, right mastectomy/chest wall boost cGy 5 sessions    PAST SURGICAL HISTORY: Past Surgical History  Procedure Laterality  Date  . Left middle cerebral artery angioplasty  2004    by [name] had 2 follow up occurances  . Portacath placement N/A 11/05/2012    Procedure: INSERTION PORT-A-CATH;  Surgeon: [name], MD;  Location: [facility];  Service: General;  Laterality: N/A;  . Breast biopsy Right 10/23/2012  . Breast biopsy Right 10/08/2012  . Mastectomy w/ sentinel node biopsy Right 04/23/2013    Procedure: RIGHT TOTAL MASTECTOMY WITH SENTINEL LYMPH NODE BIOPSY;  Surgeon: [name], MD;  Location: [facility];  Service: General;  Laterality: Right;    FAMILY HISTORY Family History  Problem Relation Age of Onset  . Lung cancer Mother 87  . Heart disease Father   . Colon cancer Brother 51  . Alcohol abuse Brother   . Colon cancer Maternal Uncle     dx in his 34s  . Colon cancer Maternal Aunt   . Ovarian cancer Other 17  . Lung cancer Maternal Aunt   . Breast cancer Sister    the patient's father died at the age of 65. The patient's mother died at the age of 50 from lung cancer. She was not a smoker. The cancer was diagnosed when she was 74 years old. The patient's mother had 2 sisters and one brother. One of those sisters had lung cancer and a brother had colon cancer, both diagnosed in their 73s. The patient herself has one brother who died from colon cancer at the age of 23. That brother has a daughter who was diagnosed with uterine cancer at the age of 31. All this raises the question of possible Lynch syndrome and I have referred the patient for genetics counseling. The patient does have one sister who was diagnosed with breast cancer in 2015.  GYNECOLOGIC HISTORY: Menarche age 89, menopause around 89. The patient is GX P0. She took birth control pills for many years with no complications.  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 Her stepchildren are [name] who lives in "[place]" and is financial vice president of [employer], and [place] who lives in [place] and works as a Tree surgeon. The patient has 5 grandchildren. She is currently not a church attender   ADVANCED DIRECTIVES: Not in place; this was discussed 10/29/2012 and the patient was advised to complete a healthcare power of attorney document  HEALTH MAINTENANCE: Social History  Substance Use Topics  . Smoking status: Former Smoker -- 1.00 packs/day for 20 years    Types: Cigarettes    Quit date: 04/02/1986  . Smokeless tobacco: Never Used  . Alcohol Use: No     Colonoscopy: 02/10/2015  PAP: 2012  Bone density:  Lipid panel:  No Known Allergies  Current Outpatient Prescriptions  Medication Sig Dispense Refill  . aspirin 325 MG tablet Take 325 mg by mouth daily.      . Calcium Carbonate-Vitamin D (CALCIUM + D PO) Take 1 tablet by mouth 2 (two) times daily.    . clopidogrel (PLAVIX) 75 MG tablet TAKE ONE TABLET BY MOUTH ONCE DAILY. 30 tablet 3  . CRESTOR 20 MG tablet TAKE  ONE TABLET BY MOUTH ONCE DAILY 30 tablet 0  . CRESTOR 20 MG tablet TAKE ONE TABLET BY MOUTH ONCE DAILY 30 tablet 6  . levothyroxine (SYNTHROID, LEVOTHROID) 112 MCG tablet Take 1 tablet (112 mcg total) by mouth daily before breakfast. 90 tablet 3  . lisinopril (PRINIVIL,ZESTRIL) 20 MG tablet TAKE ONE TABLET BY MOUTH ONCE DAILY 30 tablet 3  . metoprolol succinate (TOPROL-XL) 100 MG 24 hr tablet TAKE ONE TABLET BY MOUTH ONCE DAILY 30 tablet 3  . metroNIDAZOLE (METROCREAM) 0.75 % cream Apply 1 application topically 2 (two) times daily. As directed    . Multiple Vitamin (MULTIVITAMIN) capsule Take 1 capsule by mouth daily.      Mar[facility] Kitchen UNABLE TO FIND Rx: L8000- Post Surgical Bras (Quantity: 6) P7106- Non-Silicone Breast Prosthesis (Quantity: 1) Y6948- Silicone Breast Prosthesis (Quantity: 1) Dx: 174.9; Right Mastectomy 1 each 0  . UNABLE TO FIND Rx: [id number]- Silicone Breast Prosthesis (Quantity: 1) **Replacement Dx: 174.9; Right  mastectomy 1 each 0  . venlafaxine XR (EFFEXOR-XR) 75 MG 24 hr capsule TAKE ONE CAPSULE BY MOUTH ONCE DAILY 30 capsule 3   No current facility-administered medications for this visit.    OBJECTIVE: Middle-aged white woman in no acute distress Filed Vitals:   03/30/15 1105  BP: 122/67  Pulse: 84  Temp: 97.7 F (36.5 C)  Resp: 17     Body mass index is 25.08 kg/(m^2).    ECOG FS: 0 Filed Weights   03/30/15 1105  Weight: 160 lb 3.2 oz (72.666 kg)   Sclerae unicteric, pupils round and equal Oropharynx clear and moist-- no thrush or other lesions No cervical or supraclavicular adenopathy Lungs no rales or rhonchi Heart regular rate and rhythm Abd soft, nontender, positive bowel sounds MSK no focal spinal tenderness, no upper extremity lymphedema Neuro: nonfocal, well oriented, appropriate affect Breasts: The right breast is status post mastectomy. There is no evidence of chest wall recurrence. The right axilla is benign per the left breast is unremarkable    LAB RESULTS: Lab Results  Component Value Date   WBC 5.5 11/16/2014   NEUTROABS 2.6 11/16/2014   HGB 12.8 11/16/2014   HCT 39.5 11/16/2014   MCV 86.9 11/16/2014   PLT 246 11/16/2014      Chemistry      Component Value Date/Time   NA 138 11/16/2014 1435   NA 138 10/19/2014 1051   K 4.6 11/16/2014 1435   K 4.5 10/19/2014 1051   CL 105 10/19/2014 1051   CL 102 01/28/2013 0909   CO2 25 11/16/2014 1435   CO2 28 10/19/2014 1051   BUN 14.9 11/16/2014 1435   BUN 17 10/19/2014 1051   CREATININE 0.7 11/16/2014 1435   CREATININE 0.76 10/19/2014 1051      Component Value Date/Time   CALCIUM 9.5 11/16/2014 1435   CALCIUM 9.9 10/19/2014 1051   ALKPHOS 70 11/16/2014 1435   ALKPHOS 63 10/19/2014 1051   AST 19 11/16/2014 1435   AST 17 10/19/2014 1051   ALT 17 11/16/2014 1435   ALT 16 10/19/2014 1051   BILITOT 0.72 11/16/2014 1435   BILITOT 0.8 10/19/2014 1051       STUDIES: Mm Digital Screening Unilat  L  03/30/2015   CLINICAL DATA:  Screening.  EXAM: DIGITAL SCREENING UNILATERAL LEFT MAMMOGRAM WITH CAD  COMPARISON:  Previous exam(s).  ACR Breast Density Category c: The breast tissue is heterogeneously dense, which may obscure small masses.  FINDINGS: There are no findings suspicious for malignancy. There has  been a right mastectomy. Images were processed with CAD.  IMPRESSION: No mammographic evidence of malignancy. A result letter of this screening mammogram will be mailed directly to the patient.  RECOMMENDATION: Screening mammogram in one year. (Code:SM-B-01Y)  BI-RADS CATEGORY  1: Negative.   Electronically Signed   By: Altamese Cabal M.D.   On: 03/30/2015 09:28     ASSESSMENT: 74 y.o. [place] woman   (1)  status post right breast and right axillary lymph node biopsies February 2014 of a clinical T2, pN1, stage IIB  invasive ductal carcinoma, grade 3, triple negative, with an MIB-1 of 15%  (2)  a third biopsy from a different quadrant 10/23/2012 showed ductal carcinoma in situ  (3)  treated in the neoadjuvant setting with 4 dose dense cycles of doxorubicin/ cyclophosphamide 12/23/2012, followed by 7 weekly doses of paclitaxel and completed 03/03/2013.  (4) status post right mastectomy and sentinel lymph node sampling 04/23/2013 for a residual pT1b pN1 invasive ductal carcinoma, grade 3, again triple negative [2/10 sample lymph nodes were involved by tumor, both with micrometastatic deposits]  (a) the patient opted against reconstruction  (5) adjuvant radiation completed 08/13/2013  (6) the patient underwent genetics counseling but declined testing   PLAN:  Binta continues to do well, with no evidence of disease recurrence now 2 years out from her definitive surgery. Because triple negative breast cancers, when they recur, tend to recur early, this is particularly good news.  We discussed the dog issue at length and I suggested she read "inside of a dog". She will give it a  try.  As far as the peripheral neuropathy in her toes is concerned, she will start gabapentin 300 mg at bedtime. If she does well with that she will call and we will give her gabapentin 100 mg to take twice a day in addition. If that does not work we will try acupuncture.  She will see Dr. Donne Hazel in 6 months. She will see me again in one year. I urged her to call with any problems that might develop before that visit.  Chauncey Cruel, MD    03/30/2015

## 2015-03-31 ENCOUNTER — Telehealth: Payer: Self-pay | Admitting: Oncology

## 2015-03-31 NOTE — Telephone Encounter (Signed)
Left message with appointment for August 2017. Mailed calendar

## 2015-04-15 ENCOUNTER — Ambulatory Visit: Payer: PRIVATE HEALTH INSURANCE | Admitting: Pulmonary Disease

## 2015-04-20 ENCOUNTER — Ambulatory Visit (INDEPENDENT_AMBULATORY_CARE_PROVIDER_SITE_OTHER): Payer: PRIVATE HEALTH INSURANCE | Admitting: Pulmonary Disease

## 2015-04-20 ENCOUNTER — Ambulatory Visit (INDEPENDENT_AMBULATORY_CARE_PROVIDER_SITE_OTHER)
Admission: RE | Admit: 2015-04-20 | Discharge: 2015-04-20 | Disposition: A | Discharge status: Home / Self Care | Payer: Medicare Other | Source: Ambulatory Visit | Attending: Pulmonary Disease | Admitting: Pulmonary Disease

## 2015-04-20 ENCOUNTER — Encounter: Payer: Self-pay | Admitting: Pulmonary Disease

## 2015-04-20 VITALS — BP 124/76 | HR 80 | Temp 97.0°F | Wt 161.2 lb

## 2015-04-20 DIAGNOSIS — I679 Cerebrovascular disease, unspecified: Secondary | ICD-10-CM | POA: Diagnosis not present

## 2015-04-20 DIAGNOSIS — Z23 Encounter for immunization: Secondary | ICD-10-CM | POA: Diagnosis not present

## 2015-04-20 DIAGNOSIS — C50411 Malignant neoplasm of upper-outer quadrant of right female breast: Secondary | ICD-10-CM

## 2015-04-20 DIAGNOSIS — J4520 Mild intermittent asthma, uncomplicated: Secondary | ICD-10-CM | POA: Diagnosis not present

## 2015-04-20 DIAGNOSIS — I10 Essential (primary) hypertension: Secondary | ICD-10-CM | POA: Diagnosis not present

## 2015-04-20 DIAGNOSIS — Z853 Personal history of malignant neoplasm of breast: Secondary | ICD-10-CM | POA: Diagnosis not present

## 2015-04-20 DIAGNOSIS — G452 Multiple and bilateral precerebral artery syndromes: Secondary | ICD-10-CM

## 2015-04-20 NOTE — Progress Notes (Addendum)
Subjective:    Patient ID: Debra Mcintyre, female    DOB: [birth year], 73 y.o.   MRN: [id number]  HPI 74 y/o WF here for a follow up visit... she has multiple medical problems as noted below...  ~  SEE PREV EPIC NOTES FOR OLDER DATA>>   ~  March 2. 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 She developed back & hip pain while getting her XRT- eval by DrCaffrey w/ ?HNP, no sign of mets, given epid steroid shot which really helped...  She has some paresthesias & tried Neurontin, stopped this on her own...     HBP> on MetopER100, Lisin20; BP= 112/64 & she denies CP, palpit, SOB, edema...    Cerebrovasc Dis> on ASFBP102 Plavix75; s/p aneurysm & TIA; stable w/o cerebral ischemic symptoms- she has not had f/u DrDeveshwar-IR- she notes she was told no need for f/u angiography (we might consider MRI later)...    Chol> on Cres20; FLP showed TChol 164, TG 116, HDL 49, LDL 92; rec to continue same med & diet efforts...    Hypothyroid> on Synthroid 12523md; TSH= 0.35 & she is clinically euthyroid, continue same...    Colon polyps> last colon was 11/10 w/ one adenomatous polyp removed, f/u planned 24yr61yr  Breast cancer> followed by DrMagrinat, DrWakefield, DrMurray w/ prev surg, chemoRx and XRT- notes reviewed...    DJD, FM, Osteop> on Calcium, MVI, OTC analgesics; BMD followed by her GYN...    Anxiety> on EffexorXR75 and she reports stable on this med...  We reviewed prob list, meds, xrays and labs> see below for updates >>   CXR 3/15 showed borderline heart size, left sided port, new RUL airsp dis (could be fibrosis from XRT), scoliosis & lower Tspine compression...  CT Chest 3/15 showed subpleural airspace  consolidation and ground-glass in the anterior aspect of the right upper and right middle lobes indicative of radiation therapy; post op changes in right breast & axila; incidental 8mm 46mule in RMLunchanged from 2013, no adenopathy, atherosclerotic calcif noted...   LABS 3/15:  FLP- at goals on Cres20;  Chems- wnl;  CBC- wnl w/ Hg=12.6;  TSH=0.35 on Synthroid125...   ~  April 16, 2014:  53mo R81mo Foy reports a good interval- she informs me that she has decided against reconstruction & is very comfortable w/ her decision;  She notes some fall allergies & is using Zyrtek prn;  She gets exercise by walking the dog & will join a Y program soon;  She has had f/u visits w/ DrSanger, DrWakefield, & DrMagrinat in the interval- stable & doing satis, f/u left mammogram 6/15 (she is s/p right mastectomy) and it was neg... We reviewed the following medical problems during today's office visit >>     AbnCXR> CXR 3/15 showed new RUL airspace dis & CTChest 3/15 revealed GG opacities prob related to radiation therapy; incidental 8mm no58me in RML w/o ch from 2013, atherosclerotic calcif, no adenopathy; due for f/u film today=> improved...    HBP> on MetopER100, Lisin20; BP= 122/68 & she denies CP, palpit, SOB, edema; she wants to decr the MeptopER to '50mg'$ /d- ok & watch BP at home...    Cerebrovasc Dis> on ASA325 & Plavix75;  s/p aneurysm & TIA; stable w/o cerebral ischemic symptoms- she has not had f/u DrDeveshwar,IR- she notes she was told no need for f/u angiography (we might consider MRI later)...    Chol> on Cres20; FLP 3/15 showed TChol 164, TG 116, HDL 49, LDL 92; rec to continue same med & diet efforts...    Hypothyroid> on Synthroid 171mcg/d; labs 3/15 showed TSH= 0.35 & she is clinically euthyroid, continue same...    Colon polyps> last colon was 11/10 w/ one adenomatous polyp removed, f/u planned 80yrs.    Breast cancer> followed by DrMagrinat, DrWakefield, DrMurray w/ prev surg, chemoRx and XRT- notes  reviewed; she has decided against reconstruction...    DJD, FM, Osteop> on Calcium, MVI, OTC analgesics; BMD followed by her GYN...    Anxiety> on EffexorXR75 and she reports stable on this med...  We reviewed prob list, meds, xrays and labs> see below for updates >>   CXR 9/15 showed no infiltrate, opacity, etc; scoliosis & stable compression deformity; port has been removed...  ~  October 13, 2014:  12mo ROV & Debra Mcintyre reports feeling well, no new commplaints or concerns; she notes that her chest is ok- no CP, palpit, SOB, and she denies cough/ sput/ hemoptysis; her weight is 156# w/ BMI=24... We reviewed the following medical problems during today's office visit >>     AbnCXR> CXR 3/15 w/ new RUL airspace dis & CTChest 3/15 revealed GG opacities prob related to radiation therapy; incidental 5mm nodule in RML w/o ch from 2013, atherosclerotic calcif, no adenopathy; f/u film 9/15=> improved.    HBP> on MetopER100, Lisin20; BP= 112/64 & she denies CP, palpit, SOB, edema...    Cerebrovasc Dis> on OAC166 & Plavix75; s/p aneurysm & TIA; stable w/o cerebral ischemic symptoms- she has not had f/u DrDeveshwar,IR- she notes she was told no need for f/u angiography (we might consider MRI later)...    Chol> on Cres20; FLP 3/16 showed TChol 174, TG 200, HDL 45, LDL 89; rec to continue same med & diet efforts...    Hypothyroid> on Synthroid 198mcg/d; labs 3/16 showed TSH= 0.11 & she is clinically euthyroid, rec to step down her Syntroid dose to 13mcg/d...    Colon polyps> last colon was 11/10 w/ one adenomatous polyp removed, f/u planned by DrDBrodie 3/16- pending...    Breast cancer> followed by DrMagrinat Q38mo, DrWakefield, DrMurray w/ prev surg, chemoRx (triple neg tumor) and XRT- notes reviewed; she has decided against reconstruction...    DJD, FM, Osteop> on Calcium, MVI, OTC analgesics; BMD followed by her GYN...    Anxiety> on EffexorXR75 and she reports stable on this med...  We reviewed prob list, meds,  xrays and labs> see below for updates >>   LABS 3/16:  FLP- ok on Cres20 x TG=200;  Chems- wnl;  CBC- wnl;  TSH=0.11 on Synthroid125... PLAN>> [place] is stable & in good spirits; she is asked to follow a low chol/ low fat diet & work on wt reduction; her TSH is oversuppressed on Synthroid125 & we will step her down to 131mcg/d...   ~  April 20, 2015:  70mo ROV & Debra Mcintyre reports doing well, feeling well, no new complaints or concerns;  She continues to f/u w/ DrMagrinat & DrWakefield yearly (alternating Q48mo)- she saw DrMagrinat 8/16 & stable, no evid dis recurrence... We reviewed the following medical problems during today's office visit >>     AbnCXR> CXR 3/15 w/ new RUL airspace dis & CTChest 3/15 revealed GG opacities prob related  to radiation therapy; incidental 69m nodule in RML w/o ch from 2013, atherosclerotic calcif, no adenopathy; f/u film 9/15=> improved, and serial f/u film 9/16=> clear, wnl...    HBP> on MetopER100, Lisin20; BP= 124/76 & she denies CP, palpit, SOB, edema; we discussed increasing her physical activity...    Cerebrovasc Dis> on ASA325 & Plavix75; Hx cerebrovasc dis, tiny aneurysm & TIA; stable w/o cerebral ischemic symptoms- 9/16 she mentioned that she'd like to have f/u MRI/MRA to recheck vasc status and the sm MCA aneurysm...    Chol> on Cres20; FLP 3/16 showed TChol 174, TG 200, HDL 45, LDL 89; rec to continue same med & diet efforts...    Hypothyroid> on Synthroid 1239m/d; labs 3/16 showed TSH= 0.11 & she is clinically euthyroid, rec to step down her Syntroid dose to 11246md...    Colon polyps> she had f/u colonoscopy 6/16 by DrDBrodie- 3mm64mssile polyp removed from the desc colon=> tubular adenoma & they rec f/u colon in 5 yrs...    Breast cancer> Hx right breast cancer Dx 2/14 w/ ChemoRx, Surg, XRT; she continues f/u w/ DrMagrinat & DrWakefield Q6mo;24mot seen 8/16 & stable- no known recurrence, f/u mammogram left breast 8/16 remains neg...     DJD, FM, Osteop> on  Calcium, MVI, OTC analgesics; [facility] Tscore-1.5 in distal left radius; encouraged to continue supplements and wt bearing exercise...    Anxiety> on EffexorXR75 and she reports stable on this med...  EXAM showed Afeb, VSS, O2sat=96%;  HEENT- neg, mallampati2;  Chest- clear w/o w/r/r;  Heart- RR w/o m/r/g;  Abd- soft, neg;  Ext- w/o c/c/e;  Neuro- intact w/o focal neuro deficits...   CXR 9/16 showed norm heart size, clear lungs, +scoliosis, lower thoracic compression deformity is stable, NAD...  EMarland KitchenMarland Kitchen 04/2015 showed NSR, rate65, IRBBB, otherw norm EKG, NAD...  LABS 04/2015:  Chems- wnl;  CBC- wnl  MRI / MRA Brain => pending IMP/PLAN>>  JeannMylynninues to do well- no new complaints or concerns, but she is worried about the fact that she hasn't has a f/u MRI/MRA in 79yrs;27yr continues stable w/o cerebral ischemic symptoms on ASA /Plavix; we will order the scan for follow up;  She needs to increase her exercise program & will consider [employer] at the [employer] & [employer] at the cancer center classes...   ADDENDUM>>  MRI done 05/04/15- for some reason MRA was not done;  No infarct or hemorrhage, no intracranial mass or abn enhancement; mod small vessel dis & mild global atrophy; she needs MRA to assess the 1.5-2mm ri32m MCA bifuraction aneurysm-- I discussed w/ pt & she decided to forego the MRA at this time since she remains asymptomatic...           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 ... ~  Marland Kitchenollowed by DrMagrinat, DrWakefield, DrMurray w/ prev surg, chemoRx and XRT => SUMMARY:    1) status post right breast and right axillary lymph node biopsies February 2014 of  a clinical T2, pN1, stage IIB invasive ductal carcinoma, grade 3, triple negative, with an MIB-1 of 15%.    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    4)status post right mastectomy and sentinel lymph node sampling 04/23/2013 for a residual pT1b pN1 invasive ductal carcinoma, grade 3, again triple negative [2/10 sample lymph nodes were involved by tumor, both with micrometastatic deposits -- NOTE: she opted against reconstruction.    5) adjuvant radiation completed 08/13/2013    6) the patient underwent genetics counseling but declined testing. ~  2015: She reports that 55 y/o sister was Dx w/ breast cancer this yr...  ABNORMAL CXR >>  ~  3/15:  CXR showed new RUL airspace dis & CTChest 3/15 revealed GG opacities prob related to radiation therapy; incidental 55m nodule in RML w/o ch from 2013, atherosclerotic calcif, no adenopathy... ~  9/15:  CXR 9/15 showed no infiltrate, opacity, etc; scoliosis & stable compression deformity; port has been removed... ~  9/16:  CXR 9/16 showed norm heart size, clear lungs, +scoliosis, lower thoracic compression deformity is stable, NAD...  HYPERTENSION (ICD-401.9) > ~  on TOPROL XL '100mg'$ /d,  LISINOPRIL/ Hct 20/12.5 daily; BP well controlled on these meds; denies HA, fatigue, visual changes, CP, palipit, dizziness, syncope, dyspnea, edema, etc; she does water aerobics & walks for exercise...  ~  CXR 8/12 showed normal heart size, clear lungs, scoliosis, NAD... ~  10/12:  Presents w/ chest discomfort> EKG showed NSR, rate 69, rsr' in V1-2, otherw wnl; referred to Cards=> she requests DrKelly. ~  11/13:  BP= 128/82 & she remains asymptomatic... ~  7/14: on MetopER100 & LisinHCT20-12.5; BP= 128/72 but has been volatile at home & in Oncology  office prompting DrMagrinat to change to Lisin10 & stop the Hct in light of her 103 wt loss...  ~  3/15: on MetopER100, Lisin20; BP= 112/64 & she denies CP, palpit, SOB, edema ~  9/15: on MetopER100, Lisin20; BP= 122/68 & she denies CP, palpit, SOB, edema; she wants to decr the MeptopER to '50mg'$ /d => but she never did... ~  3/16: on MetopER100, Lisin20; BP= 112/64 & she denies CP, palpit, SOB, edema ~  9/16: on MetopER100, Lisin20; BP= 124/76 & she remains asymptomatic; we discussed increasing her physical activity...  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 she's been stable since that time w/ out pt f/u by IR, [name]> she indicates he said no further studies needed. ~  CDopplers 5/04 showed mild plaque, no signif ICA stenoses... ~  MR studies 10/09 showed sm vessel dis; poss restenosis left MCA w/ angiogram rec- but wasn't done; mod stenosis at origin of left vertebral art w/ tortuosity... ~  Carotid arteriogram 12/10 showed stable mild residual left middle cerebral art stenosis at site of prev angioplasty, and stable 1.5 to 63m74maccular right middle cerebral art aneurysm... ~  10/12:  she remains asymptomatic- w/o cerebral ischemic symptoms... ~  10/13:  she continues stable on ASA/ Plavix w/o cerebral ischemic symptoms... ~  3/15 & 9/15: on ASA325 & Plavix75; s/p aneurysm & TIA; stable w/o cerebral ischemic symptoms- she has not had f/u DrDeveshwar-IR- she notes she was told no need for f/u angiography (we might consider MRI later) ~  3/16: on ASADaileyhe denies cerebral ischemic symptoms &  is stable... ~  9/16: stable on ASA/ Plavix and we decided to repeat her MRI/ MRA => pending  HYPERCHOLESTEROLEMIA (ICD-272.0) >>  ~  FLP 3/08 shows Tchol 180, TG 206, HDL 49,  LDL 105... rec same meds, better diet, get wt down. ~  [place] 10/09 on Lip80 showed TChol 150, TG 178, HDL 41, LDL 74 ~  FLP 1/11 on Lip80+Zetia10 showed TChol 162, TG 226, HDL 47, LDL 95... she wants to stop Zetia, get on diet & get wt down. ~  FLP 1/12 on Lip80 showed TChol 173, TG 164, HDL 45, LDL 95 ~  FLP 10/13 on Lip80 intermittently showed TChol 189, TG 231, HDL 45, LDL 112... She was c/o paresthesias that she was convinced was due to the Lip80 & she requested change to CRESTOR '20mg'$ /d... ~  [place] 4/14 on Cres20 showed TChol 167, TG 141, HDL 46, LDL 93 ~  FLP 3/15 on Cres20 showed TChol 164, TG 116, HDL 49, LDL 92 ~  FLP 3/16 on Cres20 showed TChol 174, TG 200, HDL 45, LDL 89; we reviewed diet, exercise, wt reduction...  HYPOTHYROIDISM (ICD-244.9) - on SYNTHROID 17mg/d... ~  labs 3/08 showed TSH = 0.23 ~  labs 10/09 showed TSH= 0.64 ~  labs 1/11 showed TSH= 0.18... she wants to keep same dose to aide wt reduction. ~  labs 1/12 showed TSH= 0.20... Ditto ~  Labs 10/13 on Levo125 showed TSH= 1.20 ~  Labs 3/15 on Synthroid125 showed TSH= 0.35 ~  Labs 3/16 on Synthroid125 showed TSH= 0.11; we decided to step down her Synthroid dose to 1116m/d...  COLONIC POLYPS (ICD-211.3), & Hx of HEMORRHOIDS (ICD-455.6) ~  colonoscopy 10/00 by DrDBrodie showed hems only... f/u planned 1080yr~  f/u colonoscopy 11/10 showed 2 polyps- one adenomatous w/ f/u planned 16yr9yr  she had f/u colonoscopy 6/16 by DrDBrodie- 3mm 30msile polyp removed from the desc colon=> tubular adenoma & they rec f/u colon in 5 yrs...  DEGENERATIVE JOINT DISEASE (ICD-715.90) - uses Tylenol & OTC meds as needed... [facility] right [facility] area... [facility]- no surg necessary... ~  1/11:  notes some pain in hands & wrist... ~  1/12:  Stable w/o acute complaints or problem areas... ~  2014: she developed LBP & hip pain while receiving XRT for  her breast cancer; eval by DrMagrinat & DrCaffrey w/ epid steroid shot that really helped... ~  She has a scoliosis and compression deformity in lower TSpine...  Hx of FIBROMYALGIA (ICD-729.1)  OSTEOPOROSIS (ICD-733.00) - she indicates that this is followed & managed by GYN, DrCousins> ?when last BMD was done "she keeps up w/ this" > on Calcium, & Vitamins... ~  labs 1/11 showed Vit D level = 54... ~  Pt again reiterates that GYN follows her BMDs & is up to date... ~  [facility] Tscore-1.5 in distal left radius; encouraged to continue supplements and wt bearing exercise...  TIA (ICD-435.9) - as above, she remains on ASA325 & PLAVIX75... last saw DrReynolds in 2006... 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 arteriogram by Dr. TDeveFleeta Emmerirmed a web-like plaque in the left M1 segment of the left MCA with signif stenosis...  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 subseq PTA of left MCA w/ good result- resid 20% stenosis  seen on f/u angiograms along w/ a 65m saccular aneurysm seen in the right MCA trifurcation...  ANXIETY (ICD-300.00) - on EFFEXOR '75mg'$ /d for hot flashes, she says.... she wishes to continue the med "it keeps me on an even keel".  Health Maintenance - GYN= DrCousins & she will call for f/u... [facility] DrCousins... ~  Immunizations: she refuses Flu vaccine... given PNEUMOVAX- 1/11, and Tdap- 1/11...   Past Surgical History  Procedure Laterality Date  . Left middle cerebral artery angioplasty  2004    by [name] had 2 follow up occurances  . Portacath placement N/A 11/05/2012    Procedure: INSERTION PORT-A-CATH;  Surgeon: MRolm Bookbinder MD;  [place] [facility]  Service: General;  Laterality: N/A;  . Breast biopsy Right 10/23/2012  . Breast biopsy Right 10/08/2012  .  Mastectomy w/ sentinel node biopsy Right 04/23/2013    Procedure: RIGHT TOTAL MASTECTOMY WITH SENTINEL LYMPH NODE BIOPSY;  Surgeon: MRolm Bookbinder MD;  [place] [facility]  Service: General;  Laterality: Right;    Outpatient Encounter Prescriptions as of 04/20/2015  Medication Sig  . aspirin 325 MG tablet Take 325 mg by mouth daily.    . Calcium Carbonate-Vitamin D (CALCIUM + D PO) Take 1 tablet by mouth 2 (two) times daily.  . clopidogrel (PLAVIX) 75 MG tablet TAKE ONE TABLET BY MOUTH ONCE DAILY.  .Marland KitchenCRESTOR 20 MG tablet TAKE ONE TABLET BY MOUTH ONCE DAILY  . gabapentin (NEURONTIN) 300 MG capsule Take 1 capsule (300 mg total) by mouth at bedtime.  .Marland Kitchenlevothyroxine (SYNTHROID, LEVOTHROID) 112 MCG tablet Take 1 tablet (112 mcg total) by mouth daily before breakfast.  . lisinopril (PRINIVIL,ZESTRIL) 20 MG tablet TAKE ONE TABLET BY MOUTH ONCE DAILY  . metoprolol succinate (TOPROL-XL) 100 MG 24 hr tablet TAKE ONE TABLET BY MOUTH ONCE DAILY  . metroNIDAZOLE (METROCREAM) 0.75 % cream Apply 1 application topically 2 (two) times daily. As directed  . Multiple Vitamin (MULTIVITAMIN) capsule Take 1 capsule by mouth daily.    .Marland Kitchenvenlafaxine XR (EFFEXOR-XR) 75 MG 24 hr capsule TAKE ONE CAPSULE BY MOUTH ONCE DAILY   No facility-administered encounter medications on file as of 04/20/2015.    No Known Allergies   Current Medications, Allergies, Past Medical History, Past Surgical History, Family History, and Social History were reviewed in CRel[employer] Energyrecord.    Review of Systems    Constitutional:  Denies F/C/S, appetite is good, weight is stable ~150# HEENT:  No HA, visual changes, earache, nasal symptoms, sore throat, hoarseness. Resp:  No cough, sputum, hemoptysis; +SOB/DOE w/o tightness or wheezing. Cardio:  No CP, palpit, orthopnea, edema; notes some DOE GI:  Denies N/V/D/C or blood in stool; no reflux, abd pain, distention, or gas. GU:  No dysuria, freq, urgency, hematuria,  or flank pain. MS:  Denies joint pain, swelling, tenderness; no neck pain, back pain, etc. Neuro:  No tremors, seizures, dizziness, syncope; prev weakness & numbness improved, gait is ok... Skin:  No suspicious lesions or skin rash. Heme:  No bruising, bleeding, etc; axillary adenopathy is resolved Psyche: Denies confusion, sleep disturbance, hallucinations, anxiety, depression.   Objective:   Physical Exam      WD, WN, 74y/o WF in NAD... GENERAL:  Alert & oriented; pleasant & cooperative... HEENT:  [place]/AT, EOM-wnl, PERRLA, EACs-clear, TMs-wnl, NOSE-clear, THROAT-clear & wnl. NECK:  Supple w/ fairROM; no JVD; normal carotid impulses w/o bruits; no thyromegaly or nodules palpated; no lymphadenopathy.  CHEST:  Clear to P&A w/o wheezing, rales, or rhonchi detected... HEART:  Regular Rhythm; without murmurs/ rubs/ or gallops. ABDOMEN:  Soft & nontender; normal bowel sounds; no organomegaly or masses palpated. EXT: without deformities, mild arthritic changes; no varicose veins/ +venous insuffic/ no edema. NEURO:  CN's intact;  no focal neuro deficits... DERM:  No lesions noted; no rash etc...  RADIOLOGY DATA:  Reviewed in the EPIC EMR & discussed w/ the patient...  LABORATORY DATA:  Reviewed in the EPIC EMR & discussed w/ the patient...   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 She continues f/u w/ DrMagrinat & DrWakefield yearly...  ASTHMATIC BRONCHITIS>  No recent URIs or resp exac & she is once again not on regular meds, denies breathing problems, etc... Abn CXR>  Faint GG opacity in RUL has resolved (likely related to XRT); she is at baseline, stable...  HBP>  Controlled on BBlocker, & ACE, she continues to watch BP at home...  Cerebrovasc Dis>  She had TIA 2004 w/ eval revealing left MCA stenosis; DrDeveshwar did PTA w/ good result;  incidental 1-21m aneurysm noted in the right MCA; she has remained asymptomatic since 2004 on the ASA/ Plavix. We will f/u MRI / MRA 9/16 => pending  CHOL>  Controlled on Cres20 (she was intol to Lip80), needs better low fat diet...  HPYOTHYROID>  On Synthroid 1259m/d w/ TSH = 0/11 so we will step down her dose to 11273md...  Other medical problems as noted...   Patient's Medications  New Prescriptions   No medications on file  Previous Medications   ASPIRIN 325 MG TABLET    Take 325 mg by mouth daily.     CALCIUM CARBONATE-VITAMIN D (CALCIUM + D PO)    Take 1 tablet by mouth 2 (two) times daily.   CLOPIDOGREL (PLAVIX) 75 MG TABLET    TAKE ONE TABLET BY MOUTH ONCE DAILY.   CRESTOR 20 MG TABLET    TAKE ONE TABLET BY MOUTH ONCE DAILY   GABAPENTIN (NEURONTIN) 300 MG CAPSULE    Take 1 capsule (300 mg total) by mouth at bedtime.   LEVOTHYROXINE (SYNTHROID, LEVOTHROID) 112 MCG TABLET    Take 1 tablet (112 mcg total) by mouth daily before breakfast.   LISINOPRIL (PRINIVIL,ZESTRIL) 20 MG TABLET    TAKE ONE TABLET BY MOUTH ONCE DAILY   METOPROLOL SUCCINATE (TOPROL-XL) 100 MG 24 HR TABLET    TAKE ONE TABLET BY MOUTH ONCE DAILY   METRONIDAZOLE (METROCREAM) 0.75 % CREAM    Apply 1 application topically 2 (two) times daily. As directed   MULTIPLE VITAMIN (MULTIVITAMIN) CAPSULE    Take 1 capsule by mouth daily.     VENLAFAXINE XR (EFFEXOR-XR) 75 MG 24 HR CAPSULE    TAKE ONE CAPSULE BY MOUTH ONCE DAILY  Modified Medications   No medications on file  Discontinued Medications   No medications on file

## 2015-04-20 NOTE — Patient Instructions (Signed)
Today we updated your med list in our EPIC system...    Continue your current medications the same...  Today we did a follow up CXR... We discussed following up on your MRI/ MRA as well- we will set this up...    Marland KitchenWe will contact you w/ the results when available...   We gave you the 2016 flu vaccine today...  Call for any questions...  Let's plan a follow up visit in 53mo sooner if needed for problems..Marland KitchenMarland Kitchen

## 2015-05-03 ENCOUNTER — Encounter (HOSPITAL_COMMUNITY): Payer: Self-pay

## 2015-05-03 ENCOUNTER — Encounter (HOSPITAL_COMMUNITY)
Admission: RE | Admit: 2015-05-03 | Discharge: 2015-05-03 | Disposition: A | Discharge status: Home / Self Care | Payer: Medicare Other | Source: Ambulatory Visit | Attending: Pulmonary Disease | Admitting: Pulmonary Disease

## 2015-05-03 ENCOUNTER — Telehealth: Payer: Self-pay | Admitting: Pulmonary Disease

## 2015-05-03 DIAGNOSIS — Z7902 Long term (current) use of antithrombotics/antiplatelets: Secondary | ICD-10-CM | POA: Diagnosis not present

## 2015-05-03 DIAGNOSIS — I671 Cerebral aneurysm, nonruptured: Secondary | ICD-10-CM | POA: Diagnosis not present

## 2015-05-03 DIAGNOSIS — J45909 Unspecified asthma, uncomplicated: Secondary | ICD-10-CM | POA: Diagnosis not present

## 2015-05-03 DIAGNOSIS — E785 Hyperlipidemia, unspecified: Secondary | ICD-10-CM | POA: Diagnosis not present

## 2015-05-03 DIAGNOSIS — I10 Essential (primary) hypertension: Secondary | ICD-10-CM | POA: Diagnosis not present

## 2015-05-03 DIAGNOSIS — Z87891 Personal history of nicotine dependence: Secondary | ICD-10-CM | POA: Diagnosis not present

## 2015-05-03 DIAGNOSIS — K219 Gastro-esophageal reflux disease without esophagitis: Secondary | ICD-10-CM | POA: Diagnosis not present

## 2015-05-03 DIAGNOSIS — Z853 Personal history of malignant neoplasm of breast: Secondary | ICD-10-CM | POA: Diagnosis not present

## 2015-05-03 DIAGNOSIS — Z9011 Acquired absence of right breast and nipple: Secondary | ICD-10-CM | POA: Diagnosis not present

## 2015-05-03 DIAGNOSIS — E039 Hypothyroidism, unspecified: Secondary | ICD-10-CM | POA: Diagnosis not present

## 2015-05-03 DIAGNOSIS — M1990 Unspecified osteoarthritis, unspecified site: Secondary | ICD-10-CM | POA: Diagnosis not present

## 2015-05-03 DIAGNOSIS — Z923 Personal history of irradiation: Secondary | ICD-10-CM | POA: Diagnosis not present

## 2015-05-03 DIAGNOSIS — E780 Pure hypercholesterolemia: Secondary | ICD-10-CM | POA: Diagnosis not present

## 2015-05-03 HISTORY — DX: Headache: R51

## 2015-05-03 HISTORY — DX: Dizziness and giddiness: R42

## 2015-05-03 HISTORY — DX: Presence of spectacles and contact lenses: Z97.3

## 2015-05-03 HISTORY — DX: Other seasonal allergic rhinitis: J30.2

## 2015-05-03 HISTORY — DX: Rosacea, unspecified: L71.9

## 2015-05-03 HISTORY — DX: Cardiac murmur, unspecified: R01.1

## 2015-05-03 HISTORY — DX: Headache, unspecified: R51.9

## 2015-05-03 LAB — BASIC METABOLIC PANEL
ANION GAP: 8 (ref 5–15)
BUN: 13 mg/dL (ref 6–20)
CALCIUM: 9.5 mg/dL (ref 8.9–10.3)
CHLORIDE: 103 mmol/L (ref 101–111)
CO2: 27 mmol/L (ref 22–32)
Creatinine, Ser: 0.64 mg/dL (ref 0.44–1.00)
GFR calc non Af Amer: >60 mL/min (ref >60)
Glucose, Bld: 82 mg/dL (ref 65–99)
Potassium: 4.6 mmol/L (ref 3.5–5.1)
Sodium: 138 mmol/L (ref 135–145)

## 2015-05-03 LAB — CBC
HCT: 39.1 % (ref 36.0–46.0)
HEMOGLOBIN: 13.1 g/dL (ref 12.0–15.0)
MCH: 30.1 pg (ref 26.0–34.0)
MCHC: 33.5 g/dL (ref 30.0–36.0)
MCV: 89.9 fL (ref 78.0–100.0)
Platelets: 211 10*3/uL (ref 150–400)
RBC: 4.35 MIL/uL (ref 3.87–5.11)
RDW: 13.2 % (ref 11.5–15.5)
WBC: 6.1 10*3/uL (ref 4.0–10.5)

## 2015-05-03 NOTE — Progress Notes (Signed)
Anesthesia Chart Review: Patient is a 74 year old female scheduled for MRI brain tomorrow under anesthesia. MRI was ordered by Dr. Leeanne Deed who saw patient on 04/20/15 (see Epic). 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 She wanted to have a f/u MRI/MRA to recheck the status of her MCA aneurysm.  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  Meds include ASA, Plavix, Crestor, Neurontin, levothyroxine, lisinopril, Claritin, Toprol, Effexor XR.   05/03/15 EKG: NSR, incomplete right BBB. Overall I think the EKG appears stable.   11/06/12 Echo:  Study Conclusions - Left ventricle: The cavity size was normal. Wall thickness was normal. The estimated ejection fraction was 60%. Wall motion was normal; there were no regional wall motion abnormalities. Left ventricular diastolic function parameters were normal. Average global strain is -16%. - Impressions: This study is technically limited, but the data is adequate. Impressions: - This study is technically limited, but the data is adequate.  04/20/15 CXR: IMPRESSION: No active cardiopulmonary disease.  Today's labs noted.   If no acute changes then I anticipate that she can proceed as planned.   AGeorge HughMSalem Va [facility] CenterShort [facility]/Anesthesiology Phone ([phone]/19/2016 4:43 PM

## 2015-05-03 NOTE — Telephone Encounter (Signed)
Pre-Admission at hospital called. They want [place] to call pt to advise please. Pt is schd for a MRI tomorrow. Should pt take Plavix and asprin tonight or tomorrow?  [phone]

## 2015-05-03 NOTE — Progress Notes (Signed)
PCP - Dr. Teressa Lower Cardiologist - denies  EKG - 05/03/15 - Epic CXR - 04/20/15 - Epic  Echo - 10/2012 - Epic Stress Text/Cardiac Cath - denies   Patient denies shortness of breath and chest pain at PAT appointment.

## 2015-05-03 NOTE — Telephone Encounter (Signed)
Spoke with SN. Pt does not have to stop Plavix or ASA prior to MRI.  Spoke with pt's husband, [name]. He is aware and will make sure this message is relayed to her. Nothing further was needed.

## 2015-05-03 NOTE — Progress Notes (Signed)
Patient takes Plavix and Aspirin 325 mg and was not given informed if both should be held or if she should take prior to the procedure.  Spoke with nurse at Dr. Jeannine Kitten office who stated that she would call the patient back this afternoon with instructions.  Patient also stated that she would call Dr. Jeannine Kitten office if she has not heard from them by 5:00.

## 2015-05-03 NOTE — Pre-Procedure Instructions (Signed)
    Debra Mcintyre  05/03/2015      [facility] [place], [place] - [address] [place] [address] Phone: [phone] Fax: [phone]    Your procedure is scheduled on Tuesday, September 20th, 2016  Report to [facility] Admitting at 8:00 A.M.  Call this number if you have problems the morning of surgery:  [phone]   Remember:  Do not eat food or drink liquids after midnight.   Take these medicines the morning of surgery with A SIP OF WATER: Levothyroxine (Synthroid),  Venlafaxine XR (Effexor-XR), Gabapentin (Neurontin) if needed, Loratadine (Claritin) if needed, eye drops if needed.   Stop taking: Plavix, Aspirin, NSAIDS, Aleve, Naproxen, Ibuprofen, BC's, Goody's, Advil, Motrin, Fish Oil, all herbal medications, and all vitamins.    Do not wear jewelry, make-up or nail polish.  Do not wear lotions, powders, or perfumes.  You may wear deodorant.  Do not shave 48 hours prior to surgery.    Do not bring valuables to the hospital.  [facility] is not responsible for any belongings or valuables.  Contacts, dentures or bridgework may not be worn into surgery.  Leave your suitcase in the car.  After surgery it may be brought to your room.  For patients admitted to the hospital, discharge time will be determined by your treatment team.  Patients discharged the day of surgery will not be allowed to drive home.   Special instructions:  See attached.   Please read over the following fact sheets that you were given.

## 2015-05-04 ENCOUNTER — Ambulatory Visit (HOSPITAL_COMMUNITY)
Admission: RE | Admit: 2015-05-04 | Discharge: 2015-05-04 | Disposition: A | Discharge status: Home / Self Care | Payer: Medicare Other | Source: Ambulatory Visit | Attending: Pulmonary Disease | Admitting: Pulmonary Disease

## 2015-05-04 ENCOUNTER — Encounter (HOSPITAL_COMMUNITY)
Admission: RE | Disposition: A | Discharge status: Home / Self Care | Payer: Self-pay | Source: Ambulatory Visit | Attending: Pulmonary Disease

## 2015-05-04 ENCOUNTER — Ambulatory Visit (HOSPITAL_COMMUNITY): Payer: Medicare Other | Admitting: Vascular Surgery

## 2015-05-04 ENCOUNTER — Encounter (HOSPITAL_COMMUNITY): Payer: Self-pay | Admitting: *Deleted

## 2015-05-04 ENCOUNTER — Ambulatory Visit (HOSPITAL_COMMUNITY): Payer: Medicare Other | Admitting: Anesthesiology

## 2015-05-04 DIAGNOSIS — K219 Gastro-esophageal reflux disease without esophagitis: Secondary | ICD-10-CM | POA: Insufficient documentation

## 2015-05-04 DIAGNOSIS — I10 Essential (primary) hypertension: Secondary | ICD-10-CM | POA: Insufficient documentation

## 2015-05-04 DIAGNOSIS — Z87891 Personal history of nicotine dependence: Secondary | ICD-10-CM | POA: Diagnosis not present

## 2015-05-04 DIAGNOSIS — E039 Hypothyroidism, unspecified: Secondary | ICD-10-CM | POA: Insufficient documentation

## 2015-05-04 DIAGNOSIS — E785 Hyperlipidemia, unspecified: Secondary | ICD-10-CM | POA: Diagnosis not present

## 2015-05-04 DIAGNOSIS — Z7902 Long term (current) use of antithrombotics/antiplatelets: Secondary | ICD-10-CM | POA: Insufficient documentation

## 2015-05-04 DIAGNOSIS — I679 Cerebrovascular disease, unspecified: Secondary | ICD-10-CM | POA: Diagnosis not present

## 2015-05-04 DIAGNOSIS — Z853 Personal history of malignant neoplasm of breast: Secondary | ICD-10-CM | POA: Insufficient documentation

## 2015-05-04 DIAGNOSIS — Z923 Personal history of irradiation: Secondary | ICD-10-CM | POA: Diagnosis not present

## 2015-05-04 DIAGNOSIS — I671 Cerebral aneurysm, nonruptured: Secondary | ICD-10-CM | POA: Diagnosis not present

## 2015-05-04 DIAGNOSIS — J45909 Unspecified asthma, uncomplicated: Secondary | ICD-10-CM | POA: Insufficient documentation

## 2015-05-04 DIAGNOSIS — M1990 Unspecified osteoarthritis, unspecified site: Secondary | ICD-10-CM | POA: Insufficient documentation

## 2015-05-04 DIAGNOSIS — Z9011 Acquired absence of right breast and nipple: Secondary | ICD-10-CM | POA: Insufficient documentation

## 2015-05-04 DIAGNOSIS — E780 Pure hypercholesterolemia: Secondary | ICD-10-CM | POA: Insufficient documentation

## 2015-05-04 HISTORY — PX: RADIOLOGY WITH ANESTHESIA: SHX6223

## 2015-05-04 SURGERY — RADIOLOGY WITH ANESTHESIA
Anesthesia: General

## 2015-05-04 MED ORDER — GADOBENATE DIMEGLUMINE 529 MG/ML IV SOLN
15.0000 mL | Freq: Once | INTRAVENOUS | Status: AC | PRN
  Administered 2015-05-04: 15 mL via INTRAVENOUS

## 2015-05-04 MED ORDER — LACTATED RINGERS IV SOLN
INTRAVENOUS | Status: DC
  Administered 2015-05-04: 50 mL/h via INTRAVENOUS

## 2015-05-04 NOTE — Anesthesia Postprocedure Evaluation (Signed)
  Anesthesia Post-op Note  Patient: Debra Mcintyre  Procedure(s) Performed: Procedure(s) (LRB): MRI BRAIN WITH AND WITHOUT (N/A)  Patient Location: PACU  Anesthesia Type: General  Level of Consciousness: awake and alert   Airway and Oxygen Therapy: Patient Spontanous Breathing  Post-op Pain: mild  Post-op Assessment: Post-op Vital signs reviewed, Patient's Cardiovascular Status Stable, Respiratory Function Stable, Patent Airway and No signs of Nausea or vomiting  Last Vitals:  Filed Vitals:   05/04/15 1215  BP: 144/73  Pulse: 72  Temp:   Resp: 11    Post-op Vital Signs: stable   Complications: No apparent anesthesia complications

## 2015-05-04 NOTE — Transfer of Care (Signed)
Immediate Anesthesia Transfer of Care Note  Patient: Debra Mcintyre  Procedure(s) Performed: Procedure(s): MRI BRAIN WITH AND WITHOUT (N/A)  Patient Location: PACU  Anesthesia Type:General  Level of Consciousness: awake, alert  and oriented  Airway & Oxygen Therapy: Patient Spontanous Breathing and Patient connected to nasal cannula oxygen  Post-op Assessment: Report given to RN, Post -op Vital signs reviewed and stable and Patient moving all extremities  Post vital signs: Reviewed and stable  Last Vitals:  Filed Vitals:   05/04/15 1157  BP: 129/78  Pulse: 76  Temp: 36.7 C  Resp: 18    Complications: No apparent anesthesia complications

## 2015-05-04 NOTE — Anesthesia Preprocedure Evaluation (Addendum)
Anesthesia Evaluation  Patient identified by MRN, date of birth, ID band Patient awake    Reviewed: Allergy & Precautions, NPO status , Patient's Chart, lab work & pertinent test results, reviewed documented beta blocker date and time   Airway Mallampati: II  TM Distance: >3 FB Neck ROM: Full    Dental no notable dental hx.    Pulmonary neg pulmonary ROS, asthma , former smoker,    Pulmonary exam normal breath sounds clear to auscultation       Cardiovascular hypertension, Pt. on medications and Pt. on home beta blockers + Peripheral Vascular Disease  Normal cardiovascular exam Rhythm:Regular Rate:Normal     Neuro/Psych TIAnegative psych ROS   GI/Hepatic negative GI ROS, Neg liver ROS, GERD  ,  Endo/Other  negative endocrine ROSHypothyroidism   Renal/GU negative Renal ROS  negative genitourinary   Musculoskeletal negative musculoskeletal ROS (+) Arthritis ,   Abdominal   Peds negative pediatric ROS (+)  Hematology negative hematology ROS (+)   Anesthesia Other Findings   Reproductive/Obstetrics negative OB ROS                            Anesthesia Physical Anesthesia Plan  ASA: II  Anesthesia Plan: General   Post-op Pain Management:    Induction: Intravenous  Airway Management Planned: LMA  Additional Equipment:   Intra-op Plan:   Post-operative Plan: Extubation in OR  Informed Consent: I have reviewed the patients History and Physical, chart, labs and discussed the procedure including the risks, benefits and alternatives for the proposed anesthesia with the patient or authorized representative who has indicated his/her understanding and acceptance.   Dental advisory given  Plan Discussed with: CRNA and Surgeon  Anesthesia Plan Comments:        Anesthesia Quick Evaluation

## 2015-05-05 ENCOUNTER — Other Ambulatory Visit: Payer: Self-pay | Admitting: Pulmonary Disease

## 2015-05-05 ENCOUNTER — Encounter (HOSPITAL_COMMUNITY): Payer: Self-pay | Admitting: Radiology

## 2015-05-07 MED FILL — Phenylephrine-NaCl Pref Syr 0.4 MG/10ML-0.9% (40 MCG/ML): INTRAVENOUS | Qty: 10 | Status: AC

## 2015-05-07 MED FILL — Ephedrine Sulfate Inj 50 MG/ML: INTRAMUSCULAR | Qty: 1 | Status: AC

## 2015-05-07 MED FILL — Ondansetron HCl Inj 4 MG/2ML (2 MG/ML): INTRAMUSCULAR | Qty: 2 | Status: AC

## 2015-05-07 MED FILL — Lidocaine HCl IV Inj 20 MG/ML: INTRAVENOUS | Qty: 5 | Status: AC

## 2015-05-07 MED FILL — Propofol IV Emul 200 MG/20ML (10 MG/ML): INTRAVENOUS | Qty: 20 | Status: AC

## 2015-05-10 ENCOUNTER — Telehealth: Payer: Self-pay | Admitting: Pulmonary Disease

## 2015-05-11 NOTE — Telephone Encounter (Signed)
Result Notes     Notes Recorded by [name], MD on 05/11/2015 at 9:46 AM I called pt w/ report> unfortunately only the MRI was done, not the MRI/MRA as requested> the MRI showed no infarct or hemorrhage, no mass lesion or abn enhancement, mod sm vessel dis & mild atrophy/ NAD... I discussed w/ pt & suggested proceeding w/ the MRA but she wanted to hold off for now as she remains asymptomatic on ASA/Plavix... SMN   -------  Spoke with pt.  Confirmed she has spoke with SN.  Nothing further needed.

## 2015-05-12 DIAGNOSIS — L821 Other seborrheic keratosis: Secondary | ICD-10-CM | POA: Diagnosis not present

## 2015-05-12 DIAGNOSIS — Z872 Personal history of diseases of the skin and subcutaneous tissue: Secondary | ICD-10-CM | POA: Diagnosis not present

## 2015-05-12 DIAGNOSIS — Z1283 Encounter for screening for malignant neoplasm of skin: Secondary | ICD-10-CM | POA: Diagnosis not present

## 2015-05-12 DIAGNOSIS — D485 Neoplasm of uncertain behavior of skin: Secondary | ICD-10-CM | POA: Diagnosis not present

## 2015-05-12 DIAGNOSIS — L578 Other skin changes due to chronic exposure to nonionizing radiation: Secondary | ICD-10-CM | POA: Diagnosis not present

## 2015-05-12 DIAGNOSIS — L718 Other rosacea: Secondary | ICD-10-CM | POA: Diagnosis not present

## 2015-05-22 ENCOUNTER — Other Ambulatory Visit: Payer: Self-pay | Admitting: Pulmonary Disease

## 2015-06-05 ENCOUNTER — Other Ambulatory Visit: Payer: Self-pay | Admitting: Pulmonary Disease

## 2015-06-25 ENCOUNTER — Other Ambulatory Visit: Payer: Self-pay | Admitting: General Surgery

## 2015-06-25 DIAGNOSIS — C50911 Malignant neoplasm of unspecified site of right female breast: Secondary | ICD-10-CM | POA: Diagnosis not present

## 2015-06-25 DIAGNOSIS — C50411 Malignant neoplasm of upper-outer quadrant of right female breast: Secondary | ICD-10-CM

## 2015-07-05 ENCOUNTER — Other Ambulatory Visit: Payer: Self-pay | Admitting: General Surgery

## 2015-07-05 ENCOUNTER — Ambulatory Visit
Admission: RE | Admit: 2015-07-05 | Discharge: 2015-07-05 | Disposition: A | Discharge status: Home / Self Care | Payer: Medicare Other | Source: Ambulatory Visit | Attending: General Surgery | Admitting: General Surgery

## 2015-07-05 DIAGNOSIS — C50411 Malignant neoplasm of upper-outer quadrant of right female breast: Secondary | ICD-10-CM

## 2015-07-05 DIAGNOSIS — N63 Unspecified lump in breast: Secondary | ICD-10-CM | POA: Diagnosis not present

## 2015-07-12 ENCOUNTER — Ambulatory Visit
Admission: RE | Admit: 2015-07-12 | Discharge: 2015-07-12 | Disposition: A | Discharge status: Home / Self Care | Payer: Medicare Other | Source: Ambulatory Visit | Attending: General Surgery | Admitting: General Surgery

## 2015-07-12 ENCOUNTER — Other Ambulatory Visit: Payer: Self-pay | Admitting: General Surgery

## 2015-07-12 DIAGNOSIS — N61 Mastitis without abscess: Secondary | ICD-10-CM | POA: Diagnosis not present

## 2015-07-12 DIAGNOSIS — C50411 Malignant neoplasm of upper-outer quadrant of right female breast: Secondary | ICD-10-CM

## 2015-07-12 DIAGNOSIS — N63 Unspecified lump in breast: Secondary | ICD-10-CM | POA: Diagnosis not present

## 2015-07-23 ENCOUNTER — Other Ambulatory Visit: Payer: Self-pay | Admitting: Pulmonary Disease

## 2015-08-24 ENCOUNTER — Other Ambulatory Visit: Payer: Self-pay | Admitting: Pulmonary Disease

## 2015-08-31 ENCOUNTER — Other Ambulatory Visit: Payer: Self-pay | Admitting: Pulmonary Disease

## 2015-09-13 DIAGNOSIS — H2513 Age-related nuclear cataract, bilateral: Secondary | ICD-10-CM | POA: Diagnosis not present

## 2015-09-21 ENCOUNTER — Other Ambulatory Visit: Payer: Self-pay | Admitting: Pulmonary Disease

## 2015-09-22 DIAGNOSIS — H2512 Age-related nuclear cataract, left eye: Secondary | ICD-10-CM | POA: Diagnosis not present

## 2015-09-29 ENCOUNTER — Other Ambulatory Visit: Payer: Self-pay | Admitting: Pulmonary Disease

## 2015-10-06 DIAGNOSIS — H2511 Age-related nuclear cataract, right eye: Secondary | ICD-10-CM | POA: Diagnosis not present

## 2015-10-06 DIAGNOSIS — H2512 Age-related nuclear cataract, left eye: Secondary | ICD-10-CM | POA: Diagnosis not present

## 2015-10-13 ENCOUNTER — Other Ambulatory Visit: Payer: Self-pay | Admitting: Pulmonary Disease

## 2015-10-18 ENCOUNTER — Ambulatory Visit (INDEPENDENT_AMBULATORY_CARE_PROVIDER_SITE_OTHER): Payer: Medicare Other | Admitting: Pulmonary Disease

## 2015-10-18 ENCOUNTER — Encounter: Payer: Self-pay | Admitting: Pulmonary Disease

## 2015-10-18 VITALS — BP 124/70 | HR 68 | Temp 98.1°F | Ht 68.0 in | Wt 163.0 lb

## 2015-10-18 DIAGNOSIS — J4520 Mild intermittent asthma, uncomplicated: Secondary | ICD-10-CM

## 2015-10-18 DIAGNOSIS — E7800 Pure hypercholesterolemia, unspecified: Secondary | ICD-10-CM

## 2015-10-18 DIAGNOSIS — D126 Benign neoplasm of colon, unspecified: Secondary | ICD-10-CM

## 2015-10-18 DIAGNOSIS — E039 Hypothyroidism, unspecified: Secondary | ICD-10-CM

## 2015-10-18 DIAGNOSIS — M150 Primary generalized (osteo)arthritis: Secondary | ICD-10-CM

## 2015-10-18 DIAGNOSIS — M810 Age-related osteoporosis without current pathological fracture: Secondary | ICD-10-CM

## 2015-10-18 DIAGNOSIS — K219 Gastro-esophageal reflux disease without esophagitis: Secondary | ICD-10-CM

## 2015-10-18 DIAGNOSIS — I10 Essential (primary) hypertension: Secondary | ICD-10-CM

## 2015-10-18 DIAGNOSIS — M159 Polyosteoarthritis, unspecified: Secondary | ICD-10-CM

## 2015-10-18 DIAGNOSIS — C50411 Malignant neoplasm of upper-outer quadrant of right female breast: Secondary | ICD-10-CM

## 2015-10-18 DIAGNOSIS — I679 Cerebrovascular disease, unspecified: Secondary | ICD-10-CM

## 2015-10-18 NOTE — Progress Notes (Signed)
Subjective:    Patient ID: Debra Mcintyre, female    DOB: [birth year], 75 y.o.   MRN: [id number]  HPI 75 y/o WF here for a follow up visit... she has multiple medical problems as noted below...  ~  SEE PREV EPIC NOTES FOR OLDER DATA>>   ~  March 2. 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 She developed back & hip pain while getting her XRT- eval by DrCaffrey w/ ?HNP, no sign of mets, given epid steroid shot which really helped...  She has some paresthesias & tried Neurontin, stopped this on her own...     HBP> on MetopER100, Lisin20; BP= 112/64 & she denies CP, palpit, SOB, edema...    Cerebrovasc Dis> on ASIEP329 Plavix75; s/p aneurysm & TIA; stable w/o cerebral ischemic symptoms- she has not had f/u DrDeveshwar-IR- she notes she was told no need for f/u angiography (we might consider MRI later)...    Chol> on Cres20; FLP showed TChol 164, TG 116, HDL 49, LDL 92; rec to continue same med & diet efforts...    Hypothyroid> on Synthroid 1259md; TSH= 0.35 & she is clinically euthyroid, continue same...    Colon polyps> last colon was 11/10 w/ one adenomatous polyp removed, f/u planned 4yr34yr  Breast cancer> followed by DrMagrinat, DrWakefield, DrMurray w/ prev surg, chemoRx and XRT- notes reviewed...    DJD, FM, Osteop> on Calcium, MVI, OTC analgesics; BMD followed by her GYN...    Anxiety> on EffexorXR75 and she reports stable on this med...  We reviewed prob list, meds, xrays and labs> see below for updates >>   CXR 3/15 showed borderline heart size, left sided port, new RUL airsp dis (could be fibrosis from XRT), scoliosis & lower Tspine compression...  CT Chest 3/15 showed subpleural airspace  consolidation and ground-glass in the anterior aspect of the right upper and right middle lobes indicative of radiation therapy; post op changes in right breast & axila; incidental 8mm 49mule in RMLunchanged from 2013, no adenopathy, atherosclerotic calcif noted...   LABS 3/15:  FLP- at goals on Cres20;  Chems- wnl;  CBC- wnl w/ Hg=12.6;  TSH=0.35 on Synthroid125...   ~  April 16, 2014:  28mo R58mo Debra Mcintyre reports a good interval- she informs me that she has decided against reconstruction & is very comfortable w/ her decision;  She notes some fall allergies & is using Zyrtek prn;  She gets exercise by walking the dog & will join a Y program soon;  She has had f/u visits w/ DrSanger, DrWakefield, & DrMagrinat in the interval- stable & doing satis, f/u left mammogram 6/15 (she is s/p right mastectomy) and it was neg... We reviewed the following medical problems during today's office visit >>     AbnCXR> CXR 3/15 showed new RUL airspace dis & CTChest 3/15 revealed GG opacities prob related to radiation therapy; incidental 8mm no16me in RML w/o ch from 2013, atherosclerotic calcif, no adenopathy; due for f/u film today=> improved...    HBP> on MetopER100, Lisin20; BP= 122/68 & she denies CP, palpit, SOB, edema; she wants to decr the MeptopER to '50mg'$ /d- ok & watch BP at home...    Cerebrovasc Dis> on ASA325 & Plavix75;  s/p aneurysm & TIA; stable w/o cerebral ischemic symptoms- she has not had f/u DrDeveshwar,IR- she notes she was told no need for f/u angiography (we might consider MRI later)...    Chol> on Cres20; FLP 3/15 showed TChol 164, TG 116, HDL 49, LDL 92; rec to continue same med & diet efforts...    Hypothyroid> on Synthroid 171mcg/d; labs 3/15 showed TSH= 0.35 & she is clinically euthyroid, continue same...    Colon polyps> last colon was 11/10 w/ one adenomatous polyp removed, f/u planned 80yrs.    Breast cancer> followed by DrMagrinat, DrWakefield, DrMurray w/ prev surg, chemoRx and XRT- notes  reviewed; she has decided against reconstruction...    DJD, FM, Osteop> on Calcium, MVI, OTC analgesics; BMD followed by her GYN...    Anxiety> on EffexorXR75 and she reports stable on this med...  We reviewed prob list, meds, xrays and labs> see below for updates >>   CXR 9/15 showed no infiltrate, opacity, etc; scoliosis & stable compression deformity; port has been removed...  ~  October 13, 2014:  12mo ROV & Debra Mcintyre reports feeling well, no new commplaints or concerns; she notes that her chest is ok- no CP, palpit, SOB, and she denies cough/ sput/ hemoptysis; her weight is 156# w/ BMI=24... We reviewed the following medical problems during today's office visit >>     AbnCXR> CXR 3/15 w/ new RUL airspace dis & CTChest 3/15 revealed GG opacities prob related to radiation therapy; incidental 5mm nodule in RML w/o ch from 2013, atherosclerotic calcif, no adenopathy; f/u film 9/15=> improved.    HBP> on MetopER100, Lisin20; BP= 112/64 & she denies CP, palpit, SOB, edema...    Cerebrovasc Dis> on OAC166 & Plavix75; s/p aneurysm & TIA; stable w/o cerebral ischemic symptoms- she has not had f/u DrDeveshwar,IR- she notes she was told no need for f/u angiography (we might consider MRI later)...    Chol> on Cres20; FLP 3/16 showed TChol 174, TG 200, HDL 45, LDL 89; rec to continue same med & diet efforts...    Hypothyroid> on Synthroid 198mcg/d; labs 3/16 showed TSH= 0.11 & she is clinically euthyroid, rec to step down her Syntroid dose to 13mcg/d...    Colon polyps> last colon was 11/10 w/ one adenomatous polyp removed, f/u planned by DrDBrodie 3/16- pending...    Breast cancer> followed by DrMagrinat Q38mo, DrWakefield, DrMurray w/ prev surg, chemoRx (triple neg tumor) and XRT- notes reviewed; she has decided against reconstruction...    DJD, FM, Osteop> on Calcium, MVI, OTC analgesics; BMD followed by her GYN...    Anxiety> on EffexorXR75 and she reports stable on this med...  We reviewed prob list, meds,  xrays and labs> see below for updates >>   LABS 3/16:  FLP- ok on Cres20 x TG=200;  Chems- wnl;  CBC- wnl;  TSH=0.11 on Synthroid125... PLAN>> Calvary is stable & in good spirits; she is asked to follow a low chol/ low fat diet & work on wt reduction; her TSH is oversuppressed on Synthroid125 & we will step her down to 131mcg/d...   ~  April 20, 2015:  70mo ROV & Debra Mcintyre reports doing well, feeling well, no new complaints or concerns;  She continues to f/u w/ DrMagrinat & DrWakefield yearly (alternating Q48mo)- she saw DrMagrinat 8/16 & stable, no evid dis recurrence... We reviewed the following medical problems during today's office visit >>     AbnCXR> CXR 3/15 w/ new RUL airspace dis & CTChest 3/15 revealed GG opacities prob related  to radiation therapy; incidental 31m nodule in RML w/o ch from 2013, atherosclerotic calcif, no adenopathy; f/u film 9/15=> improved, and serial f/u film 9/16=> clear, wnl...    HBP> on MetopER100, Lisin20; BP= 124/76 & she denies CP, palpit, SOB, edema; we discussed increasing her physical activity...    Cerebrovasc Dis> on ASA325 & Plavix75; Hx cerebrovasc dis, tiny aneurysm & TIA; stable w/o cerebral ischemic symptoms- 9/16 she mentioned that she'd like to have f/u MRI/MRA to recheck vasc status and the sm MCA aneurysm...    Chol> on Cres20; FLP 3/16 showed TChol 174, TG 200, HDL 45, LDL 89; rec to continue same med & diet efforts...    Hypothyroid> on Synthroid 1275m/d; labs 3/16 showed TSH= 0.11 & she is clinically euthyroid, rec to step down her Syntroid dose to 11283md...    Colon polyps> she had f/u colonoscopy 6/16 by DrDBrodie- 3mm39mssile polyp removed from the desc colon=> tubular adenoma & they rec f/u colon in 5 yrs...    Breast cancer> Hx right breast cancer Dx 2/14 w/ ChemoRx, Surg, XRT; she continues f/u w/ DrMagrinat & DrWakefield Q15mo;53mot seen 8/16 & stable- no known recurrence, f/u mammogram left breast 8/16 remains neg...     DJD, FM, Osteop> on  Calcium, MVI, OTC analgesics; [facility] distal [facility] radius; encouraged to continue supplements and wt bearing exercise...    Anxiety> on EffexorXR75 and she reports stable on this med...  EXAM showed Afeb, VSS, O2sat=96%;  HEENT- neg, mallampati2;  Chest- clear w/o w/r/r;  Heart- RR w/o m/r/g;  Abd- soft, neg;  Ext- w/o c/c/e;  Neuro- intact w/o focal neuro deficits...   CXR 9/16 showed norm heart size, clear lungs, +scoliosis, lower thoracic compression deformity is stable, NAD...  EMarland KitchenMarland Kitchen 04/2015 showed NSR, rate65, IRBBB, otherw norm EKG, NAD...  LABS 04/2015:  Chems- wnl;  CBC- wnl  MRI / MRA Brain => pending IMP/PLAN>>  JeannMerisainues to do well- no new complaints or concerns, but she is worried about the fact that she hasn't has a f/u MRI/MRA in 1yrs;42yr continues stable w/o cerebral ischemic symptoms on ASA /Plavix; we will order the scan for follow up;  She needs to increase her exercise program & will consider [employer] [facility] [employer] [facility] the cancer center classes...  ADDENDUM>>  MRI done 05/04/15- for some reason MRA was not done;  No infarct or hemorrhage, no intracranial mass or abn enhancement; mod small vessel dis & mild global atrophy; she needs MRA to assess the 1.5-2mm ri34m MCA bifuraction aneurysm-- I discussed w/ pt & she decided to forego the MRA at this time since she remains asymptomatic...   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 We reviewed  the following medical problems during today's office visit >>     AbnCXR> CXR 3/15 w/ new RUL airspace dis & CTChest 3/15 revealed GG opacities prob related to radiation therapy;  incidental 24m nodule in RML w/o ch from 2013, atherosclerotic calcif, no adenopathy; f/u film 9/15=> improved, and serial f/u film 9/16=> clear, wnl...    HBP> on MetopER100, Lisin20; BP= 124/70 & she denies CP, palpit, SOB, edema; we discussed increasing her physical activity...    Cerebrovasc Dis> on ASA325 & Plavix75; Hx cerebrovasc dis, tiny aneurysm & TIA; stable w/o cerebral ischemic symptoms- 9/16 she mentioned that she'd like to have f/u MRI/MRA to recheck vasc status and the sm MCA aneurysm=> mod small vessel dis, no evid intracranial mets, right MCA bifurcation aneurysm ~26msize needs MRA or CTA to properly assess but she decided to hold off on additional scans for now...    Chol> on Cres20; FLP 3/17 showed TChol 195, TG 184, HDL 49, LDL 110; rec to continue same med & diet efforts...    Hypothyroid> on Synthroid 11218md; labs 3/17 showed TSH= 2.23 & she is clinically euthyroid- continue same    Colon polyps> she had f/u colonoscopy 6/16 by DrDBrodie- 3mm64mssile polyp removed from the desc colon=> tubular adenoma & they rec f/u colon in 5 yrs...    Breast cancer> Hx right breast cancer Dx 2/14 w/ ChemoRx, Surg, XRT; she continues f/u w/ DrMagrinat & DrWakefield Q32mo;49mot seen 8/16 & stable- no known recurrence, she had ultrasound bx 11/16 of right chest wall- NEG    DJD, FM, Osteop> on Calcium, MVI, OTC analgesics; [facility]-1.5 in distal left radius; encouraged to continue supplements and wt bearing exercise...    Anxiety> on EffexorXR75 and she reports stable on this med...  EXAM showed Afeb, VSS, O2sat=96%;  HEENT- neg, mallampati2;  Chest- clear w/o w/r/r;  Heart- RR w/o m/r/g;  Abd- soft, neg;  Ext- w/o c/c/e;  Neuro- intact w/o focal neuro deficits...   LABS 10/22/15>  FLP- ok on  Cres20 w/ LDL=110;  Chems- wnl x BS=106;  CBC- wnl;  TSH=2.23;  VitD=47... 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 ... ~ Marland KitchenFollowed by DrMagrinat, DrWakefield, DrMurray w/ prev surg, chemoRx and XRT => SUMMARY:    1) status post right breast and right axillary lymph node biopsies February 2014 of a clinical T2, pN1, stage IIB invasive ductal carcinoma, grade 3, triple negative, with an MIB-1 of 15%.    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    4)status post right mastectomy and sentinel lymph node sampling 04/23/2013 for a residual pT1b pN1 invasive ductal carcinoma, grade 3, again triple negative [2/10 sample lymph nodes were involved by tumor, both with micrometastatic deposits -- NOTE: she opted against reconstruction.    5) adjuvant radiation completed 08/13/2013    6)  the patient underwent genetics counseling but declined testing. ~  2015: She reports that 42 y/o sister was Dx w/ breast cancer this yr...  ABNORMAL CXR >>  ~  3/15:  CXR showed new RUL airspace dis & CTChest 3/15 revealed GG opacities prob related to radiation therapy; incidental 76m nodule in RML w/o ch from 2013, atherosclerotic calcif, no  adenopathy... ~  9/15:  CXR 9/15 showed no infiltrate, opacity, etc; scoliosis & stable compression deformity; port has been removed... ~  9/16:  CXR 9/16 showed norm heart size, clear lungs, +scoliosis, lower thoracic compression deformity is stable, NAD...  HYPERTENSION (ICD-401.9) > ~  on TOPROL XL '100mg'$ /d,  LISINOPRIL/ Hct 20/12.5 daily; BP well controlled on these meds; denies HA, fatigue, visual changes, CP, palipit, dizziness, syncope, dyspnea, edema, etc; she does water aerobics & walks for exercise...  ~  CXR 8/12 showed normal heart size, clear lungs, scoliosis, NAD... ~  10/12:  Presents w/ chest discomfort> EKG showed NSR, rate 69, rsr' in V1-2, otherw wnl; referred to Cards=> she requests DrKelly. ~  11/13:  BP= 128/82 & she remains asymptomatic... ~  7/14: on MetopER100 & LisinHCT20-12.5; BP= 128/72 but has been volatile at home & in Oncology office prompting DrMagrinat to change to Lisin10 & stop the Hct in light of her 103 wt loss...  ~  3/15: on MetopER100, Lisin20; BP= 112/64 & she denies CP, palpit, SOB, edema ~  9/15: on MetopER100, Lisin20; BP= 122/68 & she denies CP, palpit, SOB, edema; she wants to decr the MeptopER to '50mg'$ /d => but she never did... ~  3/16: on MetopER100, Lisin20; BP= 112/64 & she denies CP, palpit, SOB, edema ~  9/16: on MetopER100, Lisin20; BP= 124/76 & she remains asymptomatic; we discussed increasing her physical activity...  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 she's been stable since that time w/ out pt f/u by IR, [name]> she indicates he said no further studies needed. ~  CDopplers 5/04 showed mild plaque, no signif ICA stenoses... ~  MR studies 10/09 showed sm vessel dis; poss restenosis left MCA w/ angiogram rec- but wasn't  done; mod stenosis at origin of left vertebral art w/ tortuosity... ~  Carotid arteriogram 12/10 showed stable mild residual left middle cerebral art stenosis at site of prev angioplasty, and stable 1.5 to 12m34maccular right middle cerebral art aneurysm... ~  10/12:  she remains asymptomatic- w/o cerebral ischemic symptoms... ~  10/13:  she continues stable on ASA/ Plavix w/o cerebral ischemic symptoms... ~  3/15 & 9/15: on ASA325 & Plavix75; s/p aneurysm & TIA; stable w/o cerebral ischemic symptoms- she has not had f/u DrDeveshwar-IR- she notes she was told no need for f/u angiography (we might consider MRI later) ~  3/16: [place] cerebral ischemic symptoms & is stable... ~  9/16: stable on ASA/ Plavix and we decided to repeat her MRI/ MRA => pending  HYPERCHOLESTEROLEMIA (ICD-272.0) >>  ~  FLP 3/08 shows Tchol 180, TG 206, HDL 49, LDL 105... rec same meds, better diet, get wt down. ~  FLPNespelem/09 on Lip80 showed TChol 150, TG 178, HDL 41, LDL 74 ~  FLP 1/11 on Lip80+Zetia10 showed TChol 162, TG 226, HDL 47, LDL 95... she wants to stop Zetia, get on diet & get wt  down. ~  FLP 1/12 on Lip80 showed TChol 173, TG 164, HDL 45, LDL 95 ~  FLP 10/13 on Lip80 intermittently showed TChol 189, TG 231, HDL 45, LDL 112... She was c/o paresthesias that she was convinced was due to the Lip80 & she requested change to CRESTOR '20mg'$ /d... ~  [place] 4/14 on Cres20 showed TChol 167, TG 141, HDL 46, LDL 93 ~  FLP 3/15 on Cres20 showed TChol 164, TG 116, HDL 49, LDL 92 ~  FLP 3/16 on Cres20 showed TChol 174, TG 200, HDL 45, LDL 89; we reviewed diet, exercise, wt reduction...  HYPOTHYROIDISM (ICD-244.9) - on SYNTHROID 118mg/d... ~  labs 3/08 showed TSH = 0.23 ~  labs 10/09 showed TSH= 0.64 ~  labs 1/11 showed TSH= 0.18... she wants to keep same dose to aide wt reduction. ~  labs 1/12 showed TSH= 0.20... Ditto ~  Labs 10/13 on Levo125 showed TSH= 1.20 ~  Labs 3/15 on Synthroid125 showed TSH=  0.35 ~  Labs 3/16 on Synthroid125 showed TSH= 0.11; we decided to step down her Synthroid dose to 1178m/d...  COLONIC POLYPS (ICD-211.3), & Hx of HEMORRHOIDS (ICD-455.6) ~  colonoscopy 10/00 by DrDBrodie showed hems only... f/u planned 1081yr~  f/u colonoscopy 11/10 showed 2 polyps- one adenomatous w/ f/u planned 85yr585yr  she had f/u colonoscopy 6/16 by DrDBrodie- 3mm 39msile polyp removed from the desc colon=> tubular adenoma & they rec f/u colon in 5 yrs...  DEGENERATIVE JOINT DISEASE (ICD-715.90) - uses Tylenol & OTC meds as needed... [facility] right [facility] area... [facility]- no surg necessary... ~  1/11:  notes some pain in hands & wrist... ~  1/12:  Stable w/o acute complaints or problem areas... ~  2014: she developed LBP & hip pain while receiving XRT for her breast cancer; eval by DrMagrinat & DrCaffrey w/ epid steroid shot that really helped... ~  She has a scoliosis and compression deformity in lower TSpine...  Hx of FIBROMYALGIA (ICD-729.1)  OSTEOPOROSIS (ICD-733.00) - she indicates that this is followed & managed by GYN, DrCousins> ?when last BMD was done "she keeps up w/ this" > on Calcium, & Vitamins... ~  labs 1/11 showed Vit D level = 54... ~  Pt again reiterates that GYN follows her BMDs & is up to date... ~  [facility] T[facility]-1.5 in distal left radius; encouraged to continue supplements and wt bearing exercise...  TIA (ICD-435.9) - as above, she remains on ASA325 & PLAVIX75... last saw DrReynolds in 2006... 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 arteriogram by Dr. TDeveFleeta Emmerirmed a web-like plaque in the left M1 segment of the left MCA with signif stenosis...  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 subseq PTA of left MCA w/ good result- resid 20%  stenosis seen on f/u angiograms along w/ a 1mm s33mular aneurysm seen in the right MCA trifurcation...  ANXIETY (ICD-300.00) - on EFFEXOR '75mg'$ /d for hot flashes, she says.... she wishes to continue the med "it keeps me on an even keel".  Health Maintenance - GYN= DrCousins & she will call for f/u... [facility]... ~  Immunizations: she refuses Flu vaccine... given PNEUMOVAX- 1/11, and Tdap- 1/11...   Past Surgical History  Procedure Laterality Date  .  Left middle cerebral artery angioplasty  2004    by [name] had 2 follow up occurances  . Portacath placement N/A 11/05/2012    Procedure: INSERTION PORT-A-CATH;  Surgeon: [name], MD;  Location: [place];  Service: General;  Laterality: N/A;  . Breast biopsy Right 10/23/2012  . Breast biopsy Right 10/08/2012  . Mastectomy w/ sentinel node biopsy Right 04/23/2013    Procedure: RIGHT TOTAL MASTECTOMY WITH SENTINEL LYMPH NODE BIOPSY;  Surgeon: [name], MD;  Location: [place];  Service: General;  Laterality: Right;  . Colonoscopy    . Colonoscopy w/ polypectomy    . Esophagogastroduodenoscopy    . Radiology with anesthesia N/A 05/04/2015    Procedure: MRI BRAIN WITH AND WITHOUT;  Surgeon: Medication Radiologist, MD;  Location: [place];  Service: Radiology;  Laterality: N/A;    Outpatient Encounter Prescriptions as of 10/18/2015  Medication Sig  . aspirin EC 325 MG tablet Take 325 mg by mouth every morning.   . Calcium Carbonate-Vitamin D (CALTRATE 600+D PO) Take 600 mg by mouth 2 (two) times daily.  . clopidogrel (PLAVIX) 75 MG tablet TAKE ONE TABLET BY MOUTH ONCE DAILY (Patient taking differently: TAKE ONE TABLET BY MOUTH AT BEDTIME)  . CRESTOR 20 MG tablet TAKE ONE TABLET BY MOUTH ONCE DAILY (Patient taking differently: TAKE ONE TABLET BY MOUTH AT BEDTIME)  . gabapentin (NEURONTIN) 300 MG capsule Take 1 capsule (300 mg total) by mouth at bedtime.  (Patient taking differently: Take 300 mg by mouth See admin instructions. Take 1 capsule (300 mg) in the morning and at bedtime)  . lisinopril (PRINIVIL,ZESTRIL) 20 MG tablet TAKE ONE TABLET BY MOUTH ONCE DAILY (Patient taking differently: TAKE ONE TABLET BY MOUTH AT BEDTIME)  . loratadine (CLARITIN) 10 MG tablet Take 10 mg by mouth daily as needed (seasonal allergies).  . metoprolol succinate (TOPROL-XL) 100 MG 24 hr tablet TAKE ONE TABLET BY MOUTH ONCE DAILY (Patient taking differently: TAKE 1/2 TABLET BY MOUTH ONCE DAILY)  . metroNIDAZOLE (METROCREAM) 0.75 % cream Apply 1 application topically 2 (two) times daily. For rosacea  . Multiple Vitamin (MULTIVITAMIN WITH MINERALS) TABS tablet Take 1 tablet by mouth every morning. Centrum Silver  . SYNTHROID 112 MCG tablet TAKE ONE TABLET BY MOUTH ONCE DAILY BEFORE BREAKFAST  . Tetrahydrozoline HCl (VISINE OP) Place 1 drop into both eyes daily as needed (dry eyes).  . venlafaxine XR (EFFEXOR-XR) 75 MG 24 hr capsule TAKE ONE CAPSULE BY MOUTH ONCE DAILY (Patient taking differently: TAKE ONE CAPSULE BY MOUTH AT BEDTIME)  . [DISCONTINUED] metoprolol succinate (TOPROL-XL) 50 MG 24 hr tablet Take 50 mg by mouth at bedtime. Take with or immediately following a meal.   No facility-administered encounter medications on file as of 10/18/2015.    No Known Allergies   Current Medications, Allergies, Past Medical History, Past Surgical History, Family History, and Social History were reviewed in [employer] record.    Review of Systems    Constitutional:  Denies F/C/S, appetite is good, weight is stable ~150# HEENT:  No HA, visual changes, earache, nasal symptoms, sore throat, hoarseness. Resp:  No cough, sputum, hemoptysis; +SOB/DOE w/o tightness or wheezing. Cardio:  No CP, palpit, orthopnea, edema; notes some DOE GI:  Denies N/V/D/C or blood in stool; no reflux, abd pain, distention, or gas. GU:  No dysuria, freq, urgency, hematuria,  or flank pain. MS:  Denies joint pain, swelling, tenderness; no neck pain, back pain, etc. Neuro:  No tremors, seizures, dizziness, syncope;  prev weakness & numbness improved, gait is ok... Skin:  No suspicious lesions or skin rash. Heme:  No bruising, bleeding, etc; axillary adenopathy is resolved Psyche: Denies confusion, sleep disturbance, hallucinations, anxiety, depression.   Objective:   Physical Exam      WD, WN, 75 y/o WF in NAD... GENERAL:  Alert & oriented; pleasant & cooperative... HEENT:  [place]/AT, EOM-wnl, PERRLA, EACs-clear, TMs-wnl, NOSE-clear, THROAT-clear & wnl. NECK:  Supple w/ fairROM; no JVD; normal carotid impulses w/o bruits; no thyromegaly or nodules palpated; no lymphadenopathy. CHEST:  Clear to P&A w/o wheezing, rales, or rhonchi detected... HEART:  Regular Rhythm; without murmurs/ rubs/ or gallops. ABDOMEN:  Soft & nontender; normal bowel sounds; no organomegaly or masses palpated. EXT: without deformities, mild arthritic changes; no varicose veins/ +venous insuffic/ no edema. NEURO:  CN's intact;  no focal neuro deficits... DERM:  No lesions noted; no rash etc...  RADIOLOGY DATA:  Reviewed in the EPIC EMR & discussed w/ the patient...  LABORATORY DATA:  Reviewed in the EPIC EMR & discussed w/ the patient...   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 She continues f/u w/ DrMagrinat & DrWakefield yearly...  ASTHMATIC BRONCHITIS>  No recent URIs or resp exac & she is once again not on regular meds, denies breathing problems, etc... Abn CXR>  Faint GG opacity in RUL has resolved (likely related to XRT); she is at baseline, stable...  HBP>  Controlled on BBlocker, & ACE, she continues to watch BP at home...  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 f/u MRI 11/16 showed mod small vessel dis, no evid intracranial mets, right MCA bifurcation aneurysm ~265msize needs MRA or CTA to properly assess but she decided to hold off on additional scans for now...  CHOL>  Controlled on Cres20 (she was intol to Lip80), needs better low fat diet...  HPYOTHYROID>  On Synthroid 12524md w/ TSH = 0/11 so we will step down her dose to 112m14m=> euthyroid...  Other medical problems as noted... 10/18/15>   JeanJaasia been stable & the right breast scare from 11/16 turned out to be inflammed adipose tissue/ scar;  Labs are OK & she continues to work on diet/ exercise, continue same meds; we discussed ROV recheck in 14mo.314moatient's Medications  New Prescriptions   No medications on file  Previous Medications   ASPIRIN EC 325 MG TABLET    Take 325 mg by mouth every morning.    CALCIUM CARBONATE-VITAMIN D (CALTRATE 600+D PO)    Take 600 mg by mouth 2 (two) times daily.   CLOPIDOGREL (PLAVIX) 75 MG TABLET    TAKE ONE TABLET BY MOUTH ONCE DAILY   CRESTOR 20 MG TABLET    TAKE ONE TABLET BY MOUTH ONCE DAILY   GABAPENTIN (NEURONTIN) 300 MG CAPSULE    Take 1 capsule (300 mg total) by mouth at bedtime.   LISINOPRIL (PRINIVIL,ZESTRIL) 20 MG TABLET    TAKE ONE TABLET BY MOUTH ONCE DAILY   LORATADINE (CLARITIN) 10 MG TABLET    Take 10 mg by mouth daily as needed (seasonal allergies).   METOPROLOL SUCCINATE (TOPROL-XL) 100 MG 24 HR TABLET    TAKE ONE TABLET BY MOUTH  ONCE DAILY   METRONIDAZOLE (METROCREAM) 0.75 % CREAM    Apply 1 application topically 2 (two) times daily. For rosacea   MULTIPLE VITAMIN (MULTIVITAMIN WITH MINERALS) TABS TABLET    Take 1 tablet by mouth every morning. Centrum Silver   SYNTHROID 112 MCG TABLET    TAKE ONE TABLET BY MOUTH ONCE DAILY BEFORE BREAKFAST   TETRAHYDROZOLINE HCL (VISINE OP)    Place 1 drop into both eyes daily as needed  (dry eyes).   VENLAFAXINE XR (EFFEXOR-XR) 75 MG 24 HR CAPSULE    TAKE ONE CAPSULE BY MOUTH ONCE DAILY  Modified Medications   No medications on file  Discontinued Medications   METOPROLOL SUCCINATE (TOPROL-XL) 50 MG 24 HR TABLET    Take 50 mg by mouth at bedtime. Take with or immediately following a meal.

## 2015-10-18 NOTE — Patient Instructions (Signed)
Today we updated your med list in our EPIC system...    Continue your current medications the same...  Please return to our lab one morning this week for your FASTING blood work...    We will contact you w/ the results when available...   Call for any questions...  Let's plan a follow up visit in 61mo sooner if needed for problems..Marland KitchenMarland Kitchen

## 2015-10-22 ENCOUNTER — Other Ambulatory Visit (INDEPENDENT_AMBULATORY_CARE_PROVIDER_SITE_OTHER): Payer: Medicare Other

## 2015-10-22 DIAGNOSIS — I10 Essential (primary) hypertension: Secondary | ICD-10-CM

## 2015-10-22 DIAGNOSIS — M159 Polyosteoarthritis, unspecified: Secondary | ICD-10-CM

## 2015-10-22 DIAGNOSIS — M810 Age-related osteoporosis without current pathological fracture: Secondary | ICD-10-CM

## 2015-10-22 DIAGNOSIS — E039 Hypothyroidism, unspecified: Secondary | ICD-10-CM

## 2015-10-22 DIAGNOSIS — M150 Primary generalized (osteo)arthritis: Secondary | ICD-10-CM | POA: Diagnosis not present

## 2015-10-22 DIAGNOSIS — K219 Gastro-esophageal reflux disease without esophagitis: Secondary | ICD-10-CM | POA: Diagnosis not present

## 2015-10-22 DIAGNOSIS — E7800 Pure hypercholesterolemia, unspecified: Secondary | ICD-10-CM

## 2015-10-22 LAB — CBC WITH DIFFERENTIAL/PLATELET
BASOS PCT: 1 % (ref 0.0–3.0)
Basophils Absolute: 0 10*3/uL (ref 0.0–0.1)
EOS ABS: 0.2 10*3/uL (ref 0.0–0.7)
Eosinophils Relative: 4.3 % (ref 0.0–5.0)
HEMATOCRIT: 42.2 % (ref 36.0–46.0)
Hemoglobin: 14.5 g/dL (ref 12.0–15.0)
LYMPHS ABS: 1.8 10*3/uL (ref 0.7–4.0)
Lymphocytes Relative: 36.4 % (ref 12.0–46.0)
MCHC: 34.4 g/dL (ref 30.0–36.0)
MCV: 87.1 fl (ref 78.0–100.0)
MONO ABS: 0.4 10*3/uL (ref 0.1–1.0)
Monocytes Relative: 8.2 % (ref 3.0–12.0)
NEUTROS ABS: 2.4 10*3/uL (ref 1.4–7.7)
Neutrophils Relative %: 50.1 % (ref 43.0–77.0)
PLATELETS: 268 10*3/uL (ref 150.0–400.0)
RBC: 4.84 Mil/uL (ref 3.87–5.11)
RDW: 13.5 % (ref 11.5–15.5)
WBC: 4.9 10*3/uL (ref 4.0–10.5)

## 2015-10-22 LAB — VITAMIN D 25 HYDROXY (VIT D DEFICIENCY, FRACTURES): VITD: 47.04 ng/mL (ref 30.00–100.00)

## 2015-10-22 LAB — LIPID PANEL
CHOL/HDL RATIO: 4
Cholesterol: 195 mg/dL (ref 0–200)
HDL: 48.9 mg/dL (ref >39.00)
LDL CALC: 110 mg/dL — AB (ref 0–99)
NonHDL: 146.45
TRIGLYCERIDES: 184 mg/dL — AB (ref 0.0–149.0)
VLDL: 36.8 mg/dL (ref 0.0–40.0)

## 2015-10-22 LAB — COMPREHENSIVE METABOLIC PANEL
ALT: 20 U/L (ref 0–35)
AST: 22 U/L (ref 0–37)
Albumin: 4.6 g/dL (ref 3.5–5.2)
Alkaline Phosphatase: 45 U/L (ref 39–117)
BUN: 13 mg/dL (ref 6–23)
CALCIUM: 10 mg/dL (ref 8.4–10.5)
CHLORIDE: 105 meq/L (ref 96–112)
CO2: 29 meq/L (ref 19–32)
Creatinine, Ser: 0.71 mg/dL (ref 0.40–1.20)
GFR: 85.74 mL/min (ref >60.00)
Glucose, Bld: 106 mg/dL — ABNORMAL HIGH (ref 70–99)
POTASSIUM: 4.3 meq/L (ref 3.5–5.1)
Sodium: 142 mEq/L (ref 135–145)
Total Bilirubin: 0.8 mg/dL (ref 0.2–1.2)
Total Protein: 7.3 g/dL (ref 6.0–8.3)

## 2015-10-22 LAB — TSH: TSH: 2.23 u[IU]/mL (ref 0.35–4.50)

## 2015-10-22 NOTE — Progress Notes (Signed)
Quick Note:  Called spoke with patient, advised of lab results / recs as stated by PM. Pt verbalized her understanding and denied any questions. ______

## 2015-10-23 ENCOUNTER — Other Ambulatory Visit: Payer: Self-pay | Admitting: Pulmonary Disease

## 2015-10-27 DIAGNOSIS — H2511 Age-related nuclear cataract, right eye: Secondary | ICD-10-CM | POA: Diagnosis not present

## 2015-11-02 ENCOUNTER — Other Ambulatory Visit: Payer: Self-pay | Admitting: Pulmonary Disease

## 2015-11-22 ENCOUNTER — Other Ambulatory Visit: Payer: Self-pay | Admitting: Pulmonary Disease

## 2016-02-12 ENCOUNTER — Encounter: Payer: Self-pay | Admitting: Pulmonary Disease

## 2016-02-29 ENCOUNTER — Other Ambulatory Visit: Payer: Self-pay | Admitting: Pulmonary Disease

## 2016-03-07 DIAGNOSIS — D2272 Melanocytic nevi of left lower limb, including hip: Secondary | ICD-10-CM | POA: Diagnosis not present

## 2016-03-13 NOTE — Telephone Encounter (Signed)
This note was enetered in error  J. Shirl Harris, MD 03/13/2016, 5:04 AM [facility] Pager [phone] After 3 pm or if no answer, call [phone]

## 2016-03-21 ENCOUNTER — Other Ambulatory Visit (HOSPITAL_BASED_OUTPATIENT_CLINIC_OR_DEPARTMENT_OTHER): Payer: Medicare Other

## 2016-03-21 DIAGNOSIS — C50411 Malignant neoplasm of upper-outer quadrant of right female breast: Secondary | ICD-10-CM | POA: Diagnosis present

## 2016-03-21 DIAGNOSIS — M810 Age-related osteoporosis without current pathological fracture: Secondary | ICD-10-CM

## 2016-03-21 LAB — COMPREHENSIVE METABOLIC PANEL
ALBUMIN: 4.1 g/dL (ref 3.5–5.0)
ALK PHOS: 54 U/L (ref 40–150)
ALT: 20 U/L (ref 0–55)
ANION GAP: 7 meq/L (ref 3–11)
AST: 19 U/L (ref 5–34)
BILIRUBIN TOTAL: 0.76 mg/dL (ref 0.20–1.20)
BUN: 11.1 mg/dL (ref 7.0–26.0)
CALCIUM: 10.1 mg/dL (ref 8.4–10.4)
CO2: 28 mEq/L (ref 22–29)
CREATININE: 0.8 mg/dL (ref 0.6–1.1)
Chloride: 104 mEq/L (ref 98–109)
EGFR: 74 mL/min/{1.73_m2} — AB (ref >90)
Glucose: 96 mg/dl (ref 70–140)
Potassium: 4.5 mEq/L (ref 3.5–5.1)
Sodium: 139 mEq/L (ref 136–145)
TOTAL PROTEIN: 7.1 g/dL (ref 6.4–8.3)

## 2016-03-21 LAB — CBC WITH DIFFERENTIAL/PLATELET
BASO%: 0.9 % (ref 0.0–2.0)
Basophils Absolute: 0.1 10*3/uL (ref 0.0–0.1)
EOS ABS: 0.2 10*3/uL (ref 0.0–0.5)
EOS%: 3 % (ref 0.0–7.0)
HEMATOCRIT: 41 % (ref 34.8–46.6)
HEMOGLOBIN: 13.5 g/dL (ref 11.6–15.9)
LYMPH#: 2 10*3/uL (ref 0.9–3.3)
LYMPH%: 31.7 % (ref 14.0–49.7)
MCH: 29.3 pg (ref 25.1–34.0)
MCHC: 33 g/dL (ref 31.5–36.0)
MCV: 88.7 fL (ref 79.5–101.0)
MONO#: 0.6 10*3/uL (ref 0.1–0.9)
MONO%: 9.1 % (ref 0.0–14.0)
NEUT%: 55.3 % (ref 38.4–76.8)
NEUTROS ABS: 3.4 10*3/uL (ref 1.5–6.5)
PLATELETS: 270 10*3/uL (ref 145–400)
RBC: 4.62 10*6/uL (ref 3.70–5.45)
RDW: 13.4 % (ref 11.2–14.5)
WBC: 6.2 10*3/uL (ref 3.9–10.3)

## 2016-03-23 DIAGNOSIS — Z961 Presence of intraocular lens: Secondary | ICD-10-CM | POA: Diagnosis not present

## 2016-03-28 ENCOUNTER — Telehealth: Payer: Self-pay | Admitting: Oncology

## 2016-03-28 ENCOUNTER — Ambulatory Visit (HOSPITAL_BASED_OUTPATIENT_CLINIC_OR_DEPARTMENT_OTHER): Payer: Medicare Other | Admitting: Oncology

## 2016-03-28 VITALS — BP 131/58 | HR 93 | Temp 97.8°F | Resp 18 | Ht 68.0 in | Wt 160.7 lb

## 2016-03-28 DIAGNOSIS — Z853 Personal history of malignant neoplasm of breast: Secondary | ICD-10-CM

## 2016-03-28 DIAGNOSIS — C50411 Malignant neoplasm of upper-outer quadrant of right female breast: Secondary | ICD-10-CM

## 2016-03-28 NOTE — Telephone Encounter (Signed)
appt made and avs printed °

## 2016-03-28 NOTE — Progress Notes (Signed)
ID: Debra Mcintyre   DOB: 05-21-1941  MR#: [id number]  IWP#:[id number]  PCP: Debra Space, MD GYN: Debra Mcintyre SU: Debra Mcintyre OTHER MD: Debra Mcintyre, Debra Mcintyre, Debra Mcintyre, Debra Mcintyre,   HISTORY OF PRESENT ILLNESS: From the original intake note:  Debra Mcintyre had screening mammography at [facility] 04/10/2012 raising the question of some axillary lymph nodes on the right. Additional views of the right axilla and right axillary ultrasound performed 04/04/2012 showed 3 lymph nodes that appeared larger than prior. The largest measured 1.5 cm. 2 of them had cortical thickening. Close followup was suggested, and repeat right axillary ultrasound 06/25/2012 showed no significant change in the lymph nodes in question. Followup ultrasound in 3 months was recommended, but in the interim the patient saw her primary physician, Debra Mcintyre and ADL, and he set her up for a chest CT on 07/08/2012, which showed a mild asymmetric density in the upper mid right breast. There was a prominent right axillary lymph node measuring 1.9 cm, with a few smaller adjacent nodes asymmetric with compared to the left side. Again, short interval followup was recommended, and on 10/07/2012 the patient had digital right mammography and ultrasonography, now at the breast Center. Debra Mcintyre noted pleomorphic calcifications spanning an area of 6.3 cm without associated mass. Physical exam was unremarkable. Ultrasound showed an area of adenopathy in the right axilla measuring 1.6 cm, with a second lymph node with a thickened cortex measuring 1.2 cm. No suspicious mass was seen by ultrasound in the right breast.  Biopsy of the larger axillary lymph node on 10/07/2012 showed ([id number]) an invasive ductal carcinoma, triple negative, with an MIB-1 of 66%. Biopsy of the right breast the next day, [id number]) showed invasive ductal carcinoma, grade 3. Breast MRI 10/14/2012 showed a total area of irregular enhancement in the right breast  measuring up to 12 cm. MRI guided biopsy of an area in the upper inner quadrant of the right breast on 10/23/2012 showed ([id number]) ductal carcinoma in situ, with foci worrisome for invasion.  The patient's subsequent history is as detailed below  INTERVAL HISTORY: [name] returns today for followup of her triple negative right breast cancer. She is generally doing well. The biggest problem she is having is her husband falling down stairs, cracking several ribs and also cracking his skull. He is now back home after being in the ICU for some time. This all happened in [place] which made it all doubly difficult.  With her last mammography there was a question regarding change near the right mastectomy scar. This was biopsied November 2016 and found to be fat necrosis.  REVIEW OF SYSTEMS: She still has some neuropathy symptoms particularly involving the feet. She doesn't evidence a hands. Gabapentin was not helpful for that and it was also not that helpful for the nighttime sweats so she is no longer taking that. She has some pain in the right arm at times which comes and goes. She feels a little forgetful. Otherwise a detailed review of systems today was stable  PAST MEDICAL HISTORY: Past Medical History:  Diagnosis Date  . Breast cancer ([place]) 09/2012   right breast/right axillary lymph node t2,pn1, stage 11b, invasive ductal carcinma, grade 3, triple negative, with an mib-1 of 15%  . Cerebral aneurysm, nonruptured   . Cerebrovascular disease, unspecified   . Colonic polyp   . Dizziness    pt. states that she experiences this occassionally 05/03/15  . Headache    occassionally  . Heart murmur   .  History of chemotherapy   . Hx of radiation therapy 05/27/13-08/13/13   right chest wall/right supraclavicular/axillary region 5220 cGy 29 sessions, right mastectomy/chest wall boost cGy 5 sessions  . Hypertension    Does not see a cardiologist  . Hypothyroidism   . Neuropathy ([place])   . Pure  hypercholesterolemia   . Rosacea   . Seasonal allergies   . TIA (transient ischemic attack)   . Wears glasses     PAST SURGICAL HISTORY: Past Surgical History:  Procedure Laterality Date  . BREAST BIOPSY Right 10/23/2012  . BREAST BIOPSY Right 10/08/2012  . COLONOSCOPY    . COLONOSCOPY W/ POLYPECTOMY    . ESOPHAGOGASTRODUODENOSCOPY    . Left middle cerebral artery angioplasty  2004   by [name] had 2 follow up occurances  . MASTECTOMY W/ SENTINEL NODE BIOPSY Right 04/23/2013   Procedure: RIGHT TOTAL MASTECTOMY WITH SENTINEL LYMPH NODE BIOPSY;  Surgeon: Debra Bookbinder, MD;  Location: [facility];  Service: General;  Laterality: Right;  . PORTACATH PLACEMENT N/A 11/05/2012   Procedure: INSERTION PORT-A-CATH;  Surgeon: Debra Bookbinder, MD;  Location: [place];  Service: General;  Laterality: N/A;  . RADIOLOGY WITH ANESTHESIA N/A 05/04/2015   Procedure: MRI BRAIN WITH AND WITHOUT;  Surgeon: Medication Radiologist, MD;  Location: [facility];  Service: Radiology;  Laterality: N/A;    FAMILY HISTORY Family History  Problem Relation Age of Onset  . Lung cancer Mother 46  . Heart disease Father   . Colon cancer Brother 27  . Alcohol abuse Brother   . Colon cancer Maternal Uncle     dx in his 10s  . Colon cancer Maternal Aunt   . Ovarian cancer Other 76  . Lung cancer Maternal Aunt   . Breast cancer Sister    the patient's father died at the age of 29. The patient's mother died at the age of 68 from lung cancer. She was not a smoker. The cancer was diagnosed when she was 73 years old. The patient's mother had 2 sisters and one brother. One of those sisters had lung cancer and a brother had colon cancer, both diagnosed in their 65s. The patient herself has one brother who died from colon cancer at the age of 25. That brother has a daughter who was diagnosed with uterine cancer at the age of 73. All this raises the question of possible Lynch syndrome and I have referred the  patient for genetics counseling. The patient does have one sister who was diagnosed with breast cancer in 2015.  GYNECOLOGIC HISTORY: Menarche age 36, menopause around 23. The patient is GX P0. She took birth control pills for many years with no complications.  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 Her stepchildren are [name] who lives in "[place]" and is financial vice president of [employer], and Southgate who lives in [place] and works as a Tree surgeon. The patient has 5 grandchildren. She is currently not a church attender   ADVANCED DIRECTIVES: Not in place; this was discussed 10/29/2012 and the patient was advised to complete a healthcare power of attorney document  HEALTH MAINTENANCE: Social History  Substance Use Topics  . Smoking status: Former Smoker    Packs/day: 1.00    Years: 20.00    Types: Cigarettes    Quit date: 04/02/1986  . Smokeless tobacco: Never Used  . Alcohol use No  Colonoscopy: 02/10/2015  PAP: 2012  Bone density:  Lipid panel:  No Known Allergies  Current Outpatient Prescriptions  Medication Sig Dispense Refill  . cetirizine (ZYRTEC) 10 MG tablet Take 10 mg by mouth daily.    Marland Kitchen aspirin EC 325 MG tablet Take 325 mg by mouth every morning.     . Calcium Carbonate-Vitamin D (CALTRATE 600+D PO) Take 600 mg by mouth 2 (two) times daily.    . clopidogrel (PLAVIX) 75 MG tablet TAKE ONE TABLET BY MOUTH ONCE DAILY 30 tablet 5  . CRESTOR 20 MG tablet TAKE ONE TABLET BY MOUTH ONCE DAILY 30 tablet 5  . lisinopril (PRINIVIL,ZESTRIL) 20 MG tablet TAKE ONE TABLET BY MOUTH ONCE DAILY 30 tablet 0  . metoprolol succinate (TOPROL-XL) 100 MG 24 hr tablet TAKE ONE TABLET BY MOUTH ONCE DAILY 30 tablet 5  . metroNIDAZOLE (METROCREAM) 0.75 % cream Apply 1 application topically 2 (two) times daily. For rosacea    . Multiple Vitamin (MULTIVITAMIN WITH MINERALS) TABS  tablet Take 1 tablet by mouth every morning. Centrum Silver    . SYNTHROID 112 MCG tablet TAKE ONE TABLET BY MOUTH ONCE DAILY BEFORE BREAKFAST 90 tablet 1  . Tetrahydrozoline HCl (VISINE OP) Place 1 drop into both eyes daily as needed (dry eyes).    . venlafaxine XR (EFFEXOR-XR) 75 MG 24 hr capsule TAKE ONE CAPSULE BY MOUTH ONCE DAILY 30 capsule 5   No current facility-administered medications for this visit.     OBJECTIVE: Middle-aged white woman In no acute distress Vitals:   03/28/16 1213  BP: (!) 131/58  Pulse: 93  Resp: 18  Temp: 97.8 F (36.6 C)     Body mass index is 24.43 kg/m.    ECOG FS: 0 Filed Weights   03/28/16 1213  Weight: 160 lb 11.2 oz (72.9 kg)   Sclerae unicteric, pupils round and equal Oropharynx clear and moist-- no thrush or other lesions No cervical or supraclavicular adenopathy Lungs no rales or rhonchi Heart regular rate and rhythm Abd soft, nontender, positive bowel sounds MSK no focal spinal tenderness, no upper extremity lymphedema Neuro: nonfocal, well oriented, appropriate affect Breasts: She status post right mastectomy, and there is no evidence of these recurrence. The right axilla is benign. Left breast is unremarkable.   LAB RESULTS: Lab Results  Component Value Date   WBC 6.2 03/21/2016   NEUTROABS 3.4 03/21/2016   HGB 13.5 03/21/2016   HCT 41.0 03/21/2016   MCV 88.7 03/21/2016   PLT 270 03/21/2016      Chemistry      Component Value Date/Time   NA 139 03/21/2016 1210   K 4.5 03/21/2016 1210   CL 105 10/22/2015 1029   CL 102 01/28/2013 0909   CO2 28 03/21/2016 1210   BUN 11.1 03/21/2016 1210   CREATININE 0.8 03/21/2016 1210      Component Value Date/Time   CALCIUM 10.1 03/21/2016 1210   ALKPHOS 54 03/21/2016 1210   AST 19 03/21/2016 1210   ALT 20 03/21/2016 1210   BILITOT 0.76 03/21/2016 1210       STUDIES: No results found.   ASSESSMENT: 75 y.o. [place] woman   (1)  status post right breast upper outer  quadrant and right axillary lymph node biopsies February 2014 of a clinical T2, pN1, stage IIB  invasive ductal carcinoma, grade 3, triple negative, with an MIB-1 of 15%  (2)  a third biopsy from a different quadrant 10/23/2012 showed ductal carcinoma in situ  (3)  treated in the neoadjuvant setting with 4 dose dense cycles of doxorubicin/ cyclophosphamide 12/23/2012, followed by 7 weekly doses of paclitaxel and completed 03/03/2013.  (4) status post right mastectomy and sentinel lymph node sampling 04/23/2013 for a residual ypT1b ypN1 invasive ductal carcinoma, grade 3, again triple negative [2/10 sample lymph nodes were involved by tumor, both with micrometastatic deposits]  (a) the patient opted against reconstruction  (5) adjuvant radiation completed 08/13/2013  (6) the patient underwent genetics counseling but declined testing   PLAN:  Kaitlan is now nearly 3 years out from definitive surgery of her breast cancer with no evidence of disease recurrence. This is very favorable, especially as triple negative breast cancer, if it is to come back, tends to recur early  She will see me again in one year. She knows to call for any problems that may develop before her next visit.Marland Kitchen   Chauncey Cruel, MD    03/28/2016

## 2016-04-05 ENCOUNTER — Other Ambulatory Visit: Payer: Self-pay | Admitting: Pulmonary Disease

## 2016-04-14 ENCOUNTER — Other Ambulatory Visit: Payer: Self-pay | Admitting: Pulmonary Disease

## 2016-04-18 ENCOUNTER — Other Ambulatory Visit: Payer: Self-pay | Admitting: Pulmonary Disease

## 2016-04-18 ENCOUNTER — Telehealth: Payer: Self-pay | Admitting: Pulmonary Disease

## 2016-04-18 NOTE — Telephone Encounter (Signed)
Refill of synthroid was sent in to her pharmacy and pt is aware. Nothing further is needed.

## 2016-04-24 ENCOUNTER — Other Ambulatory Visit: Payer: Self-pay | Admitting: Pulmonary Disease

## 2016-04-24 MED ORDER — CLOPIDOGREL BISULFATE 75 MG PO TABS
75.0000 mg | ORAL_TABLET | Freq: Every day | ORAL | 5 refills | Status: DC
Start: 2016-04-24 — End: 2016-10-31

## 2016-04-24 MED ORDER — SYNTHROID 112 MCG PO TABS: ORAL_TABLET | ORAL | 1 refills | Status: DC

## 2016-05-02 ENCOUNTER — Other Ambulatory Visit: Payer: Self-pay | Admitting: Oncology

## 2016-05-02 DIAGNOSIS — Z1231 Encounter for screening mammogram for malignant neoplasm of breast: Secondary | ICD-10-CM

## 2016-05-03 ENCOUNTER — Other Ambulatory Visit: Payer: Self-pay | Admitting: Pulmonary Disease

## 2016-05-10 DIAGNOSIS — L918 Other hypertrophic disorders of the skin: Secondary | ICD-10-CM | POA: Diagnosis not present

## 2016-05-10 DIAGNOSIS — Z1283 Encounter for screening for malignant neoplasm of skin: Secondary | ICD-10-CM | POA: Diagnosis not present

## 2016-05-10 DIAGNOSIS — Z872 Personal history of diseases of the skin and subcutaneous tissue: Secondary | ICD-10-CM | POA: Diagnosis not present

## 2016-05-10 DIAGNOSIS — D225 Melanocytic nevi of trunk: Secondary | ICD-10-CM | POA: Diagnosis not present

## 2016-05-10 DIAGNOSIS — L720 Epidermal cyst: Secondary | ICD-10-CM | POA: Diagnosis not present

## 2016-05-15 ENCOUNTER — Other Ambulatory Visit: Payer: Self-pay | Admitting: Pulmonary Disease

## 2016-05-16 ENCOUNTER — Ambulatory Visit
Admission: RE | Admit: 2016-05-16 | Discharge: 2016-05-16 | Disposition: A | Discharge status: Home / Self Care | Payer: Medicare Other | Source: Ambulatory Visit | Attending: Oncology | Admitting: Oncology

## 2016-05-16 DIAGNOSIS — Z1231 Encounter for screening mammogram for malignant neoplasm of breast: Secondary | ICD-10-CM | POA: Diagnosis not present

## 2016-06-27 ENCOUNTER — Ambulatory Visit: Payer: Medicare Other

## 2016-07-10 ENCOUNTER — Telehealth: Payer: Self-pay | Admitting: Pulmonary Disease

## 2016-07-10 NOTE — Telephone Encounter (Signed)
atc pt X3, rang to fast busy signal.  Wcb.

## 2016-07-11 MED ORDER — ROSUVASTATIN CALCIUM 20 MG PO TABS: 20.0000 mg | ORAL_TABLET | Freq: Every day | ORAL | 5 refills | Status: DC

## 2016-07-11 NOTE — Telephone Encounter (Signed)
Spoke with pt. States that some of her prescriptions will have to be changed. Reports that her insurance will no longer will cover some of the medications she is on. Insurance will no longer cover Effexor or brand name Crestor. Crestor will be changed to generic, this has already been sent in. Pt does not want Effexor changed to something else, she wants to taper off of this.  SN - please advise as how the pt should taper off of Effexor. Thanks.

## 2016-07-11 NOTE — Telephone Encounter (Signed)
Pt returning call.Debra Mcintyre ° °

## 2016-07-11 NOTE — Telephone Encounter (Signed)
Attempted to call pt. Received fast busy signal x2. Will try back. 

## 2016-07-12 ENCOUNTER — Ambulatory Visit (INDEPENDENT_AMBULATORY_CARE_PROVIDER_SITE_OTHER): Payer: Medicare Other

## 2016-07-12 DIAGNOSIS — Z23 Encounter for immunization: Secondary | ICD-10-CM

## 2016-07-12 NOTE — Telephone Encounter (Signed)
Spoke with the pt and notified of recs per SN  She verbalized understanding  She is aware rosuvastatin already sent  She is going to try and wean off of the venlafaxine  Nothing further needed

## 2016-07-12 NOTE — Telephone Encounter (Signed)
Per SN--  crestor 20 mg  Change to generic rosuvastatin   effexor xr 15 mg   1. Let her know that generic venlafaxine xr is available  2. If she wants to wean off then rec chang to every other day till gone then stop.  thanks

## 2016-07-13 DIAGNOSIS — Z23 Encounter for immunization: Secondary | ICD-10-CM

## 2016-07-29 ENCOUNTER — Other Ambulatory Visit: Payer: Self-pay | Admitting: Pulmonary Disease

## 2016-08-01 ENCOUNTER — Telehealth: Payer: Self-pay | Admitting: Pulmonary Disease

## 2016-08-01 MED ORDER — LISINOPRIL 20 MG PO TABS: 20.0000 mg | ORAL_TABLET | Freq: Every day | ORAL | 11 refills | Status: DC

## 2016-08-01 NOTE — Telephone Encounter (Signed)
lmtcb for pt.  

## 2016-08-01 NOTE — Telephone Encounter (Signed)
Called and spoke with pt and she stated that she will be having very minor surgery in the office at Dr. Lanny Cramp is wanting to know if she will need to stop the asprin 325 mg  And the plavix prior to this procedure.  SN please advise. Thanks  Lisinopril has been sent to the pharmacy.

## 2016-08-01 NOTE — Telephone Encounter (Signed)
Patient returned call, CB is [phone] or [phone].

## 2016-08-02 NOTE — Telephone Encounter (Signed)
Per SN---  This totally depends on the procedure that she will have.  This should be up to Dr. Sherian Maroon may want to check with him to see what he thinks----  Normally we stop the aspirin and plavix 5 days before a procedure---Dr. Harlow Mares has not notified Dr. [name] of the procedure.  thanks

## 2016-08-02 NOTE — Telephone Encounter (Signed)
Spoke with pt. She is aware of SN's recommendations. Nothing further was needed.

## 2016-08-15 ENCOUNTER — Telehealth: Payer: Self-pay | Admitting: Pulmonary Disease

## 2016-08-15 NOTE — Telephone Encounter (Signed)
Called and spoke with pt and she stated that since her insurance would not cover the effexor, she went off of this and has been very emotional since doing so.  Pt wanted to see if SN thought it would be ok for her to try sertraline.  Pt is aware that SN will be back on Monday.  SN please advise. Thanks  No Known Allergies

## 2016-08-21 MED ORDER — SERTRALINE HCL 100 MG PO TABS: 100.0000 mg | ORAL_TABLET | Freq: Every day | ORAL | 5 refills | Status: DC

## 2016-08-21 NOTE — Telephone Encounter (Signed)
SN please advise. Thanks.  

## 2016-08-21 NOTE — Telephone Encounter (Signed)
Per SN---  Ok for sertraline 100 1 daily  #30 with 5 refills. thanks

## 2016-08-21 NOTE — Telephone Encounter (Signed)
Spoke with pt. She is aware of SN's recommendation. Rx has been sent in. Nothing further was needed.

## 2016-09-06 MED ORDER — METOPROLOL SUCCINATE ER 100 MG PO TB24: 100.0000 mg | ORAL_TABLET | Freq: Every day | ORAL | 6 refills | Status: DC

## 2016-09-20 ENCOUNTER — Ambulatory Visit (INDEPENDENT_AMBULATORY_CARE_PROVIDER_SITE_OTHER)
Admission: RE | Admit: 2016-09-20 | Discharge: 2016-09-20 | Disposition: A | Discharge status: Home / Self Care | Payer: Medicare Other | Source: Ambulatory Visit | Attending: Pulmonary Disease | Admitting: Pulmonary Disease

## 2016-09-20 ENCOUNTER — Ambulatory Visit (INDEPENDENT_AMBULATORY_CARE_PROVIDER_SITE_OTHER): Payer: Medicare Other | Admitting: Pulmonary Disease

## 2016-09-20 ENCOUNTER — Encounter: Payer: Self-pay | Admitting: Pulmonary Disease

## 2016-09-20 VITALS — BP 110/68 | HR 87 | Temp 98.4°F | Ht 68.0 in | Wt 168.1 lb

## 2016-09-20 DIAGNOSIS — M150 Primary generalized (osteo)arthritis: Secondary | ICD-10-CM | POA: Diagnosis not present

## 2016-09-20 DIAGNOSIS — M810 Age-related osteoporosis without current pathological fracture: Secondary | ICD-10-CM

## 2016-09-20 DIAGNOSIS — I679 Cerebrovascular disease, unspecified: Secondary | ICD-10-CM | POA: Diagnosis not present

## 2016-09-20 DIAGNOSIS — I10 Essential (primary) hypertension: Secondary | ICD-10-CM | POA: Diagnosis not present

## 2016-09-20 DIAGNOSIS — E039 Hypothyroidism, unspecified: Secondary | ICD-10-CM

## 2016-09-20 DIAGNOSIS — D126 Benign neoplasm of colon, unspecified: Secondary | ICD-10-CM

## 2016-09-20 DIAGNOSIS — M159 Polyosteoarthritis, unspecified: Secondary | ICD-10-CM

## 2016-09-20 DIAGNOSIS — J4521 Mild intermittent asthma with (acute) exacerbation: Secondary | ICD-10-CM

## 2016-09-20 DIAGNOSIS — E7800 Pure hypercholesterolemia, unspecified: Secondary | ICD-10-CM

## 2016-09-20 DIAGNOSIS — K219 Gastro-esophageal reflux disease without esophagitis: Secondary | ICD-10-CM

## 2016-09-20 DIAGNOSIS — C50411 Malignant neoplasm of upper-outer quadrant of right female breast: Secondary | ICD-10-CM | POA: Diagnosis not present

## 2016-09-20 DIAGNOSIS — R05 Cough: Secondary | ICD-10-CM | POA: Diagnosis not present

## 2016-09-20 MED ORDER — LEVOFLOXACIN 500 MG PO TABS: 500.0000 mg | ORAL_TABLET | Freq: Every day | ORAL | 0 refills | Status: DC

## 2016-09-20 MED ORDER — METHYLPREDNISOLONE ACETATE 80 MG/ML IJ SUSP
80.0000 mg | Freq: Once | INTRAMUSCULAR | Status: AC
  Administered 2016-09-20: 80 mg via INTRAMUSCULAR

## 2016-09-20 MED ORDER — HYDROCODONE-HOMATROPINE 5-1.5 MG/5ML PO SYRP: 5.0000 mL | ORAL_SOLUTION | Freq: Four times a day (QID) | ORAL | 0 refills | Status: DC | PRN

## 2016-09-20 MED ORDER — PREDNISONE 20 MG PO TABS: ORAL_TABLET | ORAL | 0 refills | Status: DC

## 2016-09-20 NOTE — Progress Notes (Signed)
Subjective:    Patient ID: Debra Mcintyre, female    DOB: 09/28/1940, 76 y.o.   MRN: [id number]  HPI 76 y/o WF here for a follow up visit... she has multiple medical problems as noted below...  ~  SEE PREV EPIC NOTES FOR OLDER DATA>>   ~  March 2. 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 She developed back & hip pain while getting her XRT- eval by DrCaffrey w/ ?HNP, no sign of mets, given epid steroid shot which really helped...  She has some paresthesias & tried Neurontin, stopped this on her own...     HBP> on MetopER100, Lisin20; BP= 112/64 & she denies CP, palpit, SOB, edema...    Cerebrovasc Dis> on ASENI778 Plavix75; s/p aneurysm & TIA; stable w/o cerebral ischemic symptoms- she has not had f/u DrDeveshwar-IR- she notes she was told no need for f/u angiography (we might consider MRI later)...    Chol> on Cres20; FLP showed TChol 164, TG 116, HDL 49, LDL 92; rec to continue same med & diet efforts...    Hypothyroid> on Synthroid 1259md; TSH= 0.35 & she is clinically euthyroid, continue same...    Colon polyps> last colon was 11/10 w/ one adenomatous polyp removed, f/u planned 76yr82yr  Breast cancer> followed by DrMagrinat, DrWakefield, DrMurray w/ prev surg, chemoRx and XRT- notes reviewed...    DJD, FM, Osteop> on Calcium, MVI, OTC analgesics; BMD followed by her GYN...    Anxiety> on EffexorXR75 and she reports stable on this med...  We reviewed prob list, meds, xrays and labs> see below for updates >>   CXR 3/15 showed borderline heart size, left sided port, new RUL airsp dis (could be fibrosis from XRT), scoliosis & lower Tspine compression...  CT Chest 3/15 showed subpleural airspace  consolidation and ground-glass in the anterior aspect of the right upper and right middle lobes indicative of radiation therapy; post op changes in right breast & axila; incidental 8mm 4mule in RMLunchanged from 2013, no adenopathy, atherosclerotic calcif noted...   LABS 3/15:  FLP- at goals on Cres20;  Chems- wnl;  CBC- wnl w/ Hg=12.6;  TSH=0.35 on Synthroid125...   ~  April 16, 2014:  60mo R74mo Jaasia reports a good interval- she informs me that she has decided against reconstruction & is very comfortable w/ her decision;  She notes some fall allergies & is using Zyrtek prn;  She gets exercise by walking the dog & will join a Y program soon;  She has had f/u visits w/ DrSanger, DrWakefield, & DrMagrinat in the interval- stable & doing satis, f/u left mammogram 6/15 (she is s/p right mastectomy) and it was neg... We reviewed the following medical problems during today's office visit >>     AbnCXR> CXR 3/15 showed new RUL airspace dis & CTChest 3/15 revealed GG opacities prob related to radiation therapy; incidental 8mm no69me in RML w/o ch from 2013, atherosclerotic calcif, no adenopathy; due for f/u film today=> improved...    HBP> on MetopER100, Lisin20; BP= 122/68 & she denies CP, palpit, SOB, edema; she wants to decr the MeptopER to '50mg'$ /d- ok & watch BP at home...    Cerebrovasc Dis> on ASA325 & Plavix75;  s/p aneurysm & TIA; stable w/o cerebral ischemic symptoms- she has not had f/u DrDeveshwar,IR- she notes she was told no need for f/u angiography (we might consider MRI later)...    Chol> on Cres20; FLP 3/15 showed TChol 164, TG 116, HDL 49, LDL 92; rec to continue same med & diet efforts...    Hypothyroid> on Synthroid 171mcg/d; labs 3/15 showed TSH= 0.35 & she is clinically euthyroid, continue same...    Colon polyps> last colon was 11/10 w/ one adenomatous polyp removed, f/u planned 76yrs.    Breast cancer> followed by DrMagrinat, DrWakefield, DrMurray w/ prev surg, chemoRx and XRT- notes  reviewed; she has decided against reconstruction...    DJD, FM, Osteop> on Calcium, MVI, OTC analgesics; BMD followed by her GYN...    Anxiety> on EffexorXR75 and she reports stable on this med...  We reviewed prob list, meds, xrays and labs> see below for updates >>   CXR 9/15 showed no infiltrate, opacity, etc; scoliosis & stable compression deformity; port has been removed...  ~  October 13, 2014:  74mo ROV & Neely reports feeling well, no new commplaints or concerns; she notes that her chest is ok- no CP, palpit, SOB, and she denies cough/ sput/ hemoptysis; her weight is 156# w/ BMI=24... We reviewed the following medical problems during today's office visit >>     AbnCXR> CXR 3/15 w/ new RUL airspace dis & CTChest 3/15 revealed GG opacities prob related to radiation therapy; incidental 5mm nodule in RML w/o ch from 2013, atherosclerotic calcif, no adenopathy; f/u film 9/15=> improved.    HBP> on MetopER100, Lisin20; BP= 112/64 & she denies CP, palpit, SOB, edema...    Cerebrovasc Dis> on OAC166 & Plavix75; s/p aneurysm & TIA; stable w/o cerebral ischemic symptoms- she has not had f/u DrDeveshwar,IR- she notes she was told no need for f/u angiography (we might consider MRI later)...    Chol> on Cres20; FLP 3/16 showed TChol 174, TG 200, HDL 45, LDL 89; rec to continue same med & diet efforts...    Hypothyroid> on Synthroid 198mcg/d; labs 3/16 showed TSH= 0.11 & she is clinically euthyroid, rec to step down her Syntroid dose to 13mcg/d...    Colon polyps> last colon was 11/10 w/ one adenomatous polyp removed, f/u planned by DrDBrodie 3/16- pending...    Breast cancer> followed by DrMagrinat Q38mo, DrWakefield, DrMurray w/ prev surg, chemoRx (triple neg tumor) and XRT- notes reviewed; she has decided against reconstruction...    DJD, FM, Osteop> on Calcium, MVI, OTC analgesics; BMD followed by her GYN...    Anxiety> on EffexorXR75 and she reports stable on this med...  We reviewed prob list, meds,  xrays and labs> see below for updates >>   LABS 3/16:  FLP- ok on Cres20 x TG=200;  Chems- wnl;  CBC- wnl;  TSH=0.11 on Synthroid125... PLAN>> Calvary is stable & in good spirits; she is asked to follow a low chol/ low fat diet & work on wt reduction; her TSH is oversuppressed on Synthroid125 & we will step her down to 131mcg/d...   ~  April 20, 2015:  70mo ROV & Claritza reports doing well, feeling well, no new complaints or concerns;  She continues to f/u w/ DrMagrinat & DrWakefield yearly (alternating Q48mo)- she saw DrMagrinat 8/16 & stable, no evid dis recurrence... We reviewed the following medical problems during today's office visit >>     AbnCXR> CXR 3/15 w/ new RUL airspace dis & CTChest 3/15 revealed GG opacities prob related  to radiation therapy; incidental 81mm nodule in RML w/o ch from 2013, atherosclerotic calcif, no adenopathy; f/u film 9/15=> improved, and serial f/u film 9/16=> clear, wnl...    HBP> on MetopER100, Lisin20; BP= 124/76 & she denies CP, palpit, SOB, edema; we discussed increasing her physical activity...    Cerebrovasc Dis> on ASA325 & Plavix75; Hx cerebrovasc dis, tiny aneurysm & TIA; stable w/o cerebral ischemic symptoms- 9/16 she mentioned that she'd like to have f/u MRI/MRA to recheck vasc status and the sm MCA aneurysm...    Chol> on Cres20; FLP 3/16 showed TChol 174, TG 200, HDL 45, LDL 89; rec to continue same med & diet efforts...    Hypothyroid> on Synthroid 146mcg/d; labs 3/16 showed TSH= 0.11 & she is clinically euthyroid, rec to step down her Syntroid dose to 155mcg/d...    Colon polyps> she had f/u colonoscopy 6/16 by DrDBrodie- 53mm sessile polyp removed from the desc colon=> tubular adenoma & they rec f/u colon in 5 yrs...    Breast cancer> Hx right breast cancer Dx 2/14 w/ ChemoRx, Surg, XRT; she continues f/u w/ DrMagrinat & DrWakefield Q64mo; last seen 8/16 & stable- no known recurrence, f/u mammogram left breast 8/16 remains neg...     DJD, FM, Osteop> on  Calcium, MVI, OTC analgesics; [facility] w/ lowest Tscore-1.5 in distal left radius; encouraged to continue supplements and wt bearing exercise...    Anxiety> on EffexorXR75 and she reports stable on this med...  EXAM showed Afeb, VSS, O2sat=96%;  HEENT- neg, mallampati2;  Chest- clear w/o w/r/r;  Heart- RR w/o m/r/g;  Abd- soft, neg;  Ext- w/o c/c/e;  Neuro- intact w/o focal neuro deficits...   CXR 9/16 showed norm heart size, clear lungs, +scoliosis, lower thoracic compression deformity is stable, NAD.Marland KitchenMarland Kitchen  EKG 04/2015 showed NSR, rate65, IRBBB, otherw norm EKG, NAD...  LABS 04/2015:  Chems- wnl;  CBC- wnl  MRI / MRA Brain => pending IMP/PLAN>>  Tunya continues to do well- no new complaints or concerns, but she is worried about the fact that she hasn't has a f/u MRI/MRA in 14yrs; she continues stable w/o cerebral ischemic symptoms on ASA /Plavix; we will order the scan for follow up;  She needs to increase her exercise program & will consider [employer] [facility] the [employer] at the cancer center classes...  ADDENDUM>>  MRI done 05/04/15- for some reason MRA was not done;  No infarct or hemorrhage, no intracranial mass or abn enhancement; mod small vessel dis & mild global atrophy; she needs MRA to assess the 1.5-81mm right MCA bifuraction aneurysm-- I discussed w/ pt & she decided to forego the MRA at this time since she remains asymptomatic...  ~  October 18, 2015:  20mo ROV & Daniele reports that she is stable, good 50mo interval, no new complaints or concerns;  She had left cat surg 2 wks ago by DrShapiro & planning right cat surg soon;  She notes that her neuropathy symptoms in feet>hands and she upped her Gabapentin to 300mg  Bid (helping);  Epic reveals that she had a scare 06/2015 w/ lump felt in right mastectomy scar & 72mm hypoechoic nodule on ultrasound; DrWakefield rec Bx & this was done 07/12/15 by radiology=> Path neg inflammed fibroadipose tissue c/w scar, no malignancy... We reviewed the  following medical problems during today's office visit >>     S/p cat surg> as noted, DrShapiro    AbnCXR> CXR 3/15 w/ new RUL airspace dis & CTChest 3/15  revealed GG opacities prob related to radiation therapy; incidental 63m nodule in RML w/o ch from 2013, atherosclerotic calcif, no adenopathy; f/u film 9/15=> improved, and serial f/u film 9/16=> clear, wnl...    HBP> on MetopER100, Lisin20; BP= 124/70 & she denies CP, palpit, SOB, edema; we discussed increasing her physical activity...    Cerebrovasc Dis> on ASA325 & Plavix75; Hx cerebrovasc dis, tiny aneurysm & TIA; stable w/o cerebral ischemic symptoms- 9/16 she mentioned that she'd like to have f/u MRI/MRA to recheck vasc status and the sm MCA aneurysm=> mod small vessel dis, no evid intracranial mets, right MCA bifurcation aneurysm ~241msize needs MRA or CTA to properly assess but she decided to hold off on additional scans for now...    Chol> on Cres20; FLP 3/17 showed TChol 195, TG 184, HDL 49, LDL 110; rec to continue same med & diet efforts...    Hypothyroid> on Synthroid 11211md; labs 3/17 showed TSH= 2.23 & she is clinically euthyroid- continue same    Colon polyps> she had f/u colonoscopy 6/16 by DrDBrodie- 3mm9mssile polyp removed from the desc colon=> tubular adenoma & they rec f/u colon in 5 yrs...    Breast cancer> Hx right breast cancer Dx 2/14 w/ ChemoRx, Surg, XRT; she continues f/u w/ DrMagrinat & DrWakefield Q27mo;29mot seen 8/16 & stable- no known recurrence, she had ultrasound bx 11/16 of right chest wall- NEG    DJD, FM, Osteop> on Calcium, MVI, OTC analgesics; [facility] distal left radius; encouraged to continue supplements and wt bearing exercise...    Neuro w/ mod small vessel dis & global atrophy on MRI + Neuropathy on Gabapentin '300mg'$  Bid    Anxiety> on EffexorXR75 and she reports stable on this med...  EXAM showed Afeb, VSS, O2sat=96%;  HEENT- neg, mallampati2;  Chest- clear w/o w/r/r;   Heart- RR w/o m/r/g;  Abd- soft, neg;  Ext- w/o c/c/e;  Neuro- intact w/o focal neuro deficits...   LABS 10/22/15>  FLP- ok on Cres20 w/ LDL=110;  Chems- wnl x BS=106;  CBC- wnl;  TSH=2.23;  VitD=47... IMP/PLAN>>  JeannMarvettabeen stable & the right breast scare from 11/16 turned out to be inflammed adipose tissue/ scar;  Labs are OK & she continues to work on diet/ exercise, continue same meds; we discussed ROV recheck in 27mo..63mo ~  September 20, 2016:  30mo R1moadd-on appt requested due to cough, yellow sput, low grade fever along w/ fatigue & body aches;  URI=> ILI w/ assoc nausea, no vomiting, some abd discomfort & sl loose stool;  We discussed Rx w/ Depo80, Pred taper, Levaquin, Mucinex, & Hycodan, plus align, fluids, etc...     HxAbnCXR> CXR 3/15 w/ new RUL airspace dis & CTChest 3/15 revealed GG opacities prob related to radiation therapy; incidental 8mm nod29m in RML w/o ch from 2013, atherosclerotic calcif, no adenopathy; f/u film 9/15=> improved, and serial f/u film 9/16=> clear, wnl...    EXAM showed Afeb, VSS, O2sat=97% on RA;  HEENT- neg, mallampati2;  Chest- clear x few scat rhonchi w/o consolid/ wheezing;  Heart- RR w/o m/r/g;  Abd- soft, neg;  Ext- w/o c/c/e;  Neuro- intact w/o focal neuro deficits...   CXR 09/20/16 (independently reviewed by me in the PACS system) showed norm heart size, Ao atherosclerosis, clear lungs w/ mild left base atx, scoliosis of spine & osteopenia; Note- mild chr compression of lower Tspine vertebra... IMP/PLAN>>  Goldia hLibra  upper resp infection/ ILI/ AB exac=> discussed treatment w/ Depo80, Pred taper (see AVS), Levaquin500/d, Mucinex600Qid w/ fluids, & Hycodan prn cough...           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 Marland Kitchen.. ~  Followed by DrMagrinat, DrWakefield, DrMurray w/ prev surg, chemoRx and XRT => SUMMARY:    1) status post right breast and right axillary lymph node biopsies February 2014 of a clinical T2, pN1, stage IIB invasive ductal carcinoma, grade 3, triple negative, with an MIB-1 of 15%.    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    4)status post right mastectomy and sentinel lymph node sampling 04/23/2013 for a residual pT1b pN1 invasive ductal carcinoma, grade 3, again triple negative [2/10 sample lymph nodes were involved by tumor, both with micrometastatic deposits -- NOTE: she opted against reconstruction.    5) adjuvant radiation completed 08/13/2013    6) the patient underwent genetics counseling but declined testing. ~  2015: She reports that 10 y/o sister was Dx w/ breast cancer this yr...  ABNORMAL CXR >>  ~  3/15:  CXR showed new RUL airspace dis & CTChest 3/15 revealed GG opacities prob related to radiation therapy; incidental 73m nodule in RML w/o ch from 2013, atherosclerotic calcif, no adenopathy... ~  9/15:  CXR 9/15 showed no infiltrate, opacity, etc; scoliosis & stable compression deformity; port has been removed... ~  9/16:  CXR 9/16 showed norm heart size, clear lungs, +scoliosis, lower thoracic compression deformity is stable, NAD...  HYPERTENSION (ICD-401.9) > ~  on TOPROL XL '100mg'$ /d,  LISINOPRIL/ Hct 20/12.5 daily; BP well controlled on these meds; denies HA, fatigue, visual changes, CP, palipit, dizziness, syncope, dyspnea, edema, etc; she does water aerobics & walks for exercise...  ~  CXR 8/12 showed normal heart size, clear lungs, scoliosis, NAD... ~  10/12:   Presents w/ chest discomfort> EKG showed NSR, rate 69, rsr' in V1-2, otherw wnl; referred to Cards=> she requests DrKelly. ~  11/13:  BP= 128/82 & she remains asymptomatic... ~  7/14: on MetopER100 & LisinHCT20-12.5; BP= 128/72 but has been volatile at home & in Oncology office prompting DrMagrinat to change to Lisin10 & stop the Hct in light of her 103 wt loss...  ~  3/15: on MetopER100, Lisin20; BP= 112/64 & she denies CP, palpit, SOB, edema ~  9/15: on MetopER100, Lisin20; BP= 122/68 & she denies CP, palpit, SOB, edema; she wants to decr the MeptopER to '50mg'$ /d => but she never did... ~  3/16: on MetopER100, Lisin20; BP= 112/64 & she denies CP, palpit, SOB, edema ~  9/16: on MetopER100, Lisin20; BP= 124/76 & she remains asymptomatic; we discussed increasing her physical activity...  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 she's been stable since that time w/ out pt f/u by  IR, [name]> she indicates he said no further studies needed. ~  CDopplers 5/04 showed mild plaque, no signif ICA stenoses... ~  MR studies 10/09 showed sm vessel dis; poss restenosis left MCA w/ angiogram rec- but wasn't done; mod stenosis at origin of left vertebral art w/ tortuosity... ~  Carotid arteriogram 12/10 showed stable mild residual left middle cerebral art stenosis at site of prev angioplasty, and stable 1.5 to 64m saccular right middle cerebral art aneurysm... ~  10/12:  she remains asymptomatic- w/o cerebral ischemic symptoms... ~  10/13:  she continues stable on ASA/ Plavix w/o cerebral ischemic symptoms... ~  3/15 & 9/15: on ASA325 & Plavix75; s/p aneurysm & TIA; stable w/o cerebral ischemic symptoms- she has not had f/u DrDeveshwar-IR- she notes she was told no need for f/u angiography (we  might consider MRI later) ~  3/16: on ASheridan she denies cerebral ischemic symptoms & is stable... ~  9/16: stable on ASA/ Plavix and we decided to repeat her MRI/ MRA => pending  HYPERCHOLESTEROLEMIA (ICD-272.0) >>  ~  FLP 3/08 shows Tchol 180, TG 206, HDL 49, LDL 105... rec same meds, better diet, get wt down. ~  FDe Witt10/09 on Lip80 showed TChol 150, TG 178, HDL 41, LDL 74 ~  FLP 1/11 on Lip80+Zetia10 showed TChol 162, TG 226, HDL 47, LDL 95... she wants to stop Zetia, get on diet & get wt down. ~  FLP 1/12 on Lip80 showed TChol 173, TG 164, HDL 45, LDL 95 ~  FLP 10/13 on Lip80 intermittently showed TChol 189, TG 231, HDL 45, LDL 112... She was c/o paresthesias that she was convinced was due to the Lip80 & she requested change to CRESTOR '20mg'$ /d... ~  FElmwood4/14 on Cres20 showed TChol 167, TG 141, HDL 46, LDL 93 ~  FLP 3/15 on Cres20 showed TChol 164, TG 116, HDL 49, LDL 92 ~  FLP 3/16 on Cres20 showed TChol 174, TG 200, HDL 45, LDL 89; we reviewed diet, exercise, wt reduction...  HYPOTHYROIDISM (ICD-244.9) - on SYNTHROID 1275m/d... ~  labs 3/08 showed TSH = 0.23 ~  labs 10/09 showed TSH= 0.64 ~  labs 1/11 showed TSH= 0.18... she wants to keep same dose to aide wt reduction. ~  labs 1/12 showed TSH= 0.20... Ditto ~  Labs 10/13 on Levo125 showed TSH= 1.20 ~  Labs 3/15 on Synthroid125 showed TSH= 0.35 ~  Labs 3/16 on Synthroid125 showed TSH= 0.11; we decided to step down her Synthroid dose to 11272md...  COLONIC POLYPS (ICD-211.3), & Hx of HEMORRHOIDS (ICD-455.6) ~  colonoscopy 10/00 by DrDBrodie showed hems only... f/u planned 12y79yr  f/u colonoscopy 11/10 showed 2 polyps- one adenomatous w/ f/u planned 26yrs76yr she had f/u colonoscopy 6/16 by DrDBrodie- 3mm s83mile polyp removed from the desc colon=> tubular adenoma & they rec f/u colon in 5 yrs...  DEGENERATIVE JOINT DISEASE (ICD-715.90) - uses Tylenol & OTC meds as needed... [facility]  hemangioma found in right shoulder area... [facility]- no [facility] necessary... ~  1/11:  notes some pain in hands & wrist... ~  1/12:  Stable w/o acute complaints or problem areas... ~  2014: she developed LBP & hip pain while receiving XRT for her breast cancer; eval by DrMagrinat & DrCaffrey w/ epid steroid shot that really helped... ~  She has a scoliosis and compression deformity in lower TSpine...  Hx of FIBROMYALGIA (ICD-729.1)  OSTEOPOROSIS (ICD-733.00) - she indicates that this is followed & managed by GYN, DrCousins> ?when last BMD was done "she keeps up w/ this" > on Calcium, & Vitamins... ~  labs 1/11 showed Vit D level = 54... ~  Pt again reiterates that GYN follows her BMDs & is up to date... ~  BMD done 3/16 at [facility] w/ lowest Tscore-1.5 in distal left radius; encouraged to continue supplements and wt bearing exercise...  TIA (ICD-435.9) - as above, she remains on ASA325 & PLAVIX75... last saw DrReynolds in 2006... 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 arteriogram by Dr. Fleeta Emmer confirmed a web-like plaque in the left M1 segment of the left MCA with signif stenosis...  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 subseq PTA of left MCA w/ good result- resid 20% stenosis seen on f/u angiograms along w/ a 43m saccular aneurysm seen in the right MCA trifurcation...  ANXIETY (ICD-300.00) - on EFFEXOR '75mg'$ /d for hot flashes, she says.... she wishes to continue the med "it keeps me on an even keel".  Health Maintenance - GYN= DrCousins & she will call for f/u... [facility] DrCousins... ~  Immunizations: she refuses Flu vaccine... given PNEUMOVAX- 1/11, and Tdap- 1/11...   Past Surgical History:  Procedure Laterality Date  . BREAST BIOPSY Right 10/23/2012  . BREAST BIOPSY Right 10/08/2012  .  COLONOSCOPY    . COLONOSCOPY W/ POLYPECTOMY    . ESOPHAGOGASTRODUODENOSCOPY    . Left middle cerebral artery angioplasty  2004   by [name] had 2 follow up occurances  . MASTECTOMY W/ SENTINEL NODE BIOPSY Right 04/23/2013   Procedure: RIGHT TOTAL MASTECTOMY WITH SENTINEL LYMPH NODE BIOPSY;  Surgeon: MRolm Bookbinder MD;  [facility]  Service: General;  Laterality: Right;  . PORTACATH PLACEMENT N/A 11/05/2012   Procedure: INSERTION PORT-A-CATH;  Surgeon: MRolm Bookbinder MD;  [place] [facility]  Service: General;  Laterality: N/A;  . RADIOLOGY WITH ANESTHESIA N/A 05/04/2015   Procedure: MRI BRAIN WITH AND WITHOUT;  Surgeon: Medication Radiologist, MD;  [place] [facility]  Service: Radiology;  Laterality: N/A;    Outpatient Encounter Prescriptions as of 09/20/2016  Medication Sig Dispense Refill  . aspirin EC 325 MG tablet Take 325 mg by mouth every morning.     . Calcium Carbonate-Vitamin D (CALTRATE 600+D PO) Take 600 mg by mouth 2 (two) times daily.    . clopidogrel (PLAVIX) 75 MG tablet Take 1 tablet (75 mg total) by mouth daily. 30 tablet 5  . lisinopril (PRINIVIL,ZESTRIL) 20 MG tablet Take 1 tablet (20 mg total) by mouth daily. 30 tablet 11  . metoprolol succinate (TOPROL-XL) 100 MG 24 hr tablet Take 1 tablet (100 mg total) by mouth daily. Take with or immediately following a meal. 30 tablet 6  . metroNIDAZOLE (METROCREAM) 0.75 % cream Apply 1 application topically 2 (two) times daily. For rosacea    . Multiple Vitamin (MULTIVITAMIN WITH MINERALS) TABS tablet Take 1 tablet by mouth every morning. Centrum Silver    . rosuvastatin (CRESTOR) 20 MG tablet Take 1 tablet (20 mg total) by mouth daily. 30 tablet 5  . sertraline (ZOLOFT) 50 MG tablet Take 50 mg by mouth daily.    .Marland KitchenSYNTHROID 112 MCG tablet TAKE ONE TABLET BY MOUTH ONCE DAILY BEFORE  BREAKFAST 90 tablet 1  . cetirizine (ZYRTEC) 10 MG tablet Take  10 mg by mouth daily.    Marland Kitchen HYDROcodone-homatropine (HYCODAN)  5-1.5 MG/5ML syrup Take 5 mLs by mouth every 6 (six) hours as needed for cough. 240 mL 0  . levofloxacin (LEVAQUIN) 500 MG tablet Take 1 tablet (500 mg total) by mouth daily. 7 tablet 0  . predniSONE (DELTASONE) 20 MG tablet Take as directed 16 tablet 0  . sertraline (ZOLOFT) 100 MG tablet Take 1 tablet (100 mg total) by mouth daily. (Patient not taking: Reported on 09/20/2016) 30 tablet 5  . Tetrahydrozoline HCl (VISINE OP) Place 1 drop into both eyes daily as needed (dry eyes).    . venlafaxine XR (EFFEXOR-XR) 75 MG 24 hr capsule TAKE ONE CAPSULE BY MOUTH ONCE DAILY (Patient not taking: Reported on 09/20/2016) 30 capsule 5  . [EXPIRED] methylPREDNISolone acetate (DEPO-MEDROL) injection 80 mg      No facility-administered encounter medications on file as of 09/20/2016.     No Known Allergies   Current Medications, Allergies, Past Medical History, Past Surgical History, Family History, and Social History were reviewed in [employer] record.    Review of Systems    Constitutional:  Denies F/C/S, appetite is good, weight is stable ~150# HEENT:  No HA, visual changes, earache, nasal symptoms, sore throat, hoarseness. Resp:  No cough, sputum, hemoptysis; +SOB/DOE w/o tightness or wheezing. Cardio:  No CP, palpit, orthopnea, edema; notes some DOE GI:  Denies N/V/D/C or blood in stool; no reflux, abd pain, distention, or gas. GU:  No dysuria, freq, urgency, hematuria, or flank pain. MS:  Denies joint pain, swelling, tenderness; no neck pain, back pain, etc. Neuro:  No tremors, seizures, dizziness, syncope; prev weakness & numbness improved, gait is ok... Skin:  No suspicious lesions or skin rash. Heme:  No bruising, bleeding, etc; axillary adenopathy is resolved Psyche: Denies confusion, sleep disturbance, hallucinations, anxiety, depression.   Objective:   Physical Exam      WD, WN, 76 y/o WF in NAD... GENERAL:  Alert & oriented; pleasant & cooperative... HEENT:   [place]/AT, EOM-wnl, PERRLA, EACs-clear, TMs-wnl, NOSE-clear, THROAT-clear & wnl. NECK:  Supple w/ fairROM; no JVD; normal carotid impulses w/o bruits; no thyromegaly or nodules palpated; no lymphadenopathy. CHEST:  Clear to P&A w/o wheezing, rales, or rhonchi detected... HEART:  Regular Rhythm; without murmurs/ rubs/ or gallops. ABDOMEN:  Soft & nontender; normal bowel sounds; no organomegaly or masses palpated. EXT: without deformities, mild arthritic changes; no varicose veins/ +venous insuffic/ no edema. NEURO:  CN's intact;  no focal neuro deficits... DERM:  No lesions noted; no rash etc...  RADIOLOGY DATA:  Reviewed in the EPIC EMR & discussed w/ the patient...  LABORATORY DATA:  Reviewed in the EPIC EMR & discussed w/ the patient...   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 She continues f/u w/ DrMagrinat & DrWakefield yearly...   ASTHMATIC BRONCHITIS>  No recent URIs or resp exac & she is once again not on regular meds, denies breathing problems, etc... Abn CXR>  Faint GG opacity in RUL has resolved (likely related to XRT); she is at baseline, stable... 09/20/16>   Talar has an upper resp infection/ ILI/ AB exac=> discussed treatment w/ Depo80, Pred taper (see AVS), Levaquin500/d, Mucinex600Qid w/ fluids, & Hycodan prn cough   HBP>  Controlled on BBlocker, & ACE, she continues to watch BP at home...  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 We will f/u MRI / MRA 9/16 => pending  CHOL>  Controlled on Cres20 (she was intol to Lip80), needs better low fat diet...  HPYOTHYROID>  On Synthroid 1255m/d w/ TSH = 0/11 so we will step down her dose to  1123md...  Other medical problems as noted... 10/18/15>   JeaLadeanas been stable & the right breast scare from 11/16 turned out to be inflammed adipose tissue/ scar;  Labs are OK & she continues to work on diet/ exercise, continue same meds; we discussed ROV recheck in [id number]'s Medications  New Prescriptions   HYDROCODONE-HOMATROPINE (HYCODAN) 5-1.5 MG/5ML SYRUP    Take 5 mLs by mouth every 6 (six) hours as needed for cough.   LEVOFLOXACIN (LEVAQUIN) 500 MG TABLET    Take 1 tablet (500 mg total) by mouth daily.   PREDNISONE (DELTASONE) 20 MG TABLET    Take as directed  Previous Medications   ASPIRIN EC 325 MG TABLET    Take 325 mg by mouth every morning.    CALCIUM CARBONATE-VITAMIN D (CALTRATE 600+D PO)    Take 600 mg by mouth 2 (two) times daily.   CETIRIZINE (ZYRTEC) 10 MG TABLET    Take 10 mg by mouth daily.   CLOPIDOGREL (PLAVIX) 75 MG TABLET    Take 1 tablet (75 mg total) by mouth daily.   LISINOPRIL (PRINIVIL,ZESTRIL) 20 MG TABLET    Take 1 tablet (20 mg total) by mouth daily.   METOPROLOL SUCCINATE (TOPROL-XL) 100 MG 24 HR TABLET    Take 1 tablet (100 mg total) by mouth daily. Take with or immediately following a meal.   METRONIDAZOLE (METROCREAM) 0.75 % CREAM    Apply 1 application topically 2 (two) times daily. For rosacea   MULTIPLE VITAMIN (MULTIVITAMIN WITH MINERALS) TABS TABLET    Take 1 tablet by mouth every morning. Centrum Silver   ROSUVASTATIN (CRESTOR) 20 MG TABLET    Take 1 tablet (20 mg total) by mouth daily.   SERTRALINE (ZOLOFT) 100 MG TABLET    Take 1 tablet (100 mg total) by mouth daily.   SERTRALINE (ZOLOFT) 50 MG TABLET    Take 50 mg by mouth daily.   SYNTHROID 112 MCG TABLET    TAKE ONE TABLET BY MOUTH ONCE DAILY BEFORE  BREAKFAST   TETRAHYDROZOLINE HCL (VISINE OP)    Place 1 drop into both eyes daily as needed (dry eyes).   VENLAFAXINE XR (EFFEXOR-XR) 75 MG 24 HR CAPSULE    TAKE ONE CAPSULE BY MOUTH ONCE DAILY  Modified Medications   No medications on  file  Discontinued Medications   No medications on file

## 2016-09-20 NOTE — Patient Instructions (Addendum)
Today we updated your med list in our EPIC system...    Continue your current medications the same...  Today we did a CXR...    We will contact you w/ the results when available...   For the bronchial infection & inflammation>.    We gave you a Depo shot today...    Starting in the AM 2/8> take the PREDNISONE '20mg'$  tabs starting w/ one tab twice daily for 4 days...       Then decrease to 1 tab each AM for 4 days...       Then decrease to 1/2 tab daily each AM for 4 days...       Then decrease to 1/2 tab every other day til gone (1/2, 0, 1/2, 0, etc)...  Take the [facility] antibiotic one tab daily til gone...    Be sure to take a PROBIOTIC while you are on the Levaquin- eg. ALIGN one daily (avail OTC) & consider Activia yogurt  Add-in the OTC MUCINEX '600mg'$  tabs one tab 4 times daily w/ lots of fluids...  We wrote for a cough syrup- HYCODAN one tsp every 6H as needed for the coough  Take TYLENOL for the aches, pains, etc & get plenty of rest...  Call for any questions or if we can be of servise in any way.Marland KitchenMarland Kitchen

## 2016-09-25 NOTE — Progress Notes (Signed)
Called patient and husband, Debra Mcintyre, stated she was unavailable at this time. Will call back at later time.

## 2016-10-02 ENCOUNTER — Other Ambulatory Visit: Payer: Self-pay

## 2016-10-02 MED ORDER — METOPROLOL SUCCINATE ER 100 MG PO TB24: 100.0000 mg | ORAL_TABLET | Freq: Every day | ORAL | 6 refills | Status: DC

## 2016-10-11 ENCOUNTER — Telehealth: Payer: Self-pay | Admitting: Pulmonary Disease

## 2016-10-11 MED ORDER — SYNTHROID 112 MCG PO TABS: ORAL_TABLET | ORAL | 1 refills | Status: DC

## 2016-10-11 NOTE — Telephone Encounter (Signed)
Spoke with pt, requesting refill on synthroid.  This has been sent to preferred pharmacy.  Nothing further needed.

## 2016-10-31 ENCOUNTER — Telehealth: Payer: Self-pay | Admitting: Pulmonary Disease

## 2016-10-31 MED ORDER — CLOPIDOGREL BISULFATE 75 MG PO TABS: 75.0000 mg | ORAL_TABLET | Freq: Every day | ORAL | 5 refills | Status: DC

## 2016-10-31 NOTE — Telephone Encounter (Signed)
Spoke with pt, requesting refill on generic plavix.  rx sent to preferred pharmacy.  Nothing further needed.

## 2017-01-07 ENCOUNTER — Other Ambulatory Visit: Payer: Self-pay | Admitting: Pulmonary Disease

## 2017-01-09 ENCOUNTER — Telehealth: Payer: Self-pay | Admitting: Pulmonary Disease

## 2017-01-09 MED ORDER — LEVOTHYROXINE SODIUM 112 MCG PO TABS: 112.0000 ug | ORAL_TABLET | Freq: Every day | ORAL | 4 refills | Status: DC

## 2017-01-09 NOTE — Telephone Encounter (Signed)
Patient is returning call, CB is [phone].

## 2017-01-09 NOTE — Telephone Encounter (Signed)
lmtcb X1 for pt  

## 2017-01-09 NOTE — Telephone Encounter (Signed)
Spoke with patient regarding Synthroid medication. She requests a generic to be called into the [facility] on [address]. Verified dosage and instructions with patient.   Medication has been sent to pharmacy. Nothing else needed at time of call.

## 2017-03-21 ENCOUNTER — Other Ambulatory Visit (HOSPITAL_BASED_OUTPATIENT_CLINIC_OR_DEPARTMENT_OTHER): Payer: Medicare Other

## 2017-03-21 DIAGNOSIS — C50411 Malignant neoplasm of upper-outer quadrant of right female breast: Secondary | ICD-10-CM | POA: Diagnosis present

## 2017-03-21 LAB — CBC WITH DIFFERENTIAL/PLATELET
BASO%: 0.8 % (ref 0.0–2.0)
BASOS ABS: 0 10*3/uL (ref 0.0–0.1)
EOS%: 2.7 % (ref 0.0–7.0)
Eosinophils Absolute: 0.1 10*3/uL (ref 0.0–0.5)
HEMATOCRIT: 39.8 % (ref 34.8–46.6)
HEMOGLOBIN: 12.9 g/dL (ref 11.6–15.9)
LYMPH#: 1.4 10*3/uL (ref 0.9–3.3)
LYMPH%: 26.5 % (ref 14.0–49.7)
MCH: 28.4 pg (ref 25.1–34.0)
MCHC: 32.4 g/dL (ref 31.5–36.0)
MCV: 87.5 fL (ref 79.5–101.0)
MONO#: 0.4 10*3/uL (ref 0.1–0.9)
MONO%: 7.1 % (ref 0.0–14.0)
NEUT#: 3.3 10*3/uL (ref 1.5–6.5)
NEUT%: 62.9 % (ref 38.4–76.8)
PLATELETS: 230 10*3/uL (ref 145–400)
RBC: 4.55 10*6/uL (ref 3.70–5.45)
RDW: 13.2 % (ref 11.2–14.5)
WBC: 5.2 10*3/uL (ref 3.9–10.3)

## 2017-03-21 LAB — COMPREHENSIVE METABOLIC PANEL
ALK PHOS: 48 U/L (ref 40–150)
ALT: 21 U/L (ref 0–55)
AST: 21 U/L (ref 5–34)
Albumin: 3.9 g/dL (ref 3.5–5.0)
Anion Gap: 8 mEq/L (ref 3–11)
BUN: 12.5 mg/dL (ref 7.0–26.0)
CO2: 25 meq/L (ref 22–29)
Calcium: 9.8 mg/dL (ref 8.4–10.4)
Chloride: 107 mEq/L (ref 98–109)
Creatinine: 0.7 mg/dL (ref 0.6–1.1)
EGFR: 80 mL/min/{1.73_m2} — AB (ref >90)
GLUCOSE: 91 mg/dL (ref 70–140)
POTASSIUM: 4.3 meq/L (ref 3.5–5.1)
Sodium: 141 mEq/L (ref 136–145)
Total Bilirubin: 0.86 mg/dL (ref 0.20–1.20)
Total Protein: 6.8 g/dL (ref 6.4–8.3)

## 2017-03-28 ENCOUNTER — Ambulatory Visit (HOSPITAL_BASED_OUTPATIENT_CLINIC_OR_DEPARTMENT_OTHER): Payer: Medicare Other | Admitting: Oncology

## 2017-03-28 VITALS — BP 147/68 | HR 78 | Temp 97.5°F | Resp 18 | Ht 68.0 in | Wt 163.5 lb

## 2017-03-28 DIAGNOSIS — C50411 Malignant neoplasm of upper-outer quadrant of right female breast: Secondary | ICD-10-CM

## 2017-03-28 DIAGNOSIS — Z853 Personal history of malignant neoplasm of breast: Secondary | ICD-10-CM

## 2017-03-28 DIAGNOSIS — Z171 Estrogen receptor negative status [ER-]: Principal | ICD-10-CM

## 2017-03-28 NOTE — Progress Notes (Signed)
ID: [name]   DOB: Nov 14, 1940  MR#: [id number]  [id number]#:[id number]  PCP: [name], MD GYN: [name] SU: [name] OTHER MD: Arloa Koh, [name], Lillia Mountain, Clydell Hakim,   HISTORY OF PRESENT ILLNESS: From the original intake note:  Mehek had screening mammography at [facility] 04/10/2012 raising the question of some axillary lymph nodes on the right. Additional views of the right axilla and right axillary ultrasound performed 04/04/2012 showed 3 lymph nodes that appeared larger than prior. The largest measured 1.5 cm. 2 of them had cortical thickening. Close followup was suggested, and repeat right axillary ultrasound 06/25/2012 showed no significant change in the lymph nodes in question. Followup ultrasound in 3 months was recommended, but in the interim the patient saw her primary physician, [name] and ADL, and he set her up for a chest CT on 07/08/2012, which showed a mild asymmetric density in the upper mid right breast. There was a prominent right axillary lymph node measuring 1.9 cm, with a few smaller adjacent nodes asymmetric with compared to the left side. Again, short interval followup was recommended, and on 10/07/2012 the patient had digital right mammography and ultrasonography, now at the breast Center. Dr. [name] noted pleomorphic calcifications spanning an area of 6.3 cm without associated mass. Physical exam was unremarkable. Ultrasound showed an area of adenopathy in the right axilla measuring 1.6 cm, with a second lymph node with a thickened cortex measuring 1.2 cm. No suspicious mass was seen by ultrasound in the right breast.  Biopsy of the larger axillary lymph node on 10/07/2012 showed ([id number]) an invasive ductal carcinoma, triple negative, with an MIB-1 of 66%. Biopsy of the right breast the next day, [id number]) showed invasive ductal carcinoma, grade 3. Breast MRI 10/14/2012 showed a total area of irregular enhancement in the right breast  measuring up to 12 cm. MRI guided biopsy of an area in the upper inner quadrant of the right breast on 10/23/2012 showed ([id number]) ductal carcinoma in situ, with foci worrisome for invasion.  The patient's subsequent history is as detailed below  INTERVAL HISTORY: Donta returns today for follow-up of her estrogen receptor negative breast cancer accompanied by her husband. The interval history is generally unremarkable. Everything is "good" and there are some "life issues" related to family which she declined to discuss.  She did tell me that she is having a little bit more discomfort in her right axilla and there is a "mass" there that concerns her. Her range of motion in the right upper extremity is decreased. She is very concerned that this may indicate local recurrence  REVIEW OF SYSTEMS: She continues to have neuropathy problem including chiefly the big toes bilaterally, but also slightly her hands. When she drives her hands sometimes become numb. She has a ball in the car that she uses for exercise in her husband also massages her hands at times. For exercise she is doing some walking but [facility]. A detailed review of systems today was otherwise stable  PAST MEDICAL HISTORY: Past Medical History:  Diagnosis Date  . Breast cancer ([place]) 09/2012   right breast/right axillary lymph node t2,pn1, stage 11b, invasive ductal carcinma, grade 3, triple negative, with an mib-1 of 15%  . Cerebral aneurysm, nonruptured   . Cerebrovascular disease, unspecified   . Colonic polyp   . Dizziness    pt. states that she experiences this occassionally 05/03/15  . Headache  occassionally  . Heart murmur   . History of chemotherapy   . Hx of radiation therapy 05/27/13-08/13/13   right chest wall/right supraclavicular/axillary region 5220 cGy 29 sessions, right mastectomy/chest wall boost cGy 5 sessions  . Hypertension    Does not see a cardiologist  .  Hypothyroidism   . Neuropathy ([place])   . Pure hypercholesterolemia   . Rosacea   . Seasonal allergies   . TIA (transient ischemic attack)   . Wears glasses     PAST SURGICAL HISTORY: Past Surgical History:  Procedure Laterality Date  . BREAST BIOPSY Right 10/23/2012  . BREAST BIOPSY Right 10/08/2012  . COLONOSCOPY    . COLONOSCOPY W/ POLYPECTOMY    . ESOPHAGOGASTRODUODENOSCOPY    . Left middle cerebral artery angioplasty  2004   by [name] had 2 follow up occurances  . MASTECTOMY W/ SENTINEL NODE BIOPSY Right 04/23/2013   Procedure: RIGHT TOTAL MASTECTOMY WITH SENTINEL LYMPH NODE BIOPSY;  Surgeon: [name], MD;  Location: [place];  Service: General;  Laterality: Right;  . PORTACATH PLACEMENT N/A 11/05/2012   Procedure: INSERTION PORT-A-CATH;  Surgeon: [name], MD;  Location: [place];  Service: General;  Laterality: N/A;  . RADIOLOGY WITH ANESTHESIA N/A 05/04/2015   Procedure: MRI BRAIN WITH AND WITHOUT;  Surgeon: Medication Radiologist, MD;  Location: [place];  Service: Radiology;  Laterality: N/A;    FAMILY HISTORY Family History  Problem Relation Age of Onset  . Lung cancer Mother 75  . Heart disease Father   . Colon cancer Brother 58  . Alcohol abuse Brother   . Colon cancer Maternal Uncle        dx in his 67s  . Colon cancer Maternal Aunt   . Ovarian cancer Other 40  . Lung cancer Maternal Aunt   . Breast cancer Sister    the patient's father died at the age of 65. The patient's mother died at the age of 77 from lung cancer. She was not a smoker. The cancer was diagnosed when she was 74 years old. The patient's mother had 2 sisters and one brother. One of those sisters had lung cancer and a brother had colon cancer, both diagnosed in their 45s. The patient herself has one brother who died from colon cancer at the age of 35. That brother has a daughter who was diagnosed with uterine cancer at the age of 62. All this raises the question of  possible Lynch syndrome and I have referred the patient for genetics counseling. The patient does have one sister who was diagnosed with breast cancer in 2015.  GYNECOLOGIC HISTORY: Menarche age 77, menopause around 74. The patient is GX P0. She took birth control pills for many years with no complications.  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 Her stepchildren are [name] who lives in "[place]" and is financial vice president of [employer], and Olive Hill who lives in [place] and works as a Tree surgeon. The patient has 5 grandchildren. She is currently not a church attender   ADVANCED DIRECTIVES: Not in place; this was discussed 10/29/2012 and the patient was advised to complete a healthcare power of attorney document  HEALTH MAINTENANCE: Social History  Substance Use Topics  . Smoking status: Former Smoker    Packs/day: 1.00    Years: 20.00    Types: Cigarettes    Quit date: 04/02/1986  . Smokeless  tobacco: Never Used  . Alcohol use No     Colonoscopy: 02/10/2015  PAP: 2012  Bone density:  Lipid panel:  No Known Allergies  Current Outpatient Prescriptions  Medication Sig Dispense Refill  . aspirin EC 325 MG tablet Take 325 mg by mouth every morning.     . Calcium Carbonate-Vitamin D (CALTRATE 600+D PO) Take 600 mg by mouth 2 (two) times daily.    . cetirizine (ZYRTEC) 10 MG tablet Take 10 mg by mouth daily.    . clopidogrel (PLAVIX) 75 MG tablet Take 1 tablet (75 mg total) by mouth daily. 30 tablet 5  . HYDROcodone-homatropine (HYCODAN) 5-1.5 MG/5ML syrup Take 5 mLs by mouth every 6 (six) hours as needed for cough. 240 mL 0  . levofloxacin (LEVAQUIN) 500 MG tablet Take 1 tablet (500 mg total) by mouth daily. 7 tablet 0  . levothyroxine (SYNTHROID, LEVOTHROID) 112 MCG tablet Take 1 tablet (112 mcg total) by mouth daily before breakfast. 30 tablet 4  . lisinopril  (PRINIVIL,ZESTRIL) 20 MG tablet Take 1 tablet (20 mg total) by mouth daily. 30 tablet 11  . metoprolol succinate (TOPROL-XL) 100 MG 24 hr tablet Take 1 tablet (100 mg total) by mouth daily. Take with or immediately following a meal. 30 tablet 6  . metroNIDAZOLE (METROCREAM) 0.75 % cream Apply 1 application topically 2 (two) times daily. For rosacea    . Multiple Vitamin (MULTIVITAMIN WITH MINERALS) TABS tablet Take 1 tablet by mouth every morning. Centrum Silver    . predniSONE (DELTASONE) 20 MG tablet Take as directed 16 tablet 0  . rosuvastatin (CRESTOR) 20 MG tablet Take 1 tablet (20 mg total) by mouth daily. 30 tablet 5  . sertraline (ZOLOFT) 100 MG tablet Take 1 tablet (100 mg total) by mouth daily. (Patient not taking: Reported on 09/20/2016) 30 tablet 5  . sertraline (ZOLOFT) 50 MG tablet Take 50 mg by mouth daily.    Marland Kitchen SYNTHROID 112 MCG tablet TAKE ONE TABLET BY MOUTH ONCE DAILY BEFORE  BREAKFAST 90 tablet 1  . Tetrahydrozoline HCl (VISINE OP) Place 1 drop into both eyes daily as needed (dry eyes).    . venlafaxine XR (EFFEXOR-XR) 75 MG 24 hr capsule TAKE ONE CAPSULE BY MOUTH ONCE DAILY (Patient not taking: Reported on 09/20/2016) 30 capsule 5   No current facility-administered medications for this visit.     OBJECTIVE: Middle-aged white womanWho appears well  Vitals:   03/28/17 1306  BP: (!) 147/68  Pulse: 78  Resp: 18  Temp: (!) 97.5 F (36.4 C)  SpO2: 96%     Body mass index is 24.86 kg/m.    ECOG FS: 1 Filed Weights   03/28/17 1306  Weight: 163 lb 8 oz (74.2 kg)   Sclerae unicteric, EOMs intact Oropharynx clear and moist No cervical or supraclavicular adenopathy Lungs no rales or rhonchi Heart regular rate and rhythm Abd soft, nontender, positive bowel sounds MSK no focal spinal tenderness, no upper extremity lymphedema Neuro: nonfocal, well oriented, appropriate affect Breasts: She status post right mastectomy adenopathy palpated any evidence of local recurrence on the  chest wall. In the right axillae do not palpate a well-defined mass. There is a subcutaneous nodule which appears to me to be a hypertrophic gland. It is not erythematous and it is not very hard. It is easily movable. The left breast is benign. The left axilla is benign.  LAB RESULTS: Lab Results  Component Value Date   WBC 5.2 03/21/2017   NEUTROABS 3.3 03/21/2017  HGB 12.9 03/21/2017   HCT 39.8 03/21/2017   MCV 87.5 03/21/2017   PLT 230 03/21/2017      Chemistry      Component Value Date/Time   NA 141 03/21/2017 1105   K 4.3 03/21/2017 1105   CL 105 10/22/2015 1029   CL 102 01/28/2013 0909   CO2 25 03/21/2017 1105   BUN 12.5 03/21/2017 1105   CREATININE 0.7 03/21/2017 1105      Component Value Date/Time   CALCIUM 9.8 03/21/2017 1105   ALKPHOS 48 03/21/2017 1105   AST 21 03/21/2017 1105   ALT 21 03/21/2017 1105   BILITOT 0.86 03/21/2017 1105       STUDIES: Most recent left screening mammography was 05/16/2016, showing a breast density category C. There was no evidence of malignancy.  ASSESSMENT: 76 y.o. [place] woman   (1)  status post right breast upper outer quadrant and right axillary lymph node biopsies February 2014 of a clinical T2, pN1, stage IIB  invasive ductal carcinoma, grade 3, triple negative, with an MIB-1 of 15%  (2)  a third biopsy from a different quadrant 10/23/2012 showed ductal carcinoma in situ  (3)  treated in the neoadjuvant setting with 4 dose dense cycles of doxorubicin/ cyclophosphamide 12/23/2012, followed by 7 weekly doses of paclitaxel and completed 03/03/2013.  (4) status post right mastectomy and sentinel lymph node sampling 04/23/2013 for a residual ypT1b ypN1 invasive ductal carcinoma, grade 3, again triple negative [2/10 sample lymph nodes were involved by tumor, both with micrometastatic deposits]  (a) the patient opted against reconstruction  (5) adjuvant radiation completed 08/13/2013  (6) the patient underwent genetics  counseling but declined testing   PLAN:  Shianne is now a little over 4 years out from definitive surgery for her breast cancer with no evidence of disease recurrence. This is very favorable.  I reassured her that the slightly increased discomfort and this slightly decreased range of motion she is experiencing in her right upper extremity is not uncommon. Scar tissue is not dead, it continues to ream old, and always tends to tighten up. I think she will benefit from rehabilitation and she was agreeable to that referral, which was placed today.  I don't palpate anything suspicious in the right axilla. There is something that appears to be a slightly hypertrophic gland, although of course a subcutaneous med might look similar (it does not seem to me to be sufficiently hard). I am setting her up for right axillary ultrasound and biopsy as appropriate.  Because I expect these to be unremarkable, her next appointment with me will be for late October 2019. That will give me the results of her mammogram on the left neck year and she should be able to "graduate" after that visit  She knows to call for any other issues that may develop before then Chauncey Cruel, MD    03/28/2017

## 2017-03-29 DIAGNOSIS — Z961 Presence of intraocular lens: Secondary | ICD-10-CM | POA: Diagnosis not present

## 2017-04-04 ENCOUNTER — Other Ambulatory Visit: Payer: Self-pay | Admitting: Pulmonary Disease

## 2017-04-12 ENCOUNTER — Ambulatory Visit: Payer: Medicare Other | Attending: Oncology | Admitting: Physical Therapy

## 2017-04-12 ENCOUNTER — Ambulatory Visit: Payer: Medicare Other | Admitting: Physical Therapy

## 2017-04-12 ENCOUNTER — Encounter: Payer: Self-pay | Admitting: Physical Therapy

## 2017-04-12 DIAGNOSIS — M25611 Stiffness of right shoulder, not elsewhere classified: Secondary | ICD-10-CM | POA: Diagnosis not present

## 2017-04-12 DIAGNOSIS — R293 Abnormal posture: Secondary | ICD-10-CM

## 2017-04-12 DIAGNOSIS — M25511 Pain in right shoulder: Secondary | ICD-10-CM | POA: Insufficient documentation

## 2017-04-12 DIAGNOSIS — M6281 Muscle weakness (generalized): Secondary | ICD-10-CM | POA: Diagnosis not present

## 2017-04-12 DIAGNOSIS — G8929 Other chronic pain: Secondary | ICD-10-CM | POA: Diagnosis not present

## 2017-04-12 NOTE — Therapy (Signed)
[place], [place], [address] Phone: [phone]   Fax:  [phone]  Physical Therapy Evaluation  Patient Details  Name: Debra Mcintyre MRN: [id number] Date of Birth: 1940/08/27 Referring Provider: Magrinat  Encounter Date: 04/12/2017      PT End of Session - 04/12/17 1608    Visit Number 1   Number of Visits 9   Date for PT Re-Evaluation 05/17/17   PT Start Time 1520   PT Stop Time 1558   PT Time Calculation (min) 38 min   Activity Tolerance Patient tolerated treatment well   Behavior During Therapy [facility] for tasks assessed/performed      Past Medical History:  Diagnosis Date  . Breast cancer ([place]) 09/2012   right breast/right axillary lymph node t2,pn1, stage 11b, invasive ductal carcinma, grade 3, triple negative, with an mib-1 of 15%  . Cerebral aneurysm, nonruptured   . Cerebrovascular disease, unspecified   . Colonic polyp   . Dizziness    pt. states that she experiences this occassionally 05/03/15  . Headache    occassionally  . Heart murmur   . History of chemotherapy   . Hx of radiation therapy 05/27/13-08/13/13   right chest wall/right supraclavicular/axillary region 5220 cGy 29 sessions, right mastectomy/chest wall boost cGy 5 sessions  . Hypertension    Does not see a cardiologist  . Hypothyroidism   . Neuropathy   . Pure hypercholesterolemia   . Rosacea   . Seasonal allergies   . TIA (transient ischemic attack)   . Wears glasses     Past Surgical History:  Procedure Laterality Date  . BREAST BIOPSY Right 10/23/2012  . BREAST BIOPSY Right 10/08/2012  . COLONOSCOPY    . COLONOSCOPY W/ POLYPECTOMY    . ESOPHAGOGASTRODUODENOSCOPY    . Left middle cerebral artery angioplasty  2004   by [name] had 2 follow up occurances  . MASTECTOMY W/ SENTINEL NODE BIOPSY Right 04/23/2013   Procedure: RIGHT TOTAL MASTECTOMY WITH SENTINEL LYMPH NODE BIOPSY;  Surgeon: [name], MD;  Location: [place];  Service: General;  Laterality: Right;  . PORTACATH PLACEMENT N/A 11/05/2012   Procedure: INSERTION PORT-A-CATH;  Surgeon: [name], MD;  Location: [place];  Service: General;  Laterality: N/A;  . RADIOLOGY WITH ANESTHESIA N/A 05/04/2015   Procedure: MRI BRAIN WITH AND WITHOUT;  Surgeon: Medication Radiologist, MD;  Location: [place];  Service: Radiology;  Laterality: N/A;    There were no vitals filed for this visit.       Subjective Assessment - 04/12/17 1524    Subjective I am having trouble reaching up in to the cabinets. I am having decreased right shoulder ROM. I am having pain in my right armpit when I raise my arm. It also hurts below my armpit. There are shooting pains. I also have neuropathy in my hands and feet. It is hard to drive.    Pertinent History Biopsy of the larger axillary lymph node on 10/07/2012 showed ([id number] [id number]) an invasive ductal carcinoma, triple negative, with an MIB-1 of 66%. Biopsy of the right breast the next day, [id number] [id number]) showed invasive ductal carcinoma, grade 3. Breast MRI 10/14/2012 showed a total area of irregular enhancement in the right breast measuring up to 12 cm. MRI guided biopsy of an area in the upper inner quadrant of the right breast on 10/23/2012 showed ([id number] [id number]) ductal carcinoma in situ, with foci worrisome for invasion., 04/23/2013 R total mastectomy with SLNB,  completed chemo and radiation   Patient Stated Goals to decrease the pain, improve ROM   Currently in Pain? Yes   Pain Score 2    Pain Location Axilla   Pain Orientation Right   Pain Descriptors / Indicators Discomfort   Pain Type Chronic pain   Pain Onset More than a month ago   Pain Frequency Constant   Aggravating Factors  driving long distance, cleaning, yard work, lifting, reaching overhead   Pain Relieving Factors extra strength tylenol   Effect of Pain on Daily Activities difficulty with ADLs             [facility] PT Assessment - 04/12/17 0001      Assessment   Medical Diagnosis right breast cancer   Referring Provider Magrinat   Onset Date/Surgical Date 04/23/13   Prior Therapy I was coming here but hurt my back when rolling ball up wall and had to go have a shot in my back on Nov 20th, 2014 and I had to stop therapy     Precautions   Precautions Other (comment)  at risk for lymphedema     Restrictions   Weight Bearing Restrictions No     Balance Screen   Has the patient fallen in the past 6 months No   Has the patient had a decrease in activity level because of a fear of falling?  No   Is the patient reluctant to leave their home because of a fear of falling?  No     Home Environment   Living Environment Private residence   Living Arrangements Spouse/significant other   Available Help at Discharge Family   Type of [place] to enter   Entrance Stairs-Number of Steps 1   Entrance Stairs-Rails None   Home Layout Two level   Alternate Level Stairs-Number of Steps 17   Alternate Level Stairs-Rails Left   Home Equipment None     Prior Function   Level of Independence Independent   Vocation Retired   Leisure walks dog 3-4x/day, starting exercise program 2x/wk at the [employer]   Overall Cognitive Status Within Functional Limits for tasks assessed     Observation/Other Assessments   Observations some slight edema in right trunk   Skin Integrity has small "pustule" like area in right axilla that she states has been there for years but currently has a head and is oozing slightly     ROM / Strength   AROM / PROM / Strength AROM     AROM   Overall AROM  Deficits   AROM Assessment Site Shoulder   Right/Left Shoulder Right;Left   Right Shoulder Flexion 131 Degrees   Right Shoulder ABduction 110 Degrees   Right Shoulder Internal Rotation 38 Degrees  with pain   Right Shoulder External Rotation 76 Degrees   Left Shoulder Flexion 159 Degrees   Left  Shoulder ABduction 171 Degrees   Left Shoulder Internal Rotation 70 Degrees   Left Shoulder External Rotation 90 Degrees           LYMPHEDEMA/ONCOLOGY QUESTIONNAIRE - 04/12/17 1535      Type   Cancer Type right breast cancer     Surgeries   Mastectomy Date 04/23/13   Sentinel Lymph Node Biopsy Date 04/23/13   Number Lymph Nodes Removed 6     Treatment   Active Chemotherapy Treatment No   Past Chemotherapy Treatment Yes   Date 11/11/12   Active Radiation  Treatment No   Past Radiation Treatment Yes   Date 05/13/13   Current Hormone Treatment No   Past Hormone Therapy No     What other symptoms do you have   Are you Having Heaviness or Tightness Yes   Are you having Pain Yes   Are you having pitting edema No   Is it Hard or Difficult finding clothes that fit No   Do you have infections No   Is there Decreased scar mobility No     Lymphedema Assessments   Lymphedema Assessments Upper extremities     Right Upper Extremity Lymphedema   15 cm Proximal to Olecranon Process 26 cm   Olecranon Process 24.5 cm   15 cm Proximal to Ulnar Styloid Process 23.7 cm   Just Proximal to Ulnar Styloid Process 16.4 cm   Across Hand at [employer] 19 cm   At [place] of 2nd Digit 6.5 cm     Left Upper Extremity Lymphedema   15 cm Proximal to Olecranon Process 26 cm   Olecranon Process 24.6 cm   15 cm Proximal to Ulnar Styloid Process 23.5 cm   Just Proximal to Ulnar Styloid Process 16.1 cm   Across Hand at [employer] 19.5 cm   At [place] of 2nd Digit 6.5 cm           Quick Dash - 04/12/17 0001    Open a tight or new jar Moderate difficulty   Do heavy household chores (wash walls, wash floors) Moderate difficulty   Carry a shopping bag or briefcase Moderate difficulty   Wash your back Moderate difficulty   Use a knife to cut food Mild difficulty   Recreational activities in which you take some force or impact through your arm, shoulder, or hand (golf, hammering, tennis)  Unable   During the past week, to what extent has your arm, shoulder or hand problem interfered with your normal social activities with family, friends, neighbors, or groups? Slightly   During the past week, to what extent has your arm, shoulder or hand problem limited your work or other regular daily activities Modererately   Arm, shoulder, or hand pain. Severe   Tingling (pins and needles) in your arm, shoulder, or hand Severe   Difficulty Sleeping Moderate difficulty   DASH Score 54.55 %      Objective measurements completed on examination: See above findings.                          [facility] Goals - 04/12/17 1657      CC Long Term Goal  #1   Title Pt to report a 70% improvement in pain in R lateral trunk to allow improved comfort   Time 5   Period Weeks   Status New   Target Date 05/17/17     CC Long Term Goal  #2   Title Pt to demonstrate 155 degrees of right shoulder flexion to allow her to reach items in cabinets   Baseline 131   Time 5   Period Weeks   Status New   Target Date 05/17/17     CC Long Term Goal  #3   Title Pt to demonstrate 165 degrees of right shoulder abduction to allow her to reach items out to sides   Baseline 110   Time 5   Period Weeks   Status New   Target Date 05/17/17     CC  Long Term Goal  #4   Title Pt to demonstrate 60 degrees of right shoulder IR to allow pt to return to prior level of function   Baseline 38   Time 5   Period Weeks   Status New   Target Date 05/17/17     CC Long Term Goal  #5   Title Pt to be independent in a home exercise program for continued strengthening and stretching   Time 5   Period Weeks   Status New             Plan - 04/12/17 1608    Clinical Impression Statement Pt presents to PT with R shoulder and lateral trunk pain and decreased right shoulder ROM. She underwent a R mastectomy with SLNB in 2014 for treatment of R breast cancer. She was coming to therapy following  this but injured her back and had to stop coming. She states she never regained her ROM. The pain started a few months ago. Pt has significant scar tissue in R chest with tightness extending into R axilla. She also has a small lump in her R armpit which she states has been there for a long time and has been cleared by the doctor. It is draining some today. She has possibly some mild edema in right trunk where she experiences pain. Pt would benefit from skilled PT services to increase R shoulder ROM, decrease R shoulder and trunk pain, increase R shoulder strength and improve posture.    History and Personal Factors relevant to plan of care: none   Clinical Presentation Stable   Clinical Presentation due to: pt has completed all treatments   Clinical Decision Making Low   Rehab Potential Good   Clinical Impairments Affecting Rehab Potential hx of radiation   PT Frequency 2x / week   PT Duration --  5 weeks   PT Treatment/Interventions ADLs/Self Care Home Management;Patient/family education;Manual techniques;Therapeutic exercise;Manual lymph drainage;Scar mobilization;Passive range of motion;Taping   PT Next Visit Plan begin MLD to R trunk, give supine dowel exercises, do gentle AA/A/PROM   Consulted and Agree with Plan of Care Patient      Patient will benefit from skilled therapeutic intervention in order to improve the following deficits and impairments:  Increased edema, Decreased knowledge of precautions, Impaired UE functional use, Decreased strength, Decreased range of motion, Decreased scar mobility, Postural dysfunction, Pain, Increased fascial restricitons  Visit Diagnosis: Stiffness of right shoulder, not elsewhere classified - Plan: PT plan of care cert/re-cert  Chronic right shoulder pain - Plan: PT plan of care cert/re-cert  Abnormal posture - Plan: PT plan of care cert/re-cert  Muscle weakness (generalized) - Plan: PT plan of care cert/re-cert      G-Codes - 08/22/30 1657     Functional Assessment Tool Used (Outpatient Only) quick dash   Functional Limitation Carrying, moving and handling objects   Carrying, Moving and Handling Objects Current Status (T5573) At least 40 percent but less than 60 percent impaired, limited or restricted   Carrying, Moving and Handling Objects Goal Status (U2025) At least 1 percent but less than 20 percent impaired, limited or restricted       Problem List Patient Active Problem List   Diagnosis Date Noted  . Malignant neoplasm of upper-outer quadrant of right breast in female, estrogen receptor negative ([place]) 05/13/2013  . Anemia, unspecified 02/24/2013  . GERD (gastroesophageal reflux disease) 12/16/2012  . Asthmatic bronchitis 04/21/2011  . INTRACRANIAL ANEURYSM 08/28/2009  . Cerebrovascular disease 08/28/2009  .  COLONIC POLYPS 08/26/2009  . Essential hypertension 05/12/2008  . HEMORRHOIDS 05/12/2008  . Hypothyroidism 12/02/2007  . HYPERCHOLESTEROLEMIA 12/02/2007  . Anxiety state 12/02/2007  . Transient cerebral ischemia 12/02/2007  . Osteoarthritis 12/02/2007  . FIBROMYALGIA 12/02/2007  . Osteoporosis 12/02/2007    Allyson Sabal [facility] 04/12/2017, 5:01 PM  [place], [place], [address] Phone: [phone]   Fax:  [phone]  Name: MARVIS BAKKEN MRN: [id number] Date of Birth: 08-17-1940   Manus Gunning, PT 04/12/17 5:01 PM

## 2017-04-13 ENCOUNTER — Other Ambulatory Visit: Payer: Self-pay | Admitting: Oncology

## 2017-04-13 ENCOUNTER — Telehealth: Payer: Self-pay | Admitting: Pulmonary Disease

## 2017-04-13 DIAGNOSIS — Z171 Estrogen receptor negative status [ER-]: Principal | ICD-10-CM

## 2017-04-13 DIAGNOSIS — M810 Age-related osteoporosis without current pathological fracture: Secondary | ICD-10-CM

## 2017-04-13 DIAGNOSIS — N63 Unspecified lump in unspecified breast: Secondary | ICD-10-CM

## 2017-04-13 DIAGNOSIS — C50411 Malignant neoplasm of upper-outer quadrant of right female breast: Secondary | ICD-10-CM

## 2017-04-13 NOTE — Telephone Encounter (Signed)
Spoke with patient. She is requesting a bone density scan order. She stated that she had 5 years ago and wants another one. She prefers to go to [facility] for the scan.   SN, please advise. Thanks!

## 2017-04-17 NOTE — Telephone Encounter (Signed)
I have placed the order for the bond density and I have called and lmom to make the pt aware of this.

## 2017-04-23 ENCOUNTER — Ambulatory Visit: Payer: Medicare Other | Attending: Oncology | Admitting: Physical Therapy

## 2017-04-23 ENCOUNTER — Encounter: Payer: Self-pay | Admitting: Physical Therapy

## 2017-04-23 DIAGNOSIS — M6281 Muscle weakness (generalized): Secondary | ICD-10-CM | POA: Insufficient documentation

## 2017-04-23 DIAGNOSIS — R293 Abnormal posture: Secondary | ICD-10-CM

## 2017-04-23 DIAGNOSIS — M25511 Pain in right shoulder: Secondary | ICD-10-CM | POA: Diagnosis not present

## 2017-04-23 DIAGNOSIS — M25611 Stiffness of right shoulder, not elsewhere classified: Secondary | ICD-10-CM | POA: Insufficient documentation

## 2017-04-23 DIAGNOSIS — G8929 Other chronic pain: Secondary | ICD-10-CM

## 2017-04-23 NOTE — Patient Instructions (Signed)
Shoulder: Flexion (Supine)    With hands shoulder width apart, slowly lower dowel to floor behind head. Do not let elbows bend. Keep back flat. Hold _10-30___ seconds. Repeat __10__ times. Do __2__ sessions per day. CAUTION: Stretch slowly and gently.  Copyright  VHI. All rights reserved.  Shoulder: Abduction (Supine)    With right arm flat on floor, hold dowel in palm. Slowly move arm up to side of head by pushing with opposite arm. Do not let elbow bend. Hold _10-30___ seconds. Repeat _10___ times. Do _2___ sessions per day. CAUTION: Stretch slowly and gently.  Copyright  VHI. All rights reserved.

## 2017-04-23 NOTE — Therapy (Signed)
[place], [place], [address] Phone: [phone]   Fax:  [phone]  Physical Therapy Treatment  Patient Details  Name: Debra Mcintyre MRN: [id number] Date of Birth: 10/02/1940 Referring Provider: Magrinat  Encounter Date: 04/23/2017      PT End of Session - 04/23/17 1223    Visit Number 2   Number of Visits 9   Date for PT Re-Evaluation 05/17/17   PT Start Time 5732  pt arrived late   PT Stop Time 0934   PT Time Calculation (min) 39 min   Activity Tolerance Patient tolerated treatment well   Behavior During Therapy [facility] for tasks assessed/performed      Past Medical History:  Diagnosis Date  . Breast cancer ([place]) 09/2012   right breast/right axillary lymph node t2,pn1, stage 11b, invasive ductal carcinma, grade 3, triple negative, with an mib-1 of 15%  . Cerebral aneurysm, nonruptured   . Cerebrovascular disease, unspecified   . Colonic polyp   . Dizziness    pt. states that she experiences this occassionally 05/03/15  . Headache    occassionally  . Heart murmur   . History of chemotherapy   . Hx of radiation therapy 05/27/13-08/13/13   right chest wall/right supraclavicular/axillary region 5220 cGy 29 sessions, right mastectomy/chest wall boost cGy 5 sessions  . Hypertension    Does not see a cardiologist  . Hypothyroidism   . Neuropathy   . Pure hypercholesterolemia   . Rosacea   . Seasonal allergies   . TIA (transient ischemic attack)   . Wears glasses     Past Surgical History:  Procedure Laterality Date  . BREAST BIOPSY Right 10/23/2012  . BREAST BIOPSY Right 10/08/2012  . COLONOSCOPY    . COLONOSCOPY W/ POLYPECTOMY    . ESOPHAGOGASTRODUODENOSCOPY    . Left middle cerebral artery angioplasty  2004   by [name] had 2 follow up occurances  . MASTECTOMY W/ SENTINEL NODE BIOPSY Right 04/23/2013   Procedure: RIGHT TOTAL MASTECTOMY WITH SENTINEL LYMPH NODE BIOPSY;   Surgeon: [name], MD;  Location: [place];  Service: General;  Laterality: Right;  . PORTACATH PLACEMENT N/A 11/05/2012   Procedure: INSERTION PORT-A-CATH;  Surgeon: [name], MD;  Location: [place];  Service: General;  Laterality: N/A;  . RADIOLOGY WITH ANESTHESIA N/A 05/04/2015   Procedure: MRI BRAIN WITH AND WITHOUT;  Surgeon: Medication Radiologist, MD;  Location: [place];  Service: Radiology;  Laterality: N/A;    There were no vitals filed for this visit.      Subjective Assessment - 04/23/17 0858    Subjective My shoulder is uncomfortable today. I did some yard work this weekend so it has been hurting some.    Pertinent History Biopsy of the larger axillary lymph node on 10/07/2012 showed ([id number]) an invasive ductal carcinoma, triple negative, with an MIB-1 of 66%. Biopsy of the right breast the next day, [id number] [id number]) showed invasive ductal carcinoma, grade 3. Breast MRI 10/14/2012 showed a total area of irregular enhancement in the right breast measuring up to 12 cm. MRI guided biopsy of an area in the upper inner quadrant of the right breast on 10/23/2012 showed ([id number] [id number]) ductal carcinoma in situ, with foci worrisome for invasion., 04/23/2013 R total mastectomy with SLNB, completed chemo and radiation   Patient Stated Goals to decrease the pain, improve ROM   Currently in Pain? No/denies   Pain Score 0-No pain  [facility] Adult PT Treatment/Exercise - 04/23/17 0001      Exercises   Exercises Shoulder     Shoulder Exercises: Supine   Flexion AAROM;Both;10 reps  with dowel with 10 sec hold   ABduction AAROM;Right;10 reps  with dowel with 10 sec hold     Manual Therapy   Manual Therapy Manual Lymphatic Drainage (MLD);Passive ROM   Manual Lymphatic Drainage (MLD) short neck, 5 diaphragmatic breaths, right inguinal nodes and establishment of axillo inguinal pathway, left axillary nodes and establishment of  interaxillary pathway, R trunk in area of swelling moving fluid towards pathways   Passive ROM to R shoulder in direction of flexion, abduction, and ER                        [facility] Goals - 04/12/17 1657      CC Long Term Goal  #1   Title Pt to report a 70% improvement in pain in R lateral trunk to allow improved comfort   Time 5   Period Weeks   Status New   Target Date 05/17/17     CC Long Term Goal  #2   Title Pt to demonstrate 155 degrees of right shoulder flexion to allow her to reach items in cabinets   Baseline 131   Time 5   Period Weeks   Status New   Target Date 05/17/17     CC Long Term Goal  #3   Title Pt to demonstrate 165 degrees of right shoulder abduction to allow her to reach items out to sides   Baseline 110   Time 5   Period Weeks   Status New   Target Date 05/17/17     CC Long Term Goal  #4   Title Pt to demonstrate 60 degrees of right shoulder IR to allow pt to return to prior level of function   Baseline 38   Time 5   Period Weeks   Status New   Target Date 05/17/17     CC Long Term Goal  #5   Title Pt to be independent in a home exercise program for continued strengthening and stretching   Time 5   Period Weeks   Status New            Plan - 04/23/17 1223    Clinical Impression Statement Instructed pt in supine dowel exercises today. Began MLD to R trunk to help decrease swelling. Will educate pt in this technique at next session. Started PROM to R shoulder to help improve ROM.    Rehab Potential Good   Clinical Impairments Affecting Rehab Potential hx of radiation   PT Frequency 2x / week   PT Duration --  5 weeks   PT Treatment/Interventions ADLs/Self Care Home Management;Patient/family education;Manual techniques;Therapeutic exercise;Manual lymph drainage;Scar mobilization;Passive range of motion;Taping   PT Next Visit Plan give handout about MLD to R trunk, do gentle AA/A/PROM, supine scap?   Consulted and  Agree with Plan of Care Patient      Patient will benefit from skilled therapeutic intervention in order to improve the following deficits and impairments:  Increased edema, Decreased knowledge of precautions, Impaired UE functional use, Decreased strength, Decreased range of motion, Decreased scar mobility, Postural dysfunction, Pain, Increased fascial restricitons  Visit Diagnosis: Stiffness of right shoulder, not elsewhere classified  Chronic right shoulder pain  Abnormal posture     Problem List Patient Active Problem List   Diagnosis Date Noted  .  Malignant neoplasm of upper-outer quadrant of right breast in female, estrogen receptor negative ([place]) 05/13/2013  . Anemia, unspecified 02/24/2013  . GERD (gastroesophageal reflux disease) 12/16/2012  . Asthmatic bronchitis 04/21/2011  . INTRACRANIAL ANEURYSM 08/28/2009  . Cerebrovascular disease 08/28/2009  . COLONIC POLYPS 08/26/2009  . Essential hypertension 05/12/2008  . HEMORRHOIDS 05/12/2008  . Hypothyroidism 12/02/2007  . HYPERCHOLESTEROLEMIA 12/02/2007  . Anxiety state 12/02/2007  . Transient cerebral ischemia 12/02/2007  . Osteoarthritis 12/02/2007  . FIBROMYALGIA 12/02/2007  . Osteoporosis 12/02/2007    Allyson Sabal [facility] 04/23/2017, 12:25 PM  [place], [place], [address] Phone: [phone]   Fax:  [phone]  Name: Debra Mcintyre MRN: [id number] Date of Birth: 1940-08-31  Manus Gunning, PT 04/23/17 12:26 PM

## 2017-04-25 ENCOUNTER — Other Ambulatory Visit: Payer: Self-pay | Admitting: Pulmonary Disease

## 2017-04-25 ENCOUNTER — Encounter: Payer: Self-pay | Admitting: Physical Therapy

## 2017-04-25 ENCOUNTER — Ambulatory Visit: Payer: Medicare Other | Admitting: Physical Therapy

## 2017-04-25 DIAGNOSIS — M25611 Stiffness of right shoulder, not elsewhere classified: Secondary | ICD-10-CM

## 2017-04-25 DIAGNOSIS — G8929 Other chronic pain: Secondary | ICD-10-CM | POA: Diagnosis not present

## 2017-04-25 DIAGNOSIS — M25511 Pain in right shoulder: Secondary | ICD-10-CM

## 2017-04-25 DIAGNOSIS — M6281 Muscle weakness (generalized): Secondary | ICD-10-CM

## 2017-04-25 DIAGNOSIS — R293 Abnormal posture: Secondary | ICD-10-CM | POA: Diagnosis not present

## 2017-04-25 NOTE — Patient Instructions (Addendum)
Self manual lymph drainage: Perform this sequence once a day.  Only give enough pressure no your skin to make the skin move.  Diaphragmatic - Supine   Inhale through nose making navel move out toward hands. Exhale through puckered lips, hands follow navel in. Repeat _5__ times. Rest _10__ seconds between repeats.   Copyright  VHI. All rights reserved.  Hug yourself.  Do circles at your neck just above your collarbones.  Repeat this 10 times.  Axilla - One at a Time   Using full weight of flat hand and fingers at center of uninvolved armpit, make _10__ in-place circles.   Copyright  VHI. All rights reserved.  LEG: Inguinal Nodes Stimulation   With small finger side of hand against hip crease on involved side, gently perform circles at the crease. Repeat __10_ times.   Copyright  VHI. All rights reserved.  Axilla to Inguinal Nodes - Sweep   Begin by stretching skin down towards your groin along your side away from the area where you notice swelling. Be careful to lightly stretch skin and not to rub over skin. Continue working up one hand width at a time until you have reached the area of swelling, always stretching the skin down towards your groin. You will also make a pathway across your chest from your involved armpit to your uninvolved armpit, stretching the skin towards the uninvolved side. Spend about 10-15 min working on your right side moving fluid towards the pathways you created.   Repeat the steps above where you do circles in your left groin and right armpit. Copyright  VHI. All rights reserved.        Over Head Pull: Narrow and Wide Grip   Cancer Rehab [phone]   On back, knees bent, feet flat, band across thighs, elbows straight but relaxed. Pull hands apart (start). Keeping elbows straight, bring arms up and over head, hands toward floor. Keep pull steady on band. Hold momentarily. Return slowly, keeping pull steady, back to start. Then do same with a wider  grip on the band (past shoulder width) Repeat _10__ times. Band color __yellow____   Side Pull: Double Arm   On back, knees bent, feet flat. Arms perpendicular to body, shoulder level, elbows straight but relaxed. Pull arms out to sides, elbows straight. Resistance band comes across collarbones, hands toward floor. Hold momentarily. Slowly return to starting position. Repeat _10__ times. Band color _yellow____   Sword   On back, knees bent, feet flat, left hand on left hip, right hand above left. Pull right arm DIAGONALLY (hip to shoulder) across chest. Bring right arm along head toward floor. Hold momentarily. Slowly return to starting position. Repeat _10__ times. Do with left arm. Band color _yellow_____   Shoulder Rotation: Double Arm   On back, knees bent, feet flat, elbows tucked at sides, bent 90, hands palms up. Pull hands apart and down toward floor, keeping elbows near sides. Hold momentarily. Slowly return to starting position. Repeat _10__ times. Band color __yellow____

## 2017-04-25 NOTE — Therapy (Signed)
[place], [place], [address] Phone: [phone]   Fax:  [phone]  Physical Therapy Treatment  Patient Details  Name: Debra Mcintyre MRN: [id number] Date of Birth: 13-Nov-1940 Referring Provider: Magrinat  Encounter Date: 04/25/2017      PT End of Session - 04/25/17 1704    Visit Number 3   Number of Visits 9   Date for PT Re-Evaluation 05/17/17   PT Start Time 2263   PT Stop Time 1519   PT Time Calculation (min) 43 min   Activity Tolerance Patient tolerated treatment well   Behavior During Therapy [facility] for tasks assessed/performed      Past Medical History:  Diagnosis Date  . Breast cancer ([place]) 09/2012   right breast/right axillary lymph node t2,pn1, stage 11b, invasive ductal carcinma, grade 3, triple negative, with an mib-1 of 15%  . Cerebral aneurysm, nonruptured   . Cerebrovascular disease, unspecified   . Colonic polyp   . Dizziness    pt. states that she experiences this occassionally 05/03/15  . Headache    occassionally  . Heart murmur   . History of chemotherapy   . Hx of radiation therapy 05/27/13-08/13/13   right chest wall/right supraclavicular/axillary region 5220 cGy 29 sessions, right mastectomy/chest wall boost cGy 5 sessions  . Hypertension    Does not see a cardiologist  . Hypothyroidism   . Neuropathy   . Pure hypercholesterolemia   . Rosacea   . Seasonal allergies   . TIA (transient ischemic attack)   . Wears glasses     Past Surgical History:  Procedure Laterality Date  . BREAST BIOPSY Right 10/23/2012  . BREAST BIOPSY Right 10/08/2012  . COLONOSCOPY    . COLONOSCOPY W/ POLYPECTOMY    . ESOPHAGOGASTRODUODENOSCOPY    . Left middle cerebral artery angioplasty  2004   by [name] had 2 follow up occurances  . MASTECTOMY W/ SENTINEL NODE BIOPSY Right 04/23/2013   Procedure: RIGHT TOTAL MASTECTOMY WITH SENTINEL LYMPH NODE BIOPSY;  Surgeon: [name], MD;  Location: [place];  Service: General;  Laterality: Right;  . PORTACATH PLACEMENT N/A 11/05/2012   Procedure: INSERTION PORT-A-CATH;  Surgeon: [name], MD;  Location: [place];  Service: General;  Laterality: N/A;  . RADIOLOGY WITH ANESTHESIA N/A 05/04/2015   Procedure: MRI BRAIN WITH AND WITHOUT;  Surgeon: Medication Radiologist, MD;  Location: [place];  Service: Radiology;  Laterality: N/A;    There were no vitals filed for this visit.      Subjective Assessment - 04/25/17 1439    Subjective I am feeling okay. I had a little pain in my arms this morning. I didn't take any tylenol but it does feel better. It felt better after last time. [facility]'t do my [facility] yesterday.    Pertinent History Biopsy of the larger axillary lymph node on 10/07/2012 showed ([id number]) an invasive ductal carcinoma, triple negative, with an MIB-1 of 66%. Biopsy of the right breast the next day, [id number] [id number]) showed invasive ductal carcinoma, grade 3. Breast MRI 10/14/2012 showed a total area of irregular enhancement in the right breast measuring up to 12 cm. MRI guided biopsy of an area in the upper inner quadrant of the right breast on 10/23/2012 showed ([id number] [id number]) ductal carcinoma in situ, with foci worrisome for invasion., 04/23/2013 R total mastectomy with SLNB, completed chemo and radiation   Patient Stated Goals to decrease the  pain, improve ROM   Currently in Pain? No/denies   Pain Score 0-No pain            [facility] PT Assessment - 04/25/17 0001      AROM   Right Shoulder Flexion 134 Degrees   Right Shoulder ABduction 114 Degrees   Right Shoulder Internal Rotation 53 Degrees                     [facility] Adult PT Treatment/Exercise - 04/25/17 0001      Shoulder Exercises: Supine   Horizontal ABduction Strengthening;Both;10 reps;Theraband   Theraband Level (Shoulder Horizontal ABduction) Level 1 (Yellow)   External Rotation  Strengthening;Both;10 reps;Theraband   Theraband Level (Shoulder External Rotation) Level 1 (Yellow)   Flexion Strengthening;Both;10 reps;Theraband  narrow and wide grip   Theraband Level (Shoulder Flexion) Level 1 (Yellow)   Other Supine Exercises D2 x 10 reps bilaterally with yellow theraband     Manual Therapy   Manual Lymphatic Drainage (MLD) Verbally instructed pt in the following while performing brief demonstration and issuing handout as follows: short neck, 5 diaphragmatic breaths, right inguinal nodes and establishment of axillo inguinal pathway, left axillary nodes and establishment of interaxillary pathway, R trunk in area of swelling moving fluid towards pathways                        Long Term Clinic Goals - 04/25/17 1442      CC Long Term Goal  #1   Title Pt to report a 70% improvement in pain in R lateral trunk to allow improved comfort   Baseline 04/25/17- 30% improvement   Time 5   Period Weeks   Status On-going     CC Long Term Goal  #2   Title Pt to demonstrate 155 degrees of right shoulder flexion to allow her to reach items in cabinets   Baseline 131, 04/25/17- 134 degrees   Time 5   Period Weeks   Status On-going     CC Long Term Goal  #3   Title Pt to demonstrate 165 degrees of right shoulder abduction to allow her to reach items out to sides   Baseline 110, 04/25/17- 114 degrees   Time 5   Period Weeks   Status On-going     CC Long Term Goal  #4   Title Pt to demonstrate 60 degrees of right shoulder IR to allow pt to return to prior level of function   Baseline 38, 04/25/17- 53 degrees with p!   Time 5   Period Weeks   Status On-going     CC Long Term Goal  #5   Title Pt to be independent in a home exercise program for continued strengthening and stretching   Time 5   Period Weeks   Status On-going            Plan - 04/25/17 1705    Clinical Impression Statement Instructed pt in supine scapular exercises today using yellow  theraband and issued this as part of pt's home program. Also issued handout for pt to perform self drainage on right trunk swelling at home.    Rehab Potential Good   Clinical Impairments Affecting Rehab Potential hx of radiation   PT Frequency 2x / week   PT Duration --  5 weeks   PT Treatment/Interventions ADLs/Self Care Home Management;Patient/family education;Manual techniques;Therapeutic exercise;Manual lymph drainage;Scar mobilization;Passive range of motion;Taping   PT Next Visit Plan do gentle  AA/A/PROM, assess indep with supine scap, pulleys, ball   Consulted and Agree with Plan of Care Patient      Patient will benefit from skilled therapeutic intervention in order to improve the following deficits and impairments:  Increased edema, Decreased knowledge of precautions, Impaired UE functional use, Decreased strength, Decreased range of motion, Decreased scar mobility, Postural dysfunction, Pain, Increased fascial restricitons  Visit Diagnosis: Stiffness of right shoulder, not elsewhere classified  Chronic right shoulder pain  Abnormal posture  Muscle weakness (generalized)     Problem List Patient Active Problem List   Diagnosis Date Noted  . Malignant neoplasm of upper-outer quadrant of right breast in female, estrogen receptor negative ([place]) 05/13/2013  . Anemia, unspecified 02/24/2013  . GERD (gastroesophageal reflux disease) 12/16/2012  . Asthmatic bronchitis 04/21/2011  . INTRACRANIAL ANEURYSM 08/28/2009  . Cerebrovascular disease 08/28/2009  . COLONIC POLYPS 08/26/2009  . Essential hypertension 05/12/2008  . HEMORRHOIDS 05/12/2008  . Hypothyroidism 12/02/2007  . HYPERCHOLESTEROLEMIA 12/02/2007  . Anxiety state 12/02/2007  . Transient cerebral ischemia 12/02/2007  . Osteoarthritis 12/02/2007  . FIBROMYALGIA 12/02/2007  . Osteoporosis 12/02/2007    Allyson Sabal [facility] 04/25/2017, 5:08 PM  [place], [place], [address] Phone: [phone]   Fax:  [phone]  Name: HILA BOLDING MRN: [id number] Date of Birth: [birth year]  Manus Gunning, PT 04/25/17 5:10 PM

## 2017-04-26 ENCOUNTER — Other Ambulatory Visit: Payer: Self-pay | Admitting: Oncology

## 2017-04-26 ENCOUNTER — Ambulatory Visit
Admission: RE | Admit: 2017-04-26 | Discharge: 2017-04-26 | Disposition: A | Discharge status: Home / Self Care | Payer: Medicare Other | Source: Ambulatory Visit | Attending: Oncology | Admitting: Oncology

## 2017-04-26 DIAGNOSIS — R2231 Localized swelling, mass and lump, right upper limb: Secondary | ICD-10-CM

## 2017-04-26 DIAGNOSIS — Z171 Estrogen receptor negative status [ER-]: Principal | ICD-10-CM

## 2017-04-26 DIAGNOSIS — N6310 Unspecified lump in the right breast, unspecified quadrant: Secondary | ICD-10-CM | POA: Diagnosis not present

## 2017-04-26 DIAGNOSIS — C50411 Malignant neoplasm of upper-outer quadrant of right female breast: Secondary | ICD-10-CM

## 2017-04-26 DIAGNOSIS — N63 Unspecified lump in unspecified breast: Secondary | ICD-10-CM

## 2017-05-01 ENCOUNTER — Ambulatory Visit
Admission: RE | Admit: 2017-05-01 | Discharge: 2017-05-01 | Disposition: A | Discharge status: Home / Self Care | Payer: Medicare Other | Source: Ambulatory Visit | Attending: Pulmonary Disease | Admitting: Pulmonary Disease

## 2017-05-01 DIAGNOSIS — Z13820 Encounter for screening for osteoporosis: Secondary | ICD-10-CM | POA: Diagnosis not present

## 2017-05-01 DIAGNOSIS — Z780 Asymptomatic menopausal state: Secondary | ICD-10-CM | POA: Diagnosis not present

## 2017-05-01 DIAGNOSIS — M810 Age-related osteoporosis without current pathological fracture: Secondary | ICD-10-CM

## 2017-05-02 ENCOUNTER — Encounter: Payer: Medicare Other | Admitting: Physical Therapy

## 2017-05-02 ENCOUNTER — Ambulatory Visit: Payer: Medicare Other | Admitting: Pulmonary Disease

## 2017-05-03 ENCOUNTER — Telehealth: Payer: Self-pay | Admitting: Pulmonary Disease

## 2017-05-03 NOTE — Telephone Encounter (Signed)
Debra Space, MD sent to [name], CMA        Please notify patient>   BMD shows normal bone denisty w/ lowest Tscore -0.4 in left wrist...  REC to maintain Calcium, MVI, VitD supplementation & wt bearing exercise...   Spoke with pt and notified of results per Dr.Nadel. Pt verbalized understanding and denied any questions.

## 2017-05-04 ENCOUNTER — Encounter: Payer: Self-pay | Admitting: Physical Therapy

## 2017-05-04 ENCOUNTER — Ambulatory Visit: Payer: Medicare Other | Admitting: Physical Therapy

## 2017-05-04 DIAGNOSIS — G8929 Other chronic pain: Secondary | ICD-10-CM

## 2017-05-04 DIAGNOSIS — R293 Abnormal posture: Secondary | ICD-10-CM

## 2017-05-04 DIAGNOSIS — M25611 Stiffness of right shoulder, not elsewhere classified: Secondary | ICD-10-CM

## 2017-05-04 DIAGNOSIS — M6281 Muscle weakness (generalized): Secondary | ICD-10-CM | POA: Diagnosis not present

## 2017-05-04 DIAGNOSIS — M25511 Pain in right shoulder: Secondary | ICD-10-CM | POA: Diagnosis not present

## 2017-05-04 NOTE — Therapy (Signed)
[place], [place], [address] Phone: [phone]   Fax:  [phone]  Physical Therapy Treatment  Patient Details  Name: Debra Mcintyre MRN: [id number] Date of Birth: [birth year] Referring Provider: Magrinat  Encounter Date: 05/04/2017      PT End of Session - 05/04/17 1017    Visit Number 4   Number of Visits 9   Date for PT Re-Evaluation 05/17/17   PT Start Time 0935   PT Stop Time 1016   PT Time Calculation (min) 41 min   Activity Tolerance Patient tolerated treatment well   Behavior During Therapy [facility] for tasks assessed/performed      Past Medical History:  Diagnosis Date  . Breast cancer ([place]) 09/2012   right breast/right axillary lymph node t2,pn1, stage 11b, invasive ductal carcinma, grade 3, triple negative, with an mib-1 of 15%  . Cerebral aneurysm, nonruptured   . Cerebrovascular disease, unspecified   . Colonic polyp   . Dizziness    pt. states that she experiences this occassionally 05/03/15  . Headache    occassionally  . Heart murmur   . History of chemotherapy   . Hx of radiation therapy 05/27/13-08/13/13   right chest wall/right supraclavicular/axillary region 5220 cGy 29 sessions, right mastectomy/chest wall boost cGy 5 sessions  . Hypertension    Does not see a cardiologist  . Hypothyroidism   . Neuropathy   . Pure hypercholesterolemia   . Rosacea   . Seasonal allergies   . TIA (transient ischemic attack)   . Wears glasses     Past Surgical History:  Procedure Laterality Date  . BREAST BIOPSY Right 10/23/2012  . BREAST BIOPSY Right 10/08/2012  . COLONOSCOPY    . COLONOSCOPY W/ POLYPECTOMY    . ESOPHAGOGASTRODUODENOSCOPY    . Left middle cerebral artery angioplasty  2004   by [name] had 2 follow up occurances  . MASTECTOMY W/ SENTINEL NODE BIOPSY Right 04/23/2013   Procedure: RIGHT TOTAL MASTECTOMY WITH SENTINEL LYMPH NODE BIOPSY;  Surgeon: [name], MD;  Location: [place];  Service: General;  Laterality: Right;  . PORTACATH PLACEMENT N/A 11/05/2012   Procedure: INSERTION PORT-A-CATH;  Surgeon: [name], MD;  Location: [place];  Service: General;  Laterality: N/A;  . RADIOLOGY WITH ANESTHESIA N/A 05/04/2015   Procedure: MRI BRAIN WITH AND WITHOUT;  Surgeon: Medication Radiologist, MD;  Location: [place];  Service: Radiology;  Laterality: N/A;    There were no vitals filed for this visit.      Subjective Assessment - 05/04/17 0937    Subjective After last time I didn't have trouble with my right arm but my left shoulder was very painful but it went away. I have not tried the band exercises again.    Pertinent History Biopsy of the larger axillary lymph node on 10/07/2012 showed ([id number]) an invasive ductal carcinoma, triple negative, with an MIB-1 of 66%. Biopsy of the right breast the next day, SAA [id number]) showed invasive ductal carcinoma, grade 3. Breast MRI 10/14/2012 showed a total area of irregular enhancement in the right breast measuring up to 12 cm. MRI guided biopsy of an area in the upper inner quadrant of the right breast on 10/23/2012 showed ([id number] [id number]) ductal carcinoma in situ, with foci worrisome for invasion., 04/23/2013 R total mastectomy with SLNB, completed chemo and radiation   Patient Stated Goals to decrease the pain, improve ROM   Currently in Pain? No/denies   Pain Score  0-No pain                         [facility] Adult PT Treatment/Exercise - 05/04/17 0001      Shoulder Exercises: Supine   Horizontal ABduction Strengthening;Both;10 reps;Theraband   Theraband Level (Shoulder Horizontal ABduction) Level 1 (Yellow)   External Rotation Strengthening;Both;10 reps;Theraband   Theraband Level (Shoulder External Rotation) Level 1 (Yellow)   Flexion Strengthening;Both;10 reps;Theraband  narrow and wide grip   Theraband Level (Shoulder Flexion) Level 1 (Yellow)   Other  Supine Exercises D2 x 10 reps bilaterally with yellow theraband     Shoulder Exercises: Pulleys   Flexion 2 minutes   ABduction 2 minutes     Shoulder Exercises: Therapy Ball   Flexion --     Manual Therapy   Passive ROM to R shoulder in direction of flexion, abduction, and ER  ER very tight                        Long Term Clinic Goals - 04/25/17 1442      CC Long Term Goal  #1   Title Pt to report a 70% improvement in pain in R lateral trunk to allow improved comfort   Baseline 04/25/17- 30% improvement   Time 5   Period Weeks   Status On-going     CC Long Term Goal  #2   Title Pt to demonstrate 155 degrees of right shoulder flexion to allow her to reach items in cabinets   Baseline 131, 04/25/17- 134 degrees   Time 5   Period Weeks   Status On-going     CC Long Term Goal  #3   Title Pt to demonstrate 165 degrees of right shoulder abduction to allow her to reach items out to sides   Baseline 110, 04/25/17- 114 degrees   Time 5   Period Weeks   Status On-going     CC Long Term Goal  #4   Title Pt to demonstrate 60 degrees of right shoulder IR to allow pt to return to prior level of function   Baseline 38, 04/25/17- 53 degrees with p!   Time 5   Period Weeks   Status On-going     CC Long Term Goal  #5   Title Pt to be independent in a home exercise program for continued strengthening and stretching   Time 5   Period Weeks   Status On-going            Plan - 05/04/17 1017    Clinical Impression Statement Added pulleys today. Pt had left shoulder pain after doing band exercises last session and has not practiced at home since. Re educated pt in proper technique for supine scapular exercises. Pt required moderate verbal cues to perform exercise correctly today. She was possibly having impingement due to performing exercise incorrectly. Pt instructed to begin these again at home on Sunday with proper form. Performed PROM to R shoulder today with  abduction and ER being more tight.    Rehab Potential Good   Clinical Impairments Affecting Rehab Potential hx of radiation   PT Frequency 2x / week   PT Duration --  5 weeks   PT Treatment/Interventions ADLs/Self Care Home Management;Patient/family education;Manual techniques;Therapeutic exercise;Manual lymph drainage;Scar mobilization;Passive range of motion;Taping   PT Next Visit Plan do gentle AA/A/PROM, assess if pt had pain with supine scap, pulleys,   Consulted and Agree with Plan  of Care Patient      Patient will benefit from skilled therapeutic intervention in order to improve the following deficits and impairments:  Increased edema, Decreased knowledge of precautions, Impaired UE functional use, Decreased strength, Decreased range of motion, Decreased scar mobility, Postural dysfunction, Pain, Increased fascial restricitons  Visit Diagnosis: Stiffness of right shoulder, not elsewhere classified  Chronic right shoulder pain  Abnormal posture  Muscle weakness (generalized)     Problem List Patient Active Problem List   Diagnosis Date Noted  . Malignant neoplasm of upper-outer quadrant of right breast in female, estrogen receptor negative ([place]) 05/13/2013  . Anemia, unspecified 02/24/2013  . GERD (gastroesophageal reflux disease) 12/16/2012  . Asthmatic bronchitis 04/21/2011  . INTRACRANIAL ANEURYSM 08/28/2009  . Cerebrovascular disease 08/28/2009  . COLONIC POLYPS 08/26/2009  . Essential hypertension 05/12/2008  . HEMORRHOIDS 05/12/2008  . Hypothyroidism 12/02/2007  . HYPERCHOLESTEROLEMIA 12/02/2007  . Anxiety state 12/02/2007  . Transient cerebral ischemia 12/02/2007  . Osteoarthritis 12/02/2007  . FIBROMYALGIA 12/02/2007  . Osteoporosis 12/02/2007    Allyson Sabal [facility] 05/04/2017, 12:03 PM  [place], [place], [address] Phone: [phone]   Fax:  [phone]  Name: Debra Mcintyre MRN: [id number] Date of Birth: [birth year]  Manus Gunning, PT 05/04/17 12:03 PM

## 2017-05-07 ENCOUNTER — Ambulatory Visit: Payer: Medicare Other | Admitting: Physical Therapy

## 2017-05-07 DIAGNOSIS — G8929 Other chronic pain: Secondary | ICD-10-CM

## 2017-05-07 DIAGNOSIS — M6281 Muscle weakness (generalized): Secondary | ICD-10-CM | POA: Diagnosis not present

## 2017-05-07 DIAGNOSIS — M25511 Pain in right shoulder: Secondary | ICD-10-CM | POA: Diagnosis not present

## 2017-05-07 DIAGNOSIS — R293 Abnormal posture: Secondary | ICD-10-CM | POA: Diagnosis not present

## 2017-05-07 DIAGNOSIS — M25611 Stiffness of right shoulder, not elsewhere classified: Secondary | ICD-10-CM

## 2017-05-07 NOTE — Therapy (Signed)
[place], [place], [address] Phone: [phone]   Fax:  [phone]  Physical Therapy Treatment  Patient Details  Name: Debra Mcintyre MRN: [id number] Date of Birth: [birth year] Referring Provider: Magrinat  Encounter Date: 05/07/2017      PT End of Session - 05/07/17 1716    Visit Number 5   Number of Visits 9   Date for PT Re-Evaluation 05/17/17   PT Start Time 5631   PT Stop Time 1600   PT Time Calculation (min) 45 min   Activity Tolerance Patient tolerated treatment well   Behavior During Therapy [facility] for tasks assessed/performed      Past Medical History:  Diagnosis Date  . Breast cancer ([place]) 09/2012   right breast/right axillary lymph node t2,pn1, stage 11b, invasive ductal carcinma, grade 3, triple negative, with an mib-1 of 15%  . Cerebral aneurysm, nonruptured   . Cerebrovascular disease, unspecified   . Colonic polyp   . Dizziness    pt. states that she experiences this occassionally 05/03/15  . Headache    occassionally  . Heart murmur   . History of chemotherapy   . Hx of radiation therapy 05/27/13-08/13/13   right chest wall/right supraclavicular/axillary region 5220 cGy 29 sessions, right mastectomy/chest wall boost cGy 5 sessions  . Hypertension    Does not see a cardiologist  . Hypothyroidism   . Neuropathy   . Pure hypercholesterolemia   . Rosacea   . Seasonal allergies   . TIA (transient ischemic attack)   . Wears glasses     Past Surgical History:  Procedure Laterality Date  . BREAST BIOPSY Right 10/23/2012  . BREAST BIOPSY Right 10/08/2012  . COLONOSCOPY    . COLONOSCOPY W/ POLYPECTOMY    . ESOPHAGOGASTRODUODENOSCOPY    . Left middle cerebral artery angioplasty  2004   by [name] had 2 follow up occurances  . MASTECTOMY W/ SENTINEL NODE BIOPSY Right 04/23/2013   Procedure: RIGHT TOTAL MASTECTOMY WITH SENTINEL LYMPH NODE BIOPSY;  Surgeon: [name], MD;  Location: [place];  Service: General;  Laterality: Right;  . PORTACATH PLACEMENT N/A 11/05/2012   Procedure: INSERTION PORT-A-CATH;  Surgeon: [name], MD;  Location: [place];  Service: General;  Laterality: N/A;  . RADIOLOGY WITH ANESTHESIA N/A 05/04/2015   Procedure: MRI BRAIN WITH AND WITHOUT;  Surgeon: Medication Radiologist, MD;  Location: [place];  Service: Radiology;  Laterality: N/A;    There were no vitals filed for this visit.                       [facility] Adult PT Treatment/Exercise - 05/07/17 0001      Shoulder Exercises: Supine   Protraction AROM;Right;10 reps   External Rotation AAROM;Right;10 reps   Other Supine Exercises meeks decompression      Manual Therapy   Manual Therapy Soft tissue mobilization   Soft tissue mobilization gentle soft tissue work to right pec area and posterior axilla wiith gentle stretch   Passive ROM to R shoulder in direction of flexion, abduction, and ER  ER very tight                        Long Term Clinic Goals - 04/25/17 1442      CC Long Term Goal  #1   Title Pt to report a 70% improvement in pain in R lateral trunk to allow improved comfort  Baseline 04/25/17- 30% improvement   Time 5   Period Weeks   Status On-going     CC Long Term Goal  #2   Title Pt to demonstrate 155 degrees of right shoulder flexion to allow her to reach items in cabinets   Baseline 131, 04/25/17- 134 degrees   Time 5   Period Weeks   Status On-going     CC Long Term Goal  #3   Title Pt to demonstrate 165 degrees of right shoulder abduction to allow her to reach items out to sides   Baseline 110, 04/25/17- 114 degrees   Time 5   Period Weeks   Status On-going     CC Long Term Goal  #4   Title Pt to demonstrate 60 degrees of right shoulder IR to allow pt to return to prior level of function   Baseline 38, 04/25/17- 53 degrees with p!   Time 5   Period Weeks   Status On-going      CC Long Term Goal  #5   Title Pt to be independent in a home exercise program for continued strengthening and stretching   Time 5   Period Weeks   Status On-going            Plan - 05/07/17 1717    Clinical Impression Statement upgraded home program to include meeks decompression and did manual work for stretching to right shoulder.  Pt states she felt better after treatment    PT Next Visit Plan do gentle AA/A/PROM, assess if pt had pain with supine scap, pulleys,  add soft tissue work for pain managment    Consulted and Agree with Plan of Care Patient      Patient will benefit from skilled therapeutic intervention in order to improve the following deficits and impairments:     Visit Diagnosis: Stiffness of right shoulder, not elsewhere classified  Chronic right shoulder pain  Abnormal posture  Muscle weakness (generalized)     Problem List Patient Active Problem List   Diagnosis Date Noted  . Malignant neoplasm of upper-outer quadrant of right breast in female, estrogen receptor negative ([place]) 05/13/2013  . Anemia, unspecified 02/24/2013  . GERD (gastroesophageal reflux disease) 12/16/2012  . Asthmatic bronchitis 04/21/2011  . INTRACRANIAL ANEURYSM 08/28/2009  . Cerebrovascular disease 08/28/2009  . COLONIC POLYPS 08/26/2009  . Essential hypertension 05/12/2008  . HEMORRHOIDS 05/12/2008  . Hypothyroidism 12/02/2007  . HYPERCHOLESTEROLEMIA 12/02/2007  . Anxiety state 12/02/2007  . Transient cerebral ischemia 12/02/2007  . Osteoarthritis 12/02/2007  . FIBROMYALGIA 12/02/2007  . Osteoporosis 12/02/2007   Debra Mcintyre. Owens Shark PT  Norwood Levo 05/07/2017, 5:19 PM  [place], [place], [address] Phone: [phone]   Fax:  [phone]  Name: [name] MRN: [id number] Date of Birth: [birth year]

## 2017-05-07 NOTE — Patient Instructions (Signed)
1. Decompression Exercise     Cancer Rehab [phone]    Lie on back on firm surface, knees bent, feet flat, arms turned up, out to sides, backs of hands down. Time _5-15__ minutes. Surface: floor   2. Shoulder Press    Start in Decompression Exercise position. Press shoulders downward towards supporting surface. Hold __2-3__ seconds while counting out loud. Repeat _3-5___ times. Do _1-2___ times per day.   3. Head Press    Bring cervical spine (neck) into neutral position (by either tucking the chin towards the chest or tilting the chin upward). Feel weight on back of head. Press head downward into supporting surface.    Hold _2-3__ seconds. Repeat _3-5__ times. Do _1-2__ times per day.   4. Leg Lengthener    Straighten one leg. Pull toes AND forefoot toward knee, extend heel. Lengthen leg by pulling pelvis away from ribs. Hold _2-3__ seconds. Relax. Repeat __4-6__ times. Do other leg.  Surface: floor   5. Leg Press    Straighten one leg down to floor keeping leg aligned with hip. Pull toes AND forefoot toward knee; extend heel.  Press entire leg downward (as if pressing leg into sandy beach). DO NOT BEND KNEE. Hold _2-3__ seconds. Do __4-6__ times. Repeat with other leg.

## 2017-05-08 ENCOUNTER — Other Ambulatory Visit: Payer: Self-pay | Admitting: Oncology

## 2017-05-08 DIAGNOSIS — Z853 Personal history of malignant neoplasm of breast: Secondary | ICD-10-CM

## 2017-05-09 ENCOUNTER — Ambulatory Visit: Payer: Medicare Other | Admitting: Physical Therapy

## 2017-05-09 ENCOUNTER — Encounter: Payer: Self-pay | Admitting: Physical Therapy

## 2017-05-09 DIAGNOSIS — R293 Abnormal posture: Secondary | ICD-10-CM | POA: Diagnosis not present

## 2017-05-09 DIAGNOSIS — M6281 Muscle weakness (generalized): Secondary | ICD-10-CM | POA: Diagnosis not present

## 2017-05-09 DIAGNOSIS — M25611 Stiffness of right shoulder, not elsewhere classified: Secondary | ICD-10-CM | POA: Diagnosis not present

## 2017-05-09 DIAGNOSIS — G8929 Other chronic pain: Secondary | ICD-10-CM

## 2017-05-09 DIAGNOSIS — M25511 Pain in right shoulder: Secondary | ICD-10-CM

## 2017-05-09 NOTE — Therapy (Signed)
[place], [place], [address] Phone: [phone]   Fax:  [phone]  Physical Therapy Treatment  Patient Details  Name: Debra Mcintyre MRN: [id number] Date of Birth: November 25, 1940 Referring Provider: Magrinat  Encounter Date: 05/09/2017      PT End of Session - 05/09/17 1606    Visit Number 6   Number of Visits 9   Date for PT Re-Evaluation 05/17/17   PT Start Time 9163   PT Stop Time 1605   PT Time Calculation (min) 44 min   Activity Tolerance Patient tolerated treatment well   Behavior During Therapy [facility] for tasks assessed/performed      Past Medical History:  Diagnosis Date  . Breast cancer ([place]) 09/2012   right breast/right axillary lymph node t2,pn1, stage 11b, invasive ductal carcinma, grade 3, triple negative, with an mib-1 of 15%  . Cerebral aneurysm, nonruptured   . Cerebrovascular disease, unspecified   . Colonic polyp   . Dizziness    pt. states that she experiences this occassionally 05/03/15  . Headache    occassionally  . Heart murmur   . History of chemotherapy   . Hx of radiation therapy 05/27/13-08/13/13   right chest wall/right supraclavicular/axillary region 5220 cGy 29 sessions, right mastectomy/chest wall boost cGy 5 sessions  . Hypertension    Does not see a cardiologist  . Hypothyroidism   . Neuropathy   . Pure hypercholesterolemia   . Rosacea   . Seasonal allergies   . TIA (transient ischemic attack)   . Wears glasses     Past Surgical History:  Procedure Laterality Date  . BREAST BIOPSY Right 10/23/2012  . BREAST BIOPSY Right 10/08/2012  . COLONOSCOPY    . COLONOSCOPY W/ POLYPECTOMY    . ESOPHAGOGASTRODUODENOSCOPY    . Left middle cerebral artery angioplasty  2004   by [name] had 2 follow up occurances  . MASTECTOMY W/ SENTINEL NODE BIOPSY Right 04/23/2013   Procedure: RIGHT TOTAL MASTECTOMY WITH SENTINEL LYMPH NODE BIOPSY;  Surgeon: [name], MD;  Location: [place];  Service: General;  Laterality: Right;  . PORTACATH PLACEMENT N/A 11/05/2012   Procedure: INSERTION PORT-A-CATH;  Surgeon: [name], MD;  Location: [place];  Service: General;  Laterality: N/A;  . RADIOLOGY WITH ANESTHESIA N/A 05/04/2015   Procedure: MRI BRAIN WITH AND WITHOUT;  Surgeon: Medication Radiologist, MD;  Location: [place];  Service: Radiology;  Laterality: N/A;    There were no vitals filed for this visit.      Subjective Assessment - 05/09/17 1523    Subjective I am feeling pretty well today. I have not tried the band exercises because I have been so busy.    Pertinent History Biopsy of the larger axillary lymph node on 10/07/2012 showed ([id number]) an invasive ductal carcinoma, triple negative, with an MIB-1 of 66%. Biopsy of the right breast the next day, [id number] [id number]) showed invasive ductal carcinoma, grade 3. Breast MRI 10/14/2012 showed a total area of irregular enhancement in the right breast measuring up to 12 cm. MRI guided biopsy of an area in the upper inner quadrant of the right breast on 10/23/2012 showed ([id number] [id number]) ductal carcinoma in situ, with foci worrisome for invasion., 04/23/2013 R total mastectomy with SLNB, completed chemo and radiation   Patient Stated Goals to decrease the pain, improve ROM   Currently in Pain? No/denies   Pain Score 0-No pain  [facility] Adult PT Treatment/Exercise - 05/09/17 0001      Shoulder Exercises: Supine   Horizontal ABduction Strengthening;Both;10 reps;Theraband   Theraband Level (Shoulder Horizontal ABduction) Level 1 (Yellow)   External Rotation Strengthening;Both;10 reps;Theraband   Theraband Level (Shoulder External Rotation) Level 1 (Yellow)   Flexion Strengthening;Both;10 reps;Theraband  narrow and wide grip   Theraband Level (Shoulder Flexion) Level 1 (Yellow)   Other Supine Exercises D2 x 10 reps bilaterally with yellow  theraband     Manual Therapy   Manual Therapy Soft tissue mobilization   Soft tissue mobilization gentle soft tissue work to right pec area and posterior axilla wiith gentle stretch   Passive ROM to R shoulder in direction of flexion, abduction  ER very tight                        Long Term Clinic Goals - 04/25/17 1442      CC Long Term Goal  #1   Title Pt to report a 70% improvement in pain in R lateral trunk to allow improved comfort   Baseline 04/25/17- 30% improvement   Time 5   Period Weeks   Status On-going     CC Long Term Goal  #2   Title Pt to demonstrate 155 degrees of right shoulder flexion to allow her to reach items in cabinets   Baseline 131, 04/25/17- 134 degrees   Time 5   Period Weeks   Status On-going     CC Long Term Goal  #3   Title Pt to demonstrate 165 degrees of right shoulder abduction to allow her to reach items out to sides   Baseline 110, 04/25/17- 114 degrees   Time 5   Period Weeks   Status On-going     CC Long Term Goal  #4   Title Pt to demonstrate 60 degrees of right shoulder IR to allow pt to return to prior level of function   Baseline 38, 04/25/17- 53 degrees with p!   Time 5   Period Weeks   Status On-going     CC Long Term Goal  #5   Title Pt to be independent in a home exercise program for continued strengthening and stretching   Time 5   Period Weeks   Status On-going            Plan - 05/09/17 1607    Clinical Impression Statement Re educated pt in supine scap series since pt has not completed these at home. Pt still with tightness in PROM due to R pec tightness. Performed soft tissue mobilization to this area with prolonged passive stretching in direction of flexion and abduction. Pt states her shoulder is feeling better at end of session.    Rehab Potential Good   Clinical Impairments Affecting Rehab Potential hx of radiation   PT Frequency 2x / week   PT Duration --  5 weeks   PT Treatment/Interventions  ADLs/Self Care Home Management;Patient/family education;Manual techniques;Therapeutic exercise;Manual lymph drainage;Scar mobilization;Passive range of motion;Taping   PT Next Visit Plan do gentle AA/A/PROM, assess if pt had pain with supine scap, pulleys,  add soft tissue work for pain managment    Consulted and Agree with Plan of Care Patient      Patient will benefit from skilled therapeutic intervention in order to improve the following deficits and impairments:  Increased edema, Decreased knowledge of precautions, Impaired UE functional use, Decreased strength, Decreased range of motion, Decreased scar mobility, Postural  dysfunction, Pain, Increased fascial restricitons  Visit Diagnosis: Stiffness of right shoulder, not elsewhere classified  Chronic right shoulder pain  Abnormal posture  Muscle weakness (generalized)     Problem List Patient Active Problem List   Diagnosis Date Noted  . Malignant neoplasm of upper-outer quadrant of right breast in female, estrogen receptor negative ([place]) 05/13/2013  . Anemia, unspecified 02/24/2013  . GERD (gastroesophageal reflux disease) 12/16/2012  . Asthmatic bronchitis 04/21/2011  . INTRACRANIAL ANEURYSM 08/28/2009  . Cerebrovascular disease 08/28/2009  . COLONIC POLYPS 08/26/2009  . Essential hypertension 05/12/2008  . HEMORRHOIDS 05/12/2008  . Hypothyroidism 12/02/2007  . HYPERCHOLESTEROLEMIA 12/02/2007  . Anxiety state 12/02/2007  . Transient cerebral ischemia 12/02/2007  . Osteoarthritis 12/02/2007  . FIBROMYALGIA 12/02/2007  . Osteoporosis 12/02/2007    Allyson Sabal [facility] 05/09/2017, 4:08 PM  [place], [place], [address] Phone: [phone]   Fax:  [phone]  Name: Debra Mcintyre MRN: [id number] Date of Birth: December 06, 1940  Manus Gunning, PT 05/09/17 4:09 PM

## 2017-05-14 ENCOUNTER — Ambulatory Visit: Payer: Medicare Other | Attending: Oncology | Admitting: Physical Therapy

## 2017-05-14 ENCOUNTER — Encounter: Payer: Self-pay | Admitting: Physical Therapy

## 2017-05-14 DIAGNOSIS — M25611 Stiffness of right shoulder, not elsewhere classified: Secondary | ICD-10-CM | POA: Diagnosis not present

## 2017-05-14 DIAGNOSIS — M25511 Pain in right shoulder: Secondary | ICD-10-CM | POA: Insufficient documentation

## 2017-05-14 DIAGNOSIS — G8929 Other chronic pain: Secondary | ICD-10-CM | POA: Diagnosis not present

## 2017-05-14 DIAGNOSIS — R293 Abnormal posture: Secondary | ICD-10-CM | POA: Diagnosis not present

## 2017-05-14 NOTE — Therapy (Signed)
[place], [place], [address] Phone: [phone]   Fax:  [phone]  Physical Therapy Treatment  Patient Details  Name: Debra Mcintyre MRN: [id number] Date of Birth: 1940/12/20 Referring Provider: Magrinat  Encounter Date: 05/14/2017      PT End of Session - 05/14/17 1455    Visit Number 7   Number of Visits 9   Date for PT Re-Evaluation 05/17/17   PT Start Time 1400   PT Stop Time 1445   PT Time Calculation (min) 45 min   Activity Tolerance Patient tolerated treatment well   Behavior During Therapy [facility] for tasks assessed/performed      Past Medical History:  Diagnosis Date  . Breast cancer ([place]) 09/2012   right breast/right axillary lymph node t2,pn1, stage 11b, invasive ductal carcinma, grade 3, triple negative, with an mib-1 of 15%  . Cerebral aneurysm, nonruptured   . Cerebrovascular disease, unspecified   . Colonic polyp   . Dizziness    pt. states that she experiences this occassionally 05/03/15  . Headache    occassionally  . Heart murmur   . History of chemotherapy   . Hx of radiation therapy 05/27/13-08/13/13   right chest wall/right supraclavicular/axillary region 5220 cGy 29 sessions, right mastectomy/chest wall boost cGy 5 sessions  . Hypertension    Does not see a cardiologist  . Hypothyroidism   . Neuropathy   . Pure hypercholesterolemia   . Rosacea   . Seasonal allergies   . TIA (transient ischemic attack)   . Wears glasses     Past Surgical History:  Procedure Laterality Date  . BREAST BIOPSY Right 10/23/2012  . BREAST BIOPSY Right 10/08/2012  . COLONOSCOPY    . COLONOSCOPY W/ POLYPECTOMY    . ESOPHAGOGASTRODUODENOSCOPY    . Left middle cerebral artery angioplasty  2004   by [name] had 2 follow up occurances  . MASTECTOMY W/ SENTINEL NODE BIOPSY Right 04/23/2013   Procedure: RIGHT TOTAL MASTECTOMY WITH SENTINEL LYMPH NODE BIOPSY;  Surgeon: [name], MD;  Location: [place];  Service: General;  Laterality: Right;  . PORTACATH PLACEMENT N/A 11/05/2012   Procedure: INSERTION PORT-A-CATH;  Surgeon: [name], MD;  Location: [place];  Service: General;  Laterality: N/A;  . RADIOLOGY WITH ANESTHESIA N/A 05/04/2015   Procedure: MRI BRAIN WITH AND WITHOUT;  Surgeon: Medication Radiologist, MD;  Location: [place];  Service: Radiology;  Laterality: N/A;    There were no vitals filed for this visit.      Subjective Assessment - 05/14/17 1400    Subjective I have tried the band exercises but not like I should. I did not have any pain with them.    Pertinent History Biopsy of the larger axillary lymph node on 10/07/2012 showed ([id number]) an invasive ductal carcinoma, triple negative, with an MIB-1 of 66%. Biopsy of the right breast the next day, [id number] [id number]) showed invasive ductal carcinoma, grade 3. Breast MRI 10/14/2012 showed a total area of irregular enhancement in the right breast measuring up to 12 cm. MRI guided biopsy of an area in the upper inner quadrant of the right breast on 10/23/2012 showed ([id number] [id number]) ductal carcinoma in situ, with foci worrisome for invasion., 04/23/2013 R total mastectomy with SLNB, completed chemo and radiation   Patient Stated Goals to decrease the pain, improve ROM   Currently in Pain? No/denies   Pain Score 0-No pain  [facility] PT Assessment - 05/14/17 0001      AROM   Right Shoulder Flexion 149 Degrees   Right Shoulder ABduction 131 Degrees   Right Shoulder Internal Rotation 56 Degrees                     [facility] Adult PT Treatment/Exercise - 05/14/17 0001      Manual Therapy   Soft tissue mobilization gentle soft tissue work to right pec area and posterior axilla wiith gentle stretch   Passive ROM to R shoulder in direction of flexion, abduction, ER, IR  ER very tight                        Long Term Clinic Goals - 05/14/17 1454       CC Long Term Goal  #1   Title Pt to report a 70% improvement in pain in R lateral trunk to allow improved comfort   Baseline 04/25/17- 30% improvement   Time 5   Period Weeks   Status On-going     CC Long Term Goal  #2   Title Pt to demonstrate 155 degrees of right shoulder flexion to allow her to reach items in cabinets   Baseline 131, 04/25/17- 134 degrees, 05/14/17- 149 degrees   Time 5   Period Weeks   Status On-going     CC Long Term Goal  #3   Title Pt to demonstrate 165 degrees of right shoulder abduction to allow her to reach items out to sides   Baseline 110, 04/25/17- 114 degrees, 05/14/17- 131 degrees   Time 5   Period Weeks   Status On-going     CC Long Term Goal  #4   Title Pt to demonstrate 60 degrees of right shoulder IR to allow pt to return to prior level of function   Baseline 38, 04/25/17- 53 degrees with p!, 05/14/17- 56 degrees   Time 5   Period Weeks   Status On-going     CC Long Term Goal  #5   Title Pt to be independent in a home exercise program for continued strengthening and stretching   Time 5   Period Weeks   Status On-going            Plan - 05/14/17 1455    Clinical Impression Statement Continued with PROM and prolonged stretching to right shoulder today. Reassessed goals in therapy. Pt has made progress towards her ROM goals today and is continuing to demonstrate gains in ROM.    Rehab Potential Good   Clinical Impairments Affecting Rehab Potential hx of radiation   PT Frequency 2x / week   PT Duration --  5 weeks   PT Treatment/Interventions ADLs/Self Care Home Management;Patient/family education;Manual techniques;Therapeutic exercise;Manual lymph drainage;Scar mobilization;Passive range of motion;Taping   PT Next Visit Plan do gentle AA/A/PROM, pulleys,  add soft tissue work for pain managment    Consulted and Agree with Plan of Care Patient      Patient will benefit from skilled therapeutic intervention in order to improve the  following deficits and impairments:  Increased edema, Decreased knowledge of precautions, Impaired UE functional use, Decreased strength, Decreased range of motion, Decreased scar mobility, Postural dysfunction, Pain, Increased fascial restricitons  Visit Diagnosis: Stiffness of right shoulder, not elsewhere classified  Chronic right shoulder pain     Problem List Patient Active Problem List   Diagnosis Date Noted  . Malignant neoplasm of upper-outer quadrant of right  breast in female, estrogen receptor negative ([place]) 05/13/2013  . Anemia, unspecified 02/24/2013  . GERD (gastroesophageal reflux disease) 12/16/2012  . Asthmatic bronchitis 04/21/2011  . INTRACRANIAL ANEURYSM 08/28/2009  . Cerebrovascular disease 08/28/2009  . COLONIC POLYPS 08/26/2009  . Essential hypertension 05/12/2008  . HEMORRHOIDS 05/12/2008  . Hypothyroidism 12/02/2007  . HYPERCHOLESTEROLEMIA 12/02/2007  . Anxiety state 12/02/2007  . Transient cerebral ischemia 12/02/2007  . Osteoarthritis 12/02/2007  . FIBROMYALGIA 12/02/2007  . Osteoporosis 12/02/2007    Allyson Sabal [facility] 05/14/2017, 2:57 PM  [place], [place], [address] Phone: [phone]   Fax:  [phone]  Name: Debra Mcintyre MRN: [id number] Date of Birth: 1941-04-06  Manus Gunning, PT 05/14/17 2:57 PM

## 2017-05-16 ENCOUNTER — Encounter: Payer: Self-pay | Admitting: Physical Therapy

## 2017-05-16 ENCOUNTER — Ambulatory Visit: Payer: Medicare Other | Admitting: Physical Therapy

## 2017-05-16 ENCOUNTER — Encounter: Payer: Medicare Other | Admitting: Physical Therapy

## 2017-05-16 DIAGNOSIS — R293 Abnormal posture: Secondary | ICD-10-CM | POA: Diagnosis not present

## 2017-05-16 DIAGNOSIS — G8929 Other chronic pain: Secondary | ICD-10-CM

## 2017-05-16 DIAGNOSIS — M25511 Pain in right shoulder: Secondary | ICD-10-CM | POA: Diagnosis not present

## 2017-05-16 DIAGNOSIS — M25611 Stiffness of right shoulder, not elsewhere classified: Secondary | ICD-10-CM | POA: Diagnosis not present

## 2017-05-16 NOTE — Therapy (Signed)
[place], [place], [address] Phone: [phone]   Fax:  [phone]  Physical Therapy Treatment  Patient Details  Name: Debra Mcintyre MRN: [id number] Date of Birth: 11/03/1940 Referring Provider: Magrinat  Encounter Date: 05/16/2017      PT End of Session - 05/16/17 1438    Visit Number 8   Number of Visits 9   Date for PT Re-Evaluation 05/17/17   PT Start Time 7253   PT Stop Time 1435   PT Time Calculation (min) 43 min   Activity Tolerance Patient tolerated treatment well   Behavior During Therapy [facility] for tasks assessed/performed      Past Medical History:  Diagnosis Date  . 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 Cerebral aneurysm, nonruptured   . Cerebrovascular disease, unspecified   . Colonic polyp   . Dizziness    pt. states that she experiences this occassionally 05/03/15  . Headache    occassionally  . Heart murmur   . History of chemotherapy   . Hx of radiation therapy 05/27/13-08/13/13   right chest wall/right supraclavicular/axillary region 5220 cGy 29 sessions, right mastectomy/chest wall boost cGy 5 sessions  . Hypertension    Does not see a cardiologist  . Hypothyroidism   . Neuropathy   . Pure hypercholesterolemia   . Rosacea   . Seasonal allergies   . TIA (transient ischemic attack)   . Wears glasses     Past Surgical History:  Procedure Laterality Date  . BREAST BIOPSY Right 10/23/2012  . BREAST BIOPSY Right 10/08/2012  . COLONOSCOPY    . COLONOSCOPY W/ POLYPECTOMY    . ESOPHAGOGASTRODUODENOSCOPY    . Left middle cerebral artery angioplasty  2004   by [name] had 2 follow up occurances  . MASTECTOMY W/ SENTINEL NODE BIOPSY Right 04/23/2013   Procedure: RIGHT TOTAL MASTECTOMY WITH SENTINEL LYMPH NODE BIOPSY;  Surgeon: [name], MD;  Location: [place];  Service: General;  Laterality: Right;  . PORTACATH PLACEMENT N/A 11/05/2012   Procedure: INSERTION PORT-A-CATH;  Surgeon: [name], MD;  Location: [place];  Service: General;  Laterality: N/A;  . RADIOLOGY WITH ANESTHESIA N/A 05/04/2015   Procedure: MRI BRAIN WITH AND WITHOUT;  Surgeon: Medication Radiologist, MD;  Location: [place];  Service: Radiology;  Laterality: N/A;    There were no vitals filed for this visit.      Subjective Assessment - 05/16/17 1353    Subjective yesterday I went to the [facility] and did core exercises. After last time I was a little sore but I could use my arm a lot better. The stretching helps so much.    Pertinent History Biopsy of the larger axillary lymph node on 10/07/2012 showed ([id number]) an invasive ductal carcinoma, triple negative, with an MIB-1 of 66%. Biopsy of the right breast the next day, SAA [id number]) showed invasive ductal carcinoma, grade 3. Breast MRI 10/14/2012 showed a total area of irregular enhancement in the right breast measuring up to 12 cm. MRI guided biopsy of an area in the upper inner quadrant of the right breast on 10/23/2012 showed ([id number]) ductal carcinoma in situ, with foci worrisome for invasion., 04/23/2013 R total mastectomy with SLNB, completed chemo and radiation   Patient Stated Goals to decrease the pain, improve ROM   Currently in Pain? No/denies  Pain Score 0-No pain                         [facility] Adult PT Treatment/Exercise - 05/16/17 0001      Shoulder Exercises: Pulleys   Flexion 2 minutes   ABduction 2 minutes     Manual Therapy   Manual Therapy Passive ROM;Myofascial release   Myofascial Release to right axilla in area of cording    Passive ROM to R shoulder in direction of flexion, abduction, ER, IR                        Long Term Clinic Goals - 05/14/17 1454      CC Long Term Goal  #1   Title Pt to report a 70%  improvement in pain in R lateral trunk to allow improved comfort   Baseline 04/25/17- 30% improvement   Time 5   Period Weeks   Status On-going     CC Long Term Goal  #2   Title Pt to demonstrate 155 degrees of right shoulder flexion to allow her to reach items in cabinets   Baseline 131, 04/25/17- 134 degrees, 05/14/17- 149 degrees   Time 5   Period Weeks   Status On-going     CC Long Term Goal  #3   Title Pt to demonstrate 165 degrees of right shoulder abduction to allow her to reach items out to sides   Baseline 110, 04/25/17- 114 degrees, 05/14/17- 131 degrees   Time 5   Period Weeks   Status On-going     CC Long Term Goal  #4   Title Pt to demonstrate 60 degrees of right shoulder IR to allow pt to return to prior level of function   Baseline 38, 04/25/17- 53 degrees with p!, 05/14/17- 56 degrees   Time 5   Period Weeks   Status On-going     CC Long Term Goal  #5   Title Pt to be independent in a home exercise program for continued strengthening and stretching   Time 5   Period Weeks   Status On-going            Plan - 05/16/17 1452    Clinical Impression Statement Pt did very well still PROM last session and felt her arm was more functional since that session. Continued with this today. Patient will be on vacation next week but plans on her doing her theraband exercises while she is gone.    Rehab Potential Good   Clinical Impairments Affecting Rehab Potential hx of radiation   PT Frequency 2x / week   PT Duration --  5 weeks   PT Treatment/Interventions ADLs/Self Care Home Management;Patient/family education;Manual techniques;Therapeutic exercise;Manual lymph drainage;Scar mobilization;Passive range of motion;Taping   PT Next Visit Plan do gentle AA/A/PROM, pulleys,  add soft tissue work for pain managment    Consulted and Agree with Plan of Care Patient      Patient will benefit from skilled therapeutic intervention in order to improve the following deficits and  impairments:  Increased edema, Decreased knowledge of precautions, Impaired UE functional use, Decreased strength, Decreased range of motion, Decreased scar mobility, Postural dysfunction, Pain, Increased fascial restricitons  Visit Diagnosis: Stiffness of right shoulder, not elsewhere classified  Chronic right shoulder pain  Abnormal posture     Problem List Patient Active Problem List   Diagnosis Date Noted  . Malignant neoplasm of upper-outer quadrant of right  breast in female, estrogen receptor negative ([place]) 05/13/2013  . Anemia, unspecified 02/24/2013  . GERD (gastroesophageal reflux disease) 12/16/2012  . Asthmatic bronchitis 04/21/2011  . INTRACRANIAL ANEURYSM 08/28/2009  . Cerebrovascular disease 08/28/2009  . COLONIC POLYPS 08/26/2009  . Essential hypertension 05/12/2008  . HEMORRHOIDS 05/12/2008  . Hypothyroidism 12/02/2007  . HYPERCHOLESTEROLEMIA 12/02/2007  . Anxiety state 12/02/2007  . Transient cerebral ischemia 12/02/2007  . Osteoarthritis 12/02/2007  . FIBROMYALGIA 12/02/2007  . Osteoporosis 12/02/2007    Allyson Sabal [facility] 05/16/2017, 2:53 PM  [place], [place], [address] Phone: [phone]   Fax:  [phone]  Name: Debra Mcintyre MRN: [id number] Date of Birth: [birth year]  Manus Gunning, PT 05/16/17 2:54 PM

## 2017-05-21 ENCOUNTER — Ambulatory Visit: Payer: Medicare Other | Admitting: Physical Therapy

## 2017-05-30 ENCOUNTER — Ambulatory Visit: Payer: Medicare Other | Admitting: Physical Therapy

## 2017-05-30 ENCOUNTER — Encounter: Payer: Self-pay | Admitting: Physical Therapy

## 2017-05-30 DIAGNOSIS — G8929 Other chronic pain: Secondary | ICD-10-CM | POA: Diagnosis not present

## 2017-05-30 DIAGNOSIS — M25511 Pain in right shoulder: Secondary | ICD-10-CM | POA: Diagnosis not present

## 2017-05-30 DIAGNOSIS — R293 Abnormal posture: Secondary | ICD-10-CM | POA: Diagnosis not present

## 2017-05-30 DIAGNOSIS — M25611 Stiffness of right shoulder, not elsewhere classified: Secondary | ICD-10-CM

## 2017-05-30 NOTE — Therapy (Signed)
[place], [place], [address] Phone: [phone]   Fax:  [phone]  Physical Therapy Treatment  Patient Details  Name: Debra Mcintyre MRN: [id number] Date of Birth: [birth year] Referring Provider: Magrinat  Encounter Date: 05/30/2017      PT End of Session - 05/30/17 1520    Visit Number 9   Number of Visits 17   Date for PT Re-Evaluation 06/27/17   PT Start Time 2585   PT Stop Time 1517   PT Time Calculation (min) 43 min   Activity Tolerance Patient tolerated treatment well   Behavior During Therapy [facility] for tasks assessed/performed      Past Medical History:  Diagnosis Date  . Breast cancer ([place]) 09/2012   right breast/right axillary lymph node t2,pn1, stage 11b, invasive ductal carcinma, grade 3, triple negative, with an mib-1 of 15%  . Cerebral aneurysm, nonruptured   . Cerebrovascular disease, unspecified   . Colonic polyp   . Dizziness    pt. states that she experiences this occassionally 05/03/15  . Headache    occassionally  . Heart murmur   . History of chemotherapy   . Hx of radiation therapy 05/27/13-08/13/13   right chest wall/right supraclavicular/axillary region 5220 cGy 29 sessions, right mastectomy/chest wall boost cGy 5 sessions  . Hypertension    Does not see a cardiologist  . Hypothyroidism   . Neuropathy   . Pure hypercholesterolemia   . Rosacea   . Seasonal allergies   . TIA (transient ischemic attack)   . Wears glasses     Past Surgical History:  Procedure Laterality Date  . BREAST BIOPSY Right 10/23/2012  . BREAST BIOPSY Right 10/08/2012  . COLONOSCOPY    . COLONOSCOPY W/ POLYPECTOMY    . ESOPHAGOGASTRODUODENOSCOPY    . Left middle cerebral artery angioplasty  2004   by [name] had 2 follow up occurances  . MASTECTOMY W/ SENTINEL NODE BIOPSY Right 04/23/2013   Procedure: RIGHT TOTAL MASTECTOMY WITH SENTINEL LYMPH NODE BIOPSY;  Surgeon: [name], MD;  Location: [place];  Service: General;  Laterality: Right;  . PORTACATH PLACEMENT N/A 11/05/2012   Procedure: INSERTION PORT-A-CATH;  Surgeon: [name], MD;  Location: [place];  Service: General;  Laterality: N/A;  . RADIOLOGY WITH ANESTHESIA N/A 05/04/2015   Procedure: MRI BRAIN WITH AND WITHOUT;  Surgeon: Medication Radiologist, MD;  Location: [place];  Service: Radiology;  Laterality: N/A;    There were no vitals filed for this visit.      Subjective Assessment - 05/30/17 1436    Subjective I feel like I am doing well. I went back to the [facility] yesterday. I started free weights. I took my yellow band to the beach and did my exercises and walked.    Pertinent History Biopsy of the larger axillary lymph node on 10/07/2012 showed ([id number]) an invasive ductal carcinoma, triple negative, with an MIB-1 of 66%. Biopsy of the right breast the next day, [id number] [id number]) showed invasive ductal carcinoma, grade 3. Breast MRI 10/14/2012 showed a total area of irregular enhancement in the right breast measuring up to 12 cm. MRI guided biopsy of an area in the upper inner quadrant of the right breast on 10/23/2012 showed ([id number]) ductal carcinoma in situ, with foci worrisome for invasion., 04/23/2013 R total mastectomy with SLNB, completed chemo and radiation   Patient Stated Goals to decrease the pain, improve ROM   Currently in Pain? No/denies  Pain Score 0-No pain            [facility] PT Assessment - 05/30/17 0001      AROM   Right Shoulder Flexion 137 Degrees  155 after PROM   Right Shoulder ABduction 112 Degrees  140 after PROM   Right Shoulder Internal Rotation 56 Degrees                     [facility] Adult PT Treatment/Exercise - 05/30/17 0001      Manual Therapy   Manual Therapy Passive ROM;Myofascial release   Myofascial Release to right axilla in area of cording    Passive ROM to R shoulder in direction of flexion, abduction, ER, IR                         Long Term Clinic Goals - 05/30/17 1437      CC Long Term Goal  #1   Title Pt to report a 70% improvement in pain in R lateral trunk to allow improved comfort   Baseline 04/25/17- 30% improvement, 05/30/17- 100% improvement   Time 5   Period Weeks   Status Achieved     CC Long Term Goal  #2   Title Pt to demonstrate 155 degrees of right shoulder flexion to allow her to reach items in cabinets   Baseline 131, 04/25/17- 134 degrees, 05/14/17- 149 degrees, 05/30/17- 137   Time 5   Period Weeks   Status On-going     CC Long Term Goal  #3   Title Pt to demonstrate 165 degrees of right shoulder abduction to allow her to reach items out to sides   Baseline 110, 04/25/17- 114 degrees, 05/14/17- 131 degrees, 05/30/17- 112 degrees   Time 5   Period Weeks   Status On-going     CC Long Term Goal  #4   Title Pt to demonstrate 60 degrees of right shoulder IR to allow pt to return to prior level of function   Baseline 38, 04/25/17- 53 degrees with p!, 05/14/17- 56 degrees, 05/30/17- 56 degrees   Time 5   Period Weeks   Status On-going     CC Long Term Goal  #5   Title Pt to be independent in a home exercise program for continued strengthening and stretching   Time 5   Period Weeks   Status On-going            Plan - 05/30/17 1520    Clinical Impression Statement Assessed pt's progress towards goals in therapy today. She missed last weeks appointments because she was on vacation. Pt's AROM in her right shoulder has decreased since her last visit. She reports she did do her theraband exercises but was not doing the exercises with the cane and prolonged stretching. Her AROM improved today following PROM. Encouraged pt do to prolonged stretches at home to increase ROM. Pt would benefit from continued skilled PT services to increase R shoulder ROM and to progress pt towards independence with a home exercise program.    Rehab Potential Good   Clinical  Impairments Affecting Rehab Potential hx of radiation   PT Frequency 2x / week   PT Duration 4 weeks   PT Treatment/Interventions ADLs/Self Care Home Management;Patient/family education;Manual techniques;Therapeutic exercise;Manual lymph drainage;Scar mobilization;Passive range of motion;Taping   PT Next Visit Plan do gentle AA/A/PROM, pulleys,  add soft tissue work for pain managment, give wall washcloth exercises   Consulted and Agree  with Plan of Care Patient      Patient will benefit from skilled therapeutic intervention in order to improve the following deficits and impairments:  Increased edema, Decreased knowledge of precautions, Impaired UE functional use, Decreased strength, Decreased range of motion, Decreased scar mobility, Postural dysfunction, Pain, Increased fascial restricitons  Visit Diagnosis: Stiffness of right shoulder, not elsewhere classified - Plan: PT plan of care cert/re-cert  Chronic right shoulder pain - Plan: PT plan of care cert/re-cert     Problem List Patient Active Problem List   Diagnosis Date Noted  . Malignant neoplasm of upper-outer quadrant of right breast in female, estrogen receptor negative ([place]) 05/13/2013  . Anemia, unspecified 02/24/2013  . GERD (gastroesophageal reflux disease) 12/16/2012  . Asthmatic bronchitis 04/21/2011  . INTRACRANIAL ANEURYSM 08/28/2009  . Cerebrovascular disease 08/28/2009  . COLONIC POLYPS 08/26/2009  . Essential hypertension 05/12/2008  . HEMORRHOIDS 05/12/2008  . Hypothyroidism 12/02/2007  . HYPERCHOLESTEROLEMIA 12/02/2007  . Anxiety state 12/02/2007  . Transient cerebral ischemia 12/02/2007  . Osteoarthritis 12/02/2007  . FIBROMYALGIA 12/02/2007  . Osteoporosis 12/02/2007    Allyson Sabal [facility] 05/30/2017, 3:26 PM  [place], [place], [address] Phone: [phone]   Fax:  [phone]  Name: Debra Mcintyre MRN:  [id number] Date of Birth: 11-Feb-1941  Manus Gunning, PT 05/30/17 3:27 PM

## 2017-06-04 ENCOUNTER — Other Ambulatory Visit: Payer: Self-pay | Admitting: Oncology

## 2017-06-04 ENCOUNTER — Ambulatory Visit
Admission: RE | Admit: 2017-06-04 | Discharge: 2017-06-04 | Disposition: A | Discharge status: Home / Self Care | Payer: Medicare Other | Source: Ambulatory Visit | Attending: Oncology | Admitting: Oncology

## 2017-06-04 DIAGNOSIS — Z1231 Encounter for screening mammogram for malignant neoplasm of breast: Secondary | ICD-10-CM

## 2017-06-04 DIAGNOSIS — Z853 Personal history of malignant neoplasm of breast: Secondary | ICD-10-CM

## 2017-06-06 DIAGNOSIS — Z86018 Personal history of other benign neoplasm: Secondary | ICD-10-CM | POA: Diagnosis not present

## 2017-06-06 DIAGNOSIS — L578 Other skin changes due to chronic exposure to nonionizing radiation: Secondary | ICD-10-CM | POA: Diagnosis not present

## 2017-06-06 DIAGNOSIS — L821 Other seborrheic keratosis: Secondary | ICD-10-CM | POA: Diagnosis not present

## 2017-06-06 DIAGNOSIS — L720 Epidermal cyst: Secondary | ICD-10-CM | POA: Diagnosis not present

## 2017-06-07 ENCOUNTER — Ambulatory Visit: Payer: Medicare Other | Admitting: Physical Therapy

## 2017-06-13 ENCOUNTER — Encounter: Payer: Self-pay | Admitting: Physical Therapy

## 2017-06-13 ENCOUNTER — Ambulatory Visit: Payer: Medicare Other | Admitting: Physical Therapy

## 2017-06-13 DIAGNOSIS — M25611 Stiffness of right shoulder, not elsewhere classified: Secondary | ICD-10-CM | POA: Diagnosis not present

## 2017-06-13 DIAGNOSIS — R293 Abnormal posture: Secondary | ICD-10-CM

## 2017-06-13 DIAGNOSIS — G8929 Other chronic pain: Secondary | ICD-10-CM

## 2017-06-13 DIAGNOSIS — M25511 Pain in right shoulder: Secondary | ICD-10-CM

## 2017-06-13 NOTE — Therapy (Addendum)
[place], [place], [address] Phone: [phone]   Fax:  [phone]  Physical Therapy Treatment  Patient Details  Name: Debra Mcintyre MRN: [id number] Date of Birth: 09-13-1940 Referring Provider: Magrinat  Encounter Date: 06/13/2017      PT End of Session - 06/13/17 1151    Visit Number 10   Number of Visits 17   Date for PT Re-Evaluation 06/27/17   PT Start Time 1105   PT Stop Time 1145   PT Time Calculation (min) 40 min   Activity Tolerance Patient tolerated treatment well   Behavior During Therapy [facility] for tasks assessed/performed      Past Medical History:  Diagnosis Date  . Breast cancer ([place]) 09/2012   right breast/right axillary lymph node t2,pn1, stage 11b, invasive ductal carcinma, grade 3, triple negative, with an mib-1 of 15%  . Cerebral aneurysm, nonruptured   . Cerebrovascular disease, unspecified   . Colonic polyp   . Dizziness    pt. states that she experiences this occassionally 05/03/15  . Headache    occassionally  . Heart murmur   . History of chemotherapy   . Hx of radiation therapy 05/27/13-08/13/13   right chest wall/right supraclavicular/axillary region 5220 cGy 29 sessions, right mastectomy/chest wall boost cGy 5 sessions  . Hypertension    Does not see a cardiologist  . Hypothyroidism   . Neuropathy   . Pure hypercholesterolemia   . Rosacea   . Seasonal allergies   . TIA (transient ischemic attack)   . Wears glasses     Past Surgical History:  Procedure Laterality Date  . BREAST BIOPSY Right 10/23/2012  . BREAST BIOPSY Right 10/08/2012  . COLONOSCOPY    . COLONOSCOPY W/ POLYPECTOMY    . ESOPHAGOGASTRODUODENOSCOPY    . Left middle cerebral artery angioplasty  2004   by [name] had 2 follow up occurances  . MASTECTOMY Right   . MASTECTOMY W/ SENTINEL NODE BIOPSY Right 04/23/2013   Procedure: RIGHT TOTAL MASTECTOMY WITH SENTINEL LYMPH NODE BIOPSY;   Surgeon: [name], MD;  Location: [place];  Service: General;  Laterality: Right;  . PORTACATH PLACEMENT N/A 11/05/2012   Procedure: INSERTION PORT-A-CATH;  Surgeon: [name], MD;  Location: [place];  Service: General;  Laterality: N/A;  . RADIOLOGY WITH ANESTHESIA N/A 05/04/2015   Procedure: MRI BRAIN WITH AND WITHOUT;  Surgeon: Medication Radiologist, MD;  Location: [place];  Service: Radiology;  Laterality: N/A;    There were no vitals filed for this visit.      Subjective Assessment - 06/13/17 1106    Subjective My shoulder is feeling okay. [facility] because [facility] was [facility] to my [facility].    Pertinent History Biopsy of the larger axillary lymph node on 10/07/2012 showed ([id number]) an invasive ductal carcinoma, triple negative, with an MIB-1 of 66%. Biopsy of the right breast the next day, [id number] [id number]) showed invasive ductal carcinoma, grade 3. Breast MRI 10/14/2012 showed a total area of irregular enhancement in the right breast measuring up to 12 cm. MRI guided biopsy of an area in the upper inner quadrant of the right breast on 10/23/2012 showed ([id number] [id number]) ductal carcinoma in situ, with foci worrisome for invasion., 04/23/2013 R total mastectomy with SLNB, completed chemo and radiation   Patient Stated Goals to decrease the pain, improve ROM   Currently in Pain? No/denies   Pain Score 0-No pain  [facility] PT Assessment - 06/13/17 0001      AROM   Right Shoulder Flexion 140 Degrees   Right Shoulder ABduction 155 Degrees                     [facility] Adult PT Treatment/Exercise - 06/13/17 0001      Shoulder Exercises: Pulleys   Flexion 2 minutes   ABduction 2 minutes     Shoulder Exercises: Stretch   Other Shoulder Stretches Flexion on wall using washcloth x 10 reps with 3 sec holds, then 10 wall washes (abduction) bilaterally   Other Shoulder Stretches robbers stretch x 10      Manual Therapy    Manual Therapy Passive ROM;Myofascial release   Myofascial Release to right axilla in area of cording    Passive ROM to R shoulder in direction of flexion, abduction, ER, IR                        Long Term Clinic Goals - 05/30/17 1437      CC Long Term Goal  #1   Title Pt to report a 70% improvement in pain in R lateral trunk to allow improved comfort   Baseline 04/25/17- 30% improvement, 05/30/17- 100% improvement   Time 5   Period Weeks   Status Achieved     CC Long Term Goal  #2   Title Pt to demonstrate 155 degrees of right shoulder flexion to allow her to reach items in cabinets   Baseline 131, 04/25/17- 134 degrees, 05/14/17- 149 degrees, 05/30/17- 137   Time 5   Period Weeks   Status On-going     CC Long Term Goal  #3   Title Pt to demonstrate 165 degrees of right shoulder abduction to allow her to reach items out to sides   Baseline 110, 04/25/17- 114 degrees, 05/14/17- 131 degrees, 05/30/17- 112 degrees   Time 5   Period Weeks   Status On-going     CC Long Term Goal  #4   Title Pt to demonstrate 60 degrees of right shoulder IR to allow pt to return to prior level of function   Baseline 38, 04/25/17- 53 degrees with p!, 05/14/17- 56 degrees, 05/30/17- 56 degrees   Time 5   Period Weeks   Status On-going     CC Long Term Goal  #5   Title Pt to be independent in a home exercise program for continued strengthening and stretching   Time 5   Period Weeks   Status On-going            Plan - 06/13/17 1152    Clinical Impression Statement Pt missed her last appointment because she was sick. Measured her ROM today prior to any exercises or stretches. Pt has maintained PROM gains performed last session and has more AROM than she did PROM last session. She is progressing towards her goals. She still has increased tightness of right pec. Added new stretches today for bilateral pecs and continued with PROM.    Rehab Potential Good   Clinical Impairments  Affecting Rehab Potential hx of radiation   PT Frequency 2x / week   PT Duration 4 weeks   PT Treatment/Interventions ADLs/Self Care Home Management;Patient/family education;Manual techniques;Therapeutic exercise;Manual lymph drainage;Scar mobilization;Passive range of motion;Taping   PT Next Visit Plan Give RX for sleeve, do gentle AA/A/PROM, pulleys,  add soft tissue work for pain managment, give wall washcloth exercises   Consulted and  Agree with Plan of Care Patient      Patient will benefit from skilled therapeutic intervention in order to improve the following deficits and impairments:  Increased edema, Decreased knowledge of precautions, Impaired UE functional use, Decreased strength, Decreased range of motion, Decreased scar mobility, Postural dysfunction, Pain, Increased fascial restricitons  Visit Diagnosis: Stiffness of right shoulder, not elsewhere classified  Chronic right shoulder pain  Abnormal posture   G Code: Quick Dash Carrying, moving and handling objects Current Status: CJ Goal: CI   Problem List Patient Active Problem List   Diagnosis Date Noted  . Malignant neoplasm of upper-outer quadrant of right breast in female, estrogen receptor negative ([place]) 05/13/2013  . Anemia, unspecified 02/24/2013  . GERD (gastroesophageal reflux disease) 12/16/2012  . Asthmatic bronchitis 04/21/2011  . INTRACRANIAL ANEURYSM 08/28/2009  . Cerebrovascular disease 08/28/2009  . COLONIC POLYPS 08/26/2009  . Essential hypertension 05/12/2008  . HEMORRHOIDS 05/12/2008  . Hypothyroidism 12/02/2007  . HYPERCHOLESTEROLEMIA 12/02/2007  . Anxiety state 12/02/2007  . Transient cerebral ischemia 12/02/2007  . Osteoarthritis 12/02/2007  . FIBROMYALGIA 12/02/2007  . Osteoporosis 12/02/2007    Allyson Sabal [facility] 06/13/2017, 11:54 AM  [place], [place], [address] Phone: [phone]   Fax:   [phone]  Name: NIKEA SETTLE MRN: [id number] Date of Birth: [birth year]  Manus Gunning, PT 06/13/17 11:54 AM

## 2017-06-15 ENCOUNTER — Encounter: Payer: Self-pay | Admitting: Physical Therapy

## 2017-06-15 ENCOUNTER — Ambulatory Visit: Payer: Medicare Other | Attending: Oncology | Admitting: Physical Therapy

## 2017-06-15 DIAGNOSIS — M6281 Muscle weakness (generalized): Secondary | ICD-10-CM | POA: Insufficient documentation

## 2017-06-15 DIAGNOSIS — R293 Abnormal posture: Secondary | ICD-10-CM | POA: Diagnosis not present

## 2017-06-15 DIAGNOSIS — M25611 Stiffness of right shoulder, not elsewhere classified: Secondary | ICD-10-CM

## 2017-06-15 DIAGNOSIS — M25511 Pain in right shoulder: Secondary | ICD-10-CM | POA: Diagnosis not present

## 2017-06-15 DIAGNOSIS — G8929 Other chronic pain: Secondary | ICD-10-CM | POA: Diagnosis not present

## 2017-06-15 NOTE — Therapy (Signed)
[place], [place], [address] Phone: [phone]   Fax:  [phone]  Physical Therapy Treatment  Patient Details  Name: Debra Mcintyre MRN: [id number] Date of Birth: 04/26/1941 Referring Provider: Magrinat  Encounter Date: 06/15/2017      PT End of Session - 06/15/17 1103    Visit Number 11   Number of Visits 17   Date for PT Re-Evaluation 06/27/17   PT Start Time 1018   PT Stop Time 1100   PT Time Calculation (min) 42 min   Activity Tolerance Patient tolerated treatment well   Behavior During Therapy [facility] for tasks assessed/performed      Past Medical History:  Diagnosis Date  . 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 Cerebral aneurysm, nonruptured   . Cerebrovascular disease, unspecified   . Colonic polyp   . Dizziness    pt. states that she experiences this occassionally 05/03/15  . Headache    occassionally  . Heart murmur   . History of chemotherapy   . Hx of radiation therapy 05/27/13-08/13/13   right chest wall/right supraclavicular/axillary region 5220 cGy 29 sessions, right mastectomy/chest wall boost cGy 5 sessions  . Hypertension    Does not see a cardiologist  . Hypothyroidism   . Neuropathy   . Pure hypercholesterolemia   . Rosacea   . Seasonal allergies   . TIA (transient ischemic attack)   . Wears glasses     Past Surgical History:  Procedure Laterality Date  . BREAST BIOPSY Right 10/23/2012  . BREAST BIOPSY Right 10/08/2012  . COLONOSCOPY    . COLONOSCOPY W/ POLYPECTOMY    . ESOPHAGOGASTRODUODENOSCOPY    . Left middle cerebral artery angioplasty  2004   by [name] had 2 follow up occurances  . MASTECTOMY Right   . MASTECTOMY W/ SENTINEL NODE BIOPSY Right 04/23/2013   Procedure: RIGHT TOTAL MASTECTOMY WITH SENTINEL LYMPH NODE BIOPSY;   Surgeon: [name], MD;  Location: [place];  Service: General;  Laterality: Right;  . PORTACATH PLACEMENT N/A 11/05/2012   Procedure: INSERTION PORT-A-CATH;  Surgeon: [name], MD;  Location: [place];  Service: General;  Laterality: N/A;  . RADIOLOGY WITH ANESTHESIA N/A 05/04/2015   Procedure: MRI BRAIN WITH AND WITHOUT;  Surgeon: Medication Radiologist, MD;  Location: [place];  Service: Radiology;  Laterality: N/A;    There were no vitals filed for this visit.      Subjective Assessment - 06/15/17 1019    Subjective My shoulder is okay. I started water yoga.    Pertinent History Biopsy of the larger axillary lymph node on 10/07/2012 showed ([id number]) an invasive ductal carcinoma, triple negative, with an MIB-1 of 66%. Biopsy of the right breast the next day, [id number] [id number]) showed invasive ductal carcinoma, grade 3. Breast MRI 10/14/2012 showed a total area of irregular enhancement in the right breast measuring up to 12 cm. MRI guided biopsy of an area in the upper inner quadrant of the right breast on 10/23/2012 showed ([id number]) ductal carcinoma in situ, with foci worrisome for invasion., 04/23/2013 R total mastectomy with SLNB, completed chemo and radiation   Patient Stated Goals to decrease the pain, improve ROM   Currently in Pain? No/denies   Pain Score 0-No pain  Katina Dung - 06/15/17 0001    Open a tight or new jar Moderate difficulty   Do heavy household chores (wash walls, wash floors) Moderate difficulty   Carry a shopping bag or briefcase No difficulty   Wash your back No difficulty   Use a knife to cut food No difficulty   Recreational activities in which you take some force or impact through your arm, shoulder, or hand (golf, hammering, tennis) Moderate difficulty   During the past week, to what extent has your arm, shoulder or hand problem interfered with your normal social activities with family, friends, neighbors,  or groups? Not at all   During the past week, to what extent has your arm, shoulder or hand problem limited your work or other regular daily activities Slightly   Arm, shoulder, or hand pain. Mild   Tingling (pins and needles) in your arm, shoulder, or hand Mild   Difficulty Sleeping Mild difficulty   DASH Score 22.73 %               [facility] Adult PT Treatment/Exercise - 06/15/17 0001      Shoulder Exercises: Pulleys   Flexion 2 minutes   ABduction 2 minutes     Manual Therapy   Manual Therapy Passive ROM;Myofascial release   Myofascial Release to right pec   Passive ROM to R shoulder in direction of flexion, abduction, ER, IR                        Long Term Clinic Goals - 06/15/17 1028      CC Long Term Goal  #1   Title Pt to report a 70% improvement in pain in R lateral trunk to allow improved comfort   Baseline 04/25/17- 30% improvement, 05/30/17- 100% improvement   Time 5   Period Weeks   Status Achieved     CC Long Term Goal  #2   Title Pt to demonstrate 155 degrees of right shoulder flexion to allow her to reach items in cabinets   Baseline 131, 04/25/17- 134 degrees, 05/14/17- 149 degrees, 05/30/17- 137, 06/15/17- 158   Time 5   Period Weeks   Status Achieved     CC Long Term Goal  #3   Title Pt to demonstrate 165 degrees of right shoulder abduction to allow her to reach items out to sides   Baseline 110, 04/25/17- 114 degrees, 05/14/17- 131 degrees, 05/30/17- 112 degrees, 06/15/17- 140 degrees   Time 5   Period Weeks   Status On-going     CC Long Term Goal  #4   Title Pt to demonstrate 60 degrees of right shoulder IR to allow pt to return to prior level of function   Baseline 38, 04/25/17- 53 degrees with p!, 05/14/17- 56 degrees, 05/30/17- 56 degrees, 06/15/17- 76 degrees   Time 5   Period Weeks   Status Achieved     CC Long Term Goal  #5   Title Pt to be independent in a home exercise program for continued strengthening and stretching   Time  5   Period Weeks   Status On-going            Plan - 06/15/17 1103    Clinical Impression Statement Assessed pt's progress towards goals in therapy today. She has made marked progress over the last week. She has now met her flexion ROM goal and is approaching meeting her abduction ROM goal. Her score on the Quick Dash was  50% improved. Continued with stretching with focus on abduction today. WIll begin incorporating more strengthening exercises since pt has made such great improvements with her ROM.    Rehab Potential Good   Clinical Impairments Affecting Rehab Potential hx of radiation   PT Frequency 2x / week   PT Duration 4 weeks   PT Treatment/Interventions ADLs/Self Care Home Management;Patient/family education;Manual techniques;Therapeutic exercise;Manual lymph drainage;Scar mobilization;Passive range of motion;Taping   PT Next Visit Plan Give RX for sleeve, do gentle AA/A/PROM, pulleys,  add soft tissue work for pain managment, give wall washcloth exercises   Consulted and Agree with Plan of Care Patient      Patient will benefit from skilled therapeutic intervention in order to improve the following deficits and impairments:  Increased edema, Decreased knowledge of precautions, Impaired UE functional use, Decreased strength, Decreased range of motion, Decreased scar mobility, Postural dysfunction, Pain, Increased fascial restricitons  Visit Diagnosis: Stiffness of right shoulder, not elsewhere classified  Chronic right shoulder pain  Abnormal posture     Problem List Patient Active Problem List   Diagnosis Date Noted  . Malignant neoplasm of upper-outer quadrant of right breast in female, estrogen receptor negative ([place]) 05/13/2013  . Anemia, unspecified 02/24/2013  . GERD (gastroesophageal reflux disease) 12/16/2012  . Asthmatic bronchitis 04/21/2011  . INTRACRANIAL ANEURYSM 08/28/2009  . Cerebrovascular disease 08/28/2009  . COLONIC POLYPS 08/26/2009  . Essential  hypertension 05/12/2008  . HEMORRHOIDS 05/12/2008  . Hypothyroidism 12/02/2007  . HYPERCHOLESTEROLEMIA 12/02/2007  . Anxiety state 12/02/2007  . Transient cerebral ischemia 12/02/2007  . Osteoarthritis 12/02/2007  . FIBROMYALGIA 12/02/2007  . Osteoporosis 12/02/2007    Allyson Sabal [facility] 06/15/2017, 11:05 AM  [place], [place], [address] Phone: [phone]   Fax:  [phone]  Name: LAVILLA DELAMORA MRN: [id number] Date of Birth: [birth year]  Manus Gunning, PT 06/15/17 11:05 AM

## 2017-06-18 ENCOUNTER — Encounter: Payer: Self-pay | Admitting: Pulmonary Disease

## 2017-06-18 ENCOUNTER — Encounter: Payer: Self-pay | Admitting: Physical Therapy

## 2017-06-18 ENCOUNTER — Ambulatory Visit: Payer: Medicare Other | Admitting: Physical Therapy

## 2017-06-18 ENCOUNTER — Ambulatory Visit (INDEPENDENT_AMBULATORY_CARE_PROVIDER_SITE_OTHER): Payer: Medicare Other | Admitting: Pulmonary Disease

## 2017-06-18 VITALS — BP 136/62 | HR 80 | Temp 98.1°F | Ht 68.0 in | Wt 162.0 lb

## 2017-06-18 DIAGNOSIS — R293 Abnormal posture: Secondary | ICD-10-CM | POA: Diagnosis not present

## 2017-06-18 DIAGNOSIS — E039 Hypothyroidism, unspecified: Secondary | ICD-10-CM | POA: Diagnosis not present

## 2017-06-18 DIAGNOSIS — Z23 Encounter for immunization: Secondary | ICD-10-CM | POA: Diagnosis not present

## 2017-06-18 DIAGNOSIS — C50411 Malignant neoplasm of upper-outer quadrant of right female breast: Secondary | ICD-10-CM

## 2017-06-18 DIAGNOSIS — K219 Gastro-esophageal reflux disease without esophagitis: Secondary | ICD-10-CM | POA: Diagnosis not present

## 2017-06-18 DIAGNOSIS — M150 Primary generalized (osteo)arthritis: Secondary | ICD-10-CM

## 2017-06-18 DIAGNOSIS — M818 Other osteoporosis without current pathological fracture: Secondary | ICD-10-CM

## 2017-06-18 DIAGNOSIS — D126 Benign neoplasm of colon, unspecified: Secondary | ICD-10-CM | POA: Diagnosis not present

## 2017-06-18 DIAGNOSIS — E559 Vitamin D deficiency, unspecified: Secondary | ICD-10-CM

## 2017-06-18 DIAGNOSIS — M6281 Muscle weakness (generalized): Secondary | ICD-10-CM | POA: Diagnosis not present

## 2017-06-18 DIAGNOSIS — E7800 Pure hypercholesterolemia, unspecified: Secondary | ICD-10-CM | POA: Diagnosis not present

## 2017-06-18 DIAGNOSIS — G8929 Other chronic pain: Secondary | ICD-10-CM | POA: Diagnosis not present

## 2017-06-18 DIAGNOSIS — M25511 Pain in right shoulder: Secondary | ICD-10-CM

## 2017-06-18 DIAGNOSIS — F411 Generalized anxiety disorder: Secondary | ICD-10-CM | POA: Diagnosis not present

## 2017-06-18 DIAGNOSIS — I679 Cerebrovascular disease, unspecified: Secondary | ICD-10-CM

## 2017-06-18 DIAGNOSIS — J4520 Mild intermittent asthma, uncomplicated: Secondary | ICD-10-CM

## 2017-06-18 DIAGNOSIS — M159 Polyosteoarthritis, unspecified: Secondary | ICD-10-CM

## 2017-06-18 DIAGNOSIS — Z171 Estrogen receptor negative status [ER-]: Secondary | ICD-10-CM

## 2017-06-18 DIAGNOSIS — M25611 Stiffness of right shoulder, not elsewhere classified: Secondary | ICD-10-CM | POA: Diagnosis not present

## 2017-06-18 DIAGNOSIS — I10 Essential (primary) hypertension: Secondary | ICD-10-CM | POA: Diagnosis not present

## 2017-06-18 NOTE — Patient Instructions (Signed)
Today we updated your med list in our EPIC system...    Continue your current medications the same...  Keep up the great work at the [employer].Marland KitchenMarland Kitchen  Consider adding-in some mental exercises (try LUMINOSITY on your computer)...  Today we gave you the 2018 Flu vaccine...  Please return to our lab one morning this week for your follow up FASTING lipid panel, thyroid test & vitD level...    We will contact you w/ the results when available...   Call for any questions...  Let's plan a follow up visit in 6+months, sooner if needed for problems.Marland KitchenMarland Kitchen

## 2017-06-18 NOTE — Progress Notes (Signed)
Subjective:    Patient ID: Debra Mcintyre, female    DOB: 08/15/1940, 76 y.o.   MRN: [id number]  HPI 76 y/o WF here for a follow up visit... she has multiple medical problems as noted below...  ~  SEE PREV EPIC NOTES FOR OLDER DATA>>   ~  March 2. 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 She developed back & hip pain while getting her XRT- eval by DrCaffrey w/ ?HNP, no sign of mets, given epid steroid shot which really helped...  She has some paresthesias & tried Neurontin, stopped this on her own...     HBP> on MetopER100, Lisin20; BP= 112/64 & she denies CP, palpit, SOB, edema...    Cerebrovasc Dis> on [id number] & Plavix75; s/p aneurysm & TIA; stable w/o cerebral ischemic symptoms- she has not had f/u DrDeveshwar-IR- she notes she was told no need for f/u angiography (we might consider MRI later)...    Chol> on Cres20; FLP showed TChol 164, TG 116, HDL 49, LDL 92; rec to continue same med & diet efforts...    Hypothyroid> on Synthroid 138mcg/d; TSH= 0.35 & she is clinically euthyroid, continue same...    Colon polyps> last colon was 11/10 w/ one adenomatous polyp removed, f/u planned 46yrs.    Breast cancer> followed by DrMagrinat, DrWakefield, DrMurray w/ prev surg, chemoRx and XRT- notes reviewed...    DJD, FM, Osteop> on Calcium, MVI, OTC analgesics; BMD followed by her GYN...    Anxiety> on EffexorXR75 and she reports stable on this med...  We reviewed prob list, meds, xrays and labs> see below for updates >>   CXR 3/15 showed borderline heart size, left sided port, new RUL airsp dis (could be fibrosis from XRT), scoliosis & lower Tspine compression...  CT Chest 3/15 showed subpleural airspace  consolidation and ground-glass in the anterior aspect of the right upper and right middle lobes indicative of radiation therapy; post op changes in right breast & axila; incidental 66mm nodule in RMLunchanged from 2013, no adenopathy, atherosclerotic calcif noted...   LABS 3/15:  FLP- at goals on Cres20;  Chems- wnl;  CBC- wnl w/ Hg=12.6;  TSH=0.35 on Synthroid125...   ~  April 16, 2014:  75mo ROV & Shahd reports a good interval- she informs me that she has decided against reconstruction & is very comfortable w/ her decision;  She notes some fall allergies & is using Zyrtek prn;  She gets exercise by walking the dog & will join a Y program soon;  She has had f/u visits w/ DrSanger, DrWakefield, & DrMagrinat in the interval- stable & doing satis, f/u left mammogram 6/15 (she is s/p right mastectomy) and it was neg... We reviewed the following medical problems during today's office visit >>     AbnCXR> CXR 3/15 showed new RUL airspace dis & CTChest 3/15 revealed GG opacities prob related to radiation therapy; incidental 75mm nodule in RML w/o ch from 2013, atherosclerotic calcif, no adenopathy; due for f/u film today=> improved...    HBP> on MetopER100, Lisin20; BP= 122/68 & she denies CP, palpit, SOB, edema; she wants to decr the MeptopER to 50mg /d- ok & watch BP at home...    Cerebrovasc Dis> on ASA325 & Plavix75;  s/p aneurysm & TIA; stable w/o cerebral ischemic symptoms- she has not had f/u DrDeveshwar,IR- she notes she was told no need for f/u angiography (we might consider MRI later)...    Chol> on Cres20; FLP 3/15 showed TChol 164, TG 116, HDL 49, LDL 92; rec to continue same med & diet efforts...    Hypothyroid> on Synthroid 155mcg/d; labs 3/15 showed TSH= 0.35 & she is clinically euthyroid, continue same...    Colon polyps> last colon was 11/10 w/ one adenomatous polyp removed, f/u planned 48yrs.    Breast cancer> followed by DrMagrinat, DrWakefield, DrMurray w/ prev surg, chemoRx and XRT- notes  reviewed; she has decided against reconstruction...    DJD, FM, Osteop> on Calcium, MVI, OTC analgesics; BMD followed by her GYN...    Anxiety> on EffexorXR75 and she reports stable on this med...  We reviewed prob list, meds, xrays and labs> see below for updates >>   CXR 9/15 showed no infiltrate, opacity, etc; scoliosis & stable compression deformity; port has been removed...  ~  October 13, 2014:  62mo ROV & Debra Mcintyre reports feeling well, no new commplaints or concerns; she notes that her chest is ok- no CP, palpit, SOB, and she denies cough/ sput/ hemoptysis; her weight is 156# w/ BMI=24... We reviewed the following medical problems during today's office visit >>     AbnCXR> CXR 3/15 w/ new RUL airspace dis & CTChest 3/15 revealed GG opacities prob related to radiation therapy; incidental 87mm nodule in RML w/o ch from 2013, atherosclerotic calcif, no adenopathy; f/u film 9/15=> improved.    HBP> on MetopER100, Lisin20; BP= 112/64 & she denies CP, palpit, SOB, edema...    Cerebrovasc Dis> on CZY606 & Plavix75; s/p aneurysm & TIA; stable w/o cerebral ischemic symptoms- she has not had f/u DrDeveshwar,IR- she notes she was told no need for f/u angiography (we might consider MRI later)...    Chol> on Cres20; FLP 3/16 showed TChol 174, TG 200, HDL 45, LDL 89; rec to continue same med & diet efforts...    Hypothyroid> on Synthroid 126mcg/d; labs 3/16 showed TSH= 0.11 & she is clinically euthyroid, rec to step down her Syntroid dose to 116mcg/d...    Colon polyps> last colon was 11/10 w/ one adenomatous polyp removed, f/u planned by DrDBrodie 3/16- pending...    Breast cancer> followed by DrMagrinat Q50mo, DrWakefield, DrMurray w/ prev surg, chemoRx (triple neg tumor) and XRT- notes reviewed; she has decided against reconstruction...    DJD, FM, Osteop> on Calcium, MVI, OTC analgesics; BMD followed by her GYN...    Anxiety> on EffexorXR75 and she reports stable on this med...  We reviewed prob list, meds,  xrays and labs> see below for updates >>   LABS 3/16:  FLP- ok on Cres20 x TG=200;  Chems- wnl;  CBC- wnl;  TSH=0.11 on Synthroid125... PLAN>> Lainie is stable & in good spirits; she is asked to follow a low chol/ low fat diet & work on wt reduction; her TSH is oversuppressed on Synthroid125 & we will step her down to 147mcg/d...   ~  April 20, 2015:  44mo ROV & Adreanne reports doing well, feeling well, no new complaints or concerns;  She continues to f/u w/ DrMagrinat & DrWakefield yearly (alternating Q70mo)- she saw DrMagrinat 8/16 & stable, no evid dis recurrence... We reviewed the following medical problems during today's office visit >>     AbnCXR> CXR 3/15 w/ new RUL airspace dis & CTChest 3/15 revealed GG opacities prob related  to radiation therapy; incidental 67mm nodule in RML w/o ch from 2013, atherosclerotic calcif, no adenopathy; f/u film 9/15=> improved, and serial f/u film 9/16=> clear, wnl...    HBP> on MetopER100, Lisin20; BP= 124/76 & she denies CP, palpit, SOB, edema; we discussed increasing her physical activity...    Cerebrovasc Dis> on ASA325 & Plavix75; Hx cerebrovasc dis, tiny aneurysm & TIA; stable w/o cerebral ischemic symptoms- 9/16 she mentioned that she'd like to have f/u MRI/MRA to recheck vasc status and the sm MCA aneurysm...    Chol> on Cres20; FLP 3/16 showed TChol 174, TG 200, HDL 45, LDL 89; rec to continue same med & diet efforts...    Hypothyroid> on Synthroid 15mcg/d; labs 3/16 showed TSH= 0.11 & she is clinically euthyroid, rec to step down her Syntroid dose to 114mcg/d...    Colon polyps> she had f/u colonoscopy 6/16 by DrDBrodie- 34mm sessile polyp removed from the desc colon=> tubular adenoma & they rec f/u colon in 5 yrs...    Breast cancer> Hx right breast cancer Dx 2/14 w/ ChemoRx, Surg, XRT; she continues f/u w/ DrMagrinat & DrWakefield Q14mo; last seen 8/16 & stable- no known recurrence, f/u mammogram left breast 8/16 remains neg...     DJD, FM, Osteop> on  Calcium, MVI, OTC analgesics; BMD done 3/16 [facility] w/ lowest Tscore-1.5 in distal left radius; encouraged to continue supplements and wt bearing exercise...    Anxiety> on EffexorXR75 and she reports stable on this med...  EXAM showed Afeb, VSS, O2sat=96%;  HEENT- neg, mallampati2;  Chest- clear w/o w/r/r;  Heart- RR w/o m/r/g;  Abd- soft, neg;  Ext- w/o c/c/e;  Neuro- intact w/o focal neuro deficits...   CXR 9/16 showed norm heart size, clear lungs, +scoliosis, lower thoracic compression deformity is stable, NAD.Marland KitchenMarland Kitchen  EKG 04/2015 showed NSR, rate65, IRBBB, otherw norm EKG, NAD...  LABS 04/2015:  Chems- wnl;  CBC- wnl  MRI / MRA Brain => pending IMP/PLAN>>  Kierah continues to do well- no new complaints or concerns, but she is worried about the fact that she hasn't has a f/u MRI/MRA in 29yrs; she continues stable w/o cerebral ischemic symptoms on ASA /Plavix; we will order the scan for follow up;  She needs to increase her exercise program & will consider [employer]ne[employer] at the [employer] at the cancer center classes...  ADDENDUM>>  MRI done 05/04/15- for some reason MRA was not done;  No infarct or hemorrhage, no intracranial mass or abn enhancement; mod small vessel dis & mild global atrophy; she needs MRA to assess the 1.5-34mm right MCA bifuraction aneurysm-- I discussed w/ pt & she decided to forego the MRA at this time since she remains asymptomatic...  ~  October 18, 2015:  76mo ROV & Navayah reports that she is stable, good 58mo interval, no new complaints or concerns;  She had left cat surg 2 wks ago by DrShapiro & planning right cat surg soon;  She notes that her neuropathy symptoms in feet>hands and she upped her Gabapentin to 300mg  Bid (helping);  Epic reveals that she had a scare 06/2015 w/ lump felt in right mastectomy scar & 15mm hypoechoic nodule on ultrasound; DrWakefield rec Bx & this was done 07/12/15 by radiology=> Path neg inflammed fibroadipose tissue c/w scar, no malignancy... We reviewed the  following medical problems during today's office visit >>     S/p cat surg> as noted, DrShapiro    AbnCXR> CXR 3/15 w/ new RUL airspace dis & CTChest 3/15  revealed GG opacities prob related to radiation therapy; incidental 26mm nodule in RML w/o ch from 2013, atherosclerotic calcif, no adenopathy; f/u film 9/15=> improved, and serial f/u film 9/16=> clear, wnl...    HBP> on MetopER100, Lisin20; BP= 124/70 & she denies CP, palpit, SOB, edema; we discussed increasing her physical activity...    Cerebrovasc Dis> on ASA325 & Plavix75; Hx cerebrovasc dis, tiny aneurysm & TIA; stable w/o cerebral ischemic symptoms- 9/16 she mentioned that she'd like to have f/u MRI/MRA to recheck vasc status and the sm MCA aneurysm=> mod small vessel dis, no evid intracranial mets, right MCA bifurcation aneurysm ~50mm size needs MRA or CTA to properly assess but she decided to hold off on additional scans for now...    Chol> on Cres20; FLP 3/17 showed TChol 195, TG 184, HDL 49, LDL 110; rec to continue same med & diet efforts...    Hypothyroid> on Synthroid 159mcg/d; labs 3/17 showed TSH= 2.23 & she is clinically euthyroid- continue same    Colon polyps> she had f/u colonoscopy 6/16 by DrDBrodie- 67mm sessile polyp removed from the desc colon=> tubular adenoma & they rec f/u colon in 5 yrs...    Breast cancer> Hx right breast cancer Dx 2/14 w/ ChemoRx, Surg, XRT; she continues f/u w/ DrMagrinat & DrWakefield Q22mo; last seen 8/16 & stable- no known recurrence, she had ultrasound bx 11/16 of right chest wall- NEG    DJD, FM, Osteop> on Calcium, MVI, OTC analgesics; BMD done 3/16 at [facility] w/ lowest Tscore-1.5 in distal left radius; encouraged to continue supplements and wt bearing exercise...    Neuro w/ mod small vessel dis & global atrophy on MRI + Neuropathy on Gabapentin 300mg  Bid    Anxiety> on EffexorXR75 and she reports stable on this med...  EXAM showed Afeb, VSS, O2sat=96%;  HEENT- neg, mallampati2;  Chest- clear w/o w/r/r;   Heart- RR w/o m/r/g;  Abd- soft, neg;  Ext- w/o c/c/e;  Neuro- intact w/o focal neuro deficits...   LABS 10/22/15>  FLP- ok on Cres20 w/ LDL=110;  Chems- wnl x BS=106;  CBC- wnl;  TSH=2.23;  VitD=47... IMP/PLAN>>  Mariza has been stable & the right breast scare from 11/16 turned out to be inflammed adipose tissue/ scar;  Labs are OK & she continues to work on diet/ exercise, continue same meds; we discussed ROV recheck in 35mo...   ~  September 20, 2016:  37mo ROV & add-on appt requested due to cough, yellow sput, low grade fever along w/ fatigue & body aches;  URI=> ILI w/ assoc nausea, no vomiting, some abd discomfort & sl loose stool;  We discussed Rx w/ Depo80, Pred taper, Levaquin, Mucinex, & Hycodan, plus align, fluids, etc...     HxAbnCXR> CXR 3/15 w/ new RUL airspace dis & CTChest 3/15 revealed GG opacities prob related to radiation therapy; incidental 84mm nodule in RML w/o ch from 2013, atherosclerotic calcif, no adenopathy; f/u film 9/15=> improved, and serial f/u film 9/16=> clear, wnl...    EXAM showed Afeb, VSS, O2sat=97% on RA;  HEENT- neg, mallampati2;  Chest- clear x few scat rhonchi w/o consolid/ wheezing;  Heart- RR w/o m/r/g;  Abd- soft, neg;  Ext- w/o c/c/e;  Neuro- intact w/o focal neuro deficits...   CXR 09/20/16 (independently reviewed by me in the PACS system) showed norm heart size, Ao atherosclerosis, clear lungs w/ mild left base atx, scoliosis of spine & osteopenia; Note- mild chr compression of lower Tspine vertebra... IMP/PLAN>>  Elizah has an upper  resp infection/ ILI/ AB exac=> discussed treatment w/ Depo80, Pred taper (see AVS), Levaquin500/d, Mucinex600Qid w/ fluids, & Hycodan prn cough...   ~  June 18, 2017:  66mo ROV & add-on appt requested for 3d hx dyspnea>  She was referred by DrMagrinat for phys therapy (it's helping her right arm) & is participating in the [place] program at the cancer center, going to the gym 2d/wk etc and she plans to participate in a silver  sneakers program going forward;  She notes that her breathing is doing well now- no cough, phlegm, hemoptysis; no CP, palpit, dizzy, edema; she denies abd pain, n/v, c/d, blood seen, etc... We gave her the 2018 FLU vaccine today...    She saw DrBowers and had lower lid blepharoplasty;  Her Ophthalmologist is Dr.Shapiro & he's done bilat cat surg in past...    She saw DrMagrinat- Oncology 03/28/17> hx right breast cancer & pos right axillary node 2014 treated w/ chemoRx, Surg, XRT; no evid of disease recurrence, she was c/o some arm discomfort & referred for phys therapy. We reviewed the following medical problems during today's office visit >>     AbnCXR> CXR 3/15 w/ new RUL airspace dis & CTChest 3/15 revealed GG opacities prob related to radiation therapy; incidental 42mm nodule in RML w/o ch from 2013, atherosclerotic calcif, no adenopathy; f/u film 9/15=> improved, and serial f/u film thru 2/18=> clear, wnl...    HBP> on MetopER100, Lisin20; BP= 136/62 & she denies CP, palpit, SOB, edema; we discussed increasing her physical activity...    Cerebrovasc Dis> on ASA325 & Plavix75; Hx cerebrovasc dis, tiny aneurysm & TIA; stable w/o cerebral ischemic symptoms- 9/16 she mentioned that she'd like to have f/u MRI/MRA to recheck vasc status and the sm MCA aneurysm=> mod small vessel dis, no evid intracranial mets, right MCA bifurcation aneurysm ~110mm size needs MRA or CTA to properly assess but she decided to hold off on additional scans for now...    Chol> on Cres20; FLP 11/18 shows TChol 159, TG 176, HDL 44, LDL 81; rec to continue same med & diet efforts...    Hypothyroid> on Synthroid 156mcg/d; labs 3/17 showed TSH= 2.23 & she is clinically euthyroid; f/u Labs 11/18 showed TSH=0.39...    Colon polyps> she had f/u colonoscopy 6/16 by DrDBrodie- 52mm sessile polyp removed from the desc colon=> tubular adenoma & they rec f/u colon in 5 yrs...    Breast cancer> Hx right breast cancer & pos axillary nodes Dx 2/14  w/ ChemoRx, Surg, XRT; she continues f/u w/ DrMagrinat & DrWakefield; ultrasound bx 11/16 of right chest wall- NEG; last seen 8/18 as above & no known recur.    DJD, FM, Osteop> on Calcium, MVI, OTC analgesics; BMD done 3/16 at [facility] w/ lowest Tscore-1.5 in distal left radius; encouraged to continue supplements and wt bearing exercise; f/u BMD 9/18 w/ lowest Tscore -0.4 in wrist...    Neuro w/ mod small vessel dis & global atrophy on MRI + Neuropathy on ASA, Plavix, Gabapentin 300mg  Bid>  We discussed the need for physical activity & mental activity like Lumosity...    [name] has noted some memory issues- she did not bring her med bottles w/ her today- we checked w/ [facility] on [address] & she last filled Zoloft100 in Jan2018 & last filled EffexorXR75 in Nov2017...    Anxiety> she did not bring med bottles to the [facility] indicates that she has not been filling the Zoloft100 or the EffexorXR75... EXAM showed Afeb,  VSS, O2sat=96% on RA;  HEENT- neg, mallampati2;  Chest- clear x few scat rhonchi w/o consolid/ wheezing;  Heart- RR w/o m/r/g;  Abd- soft, neg;  Ext- w/o c/c/e;  Neuro- intact w/o focal neuro deficits...   BMD 05/01/17>  Lowest Tscore -0.4 in forearm radius which is wnl-- rec to continue Calcium, MVI, & ~1000u VitD daily supplements...  LABS 06/18/17>  FLP- Chol parameters are wnl on Cres20 but TG=176 & rec better low fat diet/ get wt down;  TSH=0.39 on Synthroid112;  VitD=41 & rec to take OTC VitD supplement ~1000u daily... IMP/PLAN>>  Dorothee will continue w/ the [employer] exercise program & consider silver sneakers etc;  We discucced mental activity as well- books, puzzles, consider Lumosity on the computer;  Give 2018 FLU vaccine...           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 Marland Kitchen.. ~  Followed by DrMagrinat, DrWakefield, DrMurray w/ prev surg, chemoRx and XRT => SUMMARY:    1) status post right breast and right axillary lymph node biopsies February 2014 of a clinical T2, pN1, stage IIB invasive ductal carcinoma, grade 3, triple negative, with an MIB-1 of 15%.    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    4)status post right mastectomy and sentinel lymph node sampling 04/23/2013 for a residual pT1b pN1 invasive ductal carcinoma, grade 3, again triple negative [2/10 sample lymph nodes were involved by tumor, both with micrometastatic deposits -- NOTE: she opted against reconstruction.    5) adjuvant radiation completed 08/13/2013    6) the patient underwent genetics counseling but declined testing. ~  2015: She reports that 21 y/o sister was Dx w/ breast cancer this yr...  ABNORMAL CXR >>  ~  3/15:  CXR showed new RUL airspace dis & CTChest 3/15 revealed GG opacities prob related to radiation therapy; incidental 1mm nodule in RML w/o ch from 2013, atherosclerotic calcif, no adenopathy... ~  9/15:  CXR 9/15 showed no infiltrate, opacity, etc; scoliosis & stable compression deformity; port has been removed... ~  9/16:  CXR 9/16 showed norm heart size, clear lungs, +scoliosis, lower thoracic compression deformity is stable, NAD...  HYPERTENSION (ICD-401.9) > ~  on TOPROL XL 100mg /d,  LISINOPRIL/ Hct 20/12.5 daily; BP well controlled on these meds; denies HA, fatigue, visual changes, CP, palipit, dizziness, syncope, dyspnea, edema, etc; she does water aerobics & walks for exercise...  ~  CXR 8/12 showed normal heart size, clear lungs,  scoliosis, NAD... ~  10/12:  Presents w/ chest discomfort> EKG showed NSR, rate 69, rsr' in V1-2, otherw wnl; referred to Cards=> she requests DrKelly. ~  11/13:  BP= 128/82 & she remains asymptomatic... ~  7/14: on MetopER100 & LisinHCT20-12.5; BP= 128/72 but has been volatile at home & in Oncology office prompting DrMagrinat to change to Lisin10 & stop the Hct in light of her 103 wt loss...  ~  3/15: on MetopER100, Lisin20; BP= 112/64 & she denies CP, palpit, SOB, edema ~  9/15: on MetopER100, Lisin20; BP= 122/68 & she denies CP, palpit, SOB, edema; she wants to decr the MeptopER to 50mg /d => but she never did... ~  3/16: on MetopER100, Lisin20; BP= 112/64 & she denies CP, palpit, SOB, edema ~  9/16: on MetopER100, Lisin20; BP= 124/76 & she remains asymptomatic; we discussed increasing her physical activity...  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 she's been stable since that time w/ out pt f/u by IR, [name]> she indicates he said no further studies needed. ~  CDopplers 5/04 showed mild plaque, no signif ICA stenoses... ~  MR studies 10/09 showed sm vessel dis; poss restenosis left MCA w/ angiogram rec- but wasn't done; mod stenosis at origin of left vertebral art w/ tortuosity... ~  Carotid arteriogram 12/10 showed stable mild residual left middle cerebral art stenosis at site of prev angioplasty, and stable 1.5 to 29mm saccular right middle cerebral art aneurysm... ~  10/12:  she remains asymptomatic- w/o cerebral ischemic symptoms... ~  10/13:  she continues stable on ASA/ Plavix w/o cerebral ischemic symptoms... ~  3/15 & 9/15: on ASA325 & Plavix75; s/p aneurysm & TIA; stable w/o cerebral ischemic symptoms- she has not had f/u DrDeveshwar-IR- she notes she was told no  need for f/u angiography (we might consider MRI later) ~  3/16: on [place]; she denies cerebral ischemic symptoms & is stable... ~  9/16: stable on ASA/ Plavix and we decided to repeat her MRI/ MRA => pending  HYPERCHOLESTEROLEMIA (ICD-272.0) >>  ~  FLP 3/08 shows Tchol 180, TG 206, HDL 49, LDL 105... rec same meds, better diet, get wt down. ~  [place] 10/09 on Lip80 showed TChol 150, TG 178, HDL 41, LDL 74 ~  FLP 1/11 on Lip80+Zetia10 showed TChol 162, TG 226, HDL 47, LDL 95... she wants to stop Zetia, get on diet & get wt down. ~  FLP 1/12 on Lip80 showed TChol 173, TG 164, HDL 45, LDL 95 ~  FLP 10/13 on Lip80 intermittently showed TChol 189, TG 231, HDL 45, LDL 112... She was c/o paresthesias that she was convinced was due to the Lip80 & she requested change to CRESTOR 20mg /d... ~  [place] 4/14 on Cres20 showed TChol 167, TG 141, HDL 46, LDL 93 ~  FLP 3/15 on Cres20 showed TChol 164, TG 116, HDL 49, LDL 92 ~  FLP 3/16 on Cres20 showed TChol 174, TG 200, HDL 45, LDL 89; we reviewed diet, exercise, wt reduction...  HYPOTHYROIDISM (ICD-244.9) - on SYNTHROID 152mcg/d... ~  labs 3/08 showed TSH = 0.23 ~  labs 10/09 showed TSH= 0.64 ~  labs 1/11 showed TSH= 0.18... she wants to keep same dose to aide wt reduction. ~  labs 1/12 showed TSH= 0.20... Ditto ~  Labs 10/13 on Levo125 showed TSH= 1.20 ~  Labs 3/15 on Synthroid125 showed TSH= 0.35 ~  Labs 3/16 on Synthroid125 showed TSH= 0.11; we decided to step down her Synthroid dose to 141mcg/d...  COLONIC POLYPS (ICD-211.3), & Hx of HEMORRHOIDS (ICD-455.6) ~  colonoscopy 10/00 by DrDBrodie showed hems only... f/u planned 73yrs. ~  f/u colonoscopy 11/10 showed 2 polyps- one adenomatous w/ f/u planned 64yrs. ~  she had f/u colonoscopy 6/16 by DrDBrodie- 59mm sessile polyp removed from the desc colon=> tubular adenoma & they rec f/u colon  in 5 yrs...  DEGENERATIVE JOINT DISEASE (ICD-715.90) - uses Tylenol & OTC meds as needed... in 2004 seen by  [facility] w/ soft tissue hemangioma found in right shoulder area... second opinion from DrWWard @  [facility] confirmed this- no surg necessary... ~  1/11:  notes some pain in hands & wrist... ~  1/12:  Stable w/o acute complaints or problem areas... ~  2014: she developed LBP & hip pain while receiving XRT for her breast cancer; eval by DrMagrinat & DrCaffrey w/ epid steroid shot that really helped... ~  She has a scoliosis and compression deformity in lower TSpine...  Hx of FIBROMYALGIA (ICD-729.1)  OSTEOPOROSIS (ICD-733.00) - she indicates that this is followed & managed by GYN, DrCousins> ?when last BMD was done "she keeps up w/ this" > on Calcium, & Vitamins... ~  labs 1/11 showed Vit D level = 54... ~  Pt again reiterates that GYN follows her BMDs & is up to date... ~  BMD done 3/16 at [facility] w/ lowest Tscore-1.5 in distal left radius; encouraged to continue supplements and wt bearing exercise...  TIA (ICD-435.9) - as above, she remains on ASA325 & PLAVIX75... last saw DrReynolds in 2006... 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 arteriogram by Dr. Fleeta Emmer confirmed a web-like plaque in the left M1 segment of the left MCA with signif stenosis...  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  ANXIETY (ICD-300.00) - on EFFEXOR 75mg /d for hot flashes, she says.... she wishes to continue the med "it keeps me on an even keel".  Health Maintenance - GYN= DrCousins & she will call for f/u... Mammograms at [facility]... BMDs at [facility] & results sent to DrCousins... ~  Immunizations: she refuses Flu vaccine... given PNEUMOVAX- 1/11, and Tdap- 1/11...   Past Surgical History:  Procedure Laterality Date  . BREAST BIOPSY Right 10/23/2012  . BREAST  BIOPSY Right 10/08/2012  . COLONOSCOPY    . COLONOSCOPY W/ POLYPECTOMY    . ESOPHAGOGASTRODUODENOSCOPY    . Left middle cerebral artery angioplasty  2004   by [name] had 2 follow up occurances  . MASTECTOMY Right     Outpatient Encounter Medications as of 06/18/2017  Medication Sig  . aspirin EC 325 MG tablet Take 325 mg by mouth every morning.   . Calcium Carbonate-Vitamin D (CALTRATE 600+D PO) Take 600 mg by mouth 2 (two) times daily.  . clopidogrel (PLAVIX) 75 MG tablet TAKE 1 TABLET(75 MG) BY MOUTH DAILY  . lisinopril (PRINIVIL,ZESTRIL) 20 MG tablet Take 1 tablet (20 mg total) by mouth daily.  . metoprolol succinate (TOPROL-XL) 100 MG 24 hr tablet Take 1 tablet (100 mg total) by mouth daily. Take with or immediately following a meal.  . metroNIDAZOLE (METROCREAM) 0.75 % cream Apply 1 application topically 2 (two) times daily. For rosacea  . Multiple Vitamin (MULTIVITAMIN WITH MINERALS) TABS tablet Take 1 tablet by mouth every morning. Centrum Silver  . rosuvastatin (CRESTOR) 20 MG tablet Take 1 tablet (20 mg total) by mouth daily.  . sertraline (ZOLOFT) 100 MG tablet Take 1 tablet (100 mg total) by mouth daily.  . . SYNTHROID 112 MCG tablet TAKE ONE TABLET BY MOUTH ONCE DAILY BEFORE  BREAKFAST  . cetirizine (ZYRTEC) 10 MG tablet Take 10 mg  by mouth daily.  . . . Tetrahydrozoline HCl (VISINE OP) Place 1 drop into both eyes daily as needed (dry eyes).  . .  No facility-administered encounter medications on file as of 06/18/2017.     No Known Allergies   Immunization History  Administered Date(s) Administered  . Influenza Split 05/28/2012  . Influenza, High Dose Seasonal PF 07/13/2016, 06/18/2017  . Influenza,inj,Quad PF,6+ Mos 06/05/2013, 05/13/2014, 04/20/2015  . Pneumococcal Polysaccharide-23 08/26/2009  . Td 08/26/2009    Current Medications, Allergies, Past Medical History, Past Surgical History, Family History, and Social History were reviewed in [employer] record.     She is an ex-smoker & quit in 1987 w/ ~20 pack-yr smoking hx...    Several family members have had cancer: Mother (nonsmoker) died at 72 w/ lung cancer- one of her sisters also had lung cancer, and a brother had colon cancer (both dx in 42s); Katelin had one brother who died from colon cancer at 37, & that bro had a daughter who was dx w/ uterine cancer at 102;  Pt also has one sister dx w/ breast cancer in 2015... NOTE: pt declined genetic testing...   Review of Systems    Constitutional:  Denies F/C/S, appetite is good, weight is stable ~150# HEENT:  No HA, visual changes, earache, nasal symptoms, sore throat, hoarseness. Resp:  No cough, sputum, hemoptysis; +SOB/DOE w/o tightness or wheezing. Cardio:  No CP, palpit, orthopnea, edema; notes some DOE GI:  Denies N/V/D/C or blood in stool; no reflux, abd pain, distention, or gas. GU:  No dysuria, freq, urgency, hematuria, or flank pain. MS:  Denies joint pain, swelling, tenderness; no neck pain, back pain, etc. Neuro:  No tremors, seizures, dizziness, syncope; prev weakness & numbness improved, gait is ok... Skin:  No suspicious lesions or skin rash. Heme:  No bruising, bleeding, etc; axillary adenopathy is resolved Psyche: Denies confusion, sleep disturbance, hallucinations, anxiety, depression.   Objective:   Physical Exam      WD, WN, 76 y/o WF in NAD... GENERAL:  Alert & oriented; pleasant & cooperative... HEENT:  [place]/AT, EOM-wnl, PERRLA, EACs-clear, TMs-wnl, NOSE-clear, THROAT-clear & wnl. NECK:  Supple w/ fairROM; no JVD; normal carotid impulses w/o bruits; no thyromegaly or nodules palpated; no lymphadenopathy. CHEST:  Clear to P&A w/o wheezing, rales, or rhonchi detected... HEART:  Regular Rhythm; without murmurs/ rubs/ or gallops. ABDOMEN:  Soft & nontender; normal bowel sounds; no organomegaly or masses palpated. EXT: without deformities, mild arthritic changes; no varicose veins/ +venous insuffic/  no edema. NEURO:  CN's intact;  no focal neuro deficits... DERM:  No lesions noted; no rash etc...  RADIOLOGY DATA:  Reviewed in the EPIC EMR & discussed w/ the patient...  LABORATORY DATA:  Reviewed in the EPIC EMR & discussed w/ the patient...   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 She continues f/u w/ DrMagrinat & DrWakefield yearly...   ASTHMATIC BRONCHITIS>  No recent URIs or resp exac & she is once again not on regular meds, denies breathing problems, etc... Abn CXR>  Faint GG opacity in RUL has resolved (likely related to XRT); she is at baseline, stable... 09/20/16>   Dinora has an upper resp infection/ ILI/ AB exac=> discussed treatment w/ Depo80, Pred taper (see AVS), Levaquin500/d, Mucinex600Qid w/ fluids, & Hycodan prn cough   HBP>  Controlled on BBlocker, & ACE,  she continues to watch BP at home...  Cerebrovasc Dis>  She had TIA 2004 w/ eval revealing left MCA stenosis; DrDeveshwar did PTA w/ good result; incidental 1-45mm aneurysm noted in the right MCA; she has remained asymptomatic since 2004 on the ASA/ Plavix. We will f/u MRI / MRA 9/16 => pending  CHOL>  Controlled on Cres20 (she was intol to Lip80), needs better low fat diet...  HPYOTHYROID>  On Synthroid 139mcg/d w/ TSH = 0/11 so we will step down her dose to 152mcg/d...  Other medical problems as noted... 10/18/15>   Ifeoluwa has been stable & the right breast scare from 11/16 turned out to be inflammed adipose tissue/ scar;  Labs are OK & she continues to work on diet/ exercise, continue same meds; we discussed ROV recheck in 46mo.     Medication List        Accurate as of 06/18/17  1:25 PM. Always use your most recent med list.          aspirin EC 325 MG tablet   CALTRATE 600+D PO   cetirizine 10 MG tablet Commonly known as:  ZYRTEC   clopidogrel 75 MG  tablet Commonly known as:  PLAVIX TAKE 1 TABLET(75 MG) BY MOUTH DAILY   HYDROcodone-homatropine 5-1.5 MG/5ML syrup Commonly known as:  HYCODAN Take 5 mLs by mouth every 6 (six) hours as needed for cough.   levofloxacin 500 MG tablet Commonly known as:  LEVAQUIN Take 1 tablet (500 mg total) by mouth daily.   lisinopril 20 MG tablet Commonly known as:  PRINIVIL,ZESTRIL Take 1 tablet (20 mg total) by mouth daily.   metoprolol succinate 100 MG 24 hr tablet Commonly known as:  TOPROL-XL Take 1 tablet (100 mg total) by mouth daily. Take with or immediately following a meal.   metroNIDAZOLE 0.75 % cream Commonly known as:  METROCREAM   multivitamin with minerals Tabs tablet   rosuvastatin 20 MG tablet Commonly known as:  CRESTOR Take 1 tablet (20 mg total) by mouth daily.   * sertraline 50 MG tablet Commonly known as:  ZOLOFT   * sertraline 100 MG tablet Commonly known as:  ZOLOFT Take 1 tablet (100 mg total) by mouth daily.   SYNTHROID 112 MCG tablet Generic drug:  levothyroxine TAKE ONE TABLET BY MOUTH ONCE DAILY BEFORE  BREAKFAST   venlafaxine XR 75 MG 24 hr capsule Commonly known as:  EFFEXOR-XR TAKE ONE CAPSULE BY MOUTH ONCE DAILY   VISINE OP      * This list has 2 medication(s) that are the same as other medications prescribed for you. Read the directions carefully, and ask your doctor or other care provider to review them with you.

## 2017-06-18 NOTE — Patient Instructions (Addendum)
Strengthening: Resisted Flexion    Cancer Rehab [phone]    Hold tubing with left arm at side. Pull forward and up. Move shoulder through pain-free range of motion. Repeat _5-10___ times per set. Do _1-2___ sessions per day.  Strengthening: Resisted Internal Rotation    Hold tubing in left hand, elbow at side and forearm out. Rotate forearm in across body. Repeat _5-10___ times per set. Do _1-2___ sessions per day.  Strengthening: Resisted Extension    Hold tubing in left hand, arm forward. Pull arm back, elbow straight. Repeat __5-10__ times per set. Do __1-2__ sessions per day.   Strengthening: Resisted External Rotation    Hold tubing in left hand, elbow at side and forearm across body. Rotate forearm out. Repeat _5-10___ times per set. Do __1-2__ sessions per day.    Repeat on Right Side  Do wall pushups x 10 reps x 1x/day  3 Way Raises:      Starting Position:  Leaning against wall, walk feet a few inches away from the wall and make tummy tight (tuck hips underneath you) Press back/shoulders/head against wall as much as possible. Keep thumbs up to ceiling, elbows straight and shoulders relaxed/down throughout.  1. Lift arms in front to shoulder height 2. Lift arms a little wider into a "V" to shoulder height 3. Lift arms out to sides in a "T" to shoulder height  Perform 10 times in each direction. Hold 1-2 lbs to start with and work up to 2-3 sets of 10/day. Perform 3-4 times/week. Increase weight as able, decreasing sets of 10 each time you increase weights, then slowly working your way back up to 2-3 sets each time.    Cancer Rehab [phone]

## 2017-06-18 NOTE — Therapy (Signed)
[place], [place], [address] Phone: [phone]   Fax:  [phone]  Physical Therapy Treatment  Patient Details  Name: Debra Mcintyre MRN: [id number] Date of Birth: 17-Apr-1941 Referring Provider: Magrinat   Encounter Date: 06/18/2017  PT End of Session - 06/18/17 1707    Visit Number  12    Number of Visits  17    Date for PT Re-Evaluation  06/27/17    PT Start Time  0488    PT Stop Time  1516    PT Time Calculation (min)  43 min    Activity Tolerance  Patient tolerated treatment well    Behavior During Therapy  [facility] for tasks assessed/performed       Past Medical History:  Diagnosis Date  . Breast cancer ([place]) 09/2012   right breast/right axillary lymph node t2,pn1, stage 11b, invasive ductal carcinma, grade 3, triple negative, with an mib-1 of 15%  . Cerebral aneurysm, nonruptured   . Cerebrovascular disease, unspecified   . Colonic polyp   . Dizziness    pt. states that she experiences this occassionally 05/03/15  . Headache    occassionally  . Heart murmur   . History of chemotherapy   . Hx of radiation therapy 05/27/13-08/13/13   right chest wall/right supraclavicular/axillary region 5220 cGy 29 sessions, right mastectomy/chest wall boost cGy 5 sessions  . Hypertension    Does not see a cardiologist  . Hypothyroidism   . Neuropathy   . Pure hypercholesterolemia   . Rosacea   . Seasonal allergies   . TIA (transient ischemic attack)   . Wears glasses     Past Surgical History:  Procedure Laterality Date  . BREAST BIOPSY Right 10/23/2012  . BREAST BIOPSY Right 10/08/2012  . COLONOSCOPY    . COLONOSCOPY W/ POLYPECTOMY    . ESOPHAGOGASTRODUODENOSCOPY    . Left middle cerebral artery angioplasty  2004   by [name] had 2 follow up occurances  . MASTECTOMY Right     There were no vitals filed for this visit.  Subjective Assessment - 06/18/17 1435    Subjective  My  shoulders feel fine today.    Pertinent History  Biopsy of the larger axillary lymph node on 10/07/2012 showed ([id number] [id number]) an invasive ductal carcinoma, triple negative, with an MIB-1 of 66%. Biopsy of the right breast the next day, SAA [id number]) showed invasive ductal carcinoma, grade 3. Breast MRI 10/14/2012 showed a total area of irregular enhancement in the right breast measuring up to 12 cm. MRI guided biopsy of an area in the upper inner quadrant of the right breast on 10/23/2012 showed ([id number] [id number]) ductal carcinoma in situ, with foci worrisome for invasion., 04/23/2013 R total mastectomy with SLNB, completed chemo and radiation    Patient Stated Goals  to decrease the pain, improve ROM    Currently in Pain?  No/denies    Pain Score  0-No pain                      [facility] Adult PT Treatment/Exercise - 06/18/17 0001      Shoulder Exercises: Standing   Other Standing Exercises  3 way shoulder with 2 lb weights x 10 each    Other Standing Exercises  wall pushups x 10, rockwood exercises with red band x 10 each      Shoulder Exercises: Pulleys   Flexion  2 minutes    ABduction  2 minutes  Manual Therapy   Manual Therapy  Passive ROM    Passive ROM  to R shoulder in direction of flexion, abduction, ER, IR                     Long Term Clinic Goals - 06/15/17 1028      CC Long Term Goal  #1   Title  Pt to report a 70% improvement in pain in R lateral trunk to allow improved comfort    Baseline  04/25/17- 30% improvement, 05/30/17- 100% improvement    Time  5    Period  Weeks    Status  Achieved      CC Long Term Goal  #2   Title  Pt to demonstrate 155 degrees of right shoulder flexion to allow her to reach items in cabinets    Baseline  131, 04/25/17- 134 degrees, 05/14/17- 149 degrees, 05/30/17- 137, 06/15/17- 158    Time  5    Period  Weeks    Status  Achieved      CC Long Term Goal  #3   Title  Pt to demonstrate 165 degrees of right shoulder  abduction to allow her to reach items out to sides    Baseline  110, 04/25/17- 114 degrees, 05/14/17- 131 degrees, 05/30/17- 112 degrees, 06/15/17- 140 degrees    Time  5    Period  Weeks    Status  On-going      CC Long Term Goal  #4   Title  Pt to demonstrate 60 degrees of right shoulder IR to allow pt to return to prior level of function    Baseline  38, 04/25/17- 53 degrees with p!, 05/14/17- 56 degrees, 05/30/17- 56 degrees, 06/15/17- 76 degrees    Time  5    Period  Weeks    Status  Achieved      CC Long Term Goal  #5   Title  Pt to be independent in a home exercise program for continued strengthening and stretching    Time  5    Period  Weeks    Status  On-going         Plan - 06/18/17 1707    Clinical Impression Statement  Added new strengthening exercises to pt's home exercise program today including rockwood exercises, 3 way shoulder raises and wall pushups. Printed these off for pt to complete at home. Continued with PROM to further improve right shoulder ROM. Gave pt rx for compression sleeve to take and be measured.     Rehab Potential  Good    Clinical Impairments Affecting Rehab Potential  hx of radiation    PT Frequency  2x / week    PT Duration  4 weeks    PT Treatment/Interventions  ADLs/Self Care Home Management;Patient/family education;Manual techniques;Therapeutic exercise;Manual lymph drainage;Scar mobilization;Passive range of motion;Taping    PT Next Visit Plan  continue strengthening exercises and PROM    PT Home Exercise Plan  supine scap, rockwood, wall pushups, 3 way shoulder    Consulted and Agree with Plan of Care  Patient       Patient will benefit from skilled therapeutic intervention in order to improve the following deficits and impairments:  Increased edema, Decreased knowledge of precautions, Impaired UE functional use, Decreased strength, Decreased range of motion, Decreased scar mobility, Postural dysfunction, Pain, Increased fascial  restricitons  Visit Diagnosis: Stiffness of right shoulder, not elsewhere classified  Chronic right shoulder pain  Abnormal posture  Muscle weakness (generalized)     Problem List Patient Active Problem List   Diagnosis Date Noted  . Malignant neoplasm of upper-outer quadrant of right breast in female, estrogen receptor negative ([place]) 05/13/2013  . Anemia, unspecified 02/24/2013  . GERD (gastroesophageal reflux disease) 12/16/2012  . Asthmatic bronchitis 04/21/2011  . INTRACRANIAL ANEURYSM 08/28/2009  . Cerebrovascular disease 08/28/2009  . COLONIC POLYPS 08/26/2009  . Essential hypertension 05/12/2008  . HEMORRHOIDS 05/12/2008  . Hypothyroidism 12/02/2007  . HYPERCHOLESTEROLEMIA 12/02/2007  . Anxiety state 12/02/2007  . Transient cerebral ischemia 12/02/2007  . Osteoarthritis 12/02/2007  . FIBROMYALGIA 12/02/2007  . Osteoporosis 12/02/2007    Allyson Sabal [facility] 06/18/2017, 5:10 PM  [place], [place], [address] Phone: [phone]   Fax:  [phone]  Name: IREANNA FINLAYSON MRN: [id number] Date of Birth: [birth year]  Manus Gunning, PT 06/18/17 5:11 PM

## 2017-06-19 ENCOUNTER — Encounter: Payer: Self-pay | Admitting: Physical Therapy

## 2017-06-19 ENCOUNTER — Ambulatory Visit: Payer: Medicare Other | Admitting: Physical Therapy

## 2017-06-19 DIAGNOSIS — R293 Abnormal posture: Secondary | ICD-10-CM

## 2017-06-19 DIAGNOSIS — M25511 Pain in right shoulder: Secondary | ICD-10-CM | POA: Diagnosis not present

## 2017-06-19 DIAGNOSIS — G8929 Other chronic pain: Secondary | ICD-10-CM | POA: Diagnosis not present

## 2017-06-19 DIAGNOSIS — M6281 Muscle weakness (generalized): Secondary | ICD-10-CM | POA: Diagnosis not present

## 2017-06-19 DIAGNOSIS — M25611 Stiffness of right shoulder, not elsewhere classified: Secondary | ICD-10-CM | POA: Diagnosis not present

## 2017-06-19 NOTE — Therapy (Signed)
[place], [place], [address] Phone: [phone]   Fax:  [phone]  Physical Therapy Treatment  Patient Details  Name: Debra Mcintyre MRN: [id number] Date of Birth: 1941-02-09 Referring Provider: Magrinat   Encounter Date: 06/19/2017  PT End of Session - 06/19/17 1517    Visit Number  13    Number of Visits  17    Date for PT Re-Evaluation  06/27/17    PT Start Time  5462    PT Stop Time  1517    PT Time Calculation (min)  43 min    Activity Tolerance  Patient tolerated treatment well    Behavior During Therapy  [facility] for tasks assessed/performed       Past Medical History:  Diagnosis Date  . Breast cancer ([place]) 09/2012   right breast/right axillary lymph node t2,pn1, stage 11b, invasive ductal carcinma, grade 3, triple negative, with an mib-1 of 15%  . Cerebral aneurysm, nonruptured   . Cerebrovascular disease, unspecified   . Colonic polyp   . Dizziness    pt. states that she experiences this occassionally 05/03/15  . Headache    occassionally  . Heart murmur   . History of chemotherapy   . Hx of radiation therapy 05/27/13-08/13/13   right chest wall/right supraclavicular/axillary region 5220 cGy 29 sessions, right mastectomy/chest wall boost cGy 5 sessions  . Hypertension    Does not see a cardiologist  . Hypothyroidism   . Neuropathy   . Pure hypercholesterolemia   . Rosacea   . Seasonal allergies   . TIA (transient ischemic attack)   . Wears glasses     Past Surgical History:  Procedure Laterality Date  . BREAST BIOPSY Right 10/23/2012  . BREAST BIOPSY Right 10/08/2012  . COLONOSCOPY    . COLONOSCOPY W/ POLYPECTOMY    . ESOPHAGOGASTRODUODENOSCOPY    . Left middle cerebral artery angioplasty  2004   by [name] had 2 follow up occurances  . MASTECTOMY Right     There were no vitals filed for this visit.  Subjective Assessment - 06/19/17 1435    Subjective  My  shoulder felt very good after yesterday. I can now reach the pulleys in the gym.     Pertinent History  Biopsy of the larger axillary lymph node on 10/07/2012 showed ([id number] [id number]) an invasive ductal carcinoma, triple negative, with an MIB-1 of 66%. Biopsy of the right breast the next day, [id number] [id number]) showed invasive ductal carcinoma, grade 3. Breast MRI 10/14/2012 showed a total area of irregular enhancement in the right breast measuring up to 12 cm. MRI guided biopsy of an area in the upper inner quadrant of the right breast on 10/23/2012 showed ([id number] [id number]) ductal carcinoma in situ, with foci worrisome for invasion., 04/23/2013 R total mastectomy with SLNB, completed chemo and radiation    Patient Stated Goals  to decrease the pain, improve ROM    Currently in Pain?  No/denies    Pain Score  0-No pain         [facility] PT Assessment - 06/19/17 0001      AROM   Right Shoulder Flexion  149 Degrees    Right Shoulder ABduction  155 Degrees                  [facility] Adult PT Treatment/Exercise - 06/19/17 0001      Shoulder Exercises: Standing   External Rotation  Strengthening;Both;10 reps;Theraband    Theraband Level (Shoulder  External Rotation)  Level 2 (Red)    Internal Rotation  Strengthening;Both;10 reps;Theraband needs cueing to keep body from moving   needs cueing to keep body from moving   Theraband Level (Shoulder Internal Rotation)  Level 2 (Red)    Flexion  Strengthening;Both;10 reps;Theraband    Theraband Level (Shoulder Flexion)  Level 2 (Red)    Extension  Strengthening;Both;10 reps;Theraband    Theraband Level (Shoulder Extension)  Level 2 (Red)    Other Standing Exercises  3 way shoulder with 2 lb weights x 10 each    Other Standing Exercises  wall pushups x 10      Shoulder Exercises: Pulleys   Flexion  2 minutes    ABduction  2 minutes      Shoulder Exercises: Stretch   Other Shoulder Stretches  laying over foam roll with arms abducted to 90      Manual  Therapy   Manual Therapy  Passive ROM    Passive ROM  to R shoulder in direction of flexion, abduction, ER, IR                     Long Term Clinic Goals - 06/19/17 1438      CC Long Term Goal  #1   Title  Pt to report a 70% improvement in pain in R lateral trunk to allow improved comfort    Baseline  04/25/17- 30% improvement, 05/30/17- 100% improvement    Time  5    Period  Weeks    Status  Achieved      CC Long Term Goal  #2   Title  Pt to demonstrate 155 degrees of right shoulder flexion to allow her to reach items in cabinets    Baseline  131, 04/25/17- 134 degrees, 05/14/17- 149 degrees, 05/30/17- 137, 06/15/17- 158    Time  5    Period  Weeks    Status  Achieved      CC Long Term Goal  #3   Title  Pt to demonstrate 165 degrees of right shoulder abduction to allow her to reach items out to sides    Baseline  110, 04/25/17- 114 degrees, 05/14/17- 131 degrees, 05/30/17- 112 degrees, 06/15/17- 140 degrees, 06/19/17- 155 degrees    Time  5    Period  Weeks    Status  On-going      CC Long Term Goal  #4   Title  Pt to demonstrate 60 degrees of right shoulder IR to allow pt to return to prior level of function    Baseline  38, 04/25/17- 53 degrees with p!, 05/14/17- 56 degrees, 05/30/17- 56 degrees, 06/15/17- 76 degrees    Time  5    Period  Weeks    Status  Achieved      CC Long Term Goal  #5   Title  Pt to be independent in a home exercise program for continued strengthening and stretching    Time  5    Period  Weeks    Status  On-going         Plan - 06/19/17 1450    Clinical Impression Statement  Pt's flexion ROM has improved since last session by almost 10 degrees. She is still limited with abduction and is 10 degrees shy of meeting that goal. Encouraged pt to focus more on abduction when doing pulleys at the gym. Continued with strengthening exercises and PROM today.     Rehab Potential  Good  Clinical Impairments Affecting Rehab Potential  hx of radiation     PT Frequency  2x / week    PT Duration  4 weeks    PT Treatment/Interventions  ADLs/Self Care Home Management;Patient/family education;Manual techniques;Therapeutic exercise;Manual lymph drainage;Scar mobilization;Passive range of motion;Taping    PT Next Visit Plan  continue strengthening exercises and PROM    PT Home Exercise Plan  supine scap, rockwood, wall pushups, 3 way shoulder    Consulted and Agree with Plan of Care  Patient       Patient will benefit from skilled therapeutic intervention in order to improve the following deficits and impairments:  Increased edema, Decreased knowledge of precautions, Impaired UE functional use, Decreased strength, Decreased range of motion, Decreased scar mobility, Postural dysfunction, Pain, Increased fascial restricitons  Visit Diagnosis: Stiffness of right shoulder, not elsewhere classified  Chronic right shoulder pain  Abnormal posture  Muscle weakness (generalized)     Problem List Patient Active Problem List   Diagnosis Date Noted  . Malignant neoplasm of upper-outer quadrant of right breast in female, estrogen receptor negative ([place]) 05/13/2013  . Anemia, unspecified 02/24/2013  . GERD (gastroesophageal reflux disease) 12/16/2012  . Asthmatic bronchitis 04/21/2011  . INTRACRANIAL ANEURYSM 08/28/2009  . Cerebrovascular disease 08/28/2009  . COLONIC POLYPS 08/26/2009  . Essential hypertension 05/12/2008  . HEMORRHOIDS 05/12/2008  . Hypothyroidism 12/02/2007  . HYPERCHOLESTEROLEMIA 12/02/2007  . Anxiety state 12/02/2007  . Transient cerebral ischemia 12/02/2007  . Osteoarthritis 12/02/2007  . FIBROMYALGIA 12/02/2007  . Osteoporosis 12/02/2007    Allyson Sabal [facility] 06/19/2017, 3:18 PM  [place], [place], [address] Phone: [phone]   Fax:  [phone]  Name: MANHA AMATO MRN: [id number] Date of Birth: 05-07-1941  Manus Gunning, PT 06/19/17 3:18 PM

## 2017-06-20 ENCOUNTER — Encounter: Payer: Medicare Other | Admitting: Physical Therapy

## 2017-06-22 ENCOUNTER — Other Ambulatory Visit (INDEPENDENT_AMBULATORY_CARE_PROVIDER_SITE_OTHER): Payer: Medicare Other

## 2017-06-22 DIAGNOSIS — F411 Generalized anxiety disorder: Secondary | ICD-10-CM | POA: Diagnosis not present

## 2017-06-22 DIAGNOSIS — E559 Vitamin D deficiency, unspecified: Secondary | ICD-10-CM

## 2017-06-22 DIAGNOSIS — E7800 Pure hypercholesterolemia, unspecified: Secondary | ICD-10-CM | POA: Diagnosis not present

## 2017-06-22 LAB — LIPID PANEL
CHOLESTEROL: 159 mg/dL (ref 0–200)
HDL: 43.6 mg/dL (ref >39.00)
LDL CALC: 81 mg/dL (ref 0–99)
NONHDL: 115.87
Total CHOL/HDL Ratio: 4
Triglycerides: 176 mg/dL — ABNORMAL HIGH (ref 0.0–149.0)
VLDL: 35.2 mg/dL (ref 0.0–40.0)

## 2017-06-22 LAB — TSH: TSH: 0.39 u[IU]/mL (ref 0.35–4.50)

## 2017-06-22 LAB — VITAMIN D 25 HYDROXY (VIT D DEFICIENCY, FRACTURES): VITD: 40.82 ng/mL (ref 30.00–100.00)

## 2017-06-25 ENCOUNTER — Ambulatory Visit: Payer: Medicare Other | Admitting: Physical Therapy

## 2017-06-25 ENCOUNTER — Encounter: Payer: Self-pay | Admitting: Physical Therapy

## 2017-06-25 DIAGNOSIS — M6281 Muscle weakness (generalized): Secondary | ICD-10-CM

## 2017-06-25 DIAGNOSIS — M25511 Pain in right shoulder: Secondary | ICD-10-CM

## 2017-06-25 DIAGNOSIS — M25611 Stiffness of right shoulder, not elsewhere classified: Secondary | ICD-10-CM | POA: Diagnosis not present

## 2017-06-25 DIAGNOSIS — R293 Abnormal posture: Secondary | ICD-10-CM

## 2017-06-25 DIAGNOSIS — G8929 Other chronic pain: Secondary | ICD-10-CM

## 2017-06-25 NOTE — Therapy (Signed)
[place], [place], [address] Phone: [phone]   Fax:  [phone]  Physical Therapy Treatment  Patient Details  Name: Debra Mcintyre MRN: [id number] Date of Birth: 1940-09-01 Referring Provider: Magrinat   Encounter Date: 06/25/2017  PT End of Session - 06/25/17 1654    Visit Number  14    Number of Visits  17    Date for PT Re-Evaluation  06/27/17    PT Start Time  2353    PT Stop Time  1515    PT Time Calculation (min)  41 min    Activity Tolerance  Patient tolerated treatment well    Behavior During Therapy  [facility] for tasks assessed/performed       Past Medical History:  Diagnosis Date  . 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 Cerebral aneurysm, nonruptured   . Cerebrovascular disease, unspecified   . Colonic polyp   . Dizziness    pt. states that she experiences this occassionally 05/03/15  . Headache    occassionally  . Heart murmur   . History of chemotherapy   . Hx of radiation therapy 05/27/13-08/13/13   right chest wall/right supraclavicular/axillary region 5220 cGy 29 sessions, right mastectomy/chest wall boost cGy 5 sessions  . Hypertension    Does not see a cardiologist  . Hypothyroidism   . Neuropathy   . Pure hypercholesterolemia   . Rosacea   . Seasonal allergies   . TIA (transient ischemic attack)   . Wears glasses     Past Surgical History:  Procedure Laterality Date  . BREAST BIOPSY Right 10/23/2012  . BREAST BIOPSY Right 10/08/2012  . COLONOSCOPY    . COLONOSCOPY W/ POLYPECTOMY    . ESOPHAGOGASTRODUODENOSCOPY    . Left middle cerebral artery angioplasty  2004   by [name] had 2 follow up occurances  . MASTECTOMY Right     There were no vitals filed for this visit.  Subjective Assessment - 06/25/17 1436    Subjective  My  shoulder is good. I have some pain when I reach across my chest.     Pertinent History  Biopsy of the larger axillary lymph node on 10/07/2012 showed ([id number]) an invasive ductal carcinoma, triple negative, with an MIB-1 of 66%. Biopsy of the right breast the next day, [id number] [id number]) showed invasive ductal carcinoma, grade 3. Breast MRI 10/14/2012 showed a total area of irregular enhancement in the right breast measuring up to 12 cm. MRI guided biopsy of an area in the upper inner quadrant of the right breast on 10/23/2012 showed ([id number]) ductal carcinoma in situ, with foci worrisome for invasion., 04/23/2013 R total mastectomy with SLNB, completed chemo and radiation    Patient Stated Goals  to decrease the pain, improve ROM    Currently in Pain?  No/denies    Pain Score  0-No pain                      [facility] Adult PT Treatment/Exercise - 06/25/17 0001      Shoulder Exercises: Standing   External Rotation  Strengthening;Both;10 reps;Theraband    Theraband Level (Shoulder External Rotation)  Level 2 (Red)    Internal Rotation  Strengthening;Both;10 reps;Theraband needs cueing to keep body from moving    Theraband Level (Shoulder Internal Rotation)  Level 2 (Red)  Flexion  Strengthening;Both;10 reps;Theraband    Theraband Level (Shoulder Flexion)  Level 2 (Red)    Extension  Strengthening;Both;10 reps;Theraband    Theraband Level (Shoulder Extension)  Level 2 (Red)    Other Standing Exercises  3 way shoulder with 2 lb weights x 10 each    Other Standing Exercises  wall pushups x 10      Shoulder Exercises: Pulleys   Flexion  2 minutes    ABduction  2 minutes      Manual Therapy   Manual Therapy  Passive ROM    Soft tissue mobilization  to R pec using biotone    Passive ROM  to R shoulder in direction of flexion, abduction, ER, IR                     Long Term Clinic Goals - 06/19/17 1438      CC Long Term Goal  #1   Title  Pt to report a 70%  improvement in pain in R lateral trunk to allow improved comfort    Baseline  04/25/17- 30% improvement, 05/30/17- 100% improvement    Time  5    Period  Weeks    Status  Achieved      CC Long Term Goal  #2   Title  Pt to demonstrate 155 degrees of right shoulder flexion to allow her to reach items in cabinets    Baseline  131, 04/25/17- 134 degrees, 05/14/17- 149 degrees, 05/30/17- 137, 06/15/17- 158    Time  5    Period  Weeks    Status  Achieved      CC Long Term Goal  #3   Title  Pt to demonstrate 165 degrees of right shoulder abduction to allow her to reach items out to sides    Baseline  110, 04/25/17- 114 degrees, 05/14/17- 131 degrees, 05/30/17- 112 degrees, 06/15/17- 140 degrees, 06/19/17- 155 degrees    Time  5    Period  Weeks    Status  On-going      CC Long Term Goal  #4   Title  Pt to demonstrate 60 degrees of right shoulder IR to allow pt to return to prior level of function    Baseline  38, 04/25/17- 53 degrees with p!, 05/14/17- 56 degrees, 05/30/17- 56 degrees, 06/15/17- 76 degrees    Time  5    Period  Weeks    Status  Achieved      CC Long Term Goal  #5   Title  Pt to be independent in a home exercise program for continued strengthening and stretching    Time  5    Period  Weeks    Status  On-going         Plan - 06/25/17 1655    Clinical Impression Statement  Pt is continuing to progress towards goals in therapy. She gained 10 degrees of AROM in R shoulder following PROM today. Pt has been having some pain in her right chest when reaching across with her right arm. Performed soft tissue mobilization to this area to help decrease pec tightness. Educated pt to stop strengthening exercises for pecs at gym.     Rehab Potential  Good    Clinical Impairments Affecting Rehab Potential  hx of radiation    PT Frequency  2x / week    PT Duration  4 weeks    PT Treatment/Interventions  ADLs/Self Care Home Management;Patient/family education;Manual techniques;Therapeutic  exercise;Manual lymph  drainage;Scar mobilization;Passive range of motion;Taping    PT Next Visit Plan  continue strengthening exercises and PROM    PT Home Exercise Plan  supine scap, rockwood, wall pushups, 3 way shoulder    Consulted and Agree with Plan of Care  Patient       Patient will benefit from skilled therapeutic intervention in order to improve the following deficits and impairments:  Increased edema, Decreased knowledge of precautions, Impaired UE functional use, Decreased strength, Decreased range of motion, Decreased scar mobility, Postural dysfunction, Pain, Increased fascial restricitons  Visit Diagnosis: Stiffness of right shoulder, not elsewhere classified  Chronic right shoulder pain  Abnormal posture  Muscle weakness (generalized)     Problem List Patient Active Problem List   Diagnosis Date Noted  . Malignant neoplasm of upper-outer quadrant of right breast in female, estrogen receptor negative ([place]) 05/13/2013  . Anemia, unspecified 02/24/2013  . GERD (gastroesophageal reflux disease) 12/16/2012  . Asthmatic bronchitis 04/21/2011  . INTRACRANIAL ANEURYSM 08/28/2009  . Cerebrovascular disease 08/28/2009  . COLONIC POLYPS 08/26/2009  . Essential hypertension 05/12/2008  . HEMORRHOIDS 05/12/2008  . Hypothyroidism 12/02/2007  . HYPERCHOLESTEROLEMIA 12/02/2007  . Anxiety state 12/02/2007  . Transient cerebral ischemia 12/02/2007  . Osteoarthritis 12/02/2007  . FIBROMYALGIA 12/02/2007  . Osteoporosis 12/02/2007    Allyson Sabal [facility] 06/25/2017, 4:58 PM  [place], [place], [address] Phone: [phone]   Fax:  [phone]  Name: Debra Mcintyre MRN: [id number] Date of Birth: [birth year]  Manus Gunning, PT 06/25/17 4:58 PM

## 2017-06-27 ENCOUNTER — Ambulatory Visit: Payer: Medicare Other | Admitting: Physical Therapy

## 2017-06-27 ENCOUNTER — Encounter: Payer: Self-pay | Admitting: Physical Therapy

## 2017-06-27 DIAGNOSIS — G8929 Other chronic pain: Secondary | ICD-10-CM

## 2017-06-27 DIAGNOSIS — M25611 Stiffness of right shoulder, not elsewhere classified: Secondary | ICD-10-CM | POA: Diagnosis not present

## 2017-06-27 DIAGNOSIS — R293 Abnormal posture: Secondary | ICD-10-CM

## 2017-06-27 DIAGNOSIS — M6281 Muscle weakness (generalized): Secondary | ICD-10-CM

## 2017-06-27 DIAGNOSIS — M25511 Pain in right shoulder: Secondary | ICD-10-CM

## 2017-06-27 NOTE — Therapy (Signed)
[place], [place], [address] Phone: [phone]   Fax:  [phone]  Physical Therapy Treatment  Patient Details  Name: Debra Mcintyre MRN: [id number] Date of Birth: [birth year] Referring Provider: Magrinat   Encounter Date: 06/27/2017  PT End of Session - 06/27/17 1610    Visit Number  15    Number of Visits  17    Date for PT Re-Evaluation  06/27/17    PT Start Time  9292    PT Stop Time  1515    PT Time Calculation (min)  42 min    Activity Tolerance  Patient tolerated treatment well    Behavior During Therapy  [facility] for tasks assessed/performed       Past Medical History:  Diagnosis Date  . 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 Cerebral aneurysm, nonruptured   . Cerebrovascular disease, unspecified   . Colonic polyp   . Dizziness    pt. states that she experiences this occassionally 05/03/15  . Headache    occassionally  . Heart murmur   . History of chemotherapy   . Hx of radiation therapy 05/27/13-08/13/13   right chest wall/right supraclavicular/axillary region 5220 cGy 29 sessions, right mastectomy/chest wall boost cGy 5 sessions  . Hypertension    Does not see a cardiologist  . Hypothyroidism   . Neuropathy   . Pure hypercholesterolemia   . Rosacea   . Seasonal allergies   . TIA (transient ischemic attack)   . Wears glasses     Past Surgical History:  Procedure Laterality Date  . BREAST BIOPSY Right 10/23/2012  . BREAST BIOPSY Right 10/08/2012  . COLONOSCOPY    . COLONOSCOPY W/ POLYPECTOMY    . ESOPHAGOGASTRODUODENOSCOPY    . Left middle cerebral artery angioplasty  2004   by [name] had 2 follow up occurances  . MASTECTOMY Right     There were no vitals filed for this visit.  Subjective Assessment - 06/27/17 1438    Subjective  My  shoulders are much better. I haven't had that pain since you did the massage.     Pertinent History  Biopsy of the larger axillary lymph node on 10/07/2012 showed ([id number]) an invasive ductal carcinoma, triple negative, with an MIB-1 of 66%. Biopsy of the right breast the next day, [id number] [id number]) showed invasive ductal carcinoma, grade 3. Breast MRI 10/14/2012 showed a total area of irregular enhancement in the right breast measuring up to 12 cm. MRI guided biopsy of an area in the upper inner quadrant of the right breast on 10/23/2012 showed ([id number] [id number]) ductal carcinoma in situ, with foci worrisome for invasion., 04/23/2013 R total mastectomy with SLNB, completed chemo and radiation    Patient Stated Goals  to decrease the pain, improve ROM    Currently in Pain?  No/denies    Pain Score  0-No pain                      [facility] Adult PT Treatment/Exercise - 06/27/17 0001      Shoulder Exercises: Standing   External Rotation  Strengthening;Both;10 reps;Theraband    Theraband Level (Shoulder External Rotation)  Level 2 (Red)    Internal Rotation  Strengthening;Both;10 reps;Theraband needs cueing to keep body from moving    Theraband Level (Shoulder Internal Rotation)  Level 2 (Red)  Flexion  Strengthening;Both;10 reps;Theraband    Theraband Level (Shoulder Flexion)  Level 2 (Red)    Extension  Strengthening;Both;10 reps;Theraband    Theraband Level (Shoulder Extension)  Level 2 (Red)    Other Standing Exercises  3 way shoulder with 2 lb weights x 10 each    Other Standing Exercises  wall pushups x 10      Shoulder Exercises: Pulleys   Flexion  2 minutes    ABduction  2 minutes      Manual Therapy   Manual Therapy  Passive ROM    Soft tissue mobilization  to R pec using biotone    Passive ROM  to R shoulder in direction of flexion, abduction, ER, IR                     Long Term Clinic Goals - 06/27/17 1611      CC Long Term Goal  #1   Title  Pt to  report a 70% improvement in pain in R lateral trunk to allow improved comfort    Baseline  04/25/17- 30% improvement, 05/30/17- 100% improvement    Time  5    Period  Days    Status  Achieved      CC Long Term Goal  #2   Title  Pt to demonstrate 155 degrees of right shoulder flexion to allow her to reach items in cabinets    Baseline  131, 04/25/17- 134 degrees, 05/14/17- 149 degrees, 05/30/17- 137, 06/15/17- 158    Time  5    Period  Weeks    Status  Achieved      CC Long Term Goal  #3   Title  Pt to demonstrate 165 degrees of right shoulder abduction to allow her to reach items out to sides    Baseline  110, 04/25/17- 114 degrees, 05/14/17- 131 degrees, 05/30/17- 112 degrees, 06/15/17- 140 degrees, 06/19/17- 155 degrees    Time  5    Period  Weeks    Status  Partially Met      CC Long Term Goal  #4   Title  Pt to demonstrate 60 degrees of right shoulder IR to allow pt to return to prior level of function    Baseline  38, 04/25/17- 53 degrees with p!, 05/14/17- 56 degrees, 05/30/17- 56 degrees, 06/15/17- 76 degrees    Time  5    Period  Weeks    Status  Achieved      CC Long Term Goal  #5   Title  Pt to be independent in a home exercise program for continued strengthening and stretching    Time  5    Period  Weeks    Status  Achieved         Plan - 06/27/17 1612    Clinical Impression Statement  Pt has now met all goals for therapy. Her abduction ROM goal was partially met but pt states she has had limited ROM in this shoulder for a while and is pleased with where her shoulder is. Pt states the pain in her chest has completely resolved after soft tissue mobilization last session. She has stopped pec strengthening exercises at the gym. Pt will be discharged from skilled PT services at this time.     Rehab Potential  Good    Clinical Impairments Affecting Rehab Potential  hx of radiation    PT Frequency  2x / week    PT Duration  4 weeks  PT Treatment/Interventions  ADLs/Self Care  Home Management;Patient/family education;Manual techniques;Therapeutic exercise;Manual lymph drainage;Scar mobilization;Passive range of motion;Taping    PT Next Visit Plan  dc this visit    PT Home Exercise Plan  supine scap, rockwood, wall pushups, 3 way shoulder    Consulted and Agree with Plan of Care  Patient       Patient will benefit from skilled therapeutic intervention in order to improve the following deficits and impairments:  Increased edema, Decreased knowledge of precautions, Impaired UE functional use, Decreased strength, Decreased range of motion, Decreased scar mobility, Postural dysfunction, Pain, Increased fascial restricitons  Visit Diagnosis: Stiffness of right shoulder, not elsewhere classified  Chronic right shoulder pain  Abnormal posture  Muscle weakness (generalized)     Problem List Patient Active Problem List   Diagnosis Date Noted  . Malignant neoplasm of upper-outer quadrant of right breast in female, estrogen receptor negative ([place]) 05/13/2013  . Anemia, unspecified 02/24/2013  . GERD (gastroesophageal reflux disease) 12/16/2012  . Asthmatic bronchitis 04/21/2011  . INTRACRANIAL ANEURYSM 08/28/2009  . Cerebrovascular disease 08/28/2009  . COLONIC POLYPS 08/26/2009  . Essential hypertension 05/12/2008  . HEMORRHOIDS 05/12/2008  . Hypothyroidism 12/02/2007  . HYPERCHOLESTEROLEMIA 12/02/2007  . Anxiety state 12/02/2007  . Transient cerebral ischemia 12/02/2007  . Osteoarthritis 12/02/2007  . FIBROMYALGIA 12/02/2007  . Osteoporosis 12/02/2007    Allyson Sabal [facility] 06/27/2017, 4:16 PM  [place], [place], [address] Phone: [phone]   Fax:  [phone]  Name: SEQUOYA HOGSETT MRN: [id number] Date of Birth: April 03, 1941  Manus Gunning, PT 06/27/17 4:16 PM  PHYSICAL THERAPY DISCHARGE SUMMARY  Visits from Start of Care: 15  Current functional  level related to goals / functional outcomes: See above   Remaining deficits: Just shy of meeting right shoulder abduction ROM goal but pt pleased with current ROM   Education / Equipment: HEP, compression sleeve Plan: Patient agrees to discharge.  Patient goals were met. Patient is being discharged due to meeting the stated rehab goals.  ?????     Allyson Sabal [place], [place] 06/27/17 4:17 PM

## 2017-07-17 ENCOUNTER — Other Ambulatory Visit: Payer: Self-pay | Admitting: Pulmonary Disease

## 2017-07-26 ENCOUNTER — Other Ambulatory Visit: Payer: Medicare Other

## 2017-07-27 ENCOUNTER — Other Ambulatory Visit: Payer: Self-pay | Admitting: Pulmonary Disease

## 2017-07-28 ENCOUNTER — Other Ambulatory Visit: Payer: Self-pay | Admitting: Pulmonary Disease

## 2017-08-02 ENCOUNTER — Telehealth: Payer: Self-pay | Admitting: Pulmonary Disease

## 2017-08-02 MED ORDER — DOXYCYCLINE HYCLATE 100 MG PO TABS: ORAL_TABLET | ORAL | 0 refills | Status: DC

## 2017-08-02 NOTE — Telephone Encounter (Signed)
Spoke with pt and advised of Dr Janee Morn recommendations.  Rx sent to pharmacy.  Pt verbalized understanding.  Nothing further needed.

## 2017-08-02 NOTE — Telephone Encounter (Signed)
Offer doxycycline 100 mg, # 8, 2 today then one daily  Mucinex-DM may help

## 2017-08-02 NOTE — Telephone Encounter (Signed)
Called and spoke with pt. she is experiencing head congestion, ear ache, postnasal drip & prod cough with yellow mucus x4d. Pt is requesting recommendations. Pt states she has left over cough syrup that she is taken with improvement.    CY please advise, as Durene Cal is unavailable. Thanks.    Current Outpatient Medications on File Prior to Visit  Medication Sig Dispense Refill  . aspirin EC 325 MG tablet Take 325 mg by mouth every morning.     . Calcium Carbonate-Vitamin D (CALTRATE 600+D PO) Take 600 mg by mouth 2 (two) times daily.    . clopidogrel (PLAVIX) 75 MG tablet TAKE 1 TABLET(75 MG) BY MOUTH DAILY 30 tablet 5  . lisinopril (PRINIVIL,ZESTRIL) 20 MG tablet TAKE 1 TABLET BY MOUTH ONCE DAILY 90 tablet 3  . metoprolol succinate (TOPROL-XL) 100 MG 24 hr tablet Take 1 tablet (100 mg total) by mouth daily. Take with or immediately following a meal. 30 tablet 6  . metroNIDAZOLE (METROCREAM) 0.75 % cream Apply 1 application topically 2 (two) times daily. For rosacea    . Multiple Vitamin (MULTIVITAMIN WITH MINERALS) TABS tablet Take 1 tablet by mouth every morning. Centrum Silver    . rosuvastatin (CRESTOR) 20 MG tablet TAKE 1 TABLET BY MOUTH DAILY 90 tablet 2  . sertraline (ZOLOFT) 100 MG tablet TAKE 1 TABLET BY MOUTH ONCE DAILY 30 tablet 11  . SYNTHROID 112 MCG tablet TAKE ONE TABLET BY MOUTH ONCE DAILY BEFORE  BREAKFAST 90 tablet 1   No current facility-administered medications on file prior to visit.     No Known Allergies

## 2017-08-15 ENCOUNTER — Ambulatory Visit
Admission: RE | Admit: 2017-08-15 | Discharge: 2017-08-15 | Disposition: A | Discharge status: Home / Self Care | Payer: Medicare Other | Source: Ambulatory Visit | Attending: Oncology | Admitting: Oncology

## 2017-08-15 DIAGNOSIS — R2231 Localized swelling, mass and lump, right upper limb: Secondary | ICD-10-CM

## 2017-08-15 DIAGNOSIS — N6001 Solitary cyst of right breast: Secondary | ICD-10-CM | POA: Diagnosis not present

## 2017-08-28 DIAGNOSIS — M25561 Pain in right knee: Secondary | ICD-10-CM | POA: Diagnosis not present

## 2017-09-01 DIAGNOSIS — M25561 Pain in right knee: Secondary | ICD-10-CM | POA: Diagnosis not present

## 2017-09-06 DIAGNOSIS — M25561 Pain in right knee: Secondary | ICD-10-CM | POA: Diagnosis not present

## 2017-09-26 ENCOUNTER — Ambulatory Visit (INDEPENDENT_AMBULATORY_CARE_PROVIDER_SITE_OTHER): Payer: Medicare Other

## 2017-09-26 ENCOUNTER — Other Ambulatory Visit: Payer: Self-pay | Admitting: Podiatry

## 2017-09-26 ENCOUNTER — Ambulatory Visit (INDEPENDENT_AMBULATORY_CARE_PROVIDER_SITE_OTHER): Payer: Medicare Other | Admitting: Podiatry

## 2017-09-26 ENCOUNTER — Encounter: Payer: Self-pay | Admitting: Podiatry

## 2017-09-26 VITALS — BP 136/71 | HR 78 | Resp 16

## 2017-09-26 DIAGNOSIS — M2012 Hallux valgus (acquired), left foot: Secondary | ICD-10-CM | POA: Diagnosis not present

## 2017-09-26 DIAGNOSIS — D689 Coagulation defect, unspecified: Secondary | ICD-10-CM

## 2017-09-26 DIAGNOSIS — M79672 Pain in left foot: Secondary | ICD-10-CM

## 2017-09-26 DIAGNOSIS — M79671 Pain in right foot: Secondary | ICD-10-CM

## 2017-09-26 DIAGNOSIS — L84 Corns and callosities: Secondary | ICD-10-CM | POA: Diagnosis not present

## 2017-09-26 DIAGNOSIS — M779 Enthesopathy, unspecified: Secondary | ICD-10-CM

## 2017-09-26 DIAGNOSIS — M2011 Hallux valgus (acquired), right foot: Secondary | ICD-10-CM | POA: Diagnosis not present

## 2017-09-26 DIAGNOSIS — M21619 Bunion of unspecified foot: Secondary | ICD-10-CM

## 2017-09-26 MED ORDER — TRIAMCINOLONE ACETONIDE 10 MG/ML IJ SUSP
10.0000 mg | Freq: Once | INTRAMUSCULAR | Status: AC
  Administered 2017-09-26: 10 mg

## 2017-09-26 NOTE — Progress Notes (Signed)
   Subjective:    Patient ID: Debra Mcintyre, female    DOB: 1941-06-01, 77 y.o.   MRN: [id number]  HPI    Review of Systems  All other systems reviewed and are negative.      Objective:   Physical Exam        Assessment & Plan:

## 2017-09-27 NOTE — Progress Notes (Signed)
Subjective:   Patient ID: Debra Mcintyre, female   DOB: 77 y.o.   MRN: [id number]   HPI Patient presents with significant structural malalignment bilateral with quite a bit of forefoot pain right which is occurred in the last few months and makes it hard to walk on her forefoot.  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  Patient does not smoke and would like to be more active   Review of Systems  All other systems reviewed and are negative.       Objective:  Physical Exam  Constitutional: She appears well-developed and well-nourished.  Cardiovascular: Intact distal pulses.  Pulmonary/Chest: Effort normal.  Musculoskeletal: Normal range of motion.  Neurological: She is alert.  Skin: Skin is warm.  Nursing note and vitals reviewed.   Neurovascular status intact muscle strength adequate range of motion within normal limits with patient noted to have structural forefoot deformity bilaterally with keratotic tissue formation plantar first metatarsal bilateral with inflammation centered mostly around the second and third metatarsal phalangeal joint of the right foot with both joints being involved.  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  Sharp debridement of both first metatarsals accomplished with no iatrogenic bleeding and today I did a forefoot block right with 60 mg like Marcaine mixture and under sterile conditions after explaining risk I  aspirated the second and third MPJs and injected quarter cc of dexamethasone Kenalog into each joint.  Applied padding to reduce stress discussed possible orthotics and reappoint to recheck again in the next several weeks  X-rays indicate that there is structural bunion deformity bilateral hammertoe but no indications of advanced arthritis stress fracture

## 2017-10-06 ENCOUNTER — Other Ambulatory Visit: Payer: Self-pay | Admitting: Pulmonary Disease

## 2017-10-20 ENCOUNTER — Other Ambulatory Visit: Payer: Self-pay | Admitting: Pulmonary Disease

## 2017-10-27 ENCOUNTER — Other Ambulatory Visit: Payer: Self-pay | Admitting: Pulmonary Disease

## 2017-10-31 ENCOUNTER — Ambulatory Visit: Payer: PRIVATE HEALTH INSURANCE | Admitting: Podiatry

## 2017-12-12 ENCOUNTER — Encounter: Payer: Self-pay | Admitting: *Deleted

## 2017-12-12 ENCOUNTER — Encounter: Payer: Self-pay | Admitting: Podiatry

## 2017-12-12 ENCOUNTER — Ambulatory Visit (INDEPENDENT_AMBULATORY_CARE_PROVIDER_SITE_OTHER): Payer: Medicare Other | Admitting: Podiatry

## 2017-12-12 DIAGNOSIS — M779 Enthesopathy, unspecified: Secondary | ICD-10-CM | POA: Diagnosis not present

## 2017-12-12 MED ORDER — TRIAMCINOLONE ACETONIDE 10 MG/ML IJ SUSP
10.0000 mg | Freq: Once | INTRAMUSCULAR | Status: AC
  Administered 2017-12-12: 10 mg

## 2017-12-12 NOTE — Progress Notes (Signed)
Subjective:   Patient ID: Debra Mcintyre, female   DOB: 77 y.o.   MRN: [id number]   HPI Patient presents stating these joints are starting to hurt again due to go to [place] in 1 week   ROS      Objective:  Physical Exam  Neurovascular status intact with inflammation fluid buildup of the second and third metatarsal phalangeal joint right foot     Assessment:  Inflammatory capsulitis of 2 separate joints right foot     Plan:  Reviewed condition and did proximal nerve block of the right foot.  I then went ahead and aspirated the second and third MPJs getting out clear fluid and injected with 1/4 cc dexamethasone 1/4 cc Kenalog into each joint surface.  Reappoint for [place] to recheck

## 2017-12-13 DIAGNOSIS — M25561 Pain in right knee: Secondary | ICD-10-CM | POA: Diagnosis not present

## 2017-12-18 ENCOUNTER — Ambulatory Visit: Payer: Medicare Other | Admitting: Pulmonary Disease

## 2018-01-10 ENCOUNTER — Ambulatory Visit (INDEPENDENT_AMBULATORY_CARE_PROVIDER_SITE_OTHER): Payer: Medicare Other

## 2018-01-10 ENCOUNTER — Encounter: Payer: Self-pay | Admitting: Pulmonary Disease

## 2018-01-10 ENCOUNTER — Ambulatory Visit (INDEPENDENT_AMBULATORY_CARE_PROVIDER_SITE_OTHER): Payer: Medicare Other | Admitting: Pulmonary Disease

## 2018-01-10 VITALS — BP 132/62 | HR 89 | Temp 97.7°F | Ht 68.0 in | Wt 158.4 lb

## 2018-01-10 DIAGNOSIS — Z0000 Encounter for general adult medical examination without abnormal findings: Secondary | ICD-10-CM

## 2018-01-10 DIAGNOSIS — C50411 Malignant neoplasm of upper-outer quadrant of right female breast: Secondary | ICD-10-CM | POA: Diagnosis not present

## 2018-01-10 DIAGNOSIS — Z171 Estrogen receptor negative status [ER-]: Secondary | ICD-10-CM | POA: Diagnosis not present

## 2018-01-10 DIAGNOSIS — K219 Gastro-esophageal reflux disease without esophagitis: Secondary | ICD-10-CM | POA: Diagnosis not present

## 2018-01-10 DIAGNOSIS — E039 Hypothyroidism, unspecified: Secondary | ICD-10-CM

## 2018-01-10 DIAGNOSIS — M150 Primary generalized (osteo)arthritis: Secondary | ICD-10-CM | POA: Diagnosis not present

## 2018-01-10 DIAGNOSIS — E7800 Pure hypercholesterolemia, unspecified: Secondary | ICD-10-CM

## 2018-01-10 DIAGNOSIS — I10 Essential (primary) hypertension: Secondary | ICD-10-CM

## 2018-01-10 DIAGNOSIS — J4520 Mild intermittent asthma, uncomplicated: Secondary | ICD-10-CM

## 2018-01-10 DIAGNOSIS — D126 Benign neoplasm of colon, unspecified: Secondary | ICD-10-CM

## 2018-01-10 DIAGNOSIS — I679 Cerebrovascular disease, unspecified: Secondary | ICD-10-CM

## 2018-01-10 DIAGNOSIS — Z23 Encounter for immunization: Secondary | ICD-10-CM | POA: Diagnosis not present

## 2018-01-10 DIAGNOSIS — F411 Generalized anxiety disorder: Secondary | ICD-10-CM

## 2018-01-10 DIAGNOSIS — M159 Polyosteoarthritis, unspecified: Secondary | ICD-10-CM

## 2018-01-10 NOTE — Patient Instructions (Signed)
Today we updated your med list in our EPIC system...    Continue your current medications the same...  Today we gave you the last of the Pneumonia shots-- PREVNAR-13 (one & done)...  Keep up the good work w/ diet, exercise, Lumosity, etc...  Call for any questions...  Let's plan a follow up visit in 39mo, sooner if needed for problems.Marland KitchenMarland Kitchen

## 2018-01-10 NOTE — Progress Notes (Signed)
Subjective:    Patient ID: Debra Mcintyre, female    DOB: 21-Sep-1940, 77 y.o.   MRN: [id number]  HPI 77 y/o WF here for a follow up visit... she has multiple medical problems as noted below...  ~  SEE PREV EPIC NOTES FOR OLDER DATA>>   ~  March 2. 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 She developed back & hip pain while getting her XRT- eval by DrCaffrey w/ ?HNP, no sign of mets, given epid steroid shot which really helped...  She has some paresthesias & tried Neurontin, stopped this on her own...     HBP> on MetopER100, Lisin20; BP= 112/64 & she denies CP, palpit, SOB, edema...    Cerebrovasc Dis> on VHQ469 & Plavix75; s/p aneurysm & TIA; stable w/o cerebral ischemic symptoms- she has not had f/u DrDeveshwar-IR- she notes she was told no need for f/u angiography (we might consider MRI later)...    Chol> on Cres20; FLP showed TChol 164, TG 116, HDL 49, LDL 92; rec to continue same med & diet efforts...    Hypothyroid> on Synthroid 132mcg/d; TSH= 0.35 & she is clinically euthyroid, continue same...    Colon polyps> last colon was 11/10 w/ one adenomatous polyp removed, f/u planned 26yrs.    Breast cancer> followed by DrMagrinat, DrWakefield, DrMurray w/ prev surg, chemoRx and XRT- notes reviewed...    DJD, FM, Osteop> on Calcium, MVI, OTC analgesics; BMD followed by her GYN...    Anxiety> on EffexorXR75 and she reports stable on this med...  We reviewed prob list, meds, xrays and labs> see below for updates >>   CXR 3/15 showed borderline heart size, left sided port, new RUL airsp dis (could be fibrosis from XRT), scoliosis & lower Tspine compression...  CT Chest 3/15 showed subpleural airspace  consolidation and ground-glass in the anterior aspect of the right upper and right middle lobes indicative of radiation therapy; post op changes in right breast & axila; incidental 63mm nodule in RMLunchanged from 2013, no adenopathy, atherosclerotic calcif noted...   LABS 3/15:  FLP- at goals on Cres20;  Chems- wnl;  CBC- wnl w/ Hg=12.6;  TSH=0.35 on Synthroid125...   ~  April 16, 2014:  75mo ROV & Melvin reports a good interval- she informs me that she has decided against reconstruction & is very comfortable w/ her decision;  She notes some fall allergies & is using Zyrtek prn;  She gets exercise by walking the dog & will join a [employer] program soon;  She has had f/u visits w/ DrSanger, DrWakefield, & DrMagrinat in the interval- stable & doing satis, f/u left mammogram 6/15 (she is s/p right mastectomy) and it was neg... We reviewed the following medical problems during today's office visit >>     AbnCXR> CXR 3/15 showed new RUL airspace dis & CTChest 3/15 revealed GG opacities prob related to radiation therapy; incidental 25mm nodule in RML w/o ch from 2013, atherosclerotic calcif, no adenopathy; due for f/u film today=> improved...    HBP> on MetopER100, Lisin20; BP= 122/68 & she denies CP, palpit, SOB, edema; she wants to decr the MeptopER to 50mg /d- ok & watch BP at home...    Cerebrovasc Dis> on ASA325 & Plavix75;  s/p aneurysm & TIA; stable w/o cerebral ischemic symptoms- she has not had f/u DrDeveshwar,IR- she notes she was told no need for f/u angiography (we might consider MRI later)...    Chol> on Cres20; FLP 3/15 showed TChol 164, TG 116, HDL 49, LDL 92; rec to continue same med & diet efforts...    Hypothyroid> on Synthroid 171mcg/d; labs 3/15 showed TSH= 0.35 & she is clinically euthyroid, continue same...    Colon polyps> last colon was 11/10 w/ one adenomatous polyp removed, f/u planned 80yrs.    Breast cancer> followed by DrMagrinat, DrWakefield, DrMurray w/ prev surg, chemoRx and XRT- notes  reviewed; she has decided against reconstruction...    DJD, FM, Osteop> on Calcium, MVI, OTC analgesics; BMD followed by her GYN...    Anxiety> on EffexorXR75 and she reports stable on this med...  We reviewed prob list, meds, xrays and labs> see below for updates >>   CXR 9/15 showed no infiltrate, opacity, etc; scoliosis & stable compression deformity; port has been removed...  ~  October 13, 2014:  12mo ROV & Neely reports feeling well, no new commplaints or concerns; she notes that her chest is ok- no CP, palpit, SOB, and she denies cough/ sput/ hemoptysis; her weight is 156# w/ BMI=24... We reviewed the following medical problems during today's office visit >>     AbnCXR> CXR 3/15 w/ new RUL airspace dis & CTChest 3/15 revealed GG opacities prob related to radiation therapy; incidental 5mm nodule in RML w/o ch from 2013, atherosclerotic calcif, no adenopathy; f/u film 9/15=> improved.    HBP> on MetopER100, Lisin20; BP= 112/64 & she denies CP, palpit, SOB, edema...    Cerebrovasc Dis> on OAC166 & Plavix75; s/p aneurysm & TIA; stable w/o cerebral ischemic symptoms- she has not had f/u DrDeveshwar,IR- she notes she was told no need for f/u angiography (we might consider MRI later)...    Chol> on Cres20; FLP 3/16 showed TChol 174, TG 200, HDL 45, LDL 89; rec to continue same med & diet efforts...    Hypothyroid> on Synthroid 198mcg/d; labs 3/16 showed TSH= 0.11 & she is clinically euthyroid, rec to step down her Syntroid dose to 13mcg/d...    Colon polyps> last colon was 11/10 w/ one adenomatous polyp removed, f/u planned by DrDBrodie 3/16- pending...    Breast cancer> followed by DrMagrinat Q38mo, DrWakefield, DrMurray w/ prev surg, chemoRx (triple neg tumor) and XRT- notes reviewed; she has decided against reconstruction...    DJD, FM, Osteop> on Calcium, MVI, OTC analgesics; BMD followed by her GYN...    Anxiety> on EffexorXR75 and she reports stable on this med...  We reviewed prob list, meds,  xrays and labs> see below for updates >>   LABS 3/16:  FLP- ok on Cres20 x TG=200;  Chems- wnl;  CBC- wnl;  TSH=0.11 on Synthroid125... PLAN>> Calvary is stable & in good spirits; she is asked to follow a low chol/ low fat diet & work on wt reduction; her TSH is oversuppressed on Synthroid125 & we will step her down to 131mcg/d...   ~  April 20, 2015:  70mo ROV & Claritza reports doing well, feeling well, no new complaints or concerns;  She continues to f/u w/ DrMagrinat & DrWakefield yearly (alternating Q48mo)- she saw DrMagrinat 8/16 & stable, no evid dis recurrence... We reviewed the following medical problems during today's office visit >>     AbnCXR> CXR 3/15 w/ new RUL airspace dis & CTChest 3/15 revealed GG opacities prob related  to radiation therapy; incidental 33mm nodule in RML w/o ch from 2013, atherosclerotic calcif, no adenopathy; f/u film 9/15=> improved, and serial f/u film 9/16=> clear, wnl...    HBP> on MetopER100, Lisin20; BP= 124/76 & she denies CP, palpit, SOB, edema; we discussed increasing her physical activity...    Cerebrovasc Dis> on ASA325 & Plavix75; Hx cerebrovasc dis, tiny aneurysm & TIA; stable w/o cerebral ischemic symptoms- 9/16 she mentioned that she'd like to have f/u MRI/MRA to recheck vasc status and the sm MCA aneurysm...    Chol> on Cres20; FLP 3/16 showed TChol 174, TG 200, HDL 45, LDL 89; rec to continue same med & diet efforts...    Hypothyroid> on Synthroid 164mcg/d; labs 3/16 showed TSH= 0.11 & she is clinically euthyroid, rec to step down her Syntroid dose to 173mcg/d...    Colon polyps> she had f/u colonoscopy 6/16 by DrDBrodie- 3mm sessile polyp removed from the desc colon=> tubular adenoma & they rec f/u colon in 5 yrs...    Breast cancer> Hx right breast cancer Dx 2/14 w/ ChemoRx, Surg, XRT; she continues f/u w/ DrMagrinat & DrWakefield Q21mo; last seen 8/16 & stable- no known recurrence, f/u mammogram left breast 8/16 remains neg...     DJD, FM, Osteop> on  Calcium, MVI, OTC analgesics; BMD done 3/16 at [facility] w/ lowest Tscore-1.5 in distal left radius; encouraged to continue supplements and wt bearing exercise...    Anxiety> on EffexorXR75 and she reports stable on this med...  EXAM showed Afeb, VSS, O2sat=96%;  HEENT- neg, mallampati2;  Chest- clear w/o w/r/r;  Heart- RR w/o m/r/g;  Abd- soft, neg;  Ext- w/o c/c/e;  Neuro- intact w/o focal neuro deficits...   CXR 9/16 showed norm heart size, clear lungs, +scoliosis, lower thoracic compression deformity is stable, NAD.Marland KitchenMarland Kitchen  EKG 04/2015 showed NSR, rate65, IRBBB, otherw norm EKG, NAD...  LABS 04/2015:  Chems- wnl;  CBC- wnl  MRI / MRA Brain => pending IMP/PLAN>>  Leonore continues to do well- no new complaints or concerns, but she is worried about the fact that she hasn't has a f/u MRI/MRA in 105yrs; she continues stable w/o cerebral ischemic symptoms on ASA /Plavix; we will order the scan for follow up;  She needs to increase her exercise program & will consider [employer] at the [employer] at the cancer center classes...  ADDENDUM>>  MRI done 05/04/15- for some reason MRA was not done;  No infarct or hemorrhage, no intracranial mass or abn enhancement; mod small vessel dis & mild global atrophy; she needs MRA to assess the 1.5-39mm right MCA bifuraction aneurysm-- I discussed w/ pt & she decided to forego the MRA at this time since she remains asymptomatic...  ~  October 18, 2015:  48mo ROV & Kaleea reports that she is stable, good 59mo interval, no new complaints or concerns;  She had left cat surg 2 wks ago by DrShapiro & planning right cat surg soon;  She notes that her neuropathy symptoms in feet>hands and she upped her Gabapentin to 300mg  Bid (helping);  Epic reveals that she had a scare 06/2015 w/ lump felt in right mastectomy scar & 106mm hypoechoic nodule on ultrasound; DrWakefield rec Bx & this was done 07/12/15 by radiology=> Path neg inflammed fibroadipose tissue c/w scar, no malignancy... We reviewed the  following medical problems during today's office visit >>     S/p cat surg> as noted, DrShapiro    AbnCXR> CXR 3/15 w/ new RUL airspace dis & CTChest 3/15  revealed GG opacities prob related to radiation therapy; incidental 48mm nodule in RML w/o ch from 2013, atherosclerotic calcif, no adenopathy; f/u film 9/15=> improved, and serial f/u film 9/16=> clear, wnl...    HBP> on MetopER100, Lisin20; BP= 124/70 & she denies CP, palpit, SOB, edema; we discussed increasing her physical activity...    Cerebrovasc Dis> on ASA325 & Plavix75; Hx cerebrovasc dis, tiny aneurysm & TIA; stable w/o cerebral ischemic symptoms- 9/16 she mentioned that she'd like to have f/u MRI/MRA to recheck vasc status and the sm MCA aneurysm=> mod small vessel dis, no evid intracranial mets, right MCA bifurcation aneurysm ~37mm size needs MRA or CTA to properly assess but she decided to hold off on additional scans for now...    Chol> on Cres20; FLP 3/17 showed TChol 195, TG 184, HDL 49, LDL 110; rec to continue same med & diet efforts...    Hypothyroid> on Synthroid 114mcg/d; labs 3/17 showed TSH= 2.23 & she is clinically euthyroid- continue same    Colon polyps> she had f/u colonoscopy 6/16 by DrDBrodie- 18mm sessile polyp removed from the desc colon=> tubular adenoma & they rec f/u colon in 5 yrs...    Breast cancer> Hx right breast cancer Dx 2/14 w/ ChemoRx, Surg, XRT; she continues f/u w/ DrMagrinat & DrWakefield Q65mo; last seen 8/16 & stable- no known recurrence, she had ultrasound bx 11/16 of right chest wall- NEG    DJD, FM, Osteop> on Calcium, MVI, OTC analgesics; BMD done 3/16 at [facility] w/ lowest Tscore-1.5 in distal left radius; encouraged to continue supplements and wt bearing exercise...    Neuro w/ mod small vessel dis & global atrophy on MRI + Neuropathy on Gabapentin 300mg  Bid    Anxiety> on EffexorXR75 and she reports stable on this med...  EXAM showed Afeb, VSS, O2sat=96%;  HEENT- neg, mallampati2;  Chest- clear w/o w/r/r;   Heart- RR w/o m/r/g;  Abd- soft, neg;  Ext- w/o c/c/e;  Neuro- intact w/o focal neuro deficits...   LABS 10/22/15>  FLP- ok on Cres20 w/ LDL=110;  Chems- wnl x BS=106;  CBC- wnl;  TSH=2.23;  VitD=47... IMP/PLAN>>  Adisen has been stable & the right breast scare from 11/16 turned out to be inflammed adipose tissue/ scar;  Labs are OK & she continues to work on diet/ exercise, continue same meds; we discussed ROV recheck in 39mo...   ~  September 20, 2016:  58mo ROV & add-on appt requested due to cough, yellow sput, low grade fever along w/ fatigue & body aches;  URI=> ILI w/ assoc nausea, no vomiting, some abd discomfort & sl loose stool;  We discussed Rx w/ Depo80, Pred taper, Levaquin, Mucinex, & Hycodan, plus align, fluids, etc...     HxAbnCXR> CXR 3/15 w/ new RUL airspace dis & CTChest 3/15 revealed GG opacities prob related to radiation therapy; incidental 104mm nodule in RML w/o ch from 2013, atherosclerotic calcif, no adenopathy; f/u film 9/15=> improved, and serial f/u film 9/16=> clear, wnl...    EXAM showed Afeb, VSS, O2sat=97% on RA;  HEENT- neg, mallampati2;  Chest- clear x few scat rhonchi w/o consolid/ wheezing;  Heart- RR w/o m/r/g;  Abd- soft, neg;  Ext- w/o c/c/e;  Neuro- intact w/o focal neuro deficits...   CXR 09/20/16 (independently reviewed by me in the PACS system) showed norm heart size, Ao atherosclerosis, clear lungs w/ mild left base atx, scoliosis of spine & osteopenia; Note- mild chr compression of lower Tspine vertebra... IMP/PLAN>>  Sherese has an upper  resp infection/ ILI/ AB exac=> discussed treatment w/ Depo80, Pred taper (see AVS), Levaquin500/d, Mucinex600Qid w/ fluids, & Hycodan prn cough...  ~  June 18, 2017:  27mo ROV & add-on appt requested for 3d hx dyspnea>  She was referred by DrMagrinat for phys therapy (it's helping her right arm) & is participating in the [place] program at the cancer center, going to the gym 2d/wk etc and she plans to participate in a silver  sneakers program going forward;  She notes that her breathing is doing well now- no cough, phlegm, hemoptysis; no CP, palpit, dizzy, edema; she denies abd pain, n/v, c/d, blood seen, etc... We gave her the 2018 FLU vaccine today...    She saw DrBowers and had lower lid blepharoplasty;  Her Ophthalmologist is Dr.Shapiro & he's done bilat cat surg in past...    She saw DrMagrinat- Oncology 03/28/17> hx right breast cancer & pos right axillary node 2014 treated w/ chemoRx, Surg, XRT; no evid of disease recurrence, she was c/o some arm discomfort & referred for phys therapy. We reviewed the following medical problems during today's office visit >>     AbnCXR> CXR 3/15 w/ new RUL airspace dis & CTChest 3/15 revealed GG opacities prob related to radiation therapy; incidental 41mm nodule in RML w/o ch from 2013, atherosclerotic calcif, no adenopathy; f/u film 9/15=> improved, and serial f/u film thru 2/18=> clear, wnl...    HBP> on MetopER100, Lisin20; BP= 136/62 & she denies CP, palpit, SOB, edema; we discussed increasing her physical activity...    Cerebrovasc Dis> on ASA325 & Plavix75; Hx cerebrovasc dis, tiny aneurysm & TIA; stable w/o cerebral ischemic symptoms- 9/16 she mentioned that she'd like to have f/u MRI/MRA to recheck vasc status and the sm MCA aneurysm=> mod small vessel dis, no evid intracranial mets, right MCA bifurcation aneurysm ~46mm size needs MRA or CTA to properly assess but she decided to hold off on additional scans for now...    Chol> on Cres20; FLP 11/18 shows TChol 159, TG 176, HDL 44, LDL 81; rec to continue same med & diet efforts...    Hypothyroid> on Synthroid 18mcg/d; labs 3/17 showed TSH= 2.23 & she is clinically euthyroid; f/u Labs 11/18 showed TSH=0.39...    Colon polyps> she had f/u colonoscopy 6/16 by DrDBrodie- 33mm sessile polyp removed from the desc colon=> tubular adenoma & they rec f/u colon in 5 yrs...    Breast cancer> Hx right breast cancer & pos axillary nodes Dx 2/14  w/ ChemoRx, Surg, XRT; she continues f/u w/ DrMagrinat & DrWakefield; ultrasound bx 11/16 of right chest wall- NEG; last seen 8/18 as above & no known recur.    DJD, FM, Osteop> on Calcium, MVI, OTC analgesics; BMD done 3/16 at [facility] w/ lowest Tscore-1.5 in distal left radius; encouraged to continue supplements and wt bearing exercise; f/u BMD 9/18 w/ lowest Tscore -0.4 in wrist...    Neuro w/ mod small vessel dis & global atrophy on MRI + Neuropathy on ASA, Plavix, Gabapentin 300mg  Bid>  We discussed the need for physical activity & mental activity like Lumosity...    Shaunie has noted some memory issues- she did not bring her med bottles w/ her today- we checked w/ [facility] on [address] & she last filled Zoloft100 in Jan2018 & last filled EffexorXR75 in Nov2017...    Anxiety> she did not bring med bottles to the [place] indicates that she has not been filling the Zoloft100 or the EffexorXR75... EXAM showed Afeb, VSS,  O2sat=96% on RA;  HEENT- neg, mallampati2;  Chest- clear x few scat rhonchi w/o consolid/ wheezing;  Heart- RR w/o m/r/g;  Abd- soft, neg;  Ext- w/o c/c/e;  Neuro- intact w/o focal neuro deficits...   BMD 05/01/17>  Lowest Tscore -0.4 in forearm radius which is wnl-- rec to continue Calcium, MVI, & ~1000u VitD daily supplements...  LABS 06/18/17>  FLP- Chol parameters are wnl on Cres20 but TG=176 & rec better low fat diet/ get wt down;  TSH=0.39 on Synthroid112;  VitD=41 & rec to take OTC VitD supplement ~1000u daily... IMP/PLAN>>  Jasalyn will continue w/ the [employer] exercise program & consider silver sneakers etc;  We discucced mental activity as well- books, puzzles, consider Lumosity on the computer;  Give 2018 FLU vaccine...   ~  Jan 10, 2018:  56mo ROV & Mikenzi reports a good interval, notes some mild head & chest congestion, min cough w/ clear mucus, had recent vaca in [place] (grand daughter in grad-school studying polit sci);  sh'e beed feeling well, denies SOB/  CP/ palpit/ edema/ etc... She continues to follow up w/ DrMagrinat once per yr & doing satis;  She is working on her memory w/ Lumosity, puzzles, etc & notes she did well w/ travel plans etc...     Hx abn CXR after XRT for her breast cancer> last CXR 09/2016 ok w/ ?some COPD/ atx left base (blunt left angle)- NAD, aortic atherosclerosis    Hx HBP, cerebrovasc dis> on MetopER100, Lisin20, ASA325/ Plavix75 and stable...    Hx HL, hypothy> on Cres20, Synthroid112 & labs look good...    Hx breast cancer as noted- followed by DrMagrinat...    Hx DJD, FM, osteop> on calc, mvi, OTC analgesics prn; f/u BMD 9/18 w/ lowest Tscore -0.4 in wrist    Neuro- small vessel dis, atrophy, neuropathy> on the ASA/Plavix + Gabapentin 300Bid... EXAM showed Afeb, VSS, O2sat=96% on RA;  HEENT- neg, mallampati2;  Chest- clear x few scat rhonchi w/o consolid/ wheezing;  Heart- RR w/o m/r/g;  Abd- soft, neg;  Ext- w/o c/c/e;  Neuro- intact w/o focal neuro deficits...  IMP/PLAN>>  Tytianna reports a good 43mo interval- no new complaints or concerns; we reviewed her prob list, rec continue same meds; given Prevnar-13 vaccination today & rec rov recheck in 27mo...           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 Marland Kitchen.. ~  Followed by DrMagrinat, DrWakefield, DrMurray w/ prev surg, chemoRx and XRT => SUMMARY:    1) status post right breast and right axillary lymph node biopsies February 2014 of a clinical T2, pN1, stage IIB invasive ductal carcinoma, grade 3, triple negative, with an MIB-1 of 15%.    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    4)status post right mastectomy and sentinel lymph node sampling 04/23/2013 for a residual pT1b pN1 invasive ductal carcinoma, grade 3, again triple negative [2/10 sample lymph nodes were involved by tumor, both with micrometastatic  deposits -- NOTE: she opted against reconstruction.    5) adjuvant radiation completed 08/13/2013    6) the patient underwent genetics counseling but declined testing. ~  2015: She reports that 9 y/o sister was Dx w/ breast cancer this yr...  ABNORMAL CXR >>  ~  3/15:  CXR showed new RUL airspace dis & CTChest 3/15 revealed GG opacities prob related to radiation therapy; incidental 28mm nodule in RML w/o ch from 2013, atherosclerotic calcif, no adenopathy... ~  9/15:  CXR 9/15 showed no infiltrate, opacity, etc; scoliosis & stable compression deformity; port has been removed... ~  9/16:  CXR 9/16 showed norm heart size, clear lungs, +scoliosis, lower thoracic compression deformity is stable, NAD...  HYPERTENSION (ICD-401.9) > ~  on TOPROL XL 100mg /d,  LISINOPRIL/ Hct 20/12.5 daily; BP well controlled on these meds; denies HA, fatigue, visual changes, CP, palipit, dizziness, syncope, dyspnea, edema, etc; she does water aerobics & walks for exercise...  ~  CXR 8/12 showed normal heart size, clear lungs, scoliosis, NAD... ~  10/12:  Presents w/ chest discomfort> EKG showed NSR, rate 69, rsr' in V1-2, otherw wnl; referred to Cards=> she requests DrKelly. ~  11/13:  BP= 128/82 & she remains asymptomatic... ~  7/14: on MetopER100 & LisinHCT20-12.5; BP= 128/72 but has been volatile at home & in Oncology office prompting DrMagrinat to change to Lisin10 & stop the Hct in light of her 103 wt loss...  ~  3/15: on MetopER100, Lisin20; BP= 112/64 & she denies CP, palpit, SOB, edema ~  9/15: on MetopER100, Lisin20; BP= 122/68 & she denies  CP, palpit, SOB, edema; she wants to decr the MeptopER to 50mg /d => but she never did... ~  3/16: on MetopER100, Lisin20; BP= 112/64 & she denies CP, palpit, SOB, edema ~  9/16: on MetopER100, Lisin20; BP= 124/76 & she remains asymptomatic; we discussed increasing her physical activity...  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 she's been stable since that time w/ out pt f/u by IR, [name]> she indicates he said no further studies needed. ~  CDopplers 5/04 showed mild plaque, no signif ICA stenoses... ~  MR studies 10/09 showed sm vessel dis; poss restenosis left MCA w/ angiogram rec- but wasn't done; mod stenosis at origin of left vertebral art w/ tortuosity... ~  Carotid arteriogram 12/10 showed stable mild residual left middle cerebral art stenosis at site of prev angioplasty, and stable 1.5 to 42mm saccular right middle cerebral art aneurysm... ~  10/12:  she remains asymptomatic- w/o cerebral ischemic symptoms... ~  10/13:  she continues stable on ASA/ Plavix w/o cerebral ischemic symptoms... ~  3/15 & 9/15: on ASA325 & Plavix75; s/p aneurysm & TIA; stable w/o cerebral ischemic symptoms- she has not had f/u DrDeveshwar-IR- she notes she was told no need for f/u angiography (we might consider MRI later) ~  3/16: on [place]; she denies cerebral ischemic symptoms & is stable... ~  9/16: stable on ASA/ Plavix and we decided to repeat her MRI/ MRA => pending  HYPERCHOLESTEROLEMIA (ICD-272.0) >>  ~  FLP 3/08 shows Tchol 180, TG 206, HDL 49, LDL 105... rec same meds, better diet, get wt down. ~  FLP 10/09 on Lip80 showed TChol 150, TG 178, HDL 41, LDL 74 ~  FLP 1/11 on Lip80+Zetia10 showed TChol  162, TG 226, HDL 47, LDL 95... she wants to stop Zetia, get on diet & get wt  down. ~  FLP 1/12 on Lip80 showed TChol 173, TG 164, HDL 45, LDL 95 ~  FLP 10/13 on Lip80 intermittently showed TChol 189, TG 231, HDL 45, LDL 112... She was c/o paresthesias that she was convinced was due to the Lip80 & she requested change to CRESTOR 20mg /d... ~  [place] 4/14 on Cres20 showed TChol 167, TG 141, HDL 46, LDL 93 ~  FLP 3/15 on Cres20 showed TChol 164, TG 116, HDL 49, LDL 92 ~  FLP 3/16 on Cres20 showed TChol 174, TG 200, HDL 45, LDL 89; we reviewed diet, exercise, wt reduction...  HYPOTHYROIDISM (ICD-244.9) - on SYNTHROID 155mcg/d... ~  labs 3/08 showed TSH = 0.23 ~  labs 10/09 showed TSH= 0.64 ~  labs 1/11 showed TSH= 0.18... she wants to keep same dose to aide wt reduction. ~  labs 1/12 showed TSH= 0.20... Ditto ~  Labs 10/13 on Levo125 showed TSH= 1.20 ~  Labs 3/15 on Synthroid125 showed TSH= 0.35 ~  Labs 3/16 on Synthroid125 showed TSH= 0.11; we decided to step down her Synthroid dose to 121mcg/d...  COLONIC POLYPS (ICD-211.3), & Hx of HEMORRHOIDS (ICD-455.6) ~  colonoscopy 10/00 by DrDBrodie showed hems only... f/u planned 52yrs. ~  f/u colonoscopy 11/10 showed 2 polyps- one adenomatous w/ f/u planned 55yrs. ~  she had f/u colonoscopy 6/16 by DrDBrodie- 49mm sessile polyp removed from the desc colon=> tubular adenoma & they rec f/u colon in 5 yrs...  DEGENERATIVE JOINT DISEASE (ICD-715.90) - uses Tylenol & OTC meds as needed... in 2004 seen by [facility] w/ soft tissue hemangioma found in right shoulder area... second opinion from DrWWard @  [facility] confirmed this- no surg necessary... ~  1/11:  notes some pain in hands & wrist... ~  1/12:  Stable w/o acute complaints or problem areas... ~  2014: she developed LBP & hip pain while receiving XRT for her breast cancer; eval by DrMagrinat & DrCaffrey w/ epid steroid shot that really helped... ~  She has a scoliosis and compression deformity in lower TSpine...  Hx of FIBROMYALGIA (ICD-729.1)  OSTEOPOROSIS (ICD-733.00) - she  indicates that this is followed & managed by GYN, DrCousins> ?when last BMD was done "she keeps up w/ this" > on Calcium, & Vitamins... ~  labs 1/11 showed Vit D level = 54... ~  Pt again reiterates that GYN follows her BMDs & is up to date... ~  BMD done 3/16 at [facility] w/ lowest Tscore-1.5 in distal left radius; encouraged to continue supplements and wt bearing exercise...  TIA (ICD-435.9) - as above, she remains on ASA325 & PLAVIX75... last saw DrReynolds in 2006... 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 arteriogram by Dr. Fleeta Emmer confirmed a web-like plaque in the left M1 segment of the left MCA with signif stenosis...  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  ANXIETY (ICD-300.00) - on EFFEXOR 75mg /d for hot flashes, she says.... she wishes to continue the med "it keeps me on an even keel".  Health Maintenance - GYN= DrCousins & she will call for f/u... Mammograms at [facility]... BMDs at [facility] & results sent to DrCousins... ~  Immunizations: she  refuses Flu vaccine... given PNEUMOVAX- 1/11, and Tdap- 1/11...   Past Surgical History:  Procedure Laterality Date  . BREAST BIOPSY Right 10/23/2012  . BREAST BIOPSY Right 10/08/2012  . COLONOSCOPY    . COLONOSCOPY W/ POLYPECTOMY    . ESOPHAGOGASTRODUODENOSCOPY    . Left middle cerebral artery angioplasty  2004   by [name] had 2 follow up occurances  . MASTECTOMY Right   . MASTECTOMY W/ SENTINEL NODE BIOPSY Right 04/23/2013   Procedure: RIGHT TOTAL MASTECTOMY WITH SENTINEL LYMPH NODE BIOPSY;  Surgeon: [name], MD;  Location: [place];  Service: General;  Laterality: Right;  . PORTACATH PLACEMENT N/A 11/05/2012   Procedure: INSERTION PORT-A-CATH;  Surgeon: [name],  MD;  Location: [place];  Service: General;  Laterality: N/A;  . RADIOLOGY WITH ANESTHESIA N/A 05/04/2015   Procedure: MRI BRAIN WITH AND WITHOUT;  Surgeon: Medication Radiologist, MD;  Location: [place];  Service: Radiology;  Laterality: N/A;    Outpatient Encounter Medications as of 01/10/2018  Medication Sig  . aspirin EC 325 MG tablet Take 325 mg by mouth every morning.   . Calcium Carbonate (CALTRATE 600 PO) Take by mouth 2 (two) times daily.  . cetirizine (ZYRTEC) 10 MG tablet Take by mouth.  . clopidogrel (PLAVIX) 75 MG tablet TAKE 1 TABLET(75 MG) BY MOUTH DAILY  . levothyroxine (SYNTHROID, LEVOTHROID) 112 MCG tablet TAKE 1 TABLET(112 MCG) BY MOUTH DAILY BEFORE BREAKFAST  . metoprolol succinate (TOPROL-XL) 100 MG 24 hr tablet TAKE 1 TABLET BY MOUTH EVERY DAY WITH OR IMMEDIATELY FOLLOWING A MEAL  . metroNIDAZOLE (METROCREAM) 0.75 % cream Apply 1 application topically 2 (two) times daily. For rosacea  . Multiple Vitamin (MULTIVITAMIN WITH MINERALS) TABS tablet Take 1 tablet by mouth every morning. Centrum Silver  . rosuvastatin (CRESTOR) 20 MG tablet TAKE 1 TABLET BY MOUTH DAILY  . sertraline (ZOLOFT) 100 MG tablet TAKE 1 TABLET BY MOUTH ONCE DAILY  . SYNTHROID 112 MCG tablet TAKE ONE TABLET BY MOUTH ONCE DAILY BEFORE  BREAKFAST  . [DISCONTINUED] doxycycline (VIBRA-TABS) 100 MG tablet Take 2 tablets today, then 1 tablet daily until gone  . [DISCONTINUED] gabapentin (NEURONTIN) 300 MG capsule Take by mouth.  . [DISCONTINUED] lisinopril (PRINIVIL,ZESTRIL) 20 MG tablet TAKE 1 TABLET BY MOUTH ONCE DAILY  . [DISCONTINUED] loratadine (CLARITIN) 10 MG tablet Take by mouth.  . [DISCONTINUED] tetrahydrozoline 0.05 % ophthalmic solution Apply to eye.  . [DISCONTINUED] tobramycin-dexamethasone (TOBRADEX) ophthalmic solution   . [DISCONTINUED] venlafaxine XR (EFFEXOR-XR) 75 MG 24 hr capsule TAKE ONE CAPSULE BY MOUTH ONCE DAILY  . [DISCONTINUED] Vitamins/Minerals TABS Take by mouth.    No facility-administered encounter medications on file as of 01/10/2018.     No Known Allergies   Immunization History  Administered Date(s) Administered  . Influenza Split 05/28/2012  . Influenza, High Dose Seasonal PF 07/13/2016, 06/18/2017  . Influenza,inj,Quad PF,6+ Mos 06/05/2013, 05/13/2014, 04/20/2015  . Pneumococcal Conjugate-13 01/10/2018  . Pneumococcal Polysaccharide-23 08/26/2009  . Td 08/26/2009    Current Medications, Allergies, Past Medical History, Past Surgical History, Family History, and Social History were reviewed in [employer] record.     She is an ex-smoker & quit in 1987 w/ ~20 pack-yr smoking hx...    Several family members have had cancer: Mother (nonsmoker) died at 4 w/ lung cancer- one of her sisters also had lung cancer, and a brother had colon cancer (both dx in 62s); Chassity had one brother who died from colon cancer at 10, & that  bro had a daughter who was dx w/ uterine cancer at 57;  Pt also has one sister dx w/ breast cancer in 2015... NOTE: pt declined genetic testing...   Review of Systems    Constitutional:  Denies F/C/S, appetite is good, weight is stable ~150# HEENT:  No HA, visual changes, earache, nasal symptoms, sore throat, hoarseness. Resp:  No cough, sputum, hemoptysis; +SOB/DOE w/o tightness or wheezing. Cardio:  No CP, palpit, orthopnea, edema; notes some DOE GI:  Denies N/V/D/C or blood in stool; no reflux, abd pain, distention, or gas. GU:  No dysuria, freq, urgency, hematuria, or flank pain. MS:  Denies joint pain, swelling, tenderness; no neck pain, back pain, etc. Neuro:  No tremors, seizures, dizziness, syncope; prev weakness & numbness improved, gait is ok... Skin:  No suspicious lesions or skin rash. Heme:  No bruising, bleeding, etc; axillary adenopathy is resolved Psyche: Denies confusion, sleep disturbance, hallucinations, anxiety, depression.   Objective:   Physical Exam      WD, WN, 77 y/o WF  in NAD... GENERAL:  Alert & oriented; pleasant & cooperative... HEENT:  [place]/AT, EOM-wnl, PERRLA, EACs-clear, TMs-wnl, NOSE-clear, THROAT-clear & wnl. NECK:  Supple w/ fairROM; no JVD; normal carotid impulses w/o bruits; no thyromegaly or nodules palpated; no lymphadenopathy. CHEST:  Clear to P&A w/o wheezing, rales, or rhonchi detected... HEART:  Regular Rhythm; without murmurs/ rubs/ or gallops. ABDOMEN:  Soft & nontender; normal bowel sounds; no organomegaly or masses palpated. EXT: without deformities, mild arthritic changes; no varicose veins/ +venous insuffic/ no edema. NEURO:  CN's intact;  no focal neuro deficits... DERM:  No lesions noted; no rash etc...  RADIOLOGY DATA:  Reviewed in the EPIC EMR & discussed w/ the patient...  LABORATORY DATA:  Reviewed in the EPIC EMR & discussed w/ the patient...   Assessment & Plan:    01/10/18>   Christen reports a good 80mo interval- no new complaints or concerns; we reviewed her prob list, rec continue same meds; given Prevnar-13 vaccination today & rec rov recheck in 4mo...   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 She continues f/u w/ DrMagrinat & DrWakefield yearly...   ASTHMATIC BRONCHITIS>  No recent URIs or resp exac & she is once again not on regular meds, denies breathing problems, etc... Abn CXR>  Faint GG opacity in RUL has resolved (likely related to XRT); she is at baseline, stable... 09/20/16>   Yarelli has an upper resp infection/ ILI/ AB exac=> discussed treatment w/ Depo80, Pred taper (see AVS), Levaquin500/d, Mucinex600Qid w/ fluids, & Hycodan prn cough   HBP>  Controlled on BBlocker, & ACE, she continues to watch BP at home...  Cerebrovasc Dis>  She had TIA 2004 w/ eval revealing left MCA stenosis; DrDeveshwar did PTA w/ good result; incidental 1-13mm aneurysm noted in the right MCA; she has remained  asymptomatic since 2004 on the ASA/ Plavix. We will f/u MRI / MRA 9/16 => pending  CHOL>  Controlled on Cres20 (she was intol to Lip80), needs better low fat diet...  HPYOTHYROID>  On Synthroid 160mcg/d w/ TSH = 0/11 so we will step down her dose to 171mcg/d...  Other medical problems as noted... 10/18/15>   Nargis has been stable & the right breast scare from 11/16 turned out to be inflammed adipose tissue/ scar;  Labs are OK & she continues to work on diet/ exercise, continue same meds; we discussed ROV recheck  in 46mo.   Patient's Medications  New Prescriptions   No medications on file  Previous Medications   ASPIRIN EC 325 MG TABLET    Take 325 mg by mouth every morning.    CALCIUM CARBONATE (CALTRATE 600 PO)    Take by mouth 2 (two) times daily.   CETIRIZINE (ZYRTEC) 10 MG TABLET    Take by mouth.   CLOPIDOGREL (PLAVIX) 75 MG TABLET    TAKE 1 TABLET(75 MG) BY MOUTH DAILY   LEVOTHYROXINE (SYNTHROID, LEVOTHROID) 112 MCG TABLET    TAKE 1 TABLET(112 MCG) BY MOUTH DAILY BEFORE BREAKFAST   METOPROLOL SUCCINATE (TOPROL-XL) 100 MG 24 HR TABLET    TAKE 1 TABLET BY MOUTH EVERY DAY WITH OR IMMEDIATELY FOLLOWING A MEAL   METRONIDAZOLE (METROCREAM) 0.75 % CREAM    Apply 1 application topically 2 (two) times daily. For rosacea   MULTIPLE VITAMIN (MULTIVITAMIN WITH MINERALS) TABS TABLET    Take 1 tablet by mouth every morning. Centrum Silver   ROSUVASTATIN (CRESTOR) 20 MG TABLET    TAKE 1 TABLET BY MOUTH DAILY   SERTRALINE (ZOLOFT) 100 MG TABLET    TAKE 1 TABLET BY MOUTH ONCE DAILY   SYNTHROID 112 MCG TABLET    TAKE ONE TABLET BY MOUTH ONCE DAILY BEFORE  BREAKFAST  Modified Medications   No medications on file  Discontinued Medications   DOXYCYCLINE (VIBRA-TABS) 100 MG TABLET    Take 2 tablets today, then 1 tablet daily until gone   GABAPENTIN (NEURONTIN) 300 MG CAPSULE    Take by mouth.   LISINOPRIL (PRINIVIL,ZESTRIL) 20 MG TABLET    TAKE 1 TABLET BY MOUTH ONCE DAILY   LORATADINE (CLARITIN) 10 MG  TABLET    Take by mouth.   TETRAHYDROZOLINE 0.05 % OPHTHALMIC SOLUTION    Apply to eye.   TOBRAMYCIN-DEXAMETHASONE (TOBRADEX) OPHTHALMIC SOLUTION       VENLAFAXINE XR (EFFEXOR-XR) 75 MG 24 HR CAPSULE    TAKE ONE CAPSULE BY MOUTH ONCE DAILY   VITAMINS/MINERALS TABS    Take by mouth.

## 2018-02-06 ENCOUNTER — Other Ambulatory Visit: Payer: Self-pay | Admitting: Pulmonary Disease

## 2018-03-08 ENCOUNTER — Other Ambulatory Visit: Payer: Self-pay | Admitting: Pulmonary Disease

## 2018-03-28 ENCOUNTER — Other Ambulatory Visit: Payer: Self-pay | Admitting: Pulmonary Disease

## 2018-04-01 DIAGNOSIS — Z961 Presence of intraocular lens: Secondary | ICD-10-CM | POA: Diagnosis not present

## 2018-04-04 ENCOUNTER — Ambulatory Visit (INDEPENDENT_AMBULATORY_CARE_PROVIDER_SITE_OTHER): Payer: Medicare Other | Admitting: Podiatry

## 2018-04-04 ENCOUNTER — Encounter: Payer: Self-pay | Admitting: Podiatry

## 2018-04-04 DIAGNOSIS — L84 Corns and callosities: Secondary | ICD-10-CM | POA: Diagnosis not present

## 2018-04-04 DIAGNOSIS — M779 Enthesopathy, unspecified: Secondary | ICD-10-CM | POA: Diagnosis not present

## 2018-04-04 NOTE — Progress Notes (Signed)
Subjective:   Patient ID: Debra Mcintyre, female   DOB: 77 y.o.   MRN: [id number]   HPI Patient states right foot is been doing better with still mild to moderate soreness but improved with pain underneath the left big toe which is really become bothersome over the last month.  Patient does not remember specific injury   ROS      Objective:  Physical Exam  Neurovascular status intact with patient found to have large keratotic lesion sub-hallux left that is quite painful when palpated and is noted on the right foot to have mild inflammation of the second third MPJs with quite good improvement from previous     Assessment:  Keratotic lesion sub-hallux left secondary to pressure and mild to moderate inflammatory capsulitis right second and third MPJ     Plan:  H&P condition reviewed and for the left using sharp sterile instrumentation I debrided the lesion applied padding and instructed to probably need to be done in future and for the right I recommended rigid bottom shoes and continued offloading.  Patient will be seen back as symptoms indicate

## 2018-04-07 ENCOUNTER — Other Ambulatory Visit: Payer: Self-pay | Admitting: Pulmonary Disease

## 2018-06-04 ENCOUNTER — Inpatient Hospital Stay: Payer: Medicare Other | Attending: Oncology

## 2018-06-04 DIAGNOSIS — Z9011 Acquired absence of right breast and nipple: Secondary | ICD-10-CM | POA: Insufficient documentation

## 2018-06-04 DIAGNOSIS — Z9221 Personal history of antineoplastic chemotherapy: Secondary | ICD-10-CM | POA: Insufficient documentation

## 2018-06-04 DIAGNOSIS — Z853 Personal history of malignant neoplasm of breast: Secondary | ICD-10-CM | POA: Diagnosis not present

## 2018-06-04 DIAGNOSIS — Z801 Family history of malignant neoplasm of trachea, bronchus and lung: Secondary | ICD-10-CM | POA: Insufficient documentation

## 2018-06-04 DIAGNOSIS — Z800 Family history of malignant neoplasm of digestive organs: Secondary | ICD-10-CM | POA: Insufficient documentation

## 2018-06-04 DIAGNOSIS — Z8041 Family history of malignant neoplasm of ovary: Secondary | ICD-10-CM | POA: Diagnosis not present

## 2018-06-04 DIAGNOSIS — Z803 Family history of malignant neoplasm of breast: Secondary | ICD-10-CM | POA: Diagnosis not present

## 2018-06-04 DIAGNOSIS — C50411 Malignant neoplasm of upper-outer quadrant of right female breast: Secondary | ICD-10-CM

## 2018-06-04 LAB — CBC WITH DIFFERENTIAL/PLATELET
ABS IMMATURE GRANULOCYTES: 0.01 10*3/uL (ref 0.00–0.07)
BASOS PCT: 1 %
Basophils Absolute: 0.1 10*3/uL (ref 0.0–0.1)
Eosinophils Absolute: 0.2 10*3/uL (ref 0.0–0.5)
Eosinophils Relative: 3 %
HEMATOCRIT: 39.9 % (ref 36.0–46.0)
Hemoglobin: 13 g/dL (ref 12.0–15.0)
IMMATURE GRANULOCYTES: 0 %
LYMPHS ABS: 1.9 10*3/uL (ref 0.7–4.0)
Lymphocytes Relative: 28 %
MCH: 28.6 pg (ref 26.0–34.0)
MCHC: 32.6 g/dL (ref 30.0–36.0)
MCV: 87.9 fL (ref 80.0–100.0)
MONO ABS: 0.5 10*3/uL (ref 0.1–1.0)
MONOS PCT: 7 %
NEUTROS ABS: 4 10*3/uL (ref 1.7–7.7)
NEUTROS PCT: 61 %
PLATELETS: 256 10*3/uL (ref 150–400)
RBC: 4.54 MIL/uL (ref 3.87–5.11)
RDW: 13.2 % (ref 11.5–15.5)
WBC: 6.6 10*3/uL (ref 4.0–10.5)
nRBC: 0 % (ref 0.0–0.2)

## 2018-06-04 LAB — COMPREHENSIVE METABOLIC PANEL
ALK PHOS: 50 U/L (ref 38–126)
ALT: 25 U/L (ref 0–44)
AST: 27 U/L (ref 15–41)
Albumin: 4 g/dL (ref 3.5–5.0)
Anion gap: 10 (ref 5–15)
BILIRUBIN TOTAL: 1 mg/dL (ref 0.3–1.2)
BUN: 12 mg/dL (ref 8–23)
CALCIUM: 9.4 mg/dL (ref 8.9–10.3)
CO2: 26 mmol/L (ref 22–32)
CREATININE: 0.77 mg/dL (ref 0.44–1.00)
Chloride: 104 mmol/L (ref 98–111)
Glucose, Bld: 110 mg/dL — ABNORMAL HIGH (ref 70–99)
Potassium: 4.5 mmol/L (ref 3.5–5.1)
Sodium: 140 mmol/L (ref 135–145)
Total Protein: 7.2 g/dL (ref 6.5–8.1)

## 2018-06-11 ENCOUNTER — Other Ambulatory Visit: Payer: Self-pay | Admitting: Oncology

## 2018-06-11 ENCOUNTER — Inpatient Hospital Stay (HOSPITAL_BASED_OUTPATIENT_CLINIC_OR_DEPARTMENT_OTHER): Payer: Medicare Other | Admitting: Oncology

## 2018-06-11 VITALS — BP 119/67 | HR 83 | Temp 98.1°F | Resp 18 | Ht 68.0 in | Wt 162.2 lb

## 2018-06-11 DIAGNOSIS — Z800 Family history of malignant neoplasm of digestive organs: Secondary | ICD-10-CM | POA: Diagnosis not present

## 2018-06-11 DIAGNOSIS — Z803 Family history of malignant neoplasm of breast: Secondary | ICD-10-CM | POA: Diagnosis not present

## 2018-06-11 DIAGNOSIS — M818 Other osteoporosis without current pathological fracture: Secondary | ICD-10-CM

## 2018-06-11 DIAGNOSIS — Z1231 Encounter for screening mammogram for malignant neoplasm of breast: Secondary | ICD-10-CM

## 2018-06-11 DIAGNOSIS — Z171 Estrogen receptor negative status [ER-]: Principal | ICD-10-CM

## 2018-06-11 DIAGNOSIS — Z801 Family history of malignant neoplasm of trachea, bronchus and lung: Secondary | ICD-10-CM

## 2018-06-11 DIAGNOSIS — Z9221 Personal history of antineoplastic chemotherapy: Secondary | ICD-10-CM

## 2018-06-11 DIAGNOSIS — Z8041 Family history of malignant neoplasm of ovary: Secondary | ICD-10-CM

## 2018-06-11 DIAGNOSIS — C50411 Malignant neoplasm of upper-outer quadrant of right female breast: Secondary | ICD-10-CM

## 2018-06-11 DIAGNOSIS — Z853 Personal history of malignant neoplasm of breast: Secondary | ICD-10-CM

## 2018-06-11 DIAGNOSIS — Z9011 Acquired absence of right breast and nipple: Secondary | ICD-10-CM | POA: Diagnosis not present

## 2018-06-11 NOTE — Progress Notes (Signed)
ID: Debra Mcintyre   DOB: Sep 13, 1940  MR#: [id number]  CBJ#:[id number]  Patient Care Team: [name], MD as PCP - General (Pulmonary Disease) Magrinat, Virgie Dad, MD as Consulting Physician (Oncology) [name], MD as Consulting Physician (Obstetrics and Gynecology) [name], MD as Consulting Physician (General Surgery)   HISTORY OF PRESENT ILLNESS: From the original intake note:  Debra Mcintyre had screening mammography at [facility] 04/10/2012 raising the question of some axillary lymph nodes on the right. Additional views of the right axilla and right axillary ultrasound performed 04/04/2012 showed 3 lymph nodes that appeared larger than prior. The largest measured 1.5 cm. 2 of them had cortical thickening. Close followup was suggested, and repeat right axillary ultrasound 06/25/2012 showed no significant change in the lymph nodes in question. Followup ultrasound in 3 months was recommended, but in the interim the patient saw her primary physician, [name] and ADL, and he set her up for a chest CT on 07/08/2012, which showed a mild asymmetric density in the upper mid right breast. There was a prominent right axillary lymph node measuring 1.9 cm, with a few smaller adjacent nodes asymmetric with compared to the left side. Again, short interval followup was recommended, and on 10/07/2012 the patient had digital right mammography and ultrasonography, now at the breast Center. Dr. [name] noted pleomorphic calcifications spanning an area of 6.3 cm without associated mass. Physical exam was unremarkable. Ultrasound showed an area of adenopathy in the right axilla measuring 1.6 cm, with a second lymph node with a thickened cortex measuring 1.2 cm. No suspicious mass was seen by ultrasound in the right breast.  Biopsy of the larger axillary lymph node on 10/07/2012 showed ([id number]) an invasive ductal carcinoma, triple negative, with an MIB-1 of 66%. Biopsy of the right breast the next day,  [id number]) showed invasive ductal carcinoma, grade 3. Breast MRI 10/14/2012 showed a total area of irregular enhancement in the right breast measuring up to 12 cm. MRI guided biopsy of an area in the upper inner quadrant of the right breast on 10/23/2012 showed ([id number]) ductal carcinoma in situ, with foci worrisome for invasion.  The patient's subsequent history is as detailed below  INTERVAL HISTORY: Debra Mcintyre returns today for follow-up of her estrogen receptor negative breast cancer accompanied by her husband. The interval history is generally unremarkable.   Since her last visit, she underwent right axilla ultrasound on 08/15/2017 which showed classic and stable appearing sebaceous cyst or epidermal inclusion cysts in the right axilla and slight interval decrease in size of the second probable sebaceous cyst or epidermal inclusion cyst.  She also underwent mammography on 06/04/2017 showing breast density category C and no mammographic evidence of malignancy.  She underwent bone density testing on 05/01/2017 showing a T-score of -0.4. (Lumbar spine not utilized due to degenerative changes.)   REVIEW OF SYSTEMS: Debra Mcintyre reports feeling great overall. She reports she visited [place] to see her daughter graduate from graduate school this year. She also visited the beach last week. She states she has not been exercising, but notes she hopes a program similar to [employer] will become covered by Medicare soon. The patient denies unusual headaches, visual changes, nausea, vomiting, stiff neck, dizziness, or gait imbalance. There has been no cough, phlegm production, or pleurisy, no chest pain or pressure, and no change in bowel or bladder habits. The patient denies fever, rash, bleeding, unexplained fatigue or unexplained weight loss. A detailed review of systems was otherwise entirely negative.  PAST MEDICAL HISTORY: Past Medical History:  Diagnosis Date  . Breast cancer ([place]) 09/2012    right breast/right axillary lymph node t2,pn1, stage 11b, invasive ductal carcinma, grade 3, triple negative, with an mib-1 of 15%  . Cerebral aneurysm, nonruptured   . Cerebrovascular disease, unspecified   . Colonic polyp   . Dizziness    pt. states that she experiences this occassionally 05/03/15  . Headache    occassionally  . Heart murmur   . History of chemotherapy   . Hx of radiation therapy 05/27/13-08/13/13   right chest wall/right supraclavicular/axillary region 5220 cGy 29 sessions, right mastectomy/chest wall boost cGy 5 sessions  . Hypertension    Does not see a cardiologist  . Hypothyroidism   . Neuropathy   . Pure hypercholesterolemia   . Rosacea   . Seasonal allergies   . TIA (transient ischemic attack)   . Wears glasses     PAST SURGICAL HISTORY: Past Surgical History:  Procedure Laterality Date  . BREAST BIOPSY Right 10/23/2012  . BREAST BIOPSY Right 10/08/2012  . COLONOSCOPY    . COLONOSCOPY W/ POLYPECTOMY    . ESOPHAGOGASTRODUODENOSCOPY    . Left middle cerebral artery angioplasty  2004   by [name] had 2 follow up occurances  . MASTECTOMY Right   . MASTECTOMY W/ SENTINEL NODE BIOPSY Right 04/23/2013   Procedure: RIGHT TOTAL MASTECTOMY WITH SENTINEL LYMPH NODE BIOPSY;  Surgeon: [name], MD;  Location: [facility];  Service: General;  Laterality: Right;  . PORTACATH PLACEMENT N/A 11/05/2012   Procedure: INSERTION PORT-A-CATH;  Surgeon: [name], MD;  Location: [place];  Service: General;  Laterality: N/A;  . RADIOLOGY WITH ANESTHESIA N/A 05/04/2015   Procedure: MRI BRAIN WITH AND WITHOUT;  Surgeon: Medication Radiologist, MD;  Location: [place];  Service: Radiology;  Laterality: N/A;    FAMILY HISTORY Family History  Problem Relation Age of Onset  . Lung cancer Mother 57  . Heart disease Father   . Colon cancer Brother 24  . Alcohol abuse Brother   . Colon cancer Maternal Uncle        dx in his 33s  . Colon cancer  Maternal Aunt   . Ovarian cancer Other 48  . Lung cancer Maternal Aunt   . Breast cancer Sister    the patient's father died at the age of 40. The patient's mother died at the age of 59 from lung cancer. She was not a smoker. The cancer was diagnosed when she was 75 years old. The patient's mother had 2 sisters and one brother. One of those sisters had lung cancer and a brother had colon cancer, both diagnosed in their 68s. The patient herself has one brother who died from colon cancer at the age of 61. That brother has a daughter who was diagnosed with uterine cancer at the age of 25. All this raises the question of possible Lynch syndrome and I have referred the patient for genetics counseling. The patient does have one sister who was diagnosed with breast cancer in 2015.  GYNECOLOGIC HISTORY: Menarche age 17, menopause around 84. The patient is GX P0. She took birth control pills for many years with no complications.  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 Her daughter has graduated from graduate school and will intern with a [employer] representative. Her stepchildren are [place] who lives in "[place]" and is financial vice  president of [employer], and [place] who lives in [place] and works as a Tree surgeon. The patient has 5 grandchildren. She is currently not a church attender    ADVANCED DIRECTIVES: Not in place; this was discussed 10/29/2012 and the patient was advised to complete a healthcare power of attorney document  HEALTH MAINTENANCE: Social History   Tobacco Use  . Smoking status: Former Smoker    Packs/day: 1.00    Years: 20.00    Pack years: 20.00    Types: Cigarettes    Last attempt to quit: 04/02/1986    Years since quitting: 32.2  . Smokeless tobacco: Never Used  Substance Use Topics  . Alcohol use: No  . Drug use: No     Colonoscopy: 02/10/2015  PAP: 2012  Bone density:  05/01/2017, T-score of -0.4.  Lipid panel: 06/22/2017  No Known Allergies  Current Outpatient Medications  Medication Sig Dispense Refill  . aspirin EC 325 MG tablet Take 325 mg by mouth every morning.     . Calcium Carbonate (CALTRATE 600 PO) Take by mouth 2 (two) times daily.    . cetirizine (ZYRTEC) 10 MG tablet Take by mouth.    . clopidogrel (PLAVIX) 75 MG tablet TAKE 1 TABLET(75 MG) BY MOUTH DAILY 30 tablet 5  . levothyroxine (SYNTHROID, LEVOTHROID) 112 MCG tablet TAKE 1 TABLET(112 MCG) BY MOUTH DAILY BEFORE BREAKFAST 90 tablet 3  . metoprolol succinate (TOPROL-XL) 100 MG 24 hr tablet TAKE 1 TABLET BY MOUTH EVERY DAY WITH OR IMMEDIATELY FOLLOWING A MEAL 30 tablet 0  . metroNIDAZOLE (METROCREAM) 0.75 % cream Apply 1 application topically 2 (two) times daily. For rosacea    . Multiple Vitamin (MULTIVITAMIN WITH MINERALS) TABS tablet Take 1 tablet by mouth every morning. Centrum Silver    . rosuvastatin (CRESTOR) 20 MG tablet TAKE 1 TABLET BY MOUTH DAILY 90 tablet 0  . sertraline (ZOLOFT) 100 MG tablet TAKE 1 TABLET BY MOUTH ONCE DAILY 30 tablet 11  . SYNTHROID 112 MCG tablet TAKE ONE TABLET BY MOUTH ONCE DAILY BEFORE  BREAKFAST 90 tablet 1   No current facility-administered medications for this visit.     OBJECTIVE: Middle-aged white woman in no acute distress  Vitals:   06/11/18 1302  BP: 119/67  Pulse: 83  Resp: 18  Temp: 98.1 F (36.7 C)  SpO2: 97%     Body mass index is 24.66 kg/m.    ECOG FS: 1 Filed Weights   06/11/18 1302  Weight: 162 lb 3.2 oz (73.6 kg)   Sclerae unicteric, pupils round and equal Oropharynx clear and moist No cervical or supraclavicular adenopathy Lungs no rales or rhonchi Heart regular rate and rhythm Abd soft, nontender, positive bowel sounds MSK no focal spinal tenderness, no upper extremity lymphedema Neuro: nonfocal, well oriented, appropriate affect Breasts: The right breast is status post mastectomy.  There is no evidence of chest wall  recurrence.  The left breast is unremarkable.  Both axillae are benign.  LAB RESULTS: Lab Results  Component Value Date   WBC 6.6 06/04/2018   NEUTROABS 4.0 06/04/2018   HGB 13.0 06/04/2018   HCT 39.9 06/04/2018   MCV 87.9 06/04/2018   PLT 256 06/04/2018      Chemistry      Component Value Date/Time   NA 140 06/04/2018 1150   NA 141 03/21/2017 1105   K 4.5 06/04/2018 1150   K 4.3 03/21/2017 1105   CL 104 06/04/2018 1150   CL 102 01/28/2013 0909  CO2 26 06/04/2018 1150   CO2 25 03/21/2017 1105   BUN 12 06/04/2018 1150   BUN 12.5 03/21/2017 1105   CREATININE 0.77 06/04/2018 1150   CREATININE 0.7 03/21/2017 1105      Component Value Date/Time   CALCIUM 9.4 06/04/2018 1150   CALCIUM 9.8 03/21/2017 1105   ALKPHOS 50 06/04/2018 1150   ALKPHOS 48 03/21/2017 1105   AST 27 06/04/2018 1150   AST 21 03/21/2017 1105   ALT 25 06/04/2018 1150   ALT 21 03/21/2017 1105   BILITOT 1.0 06/04/2018 1150   BILITOT 0.86 03/21/2017 1105       STUDIES: Mammography and ultrasonography results reviewed with the patient  ASSESSMENT: 77 y.o. [place] woman   (1)  status post right breast upper outer quadrant and right axillary lymph node biopsies February 2014 of a clinical T2, pN1, stage IIB  invasive ductal carcinoma, grade 3, triple negative, with an MIB-1 of 15%  (2)  a third biopsy from a different quadrant 10/23/2012 showed ductal carcinoma in situ  (3)  treated in the neoadjuvant setting with 4 dose dense cycles of doxorubicin/ cyclophosphamide 12/23/2012, followed by 7 weekly doses of paclitaxel and completed 03/03/2013.  (4) status post right mastectomy and sentinel lymph node sampling 04/23/2013 for a residual ypT1b ypN1 invasive ductal carcinoma, grade 3, again triple negative [2/10 sample lymph nodes were involved by tumor, both with micrometastatic deposits]  (a) the patient opted against reconstruction  (5) adjuvant radiation completed 08/13/2013  (6) the patient  underwent genetics counseling but declined testing   PLAN:  Debra Mcintyre is now a little over 5 years out from definitive surgery for her breast cancer with no evidence of disease recurrence.  This is particularly favorable as triple negative tumors if they are to recur tend to recur early  Accordingly at this point I feel comfortable releasing her to her primary care physician.  All she will need in terms of breast cancer follow-up is yearly mammography and a yearly physician breast and chest wall exam  I will be glad to see Debra Mcintyre again at any point in the future if and when the need arises but as of now are making no further routine appointments for her here.  Chauncey Cruel, MD    06/11/2018  This document serves as a record of services personally performed by Debra Del, MD. It was created on his behalf by Debra Mcintyre, a trained medical scribe. The creation of this record is based on the scribe's personal observations and the provider's statements to them. This document has been checked and approved by the attending provider.  I, Debra Del MD, have reviewed the above documentation for accuracy and completeness, and I agree with the above.

## 2018-06-12 ENCOUNTER — Telehealth: Payer: Self-pay | Admitting: Oncology

## 2018-06-12 NOTE — Telephone Encounter (Signed)
Per 10/29 no los

## 2018-07-04 ENCOUNTER — Other Ambulatory Visit: Payer: Self-pay | Admitting: Pulmonary Disease

## 2018-07-15 ENCOUNTER — Ambulatory Visit (INDEPENDENT_AMBULATORY_CARE_PROVIDER_SITE_OTHER): Payer: Medicare Other | Admitting: Pulmonary Disease

## 2018-07-15 ENCOUNTER — Encounter: Payer: Self-pay | Admitting: Pulmonary Disease

## 2018-07-15 ENCOUNTER — Ambulatory Visit (INDEPENDENT_AMBULATORY_CARE_PROVIDER_SITE_OTHER): Payer: Medicare Other

## 2018-07-15 VITALS — BP 120/68 | HR 68 | Temp 97.7°F | Ht 68.0 in | Wt 166.0 lb

## 2018-07-15 DIAGNOSIS — Z23 Encounter for immunization: Secondary | ICD-10-CM | POA: Diagnosis not present

## 2018-07-15 DIAGNOSIS — J4520 Mild intermittent asthma, uncomplicated: Secondary | ICD-10-CM | POA: Diagnosis not present

## 2018-07-15 DIAGNOSIS — I679 Cerebrovascular disease, unspecified: Secondary | ICD-10-CM | POA: Diagnosis not present

## 2018-07-15 DIAGNOSIS — K219 Gastro-esophageal reflux disease without esophagitis: Secondary | ICD-10-CM | POA: Diagnosis not present

## 2018-07-15 DIAGNOSIS — I10 Essential (primary) hypertension: Secondary | ICD-10-CM | POA: Diagnosis not present

## 2018-07-15 DIAGNOSIS — Z171 Estrogen receptor negative status [ER-]: Secondary | ICD-10-CM

## 2018-07-15 DIAGNOSIS — M150 Primary generalized (osteo)arthritis: Secondary | ICD-10-CM

## 2018-07-15 DIAGNOSIS — E039 Hypothyroidism, unspecified: Secondary | ICD-10-CM

## 2018-07-15 DIAGNOSIS — C50411 Malignant neoplasm of upper-outer quadrant of right female breast: Secondary | ICD-10-CM | POA: Diagnosis not present

## 2018-07-15 DIAGNOSIS — E7800 Pure hypercholesterolemia, unspecified: Secondary | ICD-10-CM | POA: Diagnosis not present

## 2018-07-15 DIAGNOSIS — D126 Benign neoplasm of colon, unspecified: Secondary | ICD-10-CM

## 2018-07-15 DIAGNOSIS — Z0000 Encounter for general adult medical examination without abnormal findings: Secondary | ICD-10-CM

## 2018-07-15 DIAGNOSIS — F411 Generalized anxiety disorder: Secondary | ICD-10-CM | POA: Diagnosis not present

## 2018-07-15 DIAGNOSIS — M159 Polyosteoarthritis, unspecified: Secondary | ICD-10-CM

## 2018-07-15 NOTE — Patient Instructions (Signed)
Today we updated your med list in our EPIC system...    Continue your current medications the same...  We discussed calling your orthopedist, DrCaffrey, regarding your left elbow discomfort...  Please go to the [facility] office building for a follow up [place] work one morning this week...    We will contact you w/ the results when available...   We discussed my upcoming retirement & suggest that you call the [facility] primary care office on [facility] to get established with one of their physicians in 2020...  Jenille,  It has been my great honor to have been one of your doctors over these many years...    Wishing you and your family a very merry Christmas & a happy & healthy new year!!!

## 2018-07-15 NOTE — Progress Notes (Signed)
Subjective:    Patient ID: ARIANNE KLINGE, female    DOB: 1941/01/01, 77 y.o.   MRN: [id number]  HPI 77 y/o WF here for a follow up visit... she has multiple medical problems as noted below...  ~  SEE PREV EPIC NOTES FOR OLDER DATA>>   ~  March 2. 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 She developed back & hip pain while getting her XRT- eval by DrCaffrey w/ ?HNP, no sign of mets, given epid steroid shot which really helped...  She has some paresthesias & tried Neurontin, stopped this on her own...     HBP> on MetopER100, Lisin20; BP= 112/64 & she denies CP, palpit, SOB, edema...    Cerebrovasc Dis> on [id number] & Plavix75; s/p aneurysm & TIA; stable w/o cerebral ischemic symptoms- she has not had f/u DrDeveshwar-IR- she notes she was told no need for f/u angiography (we might consider MRI later)...    Chol> on Cres20; FLP showed TChol 164, TG 116, HDL 49, LDL 92; rec to continue same med & diet efforts...    Hypothyroid> on Synthroid 131mcg/d; TSH= 0.35 & she is clinically euthyroid, continue same...    Colon polyps> last colon was 11/10 w/ one adenomatous polyp removed, f/u planned 16yrs.    Breast cancer> followed by DrMagrinat, DrWakefield, DrMurray w/ prev surg, chemoRx and XRT- notes reviewed...    DJD, FM, Osteop> on Calcium, MVI, OTC analgesics; BMD followed by her GYN...    Anxiety> on EffexorXR75 and she reports stable on this med...  We reviewed prob list, meds, xrays and labs> see below for updates >>   CXR 3/15 showed borderline heart size, left sided port, new RUL airsp dis (could be fibrosis from XRT), scoliosis & lower Tspine compression...  CT Chest 3/15 showed subpleural airspace  consolidation and ground-glass in the anterior aspect of the right upper and right middle lobes indicative of radiation therapy; post op changes in right breast & axila; incidental 101mm nodule in RMLunchanged from 2013, no adenopathy, atherosclerotic calcif noted...   LABS 3/15:  FLP- at goals on Cres20;  Chems- wnl;  CBC- wnl w/ Hg=12.6;  TSH=0.35 on Synthroid125...   ~  April 16, 2014:  38mo ROV & Sharmon reports a good interval- she informs me that she has decided against reconstruction & is very comfortable w/ her decision;  She notes some fall allergies & is using Zyrtek prn;  She gets exercise by walking the dog & will join a [employer] program soon;  She has had f/u visits w/ DrSanger, DrWakefield, & DrMagrinat in the interval- stable & doing satis, f/u left mammogram 6/15 (she is s/p right mastectomy) and it was neg... We reviewed the following medical problems during today's office visit >>     AbnCXR> CXR 3/15 showed new RUL airspace dis & CTChest 3/15 revealed GG opacities prob related to radiation therapy; incidental 37mm nodule in RML w/o ch from 2013, atherosclerotic calcif, no adenopathy; due for f/u film today=> improved...    HBP> on MetopER100, Lisin20; BP= 122/68 & she denies CP, palpit, SOB, edema; she wants to decr the MeptopER to 50mg /d- ok & watch BP at home...    Cerebrovasc Dis> on ASA325 & Plavix75;  s/p aneurysm & TIA; stable w/o cerebral ischemic symptoms- she has not had f/u DrDeveshwar,IR- she notes she was told no need for f/u angiography (we might consider MRI later)...    Chol> on Cres20; FLP 3/15 showed TChol 164, TG 116, HDL 49, LDL 92; rec to continue same med & diet efforts...    Hypothyroid> on Synthroid 138mcg/d; labs 3/15 showed TSH= 0.35 & she is clinically euthyroid, continue same...    Colon polyps> last colon was 11/10 w/ one adenomatous polyp removed, f/u planned 73yrs.    Breast cancer> followed by DrMagrinat, DrWakefield, DrMurray w/ prev surg, chemoRx and XRT- notes  reviewed; she has decided against reconstruction...    DJD, FM, Osteop> on Calcium, MVI, OTC analgesics; BMD followed by her GYN...    Anxiety> on EffexorXR75 and she reports stable on this med...  We reviewed prob list, meds, xrays and labs> see below for updates >>   CXR 9/15 showed no infiltrate, opacity, etc; scoliosis & stable compression deformity; port has been removed...  ~  October 13, 2014:  64mo ROV & Rafaela reports feeling well, no new commplaints or concerns; she notes that her chest is ok- no CP, palpit, SOB, and she denies cough/ sput/ hemoptysis; her weight is 156# w/ BMI=24... We reviewed the following medical problems during today's office visit >>     AbnCXR> CXR 3/15 w/ new RUL airspace dis & CTChest 3/15 revealed GG opacities prob related to radiation therapy; incidental 42mm nodule in RML w/o ch from 2013, atherosclerotic calcif, no adenopathy; f/u film 9/15=> improved.    HBP> on MetopER100, Lisin20; BP= 112/64 & she denies CP, palpit, SOB, edema...    Cerebrovasc Dis> on DDU202 & Plavix75; s/p aneurysm & TIA; stable w/o cerebral ischemic symptoms- she has not had f/u DrDeveshwar,IR- she notes she was told no need for f/u angiography (we might consider MRI later)...    Chol> on Cres20; FLP 3/16 showed TChol 174, TG 200, HDL 45, LDL 89; rec to continue same med & diet efforts...    Hypothyroid> on Synthroid 159mcg/d; labs 3/16 showed TSH= 0.11 & she is clinically euthyroid, rec to step down her Syntroid dose to 145mcg/d...    Colon polyps> last colon was 11/10 w/ one adenomatous polyp removed, f/u planned by DrDBrodie 3/16- pending...    Breast cancer> followed by DrMagrinat Q35mo, DrWakefield, DrMurray w/ prev surg, chemoRx (triple neg tumor) and XRT- notes reviewed; she has decided against reconstruction...    DJD, FM, Osteop> on Calcium, MVI, OTC analgesics; BMD followed by her GYN...    Anxiety> on EffexorXR75 and she reports stable on this med...  We reviewed prob list, meds,  xrays and labs> see below for updates >>   LABS 3/16:  FLP- ok on Cres20 x TG=200;  Chems- wnl;  CBC- wnl;  TSH=0.11 on Synthroid125... PLAN>> Kalkidan is stable & in good spirits; she is asked to follow a low chol/ low fat diet & work on wt reduction; her TSH is oversuppressed on Synthroid125 & we will step her down to 144mcg/d...   ~  April 20, 2015:  18mo ROV & Carmina reports doing well, feeling well, no new complaints or concerns;  She continues to f/u w/ DrMagrinat & DrWakefield yearly (alternating Q42mo)- she saw DrMagrinat 8/16 & stable, no evid dis recurrence... We reviewed the following medical problems during today's office visit >>     AbnCXR> CXR 3/15 w/ new RUL airspace dis & CTChest 3/15 revealed GG opacities prob related  to radiation therapy; incidental 81mm nodule in RML w/o ch from 2013, atherosclerotic calcif, no adenopathy; f/u film 9/15=> improved, and serial f/u film 9/16=> clear, wnl...    HBP> on MetopER100, Lisin20; BP= 124/76 & she denies CP, palpit, SOB, edema; we discussed increasing her physical activity...    Cerebrovasc Dis> on ASA325 & Plavix75; Hx cerebrovasc dis, tiny aneurysm & TIA; stable w/o cerebral ischemic symptoms- 9/16 she mentioned that she'd like to have f/u MRI/MRA to recheck vasc status and the sm MCA aneurysm...    Chol> on Cres20; FLP 3/16 showed TChol 174, TG 200, HDL 45, LDL 89; rec to continue same med & diet efforts...    Hypothyroid> on Synthroid 146mcg/d; labs 3/16 showed TSH= 0.11 & she is clinically euthyroid, rec to step down her Syntroid dose to 155mcg/d...    Colon polyps> she had f/u colonoscopy 6/16 by DrDBrodie- 53mm sessile polyp removed from the desc colon=> tubular adenoma & they rec f/u colon in 5 yrs...    Breast cancer> Hx right breast cancer Dx 2/14 w/ ChemoRx, Surg, XRT; she continues f/u w/ DrMagrinat & DrWakefield Q64mo; last seen 8/16 & stable- no known recurrence, f/u mammogram left breast 8/16 remains neg...     DJD, FM, Osteop> on  Calcium, MVI, OTC analgesics; [facility] w/ lowest Tscore-1.5 in distal left radius; encouraged to continue supplements and wt bearing exercise...    Anxiety> on EffexorXR75 and she reports stable on this med...  EXAM showed Afeb, VSS, O2sat=96%;  HEENT- neg, mallampati2;  Chest- clear w/o w/r/r;  Heart- RR w/o m/r/g;  Abd- soft, neg;  Ext- w/o c/c/e;  Neuro- intact w/o focal neuro deficits...   CXR 9/16 showed norm heart size, clear lungs, +scoliosis, lower thoracic compression deformity is stable, NAD.Marland KitchenMarland Kitchen  EKG 04/2015 showed NSR, rate65, IRBBB, otherw norm EKG, NAD...  LABS 04/2015:  Chems- wnl;  CBC- wnl  MRI / MRA Brain => pending IMP/PLAN>>  Tunya continues to do well- no new complaints or concerns, but she is worried about the fact that she hasn't has a f/u MRI/MRA in 14yrs; she continues stable w/o cerebral ischemic symptoms on ASA /Plavix; we will order the scan for follow up;  She needs to increase her exercise program & will consider [employer]neakers at the [employer] at the cancer center classes...  ADDENDUM>>  MRI done 05/04/15- for some reason MRA was not done;  No infarct or hemorrhage, no intracranial mass or abn enhancement; mod small vessel dis & mild global atrophy; she needs MRA to assess the 1.5-81mm right MCA bifuraction aneurysm-- I discussed w/ pt & she decided to forego the MRA at this time since she remains asymptomatic...  ~  October 18, 2015:  20mo ROV & Daniele reports that she is stable, good 50mo interval, no new complaints or concerns;  She had left cat surg 2 wks ago by DrShapiro & planning right cat surg soon;  She notes that her neuropathy symptoms in feet>hands and she upped her Gabapentin to 300mg  Bid (helping);  Epic reveals that she had a scare 06/2015 w/ lump felt in right mastectomy scar & 72mm hypoechoic nodule on ultrasound; DrWakefield rec Bx & this was done 07/12/15 by radiology=> Path neg inflammed fibroadipose tissue c/w scar, no malignancy... We reviewed the  following medical problems during today's office visit >>     S/p cat surg> as noted, DrShapiro    AbnCXR> CXR 3/15 w/ new RUL airspace dis & CTChest 3/15  revealed GG opacities prob related to radiation therapy; incidental 38mm nodule in RML w/o ch from 2013, atherosclerotic calcif, no adenopathy; f/u film 9/15=> improved, and serial f/u film 9/16=> clear, wnl...    HBP> on MetopER100, Lisin20; BP= 124/70 & she denies CP, palpit, SOB, edema; we discussed increasing her physical activity...    Cerebrovasc Dis> on ASA325 & Plavix75; Hx cerebrovasc dis, tiny aneurysm & TIA; stable w/o cerebral ischemic symptoms- 9/16 she mentioned that she'd like to have f/u MRI/MRA to recheck vasc status and the sm MCA aneurysm=> mod small vessel dis, no evid intracranial mets, right MCA bifurcation aneurysm ~67mm size needs MRA or CTA to properly assess but she decided to hold off on additional scans for now...    Chol> on Cres20; FLP 3/17 showed TChol 195, TG 184, HDL 49, LDL 110; rec to continue same med & diet efforts...    Hypothyroid> on Synthroid 182mcg/d; labs 3/17 showed TSH= 2.23 & she is clinically euthyroid- continue same    Colon polyps> she had f/u colonoscopy 6/16 by DrDBrodie- 66mm sessile polyp removed from the desc colon=> tubular adenoma & they rec f/u colon in 5 yrs...    Breast cancer> Hx right breast cancer Dx 2/14 w/ ChemoRx, Surg, XRT; she continues f/u w/ DrMagrinat & DrWakefield Q3mo; last seen 8/16 & stable- no known recurrence, she had ultrasound bx 11/16 of right chest wall- NEG    DJD, FM, Osteop> on Calcium, MVI, OTC analgesics; BMD done 3/16 at [facility] w/ lowest Tscore-1.5 in distal left radius; encouraged to continue supplements and wt bearing exercise...    Neuro w/ mod small vessel dis & global atrophy on MRI + Neuropathy on Gabapentin 300mg  Bid    Anxiety> on EffexorXR75 and she reports stable on this med...  EXAM showed Afeb, VSS, O2sat=96%;  HEENT- neg, mallampati2;  Chest- clear w/o w/r/r;   Heart- RR w/o m/r/g;  Abd- soft, neg;  Ext- w/o c/c/e;  Neuro- intact w/o focal neuro deficits...   LABS 10/22/15>  FLP- ok on Cres20 w/ LDL=110;  Chems- wnl x BS=106;  CBC- wnl;  TSH=2.23;  VitD=47... IMP/PLAN>>  Othel has been stable & the right breast scare from 11/16 turned out to be inflammed adipose tissue/ scar;  Labs are OK & she continues to work on diet/ exercise, continue same meds; we discussed ROV recheck in 75mo...   ~  September 20, 2016:  80mo ROV & add-on appt requested due to cough, yellow sput, low grade fever along w/ fatigue & body aches;  URI=> ILI w/ assoc nausea, no vomiting, some abd discomfort & sl loose stool;  We discussed Rx w/ Depo80, Pred taper, Levaquin, Mucinex, & Hycodan, plus align, fluids, etc...     HxAbnCXR> CXR 3/15 w/ new RUL airspace dis & CTChest 3/15 revealed GG opacities prob related to radiation therapy; incidental 14mm nodule in RML w/o ch from 2013, atherosclerotic calcif, no adenopathy; f/u film 9/15=> improved, and serial f/u film 9/16=> clear, wnl... EXAM showed Afeb, VSS, O2sat=97% on RA;  HEENT- neg, mallampati2;  Chest- clear x few scat rhonchi w/o consolid/ wheezing;  Heart- RR w/o m/r/g;  Abd- soft, neg;  Ext- w/o c/c/e;  Neuro- intact w/o focal neuro deficits...   CXR 09/20/16 (independently reviewed by me in the PACS system) showed norm heart size, Ao atherosclerosis, clear lungs w/ mild left base atx, scoliosis of spine & osteopenia; Note- mild chr compression of lower Tspine vertebra... IMP/PLAN>>  Zayneb has an upper resp infection/ ILI/  AB exac=> discussed treatment w/ Depo80, Pred taper (see AVS), Levaquin500/d, Mucinex600Qid w/ fluids, & Hycodan prn cough...  ~  June 18, 2017:  59mo ROV & add-on appt requested for 3d hx dyspnea>  She was referred by DrMagrinat for phys therapy (it's helping her right arm) & is participating in the [place] program at the cancer center, going to the gym 2d/wk etc and she plans to participate in a silver  sneakers program going forward;  She notes that her breathing is doing well now- no cough, phlegm, hemoptysis; no CP, palpit, dizzy, edema; she denies abd pain, n/v, c/d, blood seen, etc... We gave her the 2018 FLU vaccine today...    She saw DrBowers and had lower lid blepharoplasty;  Her Ophthalmologist is Dr.Shapiro & he's done bilat cat surg in past...    She saw DrMagrinat- Oncology 03/28/17> hx right breast cancer & pos right axillary node 2014 treated w/ chemoRx, Surg, XRT; no evid of disease recurrence, she was c/o some arm discomfort & referred for phys therapy. We reviewed the following medical problems during today's office visit >>     AbnCXR> CXR 3/15 w/ new RUL airspace dis & CTChest 3/15 revealed GG opacities prob related to radiation therapy; incidental 37mm nodule in RML w/o ch from 2013, atherosclerotic calcif, no adenopathy; f/u film 9/15=> improved, and serial f/u film thru 2/18=> clear, wnl...    HBP> on MetopER100, Lisin20; BP= 136/62 & she denies CP, palpit, SOB, edema; we discussed increasing her physical activity...    Cerebrovasc Dis> on ASA325 & Plavix75; Hx cerebrovasc dis, tiny aneurysm & TIA; stable w/o cerebral ischemic symptoms- 9/16 she mentioned that she'd like to have f/u MRI/MRA to recheck vasc status and the sm MCA aneurysm=> mod small vessel dis, no evid intracranial mets, right MCA bifurcation aneurysm ~38mm size needs MRA or CTA to properly assess but she decided to hold off on additional scans for now...    Chol> on Cres20; FLP 11/18 shows TChol 159, TG 176, HDL 44, LDL 81; rec to continue same med & diet efforts...    Hypothyroid> on Synthroid 15mcg/d; labs 3/17 showed TSH= 2.23 & she is clinically euthyroid; f/u Labs 11/18 showed TSH=0.39...    Colon polyps> she had f/u colonoscopy 6/16 by DrDBrodie- 77mm sessile polyp removed from the desc colon=> tubular adenoma & they rec f/u colon in 5 yrs...    Breast cancer> Hx right breast cancer & pos axillary nodes Dx 2/14  w/ ChemoRx, Surg, XRT; she continues f/u w/ DrMagrinat & DrWakefield; ultrasound bx 11/16 of right chest wall- NEG; last seen 8/18 as above & no known recur.    DJD, FM, Osteop> on Calcium, MVI, OTC analgesics; BMD done 3/16 at [facility] w/ lowest Tscore-1.5 in distal left radius; encouraged to continue supplements and wt bearing exercise; f/u BMD 9/18 w/ lowest Tscore -0.4 in wrist...    Neuro w/ mod small vessel dis & global atrophy on MRI + Neuropathy on ASA, Plavix, Gabapentin 300mg  Bid>  We discussed the need for physical activity & mental activity like Lumosity...    Nyelli has noted some memory issues- she did not bring her med bottles w/ her today- we checked w/ [facility] on [address] & she last filled Zoloft100 in Jan2018 & last filled EffexorXR75 in Nov2017...    Anxiety> she did not bring med bottles to the [place] indicates that she has not been filling the Zoloft100 or the EffexorXR75... EXAM showed Afeb, VSS, O2sat=96% on RA;  HEENT- neg, mallampati2;  Chest- clear x few scat rhonchi w/o consolid/ wheezing;  Heart- RR w/o m/r/g;  Abd- soft, neg;  Ext- w/o c/c/e;  Neuro- intact w/o focal neuro deficits...   BMD 05/01/17>  Lowest Tscore -0.4 in forearm radius which is wnl-- rec to continue Calcium, MVI, & ~1000u VitD daily supplements...  LABS 06/18/17>  FLP- Chol parameters are wnl on Cres20 but TG=176 & rec better low fat diet/ get wt down;  TSH=0.39 on Synthroid112;  VitD=41 & rec to take OTC VitD supplement ~1000u daily... IMP/PLAN>>  Marshea will continue w/ the [employer] exercise program & consider silver sneakers etc;  We discucced mental activity as well- books, puzzles, consider Lumosity on the computer;  Give 2018 FLU vaccine...  ~  Jan 10, 2018:  25mo ROV & Courtney reports a good interval, notes some mild head & chest congestion, min cough w/ clear mucus, had recent vaca in [place] (grand daughter in grad-school studying polit sci);  sh'e beed feeling well, denies SOB/ CP/  palpit/ edema/ etc... She continues to follow up w/ DrMagrinat once per yr & doing satis;  She is working on her memory w/ Lumosity, puzzles, etc & notes she did well w/ travel plans etc...     Hx abn CXR after XRT for her breast cancer> last CXR 09/2016 ok w/ ?some COPD/ atx left base (blunt left angle)- NAD, aortic atherosclerosis    Hx HBP, cerebrovasc dis> on MetopER100, Lisin20, ASA325/ Plavix75 and stable...    Hx HL, hypothy> on Cres20, Synthroid112 & labs look good...    Hx breast cancer as noted- followed by DrMagrinat...    Hx DJD, FM, osteop> on calc, mvi, OTC analgesics prn; f/u BMD 9/18 w/ lowest Tscore -0.4 in wrist    Neuro- small vessel dis, atrophy, neuropathy> on the ASA/Plavix + Gabapentin 300Bid... EXAM showed Afeb, VSS, O2sat=96% on RA;  HEENT- neg, mallampati2;  Chest- clear x few scat rhonchi w/o consolid/ wheezing;  Heart- RR w/o m/r/g;  Abd- soft, neg;  Ext- w/o c/c/e;  Neuro- intact w/o focal neuro deficits...  IMP/PLAN>>  Donatella reports a good 42mo interval- no new complaints or concerns; we reviewed her prob list, rec continue same meds; given Prevnar-13 vaccination today & rec rov recheck in 60mo...   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 Marland Kitchen.. ~  Followed by DrMagrinat, DrWakefield, DrMurray w/ prev surg, chemoRx and XRT => SUMMARY:    1) status post right breast and right axillary lymph node biopsies February 2014 of a clinical T2, pN1, stage IIB invasive ductal carcinoma, grade 3, triple negative, with an MIB-1 of 15%.    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    4)status post right mastectomy and sentinel lymph node sampling 04/23/2013 for a residual pT1b pN1 invasive ductal carcinoma,  grade 3, again triple negative [2/10 sample lymph nodes were involved by tumor, both with micrometastatic deposits -- NOTE: she opted against reconstruction.    5) adjuvant radiation completed 08/13/2013    6) the patient underwent genetics counseling but declined testing. ~  2015: She reports that 41 y/o sister was Dx w/ breast cancer this yr...  ABNORMAL CXR >>  ~  3/15:  CXR showed new RUL airspace dis & CTChest 3/15 revealed GG opacities prob related to radiation therapy; incidental 52mm nodule in RML w/o ch from 2013, atherosclerotic calcif, no adenopathy... ~  9/15:  CXR 9/15 showed no infiltrate, opacity, etc; scoliosis & stable compression deformity; port has been removed... ~  9/16:  CXR 9/16 showed norm heart size, clear lungs, +scoliosis, lower thoracic compression deformity is stable, NAD...  HYPERTENSION (ICD-401.9) > ~  on TOPROL XL 100mg /d,  LISINOPRIL/ Hct 20/12.5 daily; BP well controlled on these meds; denies HA, fatigue, visual changes, CP, palipit, dizziness, syncope, dyspnea, edema, etc; she does water aerobics & walks for exercise...  ~  CXR 8/12 showed normal heart size, clear lungs, scoliosis, NAD... ~  10/12:  Presents w/ chest discomfort> EKG showed NSR, rate 69, rsr' in V1-2, otherw wnl; referred to Cards=> she requests DrKelly. ~  11/13:  BP= 128/82 & she remains asymptomatic... ~  7/14: on MetopER100 & LisinHCT20-12.5; BP= 128/72 but has been volatile at home & in Oncology office prompting DrMagrinat to change to Lisin10 & stop the Hct in light of her 103 wt loss...  ~  3/15: on MetopER100, Lisin20; BP= 112/64 & she denies CP,  palpit, SOB, edema ~  9/15: on MetopER100, Lisin20; BP= 122/68 & she denies CP, palpit, SOB, edema; she wants to decr the MeptopER to 50mg /d => but she never did... ~  3/16: on MetopER100, Lisin20; BP= 112/64 & she denies CP, palpit, SOB, edema ~  9/16: on MetopER100, Lisin20; BP= 124/76 & she remains asymptomatic; we discussed increasing her physical activity...  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 she's been stable since that time w/ out pt f/u by IR, [name]> she indicates he said no further studies needed. ~  CDopplers 5/04 showed mild plaque, no signif ICA stenoses... ~  MR studies 10/09 showed sm vessel dis; poss restenosis left MCA w/ angiogram rec- but wasn't done; mod stenosis at origin of left vertebral art w/ tortuosity... ~  Carotid arteriogram 12/10 showed stable mild residual left middle cerebral art stenosis at site of prev angioplasty, and stable 1.5 to 5mm saccular right middle cerebral art aneurysm... ~  10/12:  she remains asymptomatic- w/o cerebral ischemic symptoms... ~  10/13:  she continues stable on ASA/ Plavix w/o cerebral ischemic symptoms... ~  3/15 & 9/15: on ASA325 & Plavix75; s/p aneurysm & TIA; stable w/o cerebral ischemic symptoms- she has not had f/u DrDeveshwar-IR- she notes she was told no need for f/u angiography (we might consider MRI later) ~  3/16: on Blennerhassett; she denies cerebral ischemic symptoms & is stable... ~  9/16: stable on ASA/ Plavix and we decided to repeat her MRI/ MRA => pending  HYPERCHOLESTEROLEMIA (ICD-272.0) >>  ~  FLP 3/08 shows Tchol 180, TG 206, HDL 49, LDL 105... rec same meds, better diet, get wt down. ~  [place] 10/09 on Lip80 showed  TChol 150, TG 178, HDL 41, LDL 74 ~  FLP 1/11 on Lip80+Zetia10 showed TChol  162, TG 226, HDL 47, LDL 95... she wants to stop Zetia, get on diet & get wt down. ~  FLP 1/12 on Lip80 showed TChol 173, TG 164, HDL 45, LDL 95 ~  FLP 10/13 on Lip80 intermittently showed TChol 189, TG 231, HDL 45, LDL 112... She was c/o paresthesias that she was convinced was due to the Lip80 & she requested change to CRESTOR 20mg /d... ~  [place] 4/14 on Cres20 showed TChol 167, TG 141, HDL 46, LDL 93 ~  FLP 3/15 on Cres20 showed TChol 164, TG 116, HDL 49, LDL 92 ~  FLP 3/16 on Cres20 showed TChol 174, TG 200, HDL 45, LDL 89; we reviewed diet, exercise, wt reduction...  HYPOTHYROIDISM (ICD-244.9) - on SYNTHROID 176mcg/d... ~  labs 3/08 showed TSH = 0.23 ~  labs 10/09 showed TSH= 0.64 ~  labs 1/11 showed TSH= 0.18... she wants to keep same dose to aide wt reduction. ~  labs 1/12 showed TSH= 0.20... Ditto ~  Labs 10/13 on Levo125 showed TSH= 1.20 ~  Labs 3/15 on Synthroid125 showed TSH= 0.35 ~  Labs 3/16 on Synthroid125 showed TSH= 0.11; we decided to step down her Synthroid dose to 126mcg/d...  COLONIC POLYPS (ICD-211.3), & Hx of HEMORRHOIDS (ICD-455.6) ~  colonoscopy 10/00 by DrDBrodie showed hems only... f/u planned 74yrs. ~  f/u colonoscopy 11/10 showed 2 polyps- one adenomatous w/ f/u planned 31yrs. ~  she had f/u colonoscopy 6/16 by DrDBrodie- 5mm sessile polyp removed from the desc colon=> tubular adenoma & they rec f/u colon in 5 yrs...  DEGENERATIVE JOINT DISEASE (ICD-715.90) - uses Tylenol & OTC meds as needed... in 2004 seen by [facility] w/ soft tissue hemangioma found in right shoulder area... second opinion from DrWWard @  [facility] confirmed this- no surg necessary... ~  1/11:  notes some pain in hands & wrist... ~  1/12:  Stable w/o acute complaints or problem areas... ~  2014: she developed LBP & hip pain while receiving XRT for her breast cancer; eval by DrMagrinat & DrCaffrey w/ epid steroid shot that really helped... ~  She has a scoliosis and compression deformity in lower  TSpine...  Hx of FIBROMYALGIA (ICD-729.1)  OSTEOPOROSIS (ICD-733.00) - she indicates that this is followed & managed by GYN, DrCousins> ?when last BMD was done "she keeps up w/ this" > on Calcium, & Vitamins... ~  labs 1/11 showed Vit D level = 54... ~  Pt again reiterates that GYN follows her BMDs & is up to date... ~  BMD done 3/16 at [facility] w/ lowest Tscore-1.5 in distal left radius; encouraged to continue supplements and wt bearing exercise...  TIA (ICD-435.9) - as above, she remains on ASA325 & PLAVIX75... last saw DrReynolds in 2006... 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 arteriogram by Dr. Fleeta Emmer confirmed a web-like plaque in the left M1 segment of the left MCA with signif stenosis...  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  ANXIETY (ICD-300.00) - on EFFEXOR 75mg /d for hot flashes, she says.... she wishes to continue the med "it keeps me on an even keel".  Health Maintenance - GYN= DrCousins & she will call for  f/u... Mammograms at [facility]... BMDs at [facility] & results sent to DrCousins... ~  Immunizations: she refuses Flu vaccine... given PNEUMOVAX- 1/11, and Tdap- 1/11...   Past Surgical History:  Procedure Laterality Date  . BREAST BIOPSY Right 10/23/2012  . BREAST BIOPSY Right 10/08/2012  . COLONOSCOPY    . COLONOSCOPY W/ POLYPECTOMY    . ESOPHAGOGASTRODUODENOSCOPY    . Left middle cerebral artery angioplasty  2004   by [name] had 2 follow up occurances  . MASTECTOMY Right   . MASTECTOMY W/ SENTINEL NODE BIOPSY Right 04/23/2013   Procedure: RIGHT TOTAL MASTECTOMY WITH SENTINEL LYMPH NODE BIOPSY;  Surgeon: [name], MD;  Location: [place];  Service: General;  Laterality: Right;  . PORTACATH PLACEMENT  N/A 11/05/2012   Procedure: INSERTION PORT-A-CATH;  Surgeon: [name], MD;  Location: [place];  Service: General;  Laterality: N/A;  . RADIOLOGY WITH ANESTHESIA N/A 05/04/2015   Procedure: MRI BRAIN WITH AND WITHOUT;  Surgeon: Medication Radiologist, MD;  Location: [place];  Service: Radiology;  Laterality: N/A;    Outpatient Encounter Medications as of 07/15/2018  Medication Sig  . aspirin EC 325 MG tablet Take 325 mg by mouth every morning.   . Calcium Carbonate (CALTRATE 600 PO) Take by mouth 2 (two) times daily.  . cetirizine (ZYRTEC) 10 MG tablet Take by mouth.  . clopidogrel (PLAVIX) 75 MG tablet TAKE 1 TABLET(75 MG) BY MOUTH DAILY  . levothyroxine (SYNTHROID, LEVOTHROID) 112 MCG tablet TAKE 1 TABLET(112 MCG) BY MOUTH DAILY BEFORE BREAKFAST  . metoprolol succinate (TOPROL-XL) 100 MG 24 hr tablet TAKE 1 TABLET BY MOUTH EVERY DAY WITH OR IMMEDIATELY FOLLOWING A MEAL  . metroNIDAZOLE (METROCREAM) 0.75 % cream Apply 1 application topically 2 (two) times daily. For rosacea  . Multiple Vitamin (MULTIVITAMIN WITH MINERALS) TABS tablet Take 1 tablet by mouth every morning. Centrum Silver  . rosuvastatin (CRESTOR) 20 MG tablet TAKE 1 TABLET BY MOUTH DAILY  . sertraline (ZOLOFT) 100 MG tablet TAKE 1 TABLET BY MOUTH ONCE DAILY  . [DISCONTINUED] SYNTHROID 112 MCG tablet TAKE ONE TABLET BY MOUTH ONCE DAILY BEFORE  BREAKFAST   No facility-administered encounter medications on file as of 07/15/2018.     No Known Allergies   Immunization History  Administered Date(s) Administered  . Influenza Split 05/28/2012  . Influenza, High Dose Seasonal PF 07/13/2016, 06/18/2017, 07/15/2018  . Influenza,inj,Quad PF,6+ Mos 06/05/2013, 05/13/2014, 04/20/2015  . Pneumococcal Conjugate-13 01/10/2018  . Pneumococcal Polysaccharide-23 08/26/2009  . Td 08/26/2009    Current Medications, Allergies, Past Medical History, Past Surgical History, Family History, and Social History were  reviewed in [employer] record.     She is an ex-smoker & quit in 1987 w/ ~20 pack-yr smoking hx...    Several family members have had cancer: Mother (nonsmoker) died at 33 w/ lung cancer- one of her sisters also had lung cancer, and a brother had colon cancer (both dx in 36s); Macaria had one brother who died from colon cancer at 49, & that bro had a daughter who was dx w/ uterine cancer at 23;  Pt also has one sister dx w/ breast cancer in 2015... NOTE: pt declined genetic testing...   Review of Systems    Constitutional:  Denies F/C/S, appetite is good, weight is stable ~160# HEENT:  No HA, visual changes, earache, nasal symptoms, sore throat, hoarseness. Resp:  No cough, sputum, hemoptysis; intermit SOB/DOE w/o tightness or wheezing. Cardio:  No CP, palpit, orthopnea, edema; notes some DOE  GI:  Denies N/V/D/C or blood in stool; no reflux, abd pain, distention, or gas. GU:  No dysuria, freq, urgency, hematuria, or flank pain. MS:  Denies joint pain, swelling, tenderness; no neck pain, back pain, etc. Neuro:  No tremors, seizures, dizziness, syncope; prev weakness & numbness improved, gait is ok... Skin:  No suspicious lesions or skin rash. Heme:  No bruising, bleeding, etc; axillary adenopathy is resolved Psyche: Denies confusion, sleep disturbance, hallucinations, anxiety, depression.   Objective:   Physical Exam      WD, WN, 77 y/o WF in NAD... GENERAL:  Alert & oriented; pleasant & cooperative... HEENT:  Flasher/AT, EOM-wnl, PERRLA, EACs-clear, TMs-wnl, NOSE-clear, THROAT-clear & wnl. NECK:  Supple w/ fairROM; no JVD; normal carotid impulses w/o bruits; no thyromegaly or nodules palpated; no lymphadenopathy. CHEST:  Clear to P&A w/o wheezing, rales, or rhonchi detected... HEART:  Regular Rhythm; without murmurs/ rubs/ or gallops. ABDOMEN:  Soft & nontender; normal bowel sounds; no organomegaly or masses palpated. EXT: without deformities, mild arthritic changes;  no varicose veins/ +venous insuffic/ no edema. NEURO:  CN's intact;  no focal neuro deficits... DERM:  No lesions noted; no rash etc...  RADIOLOGY DATA:  Reviewed in the EPIC EMR & discussed w/ the patient...  LABORATORY DATA:  Reviewed in the EPIC EMR & discussed w/ the patient...   Assessment & Plan:    01/10/18>   Kessa reports a good 71mo interval- no new complaints or concerns; we reviewed her prob list, rec continue same meds; given Prevnar-13 vaccination today & rec rov recheck in 58mo...   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 She continues f/u w/ DrMagrinat & DrWakefield yearly...   ASTHMATIC BRONCHITIS>  No recent URIs or resp exac & she is once again not on regular meds, denies breathing problems, etc... Abn CXR>  Faint GG opacity in RUL has resolved (likely related to XRT); she is at baseline, stable... 09/20/16>   Pina has an upper resp infection/ ILI/ AB exac=> discussed treatment w/ Depo80, Pred taper (see AVS), Levaquin500/d, Mucinex600Qid w/ fluids, & Hycodan prn cough   HBP>  Controlled on BBlocker, & ACE, she continues to watch BP at home...  Cerebrovasc Dis>  She had TIA 2004 w/ eval revealing left MCA stenosis; DrDeveshwar did PTA w/ good result; incidental 1-90mm aneurysm noted in the right MCA; she has remained asymptomatic since 2004 on the ASA/ Plavix. We will f/u MRI / MRA 9/16 => pending  CHOL>  Controlled on Cres20 (she was intol to Lip80), needs better low fat diet...  HPYOTHYROID>  On Synthroid 115mcg/d w/ TSH = 0/11 so we will step down her dose to 130mcg/d...  Other medical problems as noted... 10/18/15>   Naiya has been stable & the right breast scare from 11/16 turned out to be inflammed adipose tissue/ scar;  Labs are OK & she continues to work on diet/ exercise, continue same meds; we discussed ROV recheck in  104mo.   Patient's Medications  New Prescriptions   No medications on file  Previous Medications   ASPIRIN EC 325 MG TABLET    Take 325 mg by mouth every morning.    CALCIUM CARBONATE (CALTRATE 600 PO)    Take by mouth 2 (two) times daily.   CETIRIZINE (ZYRTEC) 10 MG TABLET    Take by mouth.   CLOPIDOGREL (PLAVIX) 75 MG TABLET    TAKE 1 TABLET(75 MG) BY  MOUTH DAILY   LEVOTHYROXINE (SYNTHROID, LEVOTHROID) 112 MCG TABLET    TAKE 1 TABLET(112 MCG) BY MOUTH DAILY BEFORE BREAKFAST   METOPROLOL SUCCINATE (TOPROL-XL) 100 MG 24 HR TABLET    TAKE 1 TABLET BY MOUTH EVERY DAY WITH OR IMMEDIATELY FOLLOWING A MEAL   METRONIDAZOLE (METROCREAM) 0.75 % CREAM    Apply 1 application topically 2 (two) times daily. For rosacea   MULTIPLE VITAMIN (MULTIVITAMIN WITH MINERALS) TABS TABLET    Take 1 tablet by mouth every morning. Centrum Silver   ROSUVASTATIN (CRESTOR) 20 MG TABLET    TAKE 1 TABLET BY MOUTH DAILY   SERTRALINE (ZOLOFT) 100 MG TABLET    TAKE 1 TABLET BY MOUTH ONCE DAILY  Modified Medications   No medications on file  Discontinued Medications   SYNTHROID 112 MCG TABLET    TAKE ONE TABLET BY MOUTH ONCE DAILY BEFORE  BREAKFAST

## 2018-07-18 ENCOUNTER — Telehealth: Payer: Self-pay | Admitting: Pulmonary Disease

## 2018-07-18 MED ORDER — METOPROLOL SUCCINATE ER 100 MG PO TB24: ORAL_TABLET | ORAL | 6 refills | Status: DC

## 2018-07-18 MED ORDER — CLOPIDOGREL BISULFATE 75 MG PO TABS: ORAL_TABLET | ORAL | 6 refills | Status: DC

## 2018-07-18 MED ORDER — SERTRALINE HCL 100 MG PO TABS: 100.0000 mg | ORAL_TABLET | Freq: Every day | ORAL | 6 refills | Status: DC

## 2018-07-18 NOTE — Telephone Encounter (Signed)
Refills sent for  Crestor, Plavix, and Zoloft, to [facility] on [address], per Patient request.  Lisinopril is not on Patient med list.  Nothing further at this time.

## 2018-07-18 NOTE — Telephone Encounter (Signed)
Lattie Haw, please advise on this for pt. Thanks!

## 2018-07-22 ENCOUNTER — Other Ambulatory Visit (INDEPENDENT_AMBULATORY_CARE_PROVIDER_SITE_OTHER): Payer: Medicare Other

## 2018-07-22 ENCOUNTER — Ambulatory Visit (INDEPENDENT_AMBULATORY_CARE_PROVIDER_SITE_OTHER)
Admission: RE | Admit: 2018-07-22 | Discharge: 2018-07-22 | Disposition: A | Discharge status: Home / Self Care | Payer: Medicare Other | Source: Ambulatory Visit | Attending: Pulmonary Disease | Admitting: Pulmonary Disease

## 2018-07-22 DIAGNOSIS — J4520 Mild intermittent asthma, uncomplicated: Secondary | ICD-10-CM

## 2018-07-22 DIAGNOSIS — E7800 Pure hypercholesterolemia, unspecified: Secondary | ICD-10-CM

## 2018-07-22 DIAGNOSIS — F411 Generalized anxiety disorder: Secondary | ICD-10-CM | POA: Diagnosis not present

## 2018-07-22 DIAGNOSIS — J40 Bronchitis, not specified as acute or chronic: Secondary | ICD-10-CM | POA: Diagnosis not present

## 2018-07-22 LAB — LIPID PANEL
Cholesterol: 183 mg/dL (ref 0–200)
HDL: 48.5 mg/dL (ref >39.00)
NonHDL: 134.73
TRIGLYCERIDES: 287 mg/dL — AB (ref 0.0–149.0)
Total CHOL/HDL Ratio: 4
VLDL: 57.4 mg/dL — ABNORMAL HIGH (ref 0.0–40.0)

## 2018-07-22 LAB — TSH: TSH: 5.3 u[IU]/mL — ABNORMAL HIGH (ref 0.35–4.50)

## 2018-07-22 LAB — LDL CHOLESTEROL, DIRECT: Direct LDL: 105 mg/dL

## 2018-07-23 ENCOUNTER — Other Ambulatory Visit: Payer: Self-pay | Admitting: Oncology

## 2018-07-23 ENCOUNTER — Ambulatory Visit
Admission: RE | Admit: 2018-07-23 | Discharge: 2018-07-23 | Disposition: A | Discharge status: Home / Self Care | Payer: Medicare Other | Source: Ambulatory Visit | Attending: Oncology | Admitting: Oncology

## 2018-07-23 DIAGNOSIS — Z1231 Encounter for screening mammogram for malignant neoplasm of breast: Secondary | ICD-10-CM

## 2018-07-25 ENCOUNTER — Telehealth: Payer: Self-pay | Admitting: Pulmonary Disease

## 2018-07-25 DIAGNOSIS — L578 Other skin changes due to chronic exposure to nonionizing radiation: Secondary | ICD-10-CM | POA: Diagnosis not present

## 2018-07-25 DIAGNOSIS — L570 Actinic keratosis: Secondary | ICD-10-CM | POA: Diagnosis not present

## 2018-07-25 DIAGNOSIS — D239 Other benign neoplasm of skin, unspecified: Secondary | ICD-10-CM | POA: Diagnosis not present

## 2018-07-25 DIAGNOSIS — L821 Other seborrheic keratosis: Secondary | ICD-10-CM | POA: Diagnosis not present

## 2018-07-25 DIAGNOSIS — Z1283 Encounter for screening for malignant neoplasm of skin: Secondary | ICD-10-CM | POA: Diagnosis not present

## 2018-07-25 DIAGNOSIS — Z86018 Personal history of other benign neoplasm: Secondary | ICD-10-CM | POA: Diagnosis not present

## 2018-07-26 NOTE — Telephone Encounter (Signed)
LMTCB

## 2018-07-30 ENCOUNTER — Other Ambulatory Visit: Payer: Self-pay | Admitting: Pulmonary Disease

## 2018-08-05 ENCOUNTER — Other Ambulatory Visit: Payer: Self-pay | Admitting: Pulmonary Disease

## 2018-08-06 ENCOUNTER — Telehealth: Payer: Self-pay | Admitting: Pulmonary Disease

## 2018-08-06 NOTE — Telephone Encounter (Signed)
Spoke with pt, advised her I was not able to send in the Lisinopril since it was not on her list. She states she has been on the medication for for years and doesn't know how it got left off. In SN notes it states she is well controlled on Lisinopril/HCT but pt states she is only on Lisinopril 20mg . SN can we send in the plain Lisinopril? Please advise.    HYPERTENSION (ICD-401.9) > ~  on TOPROL XL 100mg /d,  LISINOPRIL/ Hct 20/12.5 daily; BP well controlled on these meds; denies HA, fatigue, visual changes, CP, palipit, dizziness, syncope, dyspnea, edema, etc; she does water aerobics & walks for exercise...  ~  CXR 8/12 showed normal heart size, clear lungs, scoliosis, NAD... ~  10/12:  Presents w/ chest discomfort> EKG showed NSR, rate 69, rsr' in V1-2, otherw wnl; referred to Cards=> she requests DrKelly. ~  11/13:  BP= 128/82 & she remains asymptomatic... ~  7/14: on MetopER100 & LisinHCT20-12.5; BP= 128/72 but has been volatile at home & in Oncology office prompting DrMagrinat to change to Lisin10 & stop the Hct in light of her 103 wt loss...  ~  3/15: on MetopER100, Lisin20; BP= 112/64 & she denies CP, palpit, SOB, edema ~  9/15: on MetopER100, Lisin20; BP= 122/68 & she denies CP, palpit, SOB, edema; she wants to decr the MeptopER to 50mg /d => but she never did... ~  3/16: on MetopER100, Lisin20; BP= 112/64 & she denies CP, palpit, SOB, edema ~  9/16: on MetopER100, Lisin20; BP= 124/76 & she remains asymptomatic; we discussed increasing her physical activity.Marland KitchenMarland Kitchen

## 2018-08-08 ENCOUNTER — Other Ambulatory Visit: Payer: Self-pay | Admitting: Primary Care

## 2018-08-08 MED ORDER — LISINOPRIL 20 MG PO TABS: 20.0000 mg | ORAL_TABLET | Freq: Every day | ORAL | 1 refills | Status: DC

## 2018-08-08 NOTE — Telephone Encounter (Signed)
Called and spoke with patient regarding Lisinopril 20mg  rx needing refilled. At this time it is not listed on pt's med list; pt advised she has taken this medication for years. Pt last seen SN on 07/15/2018, no lisinopril was listed on that note. Pt Lisinopril was last filled by SN on 01/10/2018 It was taken off of the patient's medication list on 01/10/2018  Reviewed this account with Ilda Foil, went back to SN note on 01/10/2018 to con't ace and monitor BP Beth verbally agreed to refill pt's lisinopril 20mg  today with refill Placed order today for lisinopril 20mg  take one tab daily Called pt to advise that rx has be sent to the pharmacy. She verbalized understanding, and thanked me for researching this information Nothing further needed at this time.

## 2018-08-18 ENCOUNTER — Other Ambulatory Visit: Payer: Self-pay | Admitting: Pulmonary Disease

## 2018-09-02 ENCOUNTER — Encounter: Payer: Self-pay | Admitting: Podiatry

## 2018-09-02 ENCOUNTER — Other Ambulatory Visit: Payer: Self-pay | Admitting: Podiatry

## 2018-09-02 ENCOUNTER — Ambulatory Visit (INDEPENDENT_AMBULATORY_CARE_PROVIDER_SITE_OTHER): Payer: Medicare Other | Admitting: Podiatry

## 2018-09-02 ENCOUNTER — Ambulatory Visit (INDEPENDENT_AMBULATORY_CARE_PROVIDER_SITE_OTHER): Payer: Medicare Other

## 2018-09-02 DIAGNOSIS — M779 Enthesopathy, unspecified: Secondary | ICD-10-CM

## 2018-09-02 DIAGNOSIS — B351 Tinea unguium: Secondary | ICD-10-CM | POA: Diagnosis not present

## 2018-09-02 DIAGNOSIS — M79671 Pain in right foot: Secondary | ICD-10-CM

## 2018-09-02 DIAGNOSIS — M7751 Other enthesopathy of right foot: Secondary | ICD-10-CM | POA: Diagnosis not present

## 2018-09-02 MED ORDER — TRIAMCINOLONE ACETONIDE 10 MG/ML IJ SUSP
10.0000 mg | Freq: Once | INTRAMUSCULAR | Status: AC
  Administered 2018-09-02: 10 mg

## 2018-09-03 NOTE — Progress Notes (Signed)
Subjective:   Patient ID: Debra Mcintyre, female   DOB: 78 y.o.   MRN: [id number]   HPI Patient presents with a dorsal inflammation around the midfoot right that she states is come up recently.  Patient states is very tender and is been hard for her to wear any shoes that come across that area and she does not remember change in shoe gears.  Patient does not smoke currently and likes to be active   Review of Systems  All other systems reviewed and are negative.       Objective:  Physical Exam Vitals signs and nursing note reviewed.  Constitutional:      Appearance: She is well-developed.  Pulmonary:     Effort: Pulmonary effort is normal.  Musculoskeletal: Normal range of motion.  Skin:    General: Skin is warm.  Neurological:     Mental Status: She is alert.     Neurovascular status intact muscle strength is adequate patient found to have midfoot dorsal discomfort around the extensor complex with inflammation fluid and pain with palpation.  There is no muscle strength issues and patient was found to have good digital perfusion well oriented x3     Assessment:  Probability for bone spur formation arthritis with acute extensor tendinitis right     Plan:  H&P condition reviewed and today I did a sterile prep dorsal foot and injected the extensor tendon complex 3 mg Kenalog 5 mg Xylocaine and advised on wider shoes heat and ice therapy and if symptoms persist may require bone surgery.  Explained spur formation and probable midfoot arthritis  X-rays indicate there is some spurring around the midtarsal joint right with the probability for low-grade arthritic condition of the joint surfaces

## 2018-09-19 ENCOUNTER — Ambulatory Visit: Payer: Medicare Other | Admitting: Family Medicine

## 2018-09-24 ENCOUNTER — Encounter: Payer: Self-pay | Admitting: Internal Medicine

## 2018-09-24 ENCOUNTER — Ambulatory Visit (INDEPENDENT_AMBULATORY_CARE_PROVIDER_SITE_OTHER): Payer: Medicare Other | Admitting: Internal Medicine

## 2018-09-24 VITALS — BP 134/68 | HR 86 | Temp 98.2°F | Ht 66.93 in | Wt 164.4 lb

## 2018-09-24 DIAGNOSIS — E039 Hypothyroidism, unspecified: Secondary | ICD-10-CM | POA: Diagnosis not present

## 2018-09-24 DIAGNOSIS — Z171 Estrogen receptor negative status [ER-]: Secondary | ICD-10-CM

## 2018-09-24 DIAGNOSIS — I10 Essential (primary) hypertension: Secondary | ICD-10-CM

## 2018-09-24 DIAGNOSIS — I671 Cerebral aneurysm, nonruptured: Secondary | ICD-10-CM | POA: Diagnosis not present

## 2018-09-24 DIAGNOSIS — C50411 Malignant neoplasm of upper-outer quadrant of right female breast: Secondary | ICD-10-CM

## 2018-09-24 DIAGNOSIS — E7800 Pure hypercholesterolemia, unspecified: Secondary | ICD-10-CM

## 2018-09-24 DIAGNOSIS — G452 Multiple and bilateral precerebral artery syndromes: Secondary | ICD-10-CM | POA: Diagnosis not present

## 2018-09-24 MED ORDER — LEVOTHYROXINE SODIUM 125 MCG PO TABS: 125.0000 ug | ORAL_TABLET | Freq: Every day | ORAL | 3 refills | Status: DC

## 2018-09-24 NOTE — Patient Instructions (Signed)
-  Nice meeting you today!  -Increase synthroid to 125 mcg daily.  -Schedule follow up in 8 weeks to recheck thyroid levels.

## 2018-09-24 NOTE — Progress Notes (Signed)
Established Patient Office Visit     CC/Reason for Visit: Establish care, follow up on chronic conditions  HPI: Debra Mcintyre is a 78 y.o. female who is coming in today for the above mentioned reasons. Due for AWV in Dec 2020. Past Medical History is significant for: HTN well controlled on lisinopril and metoprolol, HLD on statin well controlled, altho TG are 287, hypothyroidism with a TSH in 12/19 that was elevated to 5.3, remote h/o breast cancer s/p mastectomy, chemo,radiation that has been released from oncology care. She has had a TIA in the past and has been on Plavix ever since. She wonders why her thyroid dose was not changed, also has questions about treating her elev TG; otherwise no acute complaints today.   Past Medical/Surgical History: Past Medical History:  Diagnosis Date  . Breast cancer ([place]) 09/2012   right breast/right axillary lymph node t2,pn1, stage 11b, invasive ductal carcinma, grade 3, triple negative, with an mib-1 of 15%  . Cerebral aneurysm, nonruptured   . Cerebrovascular disease, unspecified   . Colonic polyp   . Dizziness    pt. states that she experiences this occassionally 05/03/15  . Headache    occassionally  . Heart murmur   . History of chemotherapy   . Hx of radiation therapy 05/27/13-08/13/13   right chest wall/right supraclavicular/axillary region 5220 cGy 29 sessions, right mastectomy/chest wall boost cGy 5 sessions  . Hypertension    Does not see a cardiologist  . Hypothyroidism   . Neuropathy   . Pure hypercholesterolemia   . Rosacea   . Seasonal allergies   . TIA (transient ischemic attack)   . Wears glasses     Past Surgical History:  Procedure Laterality Date  . BREAST BIOPSY Right 10/23/2012  . BREAST BIOPSY Right 10/08/2012  . COLONOSCOPY    . COLONOSCOPY W/ POLYPECTOMY    . ESOPHAGOGASTRODUODENOSCOPY    . Left middle cerebral artery angioplasty  2004   by [name] had 2 follow up occurances  . MASTECTOMY  Right   . MASTECTOMY W/ SENTINEL NODE BIOPSY Right 04/23/2013   Procedure: RIGHT TOTAL MASTECTOMY WITH SENTINEL LYMPH NODE BIOPSY;  Surgeon: [name], MD;  Location: [place];  Service: General;  Laterality: Right;  . PORTACATH PLACEMENT N/A 11/05/2012   Procedure: INSERTION PORT-A-CATH;  Surgeon: [name], MD;  Location: [place];  Service: General;  Laterality: N/A;  . RADIOLOGY WITH ANESTHESIA N/A 05/04/2015   Procedure: MRI BRAIN WITH AND WITHOUT;  Surgeon: Medication Radiologist, MD;  Location: [place];  Service: Radiology;  Laterality: N/A;    Social History:  reports that she quit smoking about 32 years ago. Her smoking use included cigarettes. She has a 20.00 pack-year smoking history. She has never used smokeless tobacco. She reports that she does not drink alcohol or use drugs.  Allergies: No Known Allergies  Family History:  Family History  Problem Relation Age of Onset  . Lung cancer Mother 101  . Heart disease Father   . Colon cancer Brother 25  . Alcohol abuse Brother   . Colon cancer Maternal Uncle        dx in his 77s  . Colon cancer Maternal Aunt   . Ovarian cancer Other 85  . Lung cancer Maternal Aunt   . Breast cancer Sister      Current Outpatient Medications:  .  aspirin EC 325 MG tablet, Take 325 mg by mouth every morning. , Disp: , Rfl:  .  Calcium Carbonate (CALTRATE 600 PO), Take by mouth 2 (two) times daily., Disp: , Rfl:  .  cetirizine (ZYRTEC) 10 MG tablet, Take by mouth., Disp: , Rfl:  .  clopidogrel (PLAVIX) 75 MG tablet, TAKE 1 TABLET(75 MG) BY MOUTH DAILY, Disp: 30 tablet, Rfl: 6 .  lisinopril (PRINIVIL,ZESTRIL) 20 MG tablet, TAKE 1 TABLET BY MOUTH DAILY, Disp: 90 tablet, Rfl: 0 .  metoprolol succinate (TOPROL-XL) 100 MG 24 hr tablet, TAKE 1 TABLET BY MOUTH EVERY DAY WITH OR IMMEDIATELY FOLLOWING A MEAL, Disp: 30 tablet, Rfl: 6 .  Multiple Vitamin (MULTIVITAMIN WITH MINERALS) TABS tablet, Take 1 tablet by mouth every  morning. Centrum Silver, Disp: , Rfl:  .  rosuvastatin (CRESTOR) 20 MG tablet, TAKE 1 TABLET BY MOUTH DAILY, Disp: 90 tablet, Rfl: 2 .  sertraline (ZOLOFT) 100 MG tablet, Take 1 tablet (100 mg total) by mouth daily., Disp: 30 tablet, Rfl: 6 .  levothyroxine (SYNTHROID, LEVOTHROID) 125 MCG tablet, Take 1 tablet (125 mcg total) by mouth daily., Disp: 90 tablet, Rfl: 3  Review of Systems:  Constitutional: Denies fever, chills, diaphoresis, appetite change and fatigue.  HEENT: Denies photophobia, eye pain, redness, hearing loss, ear pain, congestion, sore throat, rhinorrhea, sneezing, mouth sores, trouble swallowing, neck pain, neck stiffness and tinnitus.   Respiratory: Denies SOB, DOE, cough, chest tightness,  and wheezing.   Cardiovascular: Denies chest pain, palpitations and leg swelling.  Gastrointestinal: Denies nausea, vomiting, abdominal pain, diarrhea, constipation, blood in stool and abdominal distention.  Genitourinary: Denies dysuria, urgency, frequency, hematuria, flank pain and difficulty urinating.  Endocrine: Denies: hot or cold intolerance, sweats, changes in hair or nails, polyuria, polydipsia. Musculoskeletal: Denies myalgias, back pain, joint swelling, arthralgias and gait problem.  Skin: Denies pallor, rash and wound.  Neurological: Denies dizziness, seizures, syncope, weakness, light-headedness, numbness and headaches.  Hematological: Denies adenopathy. Easy bruising, personal or family bleeding history  Psychiatric/Behavioral: Denies suicidal ideation, mood changes, confusion, nervousness, sleep disturbance and agitation    Physical Exam: Vitals:   09/24/18 1304  BP: 134/68  Pulse: 86  Temp: 98.2 F (36.8 C)  TempSrc: Oral  SpO2: 96%  Weight: 164 lb 6.4 oz (74.6 kg)  Height: 5' 6.93" (1.7 m)    Body mass index is 25.8 kg/m.   Constitutional: NAD, calm, comfortable Eyes: PERRL, lids and conjunctivae normal ENMT: Mucous membranes are moist.  Neck: normal,  supple, no masses, no thyromegaly Respiratory: clear to auscultation bilaterally, no wheezing, no crackles. Normal respiratory effort. No accessory muscle use.  Cardiovascular: Regular rate and rhythm, no murmurs / rubs / gallops. No extremity edema. 2+ pedal pulses. No carotid bruits.  Musculoskeletal: no clubbing / cyanosis. No joint deformity upper and lower extremities. Good ROM, no contractures. Normal muscle tone.  Psychiatric: Normal judgment and insight. Alert and oriented x 3. Normal mood.    Impression and Plan:  Hypothyroidism, unspecified type  -Increase synthroid from 112 to 125 mcg. -She will return in 8 weeks to check TSH.  Essential hypertension -Well controlled on current medications.  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 -Released from neurology.  Malignant neoplasm of upper-outer quadrant of right breast in female, estrogen receptor negative ([place]) -Historical. Released from oncology care.  HYPERCHOLESTEROLEMIA -Last LDL 105 in 12/19, TG 287. -Continue statin. -Can consider increasing statin vs adding a fibrate if TG remain elevated at next check. Mild concern for rhabdo with combined statin and fibrate.    Patient Instructions  -  Nice meeting you today!  -Increase synthroid to 125 mcg daily.  -Schedule follow up in 8 weeks to recheck thyroid levels.      Lelon Frohlich, MD [facility]

## 2018-10-30 ENCOUNTER — Other Ambulatory Visit: Payer: Self-pay | Admitting: Pulmonary Disease

## 2018-11-18 ENCOUNTER — Other Ambulatory Visit (INDEPENDENT_AMBULATORY_CARE_PROVIDER_SITE_OTHER): Payer: Medicare Other

## 2018-11-18 ENCOUNTER — Other Ambulatory Visit: Payer: Self-pay

## 2018-11-18 ENCOUNTER — Other Ambulatory Visit: Payer: Self-pay | Admitting: Internal Medicine

## 2018-11-18 DIAGNOSIS — E039 Hypothyroidism, unspecified: Secondary | ICD-10-CM

## 2018-11-18 LAB — TSH: TSH: 1.23 u[IU]/mL (ref 0.35–4.50)

## 2018-11-19 ENCOUNTER — Ambulatory Visit (INDEPENDENT_AMBULATORY_CARE_PROVIDER_SITE_OTHER): Payer: Medicare Other | Admitting: Internal Medicine

## 2018-11-19 DIAGNOSIS — E039 Hypothyroidism, unspecified: Secondary | ICD-10-CM | POA: Diagnosis not present

## 2018-11-19 DIAGNOSIS — I10 Essential (primary) hypertension: Secondary | ICD-10-CM | POA: Diagnosis not present

## 2018-11-19 DIAGNOSIS — E7800 Pure hypercholesterolemia, unspecified: Secondary | ICD-10-CM

## 2018-11-19 DIAGNOSIS — G452 Multiple and bilateral precerebral artery syndromes: Secondary | ICD-10-CM

## 2018-11-19 NOTE — Progress Notes (Signed)
Virtual Visit via Video Note  I connected with Debra Mcintyre on 11/19/18 at  3:00 PM EDT by a video enabled telemedicine application and verified that I am speaking with the correct person using two identifiers.  Location patient: home Location provider: work office Persons participating in the virtual visit: patient, provider  I discussed the limitations of evaluation and management by telemedicine and the availability of in person appointments. The patient expressed understanding and agreed to proceed.   HPI: This is a scheduled follow up for hypothyroidism. TSH in 12/19 was noted to be elevated to 5.3. We increased her synthroid from 112 to 125 mcg. Repeat TSH yesterday was 1.23. She has had no issues with change in medication dose. She has no acute complaints. She has not checked her BP since I last saw her in the office.   ROS: Constitutional: Denies fever, chills, diaphoresis, appetite change and fatigue.  HEENT: Denies photophobia, eye pain, redness, hearing loss, ear pain, congestion, sore throat, rhinorrhea, sneezing, mouth sores, trouble swallowing, neck pain, neck stiffness and tinnitus.   Respiratory: Denies SOB, DOE, cough, chest tightness,  and wheezing.   Cardiovascular: Denies chest pain, palpitations and leg swelling.  Gastrointestinal: Denies nausea, vomiting, abdominal pain, diarrhea, constipation, blood in stool and abdominal distention.  Genitourinary: Denies dysuria, urgency, frequency, hematuria, flank pain and difficulty urinating.  Endocrine: Denies: hot or cold intolerance, sweats, changes in hair or nails, polyuria, polydipsia. Musculoskeletal: Denies myalgias, back pain, joint swelling, arthralgias and gait problem.  Skin: Denies pallor, rash and wound.  Neurological: Denies dizziness, seizures, syncope, weakness, light-headedness, numbness and headaches.  Hematological: Denies adenopathy. Easy bruising, personal or family bleeding history   Psychiatric/Behavioral: Denies suicidal ideation, mood changes, confusion, nervousness, sleep disturbance and agitation   Past Medical History:  Diagnosis Date  . Breast cancer ([place]) 09/2012   right breast/right axillary lymph node t2,pn1, stage 11b, invasive ductal carcinma, grade 3, triple negative, with an mib-1 of 15%  . Cerebral aneurysm, nonruptured   . Cerebrovascular disease, unspecified   . Colonic polyp   . Dizziness    pt. states that she experiences this occassionally 05/03/15  . Headache    occassionally  . Heart murmur   . History of chemotherapy   . Hx of radiation therapy 05/27/13-08/13/13   right chest wall/right supraclavicular/axillary region 5220 cGy 29 sessions, right mastectomy/chest wall boost cGy 5 sessions  . Hypertension    Does not see a cardiologist  . Hypothyroidism   . Neuropathy   . Pure hypercholesterolemia   . Rosacea   . Seasonal allergies   . TIA (transient ischemic attack)   . Wears glasses     Past Surgical History:  Procedure Laterality Date  . BREAST BIOPSY Right 10/23/2012  . BREAST BIOPSY Right 10/08/2012  . COLONOSCOPY    . COLONOSCOPY W/ POLYPECTOMY    . ESOPHAGOGASTRODUODENOSCOPY    . Left middle cerebral artery angioplasty  2004   by [name] had 2 follow up occurances  . MASTECTOMY Right   . MASTECTOMY W/ SENTINEL NODE BIOPSY Right 04/23/2013   Procedure: RIGHT TOTAL MASTECTOMY WITH SENTINEL LYMPH NODE BIOPSY;  Surgeon: [name], MD;  Location: [facility];  Service: General;  Laterality: Right;  . PORTACATH PLACEMENT N/A 11/05/2012   Procedure: INSERTION PORT-A-CATH;  Surgeon: [name], MD;  Location: [facility];  Service: General;  Laterality: N/A;  . RADIOLOGY WITH ANESTHESIA N/A 05/04/2015   Procedure: MRI BRAIN WITH AND WITHOUT;  Surgeon: Medication  Radiologist, MD;  Location: [facility];  Service: Radiology;  Laterality: N/A;    Family History  Problem Relation Age of Onset  . Lung cancer Mother  70  . Heart disease Father   . Colon cancer Brother 70  . Alcohol abuse Brother   . Colon cancer Maternal Uncle        dx in his 46s  . Colon cancer Maternal Aunt   . Ovarian cancer Other 60  . Lung cancer Maternal Aunt   . Breast cancer Sister     SOCIAL HX:   reports that she quit smoking about 32 years ago. Her smoking use included cigarettes. She has a 20.00 pack-year smoking history. She has never used smokeless tobacco. She reports that she does not drink alcohol or use drugs.   Current Outpatient Medications:  .  aspirin EC 325 MG tablet, Take 325 mg by mouth every morning. , Disp: , Rfl:  .  Calcium Carbonate (CALTRATE 600 PO), Take by mouth 2 (two) times daily., Disp: , Rfl:  .  cetirizine (ZYRTEC) 10 MG tablet, Take by mouth., Disp: , Rfl:  .  clopidogrel (PLAVIX) 75 MG tablet, TAKE 1 TABLET(75 MG) BY MOUTH DAILY, Disp: 30 tablet, Rfl: 6 .  levothyroxine (SYNTHROID, LEVOTHROID) 125 MCG tablet, Take 1 tablet (125 mcg total) by mouth daily., Disp: 90 tablet, Rfl: 3 .  lisinopril (PRINIVIL,ZESTRIL) 20 MG tablet, TAKE 1 TABLET BY MOUTH EVERY DAY. NEED TO GET NEW DOCTOR TO FILL REFILLS, Disp: 90 tablet, Rfl: 0 .  metoprolol succinate (TOPROL-XL) 100 MG 24 hr tablet, TAKE 1 TABLET BY MOUTH EVERY DAY WITH OR IMMEDIATELY FOLLOWING A MEAL, Disp: 30 tablet, Rfl: 6 .  Multiple Vitamin (MULTIVITAMIN WITH MINERALS) TABS tablet, Take 1 tablet by mouth every morning. Centrum Silver, Disp: , Rfl:  .  rosuvastatin (CRESTOR) 20 MG tablet, TAKE 1 TABLET BY MOUTH DAILY, Disp: 90 tablet, Rfl: 2 .  sertraline (ZOLOFT) 100 MG tablet, Take 1 tablet (100 mg total) by mouth daily., Disp: 30 tablet, Rfl: 6  EXAM:   VITALS per patient if applicable: none reported  GENERAL: alert, oriented, appears well and in no acute distress  HEENT: atraumatic, conjunttiva clear, no obvious abnormalities on inspection of external nose and ears  NECK: normal movements of the head and neck  LUNGS: on inspection  no signs of respiratory distress, breathing rate appears normal, no obvious gross increased work of breathing, gasping or wheezing  CV: no obvious cyanosis  MS: moves all visible extremities without noticeable abnormality  PSYCH/NEURO: pleasant and cooperative, no obvious depression or anxiety, speech and thought processing grossly intact  ASSESSMENT AND PLAN:   Hypothyroidism, unspecified type -TSH in normal range with increase in synthroid dose.  HYPERCHOLESTEROLEMIA -Continue statin  Essential hypertension -Well controlled at last office visit.     I discussed the assessment and treatment plan with the patient. The patient was provided an opportunity to ask questions and all were answered. The patient agreed with the plan and demonstrated an understanding of the instructions.   The patient was advised to call back or seek an in-person evaluation if the symptoms worsen or if the condition fails to improve as anticipated.    Lelon Frohlich, MD  [facility] at [facility]

## 2018-12-02 ENCOUNTER — Telehealth: Payer: Self-pay | Admitting: *Deleted

## 2018-12-02 NOTE — Telephone Encounter (Signed)
Left message on machine for patient to schedule a 6 month follow up CRM

## 2019-01-28 ENCOUNTER — Other Ambulatory Visit: Payer: Self-pay | Admitting: Primary Care

## 2019-01-29 ENCOUNTER — Other Ambulatory Visit: Payer: Self-pay | Admitting: Internal Medicine

## 2019-02-11 ENCOUNTER — Telehealth: Payer: Self-pay | Admitting: Internal Medicine

## 2019-02-11 NOTE — Telephone Encounter (Signed)
Patient states she feels like she possibly isn't agreeing with the Synthroid increase.  Also, she has a lump on her elbow that she wants looked at so she needs a call to see if she can schedule an in office visit.

## 2019-02-11 NOTE — Telephone Encounter (Signed)
Patient returned the call and an appt was scheduled for 7/1 at 3pm.

## 2019-02-11 NOTE — Telephone Encounter (Signed)
Left message on machine for patient to return our call to schedule an office visit.

## 2019-02-13 ENCOUNTER — Other Ambulatory Visit: Payer: Self-pay

## 2019-02-13 ENCOUNTER — Ambulatory Visit (INDEPENDENT_AMBULATORY_CARE_PROVIDER_SITE_OTHER): Payer: Medicare Other | Admitting: Internal Medicine

## 2019-02-13 DIAGNOSIS — E039 Hypothyroidism, unspecified: Secondary | ICD-10-CM | POA: Diagnosis not present

## 2019-02-13 NOTE — Addendum Note (Signed)
Addended by: Elmer Picker on: 02/13/2019 03:22 PM   Modules accepted: Orders

## 2019-02-13 NOTE — Progress Notes (Addendum)
Virtual Visit via Telephone Note  I connected with Debra Mcintyre on 02/13/19 at  2:30 PM EDT by telephone and verified that I am speaking with the correct person using two identifiers.   I discussed the limitations, risks, security and privacy concerns of performing an evaluation and management service by telephone and the availability of in person appointments. I also discussed with the patient that there may be a patient responsible charge related to this service. The patient expressed understanding and agreed to proceed.  Location patient: home Location provider: work office Participants present for the call: patient, provider Patient did not have a visit in the prior 7 days to address this/these issue(s).   History of Present Illness:  Debra Mcintyre has scheduled this visit as Debra Mcintyre would like her thyroid levels checked. In 12/19 her TSH was 5.3 so we increased her synthroid dose from 112 to 125 mcg. Her TSH had subsequently normalized. Debra Mcintyre states Debra Mcintyre has become more "on edge", irritable and with less patience. Debra Mcintyre states last time this happened her thyroid levels were off.   Observations/Objective: Patient sounds cheerful and well on the phone. I do not appreciate any increased work of breathing. Speech and thought processing are grossly intact. Patient reported vitals: none reported   Current Outpatient Medications:  .  aspirin EC 325 MG tablet, Take 325 mg by mouth every morning. , Disp: , Rfl:  .  Calcium Carbonate (CALTRATE 600 PO), Take by mouth 2 (two) times daily., Disp: , Rfl:  .  cetirizine (ZYRTEC) 10 MG tablet, Take by mouth., Disp: , Rfl:  .  clopidogrel (PLAVIX) 75 MG tablet, TAKE 1 TABLET(75 MG) BY MOUTH DAILY, Disp: 30 tablet, Rfl: 6 .  levothyroxine (SYNTHROID, LEVOTHROID) 125 MCG tablet, Take 1 tablet (125 mcg total) by mouth daily., Disp: 90 tablet, Rfl: 3 .  lisinopril (ZESTRIL) 20 MG tablet, TAKE 1 TABLET BY MOUTH EVERY DAY, Disp: 90 tablet, Rfl: 1 .   metoprolol succinate (TOPROL-XL) 100 MG 24 hr tablet, TAKE 1 TABLET BY MOUTH EVERY DAY WITH OR IMMEDIATELY FOLLOWING A MEAL, Disp: 30 tablet, Rfl: 6 .  Multiple Vitamin (MULTIVITAMIN WITH MINERALS) TABS tablet, Take 1 tablet by mouth every morning. Centrum Silver, Disp: , Rfl:  .  rosuvastatin (CRESTOR) 20 MG tablet, TAKE 1 TABLET BY MOUTH DAILY, Disp: 90 tablet, Rfl: 2 .  sertraline (ZOLOFT) 100 MG tablet, Take 1 tablet (100 mg total) by mouth daily., Disp: 30 tablet, Rfl: 6  Review of Systems:  Constitutional: Denies fever, chills, diaphoresis, appetite change and fatigue.  HEENT: Denies photophobia, eye pain, redness, hearing loss, ear pain, congestion, sore throat, rhinorrhea, sneezing, mouth sores, trouble swallowing, neck pain, neck stiffness and tinnitus.   Respiratory: Denies SOB, DOE, cough, chest tightness,  and wheezing.   Cardiovascular: Denies chest pain, palpitations and leg swelling.  Gastrointestinal: Denies nausea, vomiting, abdominal pain, diarrhea, constipation, blood in stool and abdominal distention.  Genitourinary: Denies dysuria, urgency, frequency, hematuria, flank pain and difficulty urinating.  Endocrine: Denies: hot or cold intolerance, sweats, changes in hair or nails, polyuria, polydipsia. Musculoskeletal: Denies myalgias, back pain, joint swelling, arthralgias and gait problem.  Skin: Denies pallor, rash and wound.  Neurological: Denies dizziness, seizures, syncope, weakness, light-headedness, numbness and headaches.  Hematological: Denies adenopathy. Easy bruising, personal or family bleeding history  Psychiatric/Behavioral: Denies suicidal ideation, , confusion, nervousness, sleep disturbance and agitation, positive mood changes as described above   Assessment and Plan:  Hypothyroidism, unspecified type - Plan: TSH  -  Debra Mcintyre will come in today for a TSH. -Further plan pending results.  I discussed the assessment and treatment plan with the patient. The patient  was provided an opportunity to ask questions and all were answered. The patient agreed with the plan and demonstrated an understanding of the instructions.   The patient was advised to call back or seek an in-person evaluation if the symptoms worsen or if the condition fails to improve as anticipated.  I provided 11 minutes of non-face-to-face time during this encounter.   Lelon Frohlich, MD [facility] at [facility]

## 2019-02-13 NOTE — Addendum Note (Signed)
Addended by: Lelon Frohlich Y on: 02/13/2019 03:11 PM   Modules accepted: Orders, Level of Service

## 2019-02-13 NOTE — Addendum Note (Signed)
Addended by: Elmer Picker on: 02/13/2019 03:23 PM   Modules accepted: Orders

## 2019-02-19 ENCOUNTER — Telehealth: Payer: Self-pay | Admitting: *Deleted

## 2019-02-19 LAB — TSH: TSH: 1.01 mIU/L (ref 0.40–4.50)

## 2019-02-19 NOTE — Telephone Encounter (Signed)
Copied from [place] [id number]. Topic: General - Inquiry >> Feb 19, 2019  1:07 PM Virl Axe D wrote: Reason for CRM: Pt inquired about recent lab results. Please advise.

## 2019-02-25 ENCOUNTER — Other Ambulatory Visit: Payer: Self-pay

## 2019-02-25 ENCOUNTER — Encounter: Payer: Self-pay | Admitting: Internal Medicine

## 2019-02-25 ENCOUNTER — Ambulatory Visit (INDEPENDENT_AMBULATORY_CARE_PROVIDER_SITE_OTHER): Payer: Medicare Other | Admitting: Internal Medicine

## 2019-02-25 VITALS — BP 120/70 | HR 66 | Temp 97.9°F | Wt 165.6 lb

## 2019-02-25 DIAGNOSIS — G452 Multiple and bilateral precerebral artery syndromes: Secondary | ICD-10-CM | POA: Diagnosis not present

## 2019-02-25 DIAGNOSIS — M7022 Olecranon bursitis, left elbow: Secondary | ICD-10-CM | POA: Diagnosis not present

## 2019-02-25 DIAGNOSIS — B079 Viral wart, unspecified: Secondary | ICD-10-CM | POA: Diagnosis not present

## 2019-02-25 DIAGNOSIS — F411 Generalized anxiety disorder: Secondary | ICD-10-CM | POA: Diagnosis not present

## 2019-02-25 NOTE — Progress Notes (Signed)
Established Patient Office Visit     CC/Reason for Visit: Discuss several acute issues  HPI: Debra Mcintyre is a 78 y.o. female who is coming in today for the above mentioned reasons.  She has a few acute issues she would like to discuss today:  1.  She has noticed increased anxiety recently.  She is prescribed Zoloft 100 mg daily but turns out she is only taking 50 mg.  She thought her thyroid levels were off but they were recently checked and were normal.  2.  She has what she thinks is a burn on her left middle finger; this has been there for about 2 months.  She has neuropathy so she does not recall the exact injury.  3.  She would like me to evaluate a lump of her left elbow that has been present for a few months, is not really painful unless she is putting pressure on her elbow.   Past Medical/Surgical History: Past Medical History:  Diagnosis Date  . Breast cancer ([place]) 09/2012   right breast/right axillary lymph node t2,pn1, stage 11b, invasive ductal carcinma, grade 3, triple negative, with an mib-1 of 15%  . Cerebral aneurysm, nonruptured   . Cerebrovascular disease, unspecified   . Colonic polyp   . Dizziness    pt. states that she experiences this occassionally 05/03/15  . Headache    occassionally  . Heart murmur   . History of chemotherapy   . Hx of radiation therapy 05/27/13-08/13/13   right chest wall/right supraclavicular/axillary region 5220 cGy 29 sessions, right mastectomy/chest wall boost cGy 5 sessions  . Hypertension    Does not see a cardiologist  . Hypothyroidism   . Neuropathy   . Pure hypercholesterolemia   . Rosacea   . Seasonal allergies   . TIA (transient ischemic attack)   . Wears glasses     Past Surgical History:  Procedure Laterality Date  . BREAST BIOPSY Right 10/23/2012  . BREAST BIOPSY Right 10/08/2012  . COLONOSCOPY    . COLONOSCOPY W/ POLYPECTOMY    . ESOPHAGOGASTRODUODENOSCOPY    . Left middle cerebral artery  angioplasty  2004   by [name] had 2 follow up occurances  . MASTECTOMY Right   . MASTECTOMY W/ SENTINEL NODE BIOPSY Right 04/23/2013   Procedure: RIGHT TOTAL MASTECTOMY WITH SENTINEL LYMPH NODE BIOPSY;  Surgeon: [name], MD;  Location: [facility];  Service: General;  Laterality: Right;  . PORTACATH PLACEMENT N/A 11/05/2012   Procedure: INSERTION PORT-A-CATH;  Surgeon: [name], MD;  Location: [facility];  Service: General;  Laterality: N/A;  . RADIOLOGY WITH ANESTHESIA N/A 05/04/2015   Procedure: MRI BRAIN WITH AND WITHOUT;  Surgeon: Medication Radiologist, MD;  Location: [facility];  Service: Radiology;  Laterality: N/A;    Social History:  reports that she quit smoking about 32 years ago. Her smoking use included cigarettes. She has a 20.00 pack-year smoking history. She has never used smokeless tobacco. She reports that she does not drink alcohol or use drugs.  Allergies: No Known Allergies  Family History:  Family History  Problem Relation Age of Onset  . Lung cancer Mother 29  . Heart disease Father   . Colon cancer Brother 42  . Alcohol abuse Brother   . Colon cancer Maternal Uncle        dx in his 29s  . Colon cancer Maternal Aunt   . Ovarian cancer Other 75  . Lung cancer Maternal Aunt   .  Breast cancer Sister      Current Outpatient Medications:  .  aspirin EC 325 MG tablet, Take 325 mg by mouth every morning. , Disp: , Rfl:  .  Calcium Carbonate (CALTRATE 600 PO), Take by mouth 2 (two) times daily., Disp: , Rfl:  .  cetirizine (ZYRTEC) 10 MG tablet, Take by mouth., Disp: , Rfl:  .  clopidogrel (PLAVIX) 75 MG tablet, TAKE 1 TABLET(75 MG) BY MOUTH DAILY, Disp: 30 tablet, Rfl: 6 .  levothyroxine (SYNTHROID, LEVOTHROID) 125 MCG tablet, Take 1 tablet (125 mcg total) by mouth daily., Disp: 90 tablet, Rfl: 3 .  lisinopril (ZESTRIL) 20 MG tablet, TAKE 1 TABLET BY MOUTH EVERY DAY, Disp: 90 tablet, Rfl: 1 .  metoprolol succinate (TOPROL-XL) 100 MG 24  hr tablet, TAKE 1 TABLET BY MOUTH EVERY DAY WITH OR IMMEDIATELY FOLLOWING A MEAL, Disp: 30 tablet, Rfl: 6 .  Multiple Vitamin (MULTIVITAMIN WITH MINERALS) TABS tablet, Take 1 tablet by mouth every morning. Centrum Silver, Disp: , Rfl:  .  rosuvastatin (CRESTOR) 20 MG tablet, TAKE 1 TABLET BY MOUTH DAILY, Disp: 90 tablet, Rfl: 2 .  sertraline (ZOLOFT) 100 MG tablet, Take 1 tablet (100 mg total) by mouth daily., Disp: 30 tablet, Rfl: 6  Review of Systems:  Constitutional: Denies fever, chills, diaphoresis, appetite change and fatigue.  HEENT: Denies photophobia, eye pain, redness, hearing loss, ear pain, congestion, sore throat, rhinorrhea, sneezing, mouth sores, trouble swallowing, neck pain, neck stiffness and tinnitus.   Respiratory: Denies SOB, DOE, cough, chest tightness,  and wheezing.   Cardiovascular: Denies chest pain, palpitations and leg swelling.  Gastrointestinal: Denies nausea, vomiting, abdominal pain, diarrhea, constipation, blood in stool and abdominal distention.  Genitourinary: Denies dysuria, urgency, frequency, hematuria, flank pain and difficulty urinating.  Endocrine: Denies: hot or cold intolerance, sweats, changes in hair or nails, polyuria, polydipsia. Musculoskeletal: Denies myalgias, back pain, joint swelling, arthralgias and gait problem.  Skin: Denies pallor, rash and wound.  Neurological: Denies dizziness, seizures, syncope, weakness, light-headedness, numbness and headaches.  Hematological: Denies adenopathy. Easy bruising, personal or family bleeding history  Psychiatric/Behavioral: Denies suicidal ideation,  confusion,  sleep disturbance and agitation    Physical Exam: Vitals:   02/25/19 1446  BP: 120/70  Pulse: 66  Temp: 97.9 F (36.6 C)  TempSrc: Temporal  SpO2: 97%  Weight: 165 lb 9.6 oz (75.1 kg)    Body mass index is 25.99 kg/m.   Constitutional: NAD, calm, comfortable Eyes: PERRL, lids and conjunctivae normal ENMT: Mucous membranes are  moist.  Respiratory: clear to auscultation bilaterally Cardiovascular: Regular rate and rhythm, no murmurs / rubs / gallops. Abdomen: no tenderness, no masses palpated. No hepatosplenomegaly. Bowel sounds positive.  Musculoskeletal: no clubbing / cyanosis. No joint deformity upper and lower extremities. Good ROM, no contractures. Normal muscle tone.  Soft, circular protuberance over her left elbow, no erythema or significant pain to palpation. Skin: no rashes, lesions, ulcers. No induration Neurologic: Grossly intact and nonfocal  psychiatric: Normal judgment and insight. Alert and oriented x 3. Normal mood.    Impression and Plan:  Anxiety state -I suspect increased anxiety may be related to being cooped up at home with the COVID-19 pandemic. -She will increase her Zoloft from 50 to 100 mg as was originally prescribed. -I will follow-up with her in 4 to 6 weeks, if no improvement may consider referral for CBT.  Viral wart on finger -This does not appear to be a burn I think this is a wart. -Have advised Compound  W wrapped in Saran wrap to change daily. -She will return if no improvement and we can consider cryotherapy.  Olecranon bursitis, left elbow -Does not appear to be infected, is not causing significant issues.  Observation for now.     Lelon Frohlich, MD [facility] at [facility]

## 2019-03-05 ENCOUNTER — Ambulatory Visit: Payer: Medicare Other | Admitting: Internal Medicine

## 2019-03-23 ENCOUNTER — Other Ambulatory Visit: Payer: Self-pay | Admitting: Pulmonary Disease

## 2019-04-04 ENCOUNTER — Other Ambulatory Visit: Payer: Self-pay | Admitting: Pulmonary Disease

## 2019-04-04 ENCOUNTER — Other Ambulatory Visit: Payer: Self-pay | Admitting: Internal Medicine

## 2019-04-29 ENCOUNTER — Ambulatory Visit (INDEPENDENT_AMBULATORY_CARE_PROVIDER_SITE_OTHER): Payer: Medicare Other | Admitting: *Deleted

## 2019-04-29 DIAGNOSIS — Z23 Encounter for immunization: Secondary | ICD-10-CM

## 2019-04-29 NOTE — Progress Notes (Signed)
Patient in clinic for flu vaccine. Flu vaccine administered with no reactions

## 2019-05-05 DIAGNOSIS — M25562 Pain in left knee: Secondary | ICD-10-CM | POA: Diagnosis not present

## 2019-05-05 DIAGNOSIS — M545 Low back pain: Secondary | ICD-10-CM | POA: Diagnosis not present

## 2019-06-04 ENCOUNTER — Telehealth: Payer: Self-pay | Admitting: *Deleted

## 2019-06-04 MED ORDER — SERTRALINE HCL 100 MG PO TABS: 100.0000 mg | ORAL_TABLET | Freq: Every day | ORAL | 1 refills | Status: DC

## 2019-06-04 NOTE — Telephone Encounter (Signed)
Refill sent.

## 2019-06-04 NOTE — Telephone Encounter (Signed)
Yes, ok to refill zoloft

## 2019-06-04 NOTE — Telephone Encounter (Signed)
Refill Request [facility]  Last refill:  07/18/18 - Dr [name] Last office visit:  02/25/19  Okay to refill?

## 2019-06-16 DIAGNOSIS — M25522 Pain in left elbow: Secondary | ICD-10-CM | POA: Diagnosis not present

## 2019-06-19 DIAGNOSIS — M545 Low back pain: Secondary | ICD-10-CM | POA: Diagnosis not present

## 2019-06-23 DIAGNOSIS — M545 Low back pain: Secondary | ICD-10-CM | POA: Diagnosis not present

## 2019-07-18 ENCOUNTER — Other Ambulatory Visit: Payer: Self-pay | Admitting: Oncology

## 2019-07-18 DIAGNOSIS — Z1231 Encounter for screening mammogram for malignant neoplasm of breast: Secondary | ICD-10-CM

## 2019-07-21 ENCOUNTER — Other Ambulatory Visit: Payer: Self-pay | Admitting: Pulmonary Disease

## 2019-07-22 ENCOUNTER — Telehealth: Payer: Self-pay | Admitting: Internal Medicine

## 2019-07-22 NOTE — Telephone Encounter (Signed)
Patient medication was refused . Please advise   Medication  metoprolol succinate (TOPROL-XL) 100 MG 24 hr tablet [[id number]]       [id number] [address], [place] AT [facility] [address]  [place] [address]  Phone: [phone] Fax: [phone]    Call back [phone]

## 2019-07-23 DIAGNOSIS — M545 Low back pain: Secondary | ICD-10-CM | POA: Diagnosis not present

## 2019-07-23 DIAGNOSIS — M4120 Other idiopathic scoliosis, site unspecified: Secondary | ICD-10-CM | POA: Diagnosis not present

## 2019-07-23 DIAGNOSIS — M5416 Radiculopathy, lumbar region: Secondary | ICD-10-CM | POA: Diagnosis not present

## 2019-07-24 ENCOUNTER — Other Ambulatory Visit: Payer: Self-pay | Admitting: Internal Medicine

## 2019-07-24 DIAGNOSIS — I10 Essential (primary) hypertension: Secondary | ICD-10-CM

## 2019-07-24 MED ORDER — METOPROLOL SUCCINATE ER 100 MG PO TB24: ORAL_TABLET | ORAL | 1 refills | Status: DC

## 2019-07-24 NOTE — Telephone Encounter (Signed)
Rx sent 

## 2019-07-24 NOTE — Telephone Encounter (Signed)
Spoke with the pt and informed her of the message below.   

## 2019-07-27 ENCOUNTER — Other Ambulatory Visit: Payer: Self-pay | Admitting: Internal Medicine

## 2019-07-29 ENCOUNTER — Other Ambulatory Visit: Payer: Self-pay | Admitting: Internal Medicine

## 2019-07-29 ENCOUNTER — Other Ambulatory Visit: Payer: Self-pay

## 2019-07-29 ENCOUNTER — Ambulatory Visit
Admission: RE | Admit: 2019-07-29 | Discharge: 2019-07-29 | Disposition: A | Discharge status: Home / Self Care | Payer: Medicare Other | Source: Ambulatory Visit | Attending: Oncology | Admitting: Oncology

## 2019-07-29 DIAGNOSIS — Z1231 Encounter for screening mammogram for malignant neoplasm of breast: Secondary | ICD-10-CM

## 2019-07-31 ENCOUNTER — Other Ambulatory Visit: Payer: Self-pay | Admitting: Internal Medicine

## 2019-07-31 DIAGNOSIS — R928 Other abnormal and inconclusive findings on diagnostic imaging of breast: Secondary | ICD-10-CM

## 2019-08-06 ENCOUNTER — Other Ambulatory Visit: Payer: Self-pay

## 2019-08-06 ENCOUNTER — Other Ambulatory Visit: Payer: Self-pay | Admitting: Oncology

## 2019-08-06 ENCOUNTER — Other Ambulatory Visit: Payer: Self-pay | Admitting: Internal Medicine

## 2019-08-06 ENCOUNTER — Ambulatory Visit
Admission: RE | Admit: 2019-08-06 | Discharge: 2019-08-06 | Disposition: A | Discharge status: Home / Self Care | Payer: Medicare Other | Source: Ambulatory Visit | Attending: Internal Medicine | Admitting: Internal Medicine

## 2019-08-06 DIAGNOSIS — R928 Other abnormal and inconclusive findings on diagnostic imaging of breast: Secondary | ICD-10-CM

## 2019-08-06 DIAGNOSIS — R599 Enlarged lymph nodes, unspecified: Secondary | ICD-10-CM

## 2019-08-13 ENCOUNTER — Other Ambulatory Visit: Payer: Self-pay | Admitting: Internal Medicine

## 2019-08-13 ENCOUNTER — Ambulatory Visit
Admission: RE | Admit: 2019-08-13 | Discharge: 2019-08-13 | Disposition: A | Discharge status: Home / Self Care | Payer: Medicare Other | Source: Ambulatory Visit | Attending: Internal Medicine | Admitting: Internal Medicine

## 2019-08-13 ENCOUNTER — Other Ambulatory Visit (HOSPITAL_COMMUNITY)
Admission: RE | Admit: 2019-08-13 | Discharge: 2019-08-13 | Disposition: A | Discharge status: Home / Self Care | Payer: Medicare Other | Source: Ambulatory Visit | Attending: Internal Medicine | Admitting: Internal Medicine

## 2019-08-13 ENCOUNTER — Other Ambulatory Visit: Payer: Self-pay

## 2019-08-13 DIAGNOSIS — Z853 Personal history of malignant neoplasm of breast: Secondary | ICD-10-CM | POA: Diagnosis not present

## 2019-08-13 DIAGNOSIS — R599 Enlarged lymph nodes, unspecified: Secondary | ICD-10-CM

## 2019-08-18 ENCOUNTER — Encounter: Payer: Self-pay | Admitting: Oncology

## 2019-08-18 LAB — SURGICAL PATHOLOGY

## 2019-09-02 ENCOUNTER — Encounter: Payer: Self-pay | Admitting: Internal Medicine

## 2019-09-02 ENCOUNTER — Other Ambulatory Visit: Payer: Self-pay

## 2019-09-02 ENCOUNTER — Ambulatory Visit (INDEPENDENT_AMBULATORY_CARE_PROVIDER_SITE_OTHER): Payer: Medicare Other | Admitting: Internal Medicine

## 2019-09-02 VITALS — BP 118/80 | Temp 97.3°F | Ht 67.0 in | Wt 168.0 lb

## 2019-09-02 DIAGNOSIS — I671 Cerebral aneurysm, nonruptured: Secondary | ICD-10-CM | POA: Diagnosis not present

## 2019-09-02 DIAGNOSIS — I10 Essential (primary) hypertension: Secondary | ICD-10-CM | POA: Diagnosis not present

## 2019-09-02 DIAGNOSIS — K219 Gastro-esophageal reflux disease without esophagitis: Secondary | ICD-10-CM

## 2019-09-02 DIAGNOSIS — F411 Generalized anxiety disorder: Secondary | ICD-10-CM

## 2019-09-02 DIAGNOSIS — E538 Deficiency of other specified B group vitamins: Secondary | ICD-10-CM

## 2019-09-02 DIAGNOSIS — E559 Vitamin D deficiency, unspecified: Secondary | ICD-10-CM | POA: Diagnosis not present

## 2019-09-02 DIAGNOSIS — E039 Hypothyroidism, unspecified: Secondary | ICD-10-CM | POA: Diagnosis not present

## 2019-09-02 LAB — COMPREHENSIVE METABOLIC PANEL
ALT: 17 U/L (ref 0–35)
AST: 21 U/L (ref 0–37)
Albumin: 4.5 g/dL (ref 3.5–5.2)
Alkaline Phosphatase: 45 U/L (ref 39–117)
BUN: 15 mg/dL (ref 6–23)
CO2: 28 mEq/L (ref 19–32)
Calcium: 10 mg/dL (ref 8.4–10.5)
Chloride: 102 mEq/L (ref 96–112)
Creatinine, Ser: 0.77 mg/dL (ref 0.40–1.20)
GFR: 72.69 mL/min (ref >60.00)
Glucose, Bld: 97 mg/dL (ref 70–99)
Potassium: 4.9 mEq/L (ref 3.5–5.1)
Sodium: 138 mEq/L (ref 135–145)
Total Bilirubin: 0.8 mg/dL (ref 0.2–1.2)
Total Protein: 7.2 g/dL (ref 6.0–8.3)

## 2019-09-02 LAB — VITAMIN B12: Vitamin B-12: 750 pg/mL (ref 211–911)

## 2019-09-02 LAB — LIPID PANEL
Cholesterol: 185 mg/dL (ref 0–200)
HDL: 45.6 mg/dL (ref >39.00)
NonHDL: 139.19
Total CHOL/HDL Ratio: 4
Triglycerides: 265 mg/dL — ABNORMAL HIGH (ref 0.0–149.0)
VLDL: 53 mg/dL — ABNORMAL HIGH (ref 0.0–40.0)

## 2019-09-02 LAB — VITAMIN D 25 HYDROXY (VIT D DEFICIENCY, FRACTURES): VITD: 53.48 ng/mL (ref 30.00–100.00)

## 2019-09-02 LAB — TSH: TSH: 5.59 u[IU]/mL — ABNORMAL HIGH (ref 0.35–4.50)

## 2019-09-02 LAB — LDL CHOLESTEROL, DIRECT: Direct LDL: 105 mg/dL

## 2019-09-02 NOTE — Progress Notes (Signed)
Established Patient Office Visit     This visit occurred during the SARS-CoV-2 public health emergency.  Safety protocols were in place, including screening questions prior to the visit, additional usage of staff PPE, and extensive cleaning of exam room while observing appropriate contact time as indicated for disinfecting solutions.    CC/Reason for Visit: 64-month follow-up  HPI: Debra Mcintyre is a 79 y.o. female who is coming in today for the above mentioned reasons. Past Medical History is significant for: Well-controlled hypertension, hyperlipidemia on statin, hypothyroidism and generalized anxiety disorder on 100 mg of Zoloft.  She has been doing well.  Since I last saw her she was noted to have a possible left axillary mass during her mammogram which was subsequently biopsied and thankfully negative for malignancy.  She is due for Tdap and shingles vaccination series.  She has no acute complaints.   Past Medical/Surgical History: Past Medical History:  Diagnosis Date  . Breast cancer ([place]) 09/2012   right breast/right axillary lymph node t2,pn1, stage 11b, invasive ductal carcinma, grade 3, triple negative, with an mib-1 of 15%  . Cerebral aneurysm, nonruptured   . Cerebrovascular disease, unspecified   . Colonic polyp   . Dizziness    pt. states that she experiences this occassionally 05/03/15  . Headache    occassionally  . Heart murmur   . History of chemotherapy   . Hx of radiation therapy 05/27/13-08/13/13   right chest wall/right supraclavicular/axillary region 5220 cGy 29 sessions, right mastectomy/chest wall boost cGy 5 sessions  . Hypertension    Does not see a cardiologist  . Hypothyroidism   . Neuropathy   . Pure hypercholesterolemia   . Rosacea   . Seasonal allergies   . TIA (transient ischemic attack)   . Wears glasses     Past Surgical History:  Procedure Laterality Date  . BREAST BIOPSY Right 10/23/2012  . BREAST BIOPSY Right 10/08/2012  .  COLONOSCOPY    . COLONOSCOPY W/ POLYPECTOMY    . ESOPHAGOGASTRODUODENOSCOPY    . Left middle cerebral artery angioplasty  2004   by [name] had 2 follow up occurances  . MASTECTOMY Right   . MASTECTOMY W/ SENTINEL NODE BIOPSY Right 04/23/2013   Procedure: RIGHT TOTAL MASTECTOMY WITH SENTINEL LYMPH NODE BIOPSY;  Surgeon: [name], MD;  Location: [facility];  Service: General;  Laterality: Right;  . PORTACATH PLACEMENT N/A 11/05/2012   Procedure: INSERTION PORT-A-CATH;  Surgeon: [name], MD;  Location: [place];  Service: General;  Laterality: N/A;  . RADIOLOGY WITH ANESTHESIA N/A 05/04/2015   Procedure: MRI BRAIN WITH AND WITHOUT;  Surgeon: Medication Radiologist, MD;  Location: [facility];  Service: Radiology;  Laterality: N/A;    Social History:  reports that she quit smoking about 33 years ago. Her smoking use included cigarettes. She has a 20.00 pack-year smoking history. She has never used smokeless tobacco. She reports that she does not drink alcohol or use drugs.  Allergies: No Known Allergies  Family History:  Family History  Problem Relation Age of Onset  . Lung cancer Mother 93  . Heart disease Father   . Colon cancer Brother 31  . Alcohol abuse Brother   . Colon cancer Maternal Uncle        dx in his 58s  . Colon cancer Maternal Aunt   . Ovarian cancer Other 77  . Lung cancer Maternal Aunt   . Breast cancer Sister      Current  Outpatient Medications:  .  aspirin EC 325 MG tablet, Take 325 mg by mouth every morning. , Disp: , Rfl:  .  Calcium Carbonate (CALTRATE 600 PO), Take by mouth 2 (two) times daily., Disp: , Rfl:  .  clopidogrel (PLAVIX) 75 MG tablet, TAKE 1 TABLET BY MOUTH DAILY, Disp: 90 tablet, Rfl: 1 .  levothyroxine (SYNTHROID, LEVOTHROID) 125 MCG tablet, Take 1 tablet (125 mcg total) by mouth daily., Disp: 90 tablet, Rfl: 3 .  lisinopril (ZESTRIL) 20 MG tablet, TAKE 1 TABLET BY MOUTH EVERY DAY, Disp: 90 tablet, Rfl: 0 .   metoprolol succinate (TOPROL-XL) 100 MG 24 hr tablet, TAKE 1 TABLET BY MOUTH EVERY DAY WITH OR IMMEDIATELY FOLLOWING A MEAL, Disp: 90 tablet, Rfl: 1 .  Multiple Vitamin (MULTIVITAMIN WITH MINERALS) TABS tablet, Take 1 tablet by mouth every morning. Centrum Silver, Disp: , Rfl:  .  rosuvastatin (CRESTOR) 20 MG tablet, TAKE 1 TABLET BY MOUTH DAILY, Disp: 90 tablet, Rfl: 1 .  sertraline (ZOLOFT) 100 MG tablet, Take 1 tablet (100 mg total) by mouth daily., Disp: 90 tablet, Rfl: 1  Review of Systems:  Constitutional: Denies fever, chills, diaphoresis, appetite change and fatigue.  HEENT: Denies photophobia, eye pain, redness, hearing loss, ear pain, congestion, sore throat, rhinorrhea, sneezing, mouth sores, trouble swallowing, neck pain, neck stiffness and tinnitus.   Respiratory: Denies SOB, DOE, cough, chest tightness,  and wheezing.   Cardiovascular: Denies chest pain, palpitations and leg swelling.  Gastrointestinal: Denies nausea, vomiting, abdominal pain, diarrhea, constipation, blood in stool and abdominal distention.  Genitourinary: Denies dysuria, urgency, frequency, hematuria, flank pain and difficulty urinating.  Endocrine: Denies: hot or cold intolerance, sweats, changes in hair or nails, polyuria, polydipsia. Musculoskeletal: Denies myalgias, back pain, joint swelling, arthralgias and gait problem.  Skin: Denies pallor, rash and wound.  Neurological: Denies dizziness, seizures, syncope, weakness, light-headedness, numbness and headaches.  Hematological: Denies adenopathy. Easy bruising, personal or family bleeding history  Psychiatric/Behavioral: Denies suicidal ideation, mood changes, confusion, nervousness, sleep disturbance and agitation    Physical Exam: Vitals:   09/02/19 1041  BP: 118/80  Temp: (!) 97.3 F (36.3 C)  TempSrc: Temporal  Weight: 168 lb (76.2 kg)  Height: 5\' 7"  (1.702 m)    Body mass index is 26.31 kg/m.   Constitutional: NAD, calm, comfortable Eyes:  PERRL, lids and conjunctivae normal ENMT: Mucous membranes are moist.  Respiratory: clear to auscultation bilaterally, no wheezing, no crackles. Normal respiratory effort. No accessory muscle use.  Cardiovascular: Regular rate and rhythm, no murmurs / rubs / gallops. No extremity edema.  Neurologic: Grossly intact and nonfocal Psychiatric: Normal judgment and insight. Alert and oriented x 3. Normal mood.    Impression and Plan:  Essential hypertension  -Well-controlled, continue current medications.  Hypothyroidism, unspecified type  - Plan: TSH -Continue current Synthroid dose, last TSH was 1.010 in July 2020.  Anxiety state -Well-controlled on  Zoloft.    Office Visit from 09/02/2019 in [facility] at [place]  PHQ-9 Total Score  0       Gastroesophageal reflux disease without esophagitis -Well-controlled, not currently on PPI therapy.  INTRACRANIAL ANEURYSM -Has been released from urology care, on aspirin and Plavix.   Patient Instructions  -Nice seeing you today!!  -Lab work today; will notify you once results are available.  -Remember your tetanus and shingles vaccines at the drugstore.  -Schedule follow up in 6 months.       Lelon Frohlich, MD [facility] at [facility]

## 2019-09-02 NOTE — Addendum Note (Signed)
Addended by: Suzette Battiest on: 09/02/2019 11:24 AM   Modules accepted: Orders

## 2019-09-02 NOTE — Patient Instructions (Signed)
-  Nice seeing you today!!  -Lab work today; will notify you once results are available.  -Remember your tetanus and shingles vaccines at the drugstore.  -Schedule follow up in 6 months.

## 2019-09-10 ENCOUNTER — Other Ambulatory Visit: Payer: Self-pay | Admitting: Internal Medicine

## 2019-09-10 DIAGNOSIS — E039 Hypothyroidism, unspecified: Secondary | ICD-10-CM

## 2019-09-10 MED ORDER — LEVOTHYROXINE SODIUM 150 MCG PO TABS: 150.0000 ug | ORAL_TABLET | Freq: Every day | ORAL | 1 refills | Status: DC

## 2019-09-11 ENCOUNTER — Other Ambulatory Visit: Payer: Self-pay

## 2019-09-12 ENCOUNTER — Other Ambulatory Visit (INDEPENDENT_AMBULATORY_CARE_PROVIDER_SITE_OTHER): Payer: Medicare Other

## 2019-09-12 DIAGNOSIS — I10 Essential (primary) hypertension: Secondary | ICD-10-CM | POA: Diagnosis not present

## 2019-09-12 LAB — CBC WITH DIFFERENTIAL/PLATELET
Basophils Absolute: 0 10*3/uL (ref 0.0–0.1)
Basophils Relative: 0.4 % (ref 0.0–3.0)
Eosinophils Absolute: 0.2 10*3/uL (ref 0.0–0.7)
Eosinophils Relative: 3.4 % (ref 0.0–5.0)
HCT: 38.8 % (ref 36.0–46.0)
Hemoglobin: 12.9 g/dL (ref 12.0–15.0)
Lymphocytes Relative: 24.9 % (ref 12.0–46.0)
Lymphs Abs: 1.7 10*3/uL (ref 0.7–4.0)
MCHC: 33.1 g/dL (ref 30.0–36.0)
MCV: 86.9 fl (ref 78.0–100.0)
Monocytes Absolute: 0.6 10*3/uL (ref 0.1–1.0)
Monocytes Relative: 8.7 % (ref 3.0–12.0)
Neutro Abs: 4.3 10*3/uL (ref 1.4–7.7)
Neutrophils Relative %: 62.6 % (ref 43.0–77.0)
Platelets: 116 10*3/uL — ABNORMAL LOW (ref 150.0–400.0)
RBC: 4.47 Mil/uL (ref 3.87–5.11)
RDW: 14 % (ref 11.5–15.5)
WBC: 6.8 10*3/uL (ref 4.0–10.5)

## 2019-09-15 DIAGNOSIS — Z23 Encounter for immunization: Secondary | ICD-10-CM | POA: Diagnosis not present

## 2019-09-16 ENCOUNTER — Other Ambulatory Visit: Payer: Self-pay | Admitting: Internal Medicine

## 2019-09-16 DIAGNOSIS — E039 Hypothyroidism, unspecified: Secondary | ICD-10-CM

## 2019-09-16 DIAGNOSIS — R7989 Other specified abnormal findings of blood chemistry: Secondary | ICD-10-CM

## 2019-09-28 ENCOUNTER — Other Ambulatory Visit: Payer: Self-pay | Admitting: Internal Medicine

## 2019-10-06 DIAGNOSIS — Z23 Encounter for immunization: Secondary | ICD-10-CM | POA: Diagnosis not present

## 2019-10-15 DIAGNOSIS — Z86018 Personal history of other benign neoplasm: Secondary | ICD-10-CM | POA: Diagnosis not present

## 2019-10-15 DIAGNOSIS — M71342 Other bursal cyst, left hand: Secondary | ICD-10-CM | POA: Diagnosis not present

## 2019-10-15 DIAGNOSIS — Z872 Personal history of diseases of the skin and subcutaneous tissue: Secondary | ICD-10-CM | POA: Diagnosis not present

## 2019-10-15 DIAGNOSIS — L821 Other seborrheic keratosis: Secondary | ICD-10-CM | POA: Diagnosis not present

## 2019-10-15 DIAGNOSIS — L578 Other skin changes due to chronic exposure to nonionizing radiation: Secondary | ICD-10-CM | POA: Diagnosis not present

## 2019-10-15 DIAGNOSIS — L570 Actinic keratosis: Secondary | ICD-10-CM | POA: Diagnosis not present

## 2019-10-28 ENCOUNTER — Telehealth: Payer: Self-pay | Admitting: Internal Medicine

## 2019-10-28 MED ORDER — LISINOPRIL 20 MG PO TABS: 20.0000 mg | ORAL_TABLET | Freq: Every day | ORAL | 1 refills | Status: DC

## 2019-10-28 NOTE — Telephone Encounter (Signed)
Refill sent.

## 2019-10-28 NOTE — Telephone Encounter (Signed)
Medication Refill: lisinopril  Pharmacy: [facility]  Phone:

## 2019-11-10 ENCOUNTER — Other Ambulatory Visit: Payer: Self-pay

## 2019-11-11 ENCOUNTER — Other Ambulatory Visit: Payer: Self-pay | Admitting: Internal Medicine

## 2019-11-11 ENCOUNTER — Other Ambulatory Visit (INDEPENDENT_AMBULATORY_CARE_PROVIDER_SITE_OTHER): Payer: Medicare Other

## 2019-11-11 DIAGNOSIS — R7989 Other specified abnormal findings of blood chemistry: Secondary | ICD-10-CM

## 2019-11-11 DIAGNOSIS — E039 Hypothyroidism, unspecified: Secondary | ICD-10-CM

## 2019-11-11 LAB — CBC WITH DIFFERENTIAL/PLATELET
Basophils Absolute: 0 10*3/uL (ref 0.0–0.1)
Basophils Relative: 0.4 % (ref 0.0–3.0)
Eosinophils Absolute: 0.3 10*3/uL (ref 0.0–0.7)
Eosinophils Relative: 4.7 % (ref 0.0–5.0)
HCT: 40.3 % (ref 36.0–46.0)
Hemoglobin: 13.5 g/dL (ref 12.0–15.0)
Lymphocytes Relative: 27.8 % (ref 12.0–46.0)
Lymphs Abs: 1.6 10*3/uL (ref 0.7–4.0)
MCHC: 33.5 g/dL (ref 30.0–36.0)
MCV: 85.5 fl (ref 78.0–100.0)
Monocytes Absolute: 0.4 10*3/uL (ref 0.1–1.0)
Monocytes Relative: 7.1 % (ref 3.0–12.0)
Neutro Abs: 3.5 10*3/uL (ref 1.4–7.7)
Neutrophils Relative %: 60 % (ref 43.0–77.0)
Platelets: 128 10*3/uL — ABNORMAL LOW (ref 150.0–400.0)
RBC: 4.71 Mil/uL (ref 3.87–5.11)
RDW: 13.9 % (ref 11.5–15.5)
WBC: 5.8 10*3/uL (ref 4.0–10.5)

## 2019-11-11 LAB — TSH: TSH: 0.27 u[IU]/mL — ABNORMAL LOW (ref 0.35–4.50)

## 2019-11-11 MED ORDER — LEVOTHYROXINE SODIUM 137 MCG PO TABS: 150.0000 ug | ORAL_TABLET | Freq: Every day | ORAL | 1 refills | Status: DC

## 2019-11-13 ENCOUNTER — Other Ambulatory Visit: Payer: Self-pay | Admitting: Internal Medicine

## 2019-11-13 DIAGNOSIS — E039 Hypothyroidism, unspecified: Secondary | ICD-10-CM

## 2019-12-12 ENCOUNTER — Other Ambulatory Visit: Payer: Self-pay | Admitting: Internal Medicine

## 2020-03-04 ENCOUNTER — Encounter: Payer: Self-pay | Admitting: Internal Medicine

## 2020-03-04 ENCOUNTER — Ambulatory Visit (INDEPENDENT_AMBULATORY_CARE_PROVIDER_SITE_OTHER): Payer: Medicare Other | Admitting: Internal Medicine

## 2020-03-04 ENCOUNTER — Other Ambulatory Visit: Payer: Self-pay

## 2020-03-04 VITALS — BP 120/78 | HR 89 | Temp 98.3°F | Wt 168.0 lb

## 2020-03-04 DIAGNOSIS — K219 Gastro-esophageal reflux disease without esophagitis: Secondary | ICD-10-CM | POA: Diagnosis not present

## 2020-03-04 DIAGNOSIS — E039 Hypothyroidism, unspecified: Secondary | ICD-10-CM

## 2020-03-04 DIAGNOSIS — I10 Essential (primary) hypertension: Secondary | ICD-10-CM

## 2020-03-04 DIAGNOSIS — E7800 Pure hypercholesterolemia, unspecified: Secondary | ICD-10-CM | POA: Diagnosis not present

## 2020-03-04 LAB — TSH: TSH: 1.04 mIU/L (ref 0.40–4.50)

## 2020-03-04 NOTE — Progress Notes (Signed)
Established Patient Office Visit     This visit occurred during the SARS-CoV-2 public health emergency.  Safety protocols were in place, including screening questions prior to the visit, additional usage of staff PPE, and extensive cleaning of exam room while observing appropriate contact time as indicated for disinfecting solutions.    CC/Reason for Visit: 49-month follow-up chronic medical conditions  HPI: Debra Mcintyre is a 79 y.o. female who is coming in today for the above mentioned reasons. Past Medical History is significant for: Well-controlled hypertension, hyperlipidemia on statin, hypothyroidism and generalized anxiety disorder on 100 mg of Zoloft.  She feels little shaky and lightheaded at times but otherwise has been doing well.  At last visit her TSH was low and her levothyroxine dose was decreased, she did not return for follow-up labs.   Past Medical/Surgical History: Past Medical History:  Diagnosis Date  . Breast cancer ([place]) 09/2012   right breast/right axillary lymph node t2,pn1, stage 11b, invasive ductal carcinma, grade 3, triple negative, with an mib-1 of 15%  . Cerebral aneurysm, nonruptured   . Cerebrovascular disease, unspecified   . Colonic polyp   . Dizziness    pt. states that she experiences this occassionally 05/03/15  . Headache    occassionally  . Heart murmur   . History of chemotherapy   . Hx of radiation therapy 05/27/13-08/13/13   right chest wall/right supraclavicular/axillary region 5220 cGy 29 sessions, right mastectomy/chest wall boost cGy 5 sessions  . Hypertension    Does not see a cardiologist  . Hypothyroidism   . Neuropathy   . Pure hypercholesterolemia   . Rosacea   . Seasonal allergies   . TIA (transient ischemic attack)   . Wears glasses     Past Surgical History:  Procedure Laterality Date  . BREAST BIOPSY Right 10/23/2012  . BREAST BIOPSY Right 10/08/2012  . COLONOSCOPY    . COLONOSCOPY W/ POLYPECTOMY    .  ESOPHAGOGASTRODUODENOSCOPY    . Left middle cerebral artery angioplasty  2004   by [name] had 2 follow up occurances  . MASTECTOMY Right   . MASTECTOMY W/ SENTINEL NODE BIOPSY Right 04/23/2013   Procedure: RIGHT TOTAL MASTECTOMY WITH SENTINEL LYMPH NODE BIOPSY;  Surgeon: [name], MD;  Location: [facility];  Service: General;  Laterality: Right;  . PORTACATH PLACEMENT N/A 11/05/2012   Procedure: INSERTION PORT-A-CATH;  Surgeon: [name], MD;  Location: [place];  Service: General;  Laterality: N/A;  . RADIOLOGY WITH ANESTHESIA N/A 05/04/2015   Procedure: MRI BRAIN WITH AND WITHOUT;  Surgeon: Medication Radiologist, MD;  Location: [facility];  Service: Radiology;  Laterality: N/A;    Social History:  reports that she quit smoking about 33 years ago. Her smoking use included cigarettes. She has a 20.00 pack-year smoking history. She has never used smokeless tobacco. She reports that she does not drink alcohol and does not use drugs.  Allergies: No Known Allergies  Family History:  Family History  Problem Relation Age of Onset  . Lung cancer Mother 64  . Heart disease Father   . Colon cancer Brother 51  . Alcohol abuse Brother   . Colon cancer Maternal Uncle        dx in his 42s  . Colon cancer Maternal Aunt   . Ovarian cancer Other 52  . Lung cancer Maternal Aunt   . Breast cancer Sister      Current Outpatient Medications:  .  aspirin EC 325 MG  tablet, Take 325 mg by mouth every morning. , Disp: , Rfl:  .  Calcium Carbonate (CALTRATE 600 PO), Take by mouth 2 (two) times daily., Disp: , Rfl:  .  clopidogrel (PLAVIX) 75 MG tablet, TAKE 1 TABLET BY MOUTH DAILY, Disp: 90 tablet, Rfl: 1 .  levothyroxine (SYNTHROID) 137 MCG tablet, Take 1 tablet (137 mcg total) by mouth daily., Disp: 90 tablet, Rfl: 1 .  lisinopril (ZESTRIL) 20 MG tablet, Take 1 tablet (20 mg total) by mouth daily., Disp: 90 tablet, Rfl: 1 .  metoprolol succinate (TOPROL-XL) 100 MG 24 hr  tablet, TAKE 1 TABLET BY MOUTH EVERY DAY WITH OR IMMEDIATELY FOLLOWING A MEAL, Disp: 90 tablet, Rfl: 1 .  Multiple Vitamin (MULTIVITAMIN WITH MINERALS) TABS tablet, Take 1 tablet by mouth every morning. Centrum Silver, Disp: , Rfl:  .  rosuvastatin (CRESTOR) 20 MG tablet, TAKE 1 TABLET BY MOUTH DAILY, Disp: 90 tablet, Rfl: 1 .  sertraline (ZOLOFT) 100 MG tablet, TAKE 1 TABLET(100 MG) BY MOUTH DAILY, Disp: 90 tablet, Rfl: 1  Review of Systems:  Constitutional: Denies fever, chills, diaphoresis, appetite change and fatigue.  HEENT: Denies photophobia, eye pain, redness, hearing loss, ear pain, congestion, sore throat, rhinorrhea, sneezing, mouth sores, trouble swallowing, neck pain, neck stiffness and tinnitus.   Respiratory: Denies SOB, DOE, cough, chest tightness,  and wheezing.   Cardiovascular: Denies chest pain, palpitations and leg swelling.  Gastrointestinal: Denies nausea, vomiting, abdominal pain, diarrhea, constipation, blood in stool and abdominal distention.  Genitourinary: Denies dysuria, urgency, frequency, hematuria, flank pain and difficulty urinating.  Endocrine: Denies: hot or cold intolerance, sweats, changes in hair or nails, polyuria, polydipsia. Musculoskeletal: Denies myalgias, back pain, joint swelling, arthralgias and gait problem.  Skin: Denies pallor, rash and wound.  Neurological: Denies dizziness, seizures, syncope, weakness,  numbness and headaches.  Hematological: Denies adenopathy. Easy bruising, personal or family bleeding history  Psychiatric/Behavioral: Denies suicidal ideation, mood changes, confusion, nervousness, sleep disturbance and agitation    Physical Exam: Vitals:   03/04/20 1334  BP: 120/78  Pulse: 89  Temp: 98.3 F (36.8 C)  TempSrc: Oral  SpO2: 97%  Weight: 168 lb (76.2 kg)    Body mass index is 26.31 kg/m.   Constitutional: NAD, calm, comfortable Eyes: PERRL, lids and conjunctivae normal ENMT: Mucous membranes are moist.    Respiratory: clear to auscultation bilaterally, no wheezing, no crackles. Normal respiratory effort. No accessory muscle use.  Cardiovascular: Regular rate and rhythm, no murmurs / rubs / gallops. No extremity edema.   Neurologic: Grossly intact and nonfocal Psychiatric: Normal judgment and insight. Alert and oriented x 3. Normal mood.    Impression and Plan:  Gastroesophageal reflux disease without esophagitis -Well-controlled, not on daily PPI therapy.  Essential hypertension -Well-controlled on current regimen.  HYPERCHOLESTEROLEMIA -Last LDL was 105 in 2021, continue rosuvastatin.  Hypothyroidism, unspecified type  - Plan: TSH    Patient Instructions  -Nice seeing you today!!  -Lab work today; will notify you once results are available.  -Schedule follow up for your physical in 6 months. Please come in fasting.       Lelon Frohlich, MD [facility] at [facility]

## 2020-03-04 NOTE — Patient Instructions (Signed)
-  Nice seeing you today!!  -Lab work today; will notify you once results are available.  -Schedule follow up for your physical in 6 months. Please come in fasting.

## 2020-04-01 ENCOUNTER — Other Ambulatory Visit: Payer: Self-pay | Admitting: Internal Medicine

## 2020-05-01 ENCOUNTER — Other Ambulatory Visit: Payer: Self-pay | Admitting: Internal Medicine

## 2020-05-07 ENCOUNTER — Other Ambulatory Visit: Payer: Self-pay | Admitting: Internal Medicine

## 2020-05-07 DIAGNOSIS — E039 Hypothyroidism, unspecified: Secondary | ICD-10-CM

## 2020-05-31 ENCOUNTER — Ambulatory Visit (INDEPENDENT_AMBULATORY_CARE_PROVIDER_SITE_OTHER): Payer: Medicare Other | Admitting: Gastroenterology

## 2020-05-31 ENCOUNTER — Encounter: Payer: Self-pay | Admitting: Gastroenterology

## 2020-05-31 ENCOUNTER — Telehealth: Payer: Self-pay

## 2020-05-31 VITALS — BP 102/60 | HR 73 | Ht 67.0 in | Wt 168.0 lb

## 2020-05-31 DIAGNOSIS — Z800 Family history of malignant neoplasm of digestive organs: Secondary | ICD-10-CM

## 2020-05-31 DIAGNOSIS — Z7901 Long term (current) use of anticoagulants: Secondary | ICD-10-CM | POA: Diagnosis not present

## 2020-05-31 DIAGNOSIS — Z86010 Personal history of colonic polyps: Secondary | ICD-10-CM | POA: Diagnosis not present

## 2020-05-31 MED ORDER — PLENVU 140 G PO SOLR: ORAL | 0 refills | Status: DC

## 2020-05-31 NOTE — Telephone Encounter (Signed)
   Debra Mcintyre February 06, 1941 [id number]  Dear Dr.Hernandez :  We have scheduled the above named patient for a(n) Colonoscopy  procedure. Our records show that (s)he is on anticoagulation therapy.  Please advise as to whether the patient may come off their therapy of plavix 5  days prior to their procedure which is scheduled for 06/14/20.  Sincerely,    [facility]

## 2020-05-31 NOTE — Progress Notes (Signed)
Referring Provider: Isaac Bliss, [facility]* Primary Care Physician:  Isaac Bliss, Rayford Halsted, MD  Reason for Consultation:  Surveillance colonoscopy  IMPRESSION:  History of colon polyps    - Normal colonoscopy 2000    - 2 small tubular adenomas 2010    - One small tubular adenoma 2016    - Surveillance colonoscopy recommended in 5 years Family history of colon cancer (brother, maternal aunt, maternal uncle)  She is interested in proceeding with colonoscopy given her family history of colon cancer.  Hold Plavix for 5 days before endoscopy.  I discussed with the patient that there is a low, but real, risk of a cardiovascular event such as heart attack, stroke, or embolism/thrombosis while off Plavix. Will communicate by phone or EMR with patient's prescribing provider to confirm that holding the Plavix is appropriate at this time.   PLAN: Surveillance colonoscopy after a Plavix washout  Please see the "Patient Instructions" section for addition details about the plan.  HPI: Debra Mcintyre is a 79 y.o. female returns in scheduled follow-up.   History of metastatic breast cancer treated with chemotherapy, mastectomy, and radiation.  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  She has stopped Plavix in preparation for prior endoscopic evaluation.  Former patient of Dr. Ricky Stabs.  Last seen in 2016.  Father with ulcerative colitis.  Brother (age 14), maternal aunt (89s), and maternal uncle (age 83s) with colon cancer.    The patient had a normal colonoscopy in 2000.  Had two small tubular adenomas removed on colonoscopy 06/2009.  A small descending tubular adenoma was removed on colonoscopy 02/10/2015.  Surveillance recommended in 5 years.  She has rare itching attributed to hemorrhoids. GI ROS is otherwise negative.   No known family history of colon cancer or polyps. No family history of uterine/endometrial cancer, pancreatic cancer  or gastric/stomach cancer.   Past Medical History:  Diagnosis Date  . Breast cancer ([place]) 09/2012   right breast/right axillary lymph node t2,pn1, stage 11b, invasive ductal carcinma, grade 3, triple negative, with an mib-1 of 15%  . Cerebral aneurysm, nonruptured   . Cerebrovascular disease, unspecified   . Colonic polyp   . Dizziness    pt. states that she experiences this occassionally 05/03/15  . Headache    occassionally  . Heart murmur   . History of chemotherapy   . Hx of radiation therapy 05/27/13-08/13/13   right chest wall/right supraclavicular/axillary region 5220 cGy 29 sessions, right mastectomy/chest wall boost cGy 5 sessions  . Hypertension    Does not see a cardiologist  . Hypothyroidism   . Neuropathy   . Pure hypercholesterolemia   . Rosacea   . Seasonal allergies   . TIA (transient ischemic attack)   . Wears glasses     Past Surgical History:  Procedure Laterality Date  . BREAST BIOPSY Right 10/23/2012  . BREAST BIOPSY Right 10/08/2012  . COLONOSCOPY    . COLONOSCOPY W/ POLYPECTOMY    . ESOPHAGOGASTRODUODENOSCOPY    . Left middle cerebral artery angioplasty  2004   by [name] had 2 follow up occurances  . MASTECTOMY Right   . MASTECTOMY W/ SENTINEL NODE BIOPSY Right 04/23/2013   Procedure: RIGHT TOTAL MASTECTOMY WITH SENTINEL LYMPH NODE BIOPSY;  Surgeon: [name], MD;  Location: [place];  Service: General;  Laterality: Right;  . PORTACATH PLACEMENT N/A 11/05/2012   Procedure: INSERTION PORT-A-CATH;  Surgeon: [name], MD;  Location: [place];  Service: General;  Laterality: N/A;  . RADIOLOGY WITH ANESTHESIA N/A 05/04/2015   Procedure: MRI BRAIN WITH AND WITHOUT;  Surgeon: Medication Radiologist, MD;  Location: [place];  Service: Radiology;  Laterality: N/A;    Current Outpatient Medications  Medication Sig Dispense Refill  . aspirin EC 325 MG tablet Take 325 mg by mouth every morning.     . Calcium Carbonate (CALTRATE  600 PO) Take by mouth 2 (two) times daily.    . clopidogrel (PLAVIX) 75 MG tablet TAKE 1 TABLET BY MOUTH DAILY 90 tablet 1  . levothyroxine (SYNTHROID) 137 MCG tablet TAKE 1 TABLET(137 MCG) BY MOUTH DAILY 90 tablet 1  . lisinopril (ZESTRIL) 20 MG tablet TAKE 1 TABLET(20 MG) BY MOUTH DAILY 90 tablet 1  . metoprolol succinate (TOPROL-XL) 100 MG 24 hr tablet TAKE 1 TABLET BY MOUTH EVERY DAY WITH OR IMMEDIATELY FOLLOWING A MEAL (Patient taking differently: 50 mg. TAKE 1/2 TABLET BY MOUTH EVERY DAY WITH OR IMMEDIATELY FOLLOWING A MEAL) 90 tablet 1  . Multiple Vitamin (MULTIVITAMIN WITH MINERALS) TABS tablet Take 1 tablet by mouth every morning. Centrum Silver    . rosuvastatin (CRESTOR) 20 MG tablet TAKE 1 TABLET BY MOUTH DAILY 90 tablet 1  . sertraline (ZOLOFT) 100 MG tablet TAKE 1 TABLET(100 MG) BY MOUTH DAILY 90 tablet 1   No current facility-administered medications for this visit.    Allergies as of 05/31/2020  . (No Known Allergies)    Family History  Problem Relation Age of Onset  . Lung cancer Mother 7  . Heart disease Father   . Colon cancer Brother 33  . Alcohol abuse Brother   . Colon cancer Maternal Uncle        dx in his 35s  . Colon cancer Maternal Aunt   . Ovarian cancer Other 63  . Lung cancer Maternal Aunt   . Breast cancer Sister     Social History   Socioeconomic History  . Marital status: Married    Spouse name: neil  . Number of children: 0  . Years of education: Not on file  . Highest education level: Not on file  Occupational History  . Occupation: retired    Fish farm manager: RETIRED  Tobacco Use  . Smoking status: Former Smoker    Packs/day: 1.00    Years: 20.00    Pack years: 20.00    Types: Cigarettes    Quit date: 04/02/1986    Years since quitting: 34.1  . Smokeless tobacco: Never Used  Substance and Sexual Activity  . Alcohol use: No  . Drug use: No  . Sexual activity: Not on file    Comment: menarche age 49, G69, P0, menopause 1996, BCP x many  years  Other Topics Concern  . Not on file  Social History Narrative  . Not on file   Social Determinants of Health   Financial Resource Strain:   . Difficulty of Paying Living Expenses: Not on file  Food Insecurity:   . Worried About Charity fundraiser in the Last Year: Not on file  . Ran Out of Food in the Last Year: Not on file  Transportation Needs:   . Lack of Transportation (Medical): Not on file  . Lack of Transportation (Non-Medical): Not on file  Physical Activity:   . Days of Exercise per Week: Not on file  . Minutes of Exercise per Session: Not on file  Stress:   . Feeling of Stress : Not on file  Social Connections:   .  Frequency of Communication with Friends and Family: Not on file  . Frequency of Social Gatherings with Friends and Family: Not on file  . Attends Religious Services: Not on file  . Active Member of Clubs or Organizations: Not on file  . Attends Archivist Meetings: Not on file  . Marital Status: Not on file  Intimate Partner Violence:   . Fear of Current or Ex-Partner: Not on file  . Emotionally Abused: Not on file  . Physically Abused: Not on file  . Sexually Abused: Not on file    Review of Systems: 12 system ROS is negative except as noted above with the addition of sinus trouble.   Physical Exam: General:   Alert,  well-nourished, pleasant and cooperative in NAD Head:  Normocephalic and atraumatic. Eyes:  Sclera clear, no icterus.   Conjunctiva pink. Ears:  Normal auditory acuity. Nose:  No deformity, discharge,  or lesions. Mouth:  No deformity or lesions.   Neck:  Supple; no masses or thyromegaly. Lungs:  Clear throughout to auscultation.   No wheezes. Heart:  Regular rate and rhythm; no murmurs. Abdomen:  Soft,nontender, nondistended, normal bowel sounds, no rebound or guarding. No hepatosplenomegaly.   Rectal:  Deferred  Msk:  Symmetrical. No boney deformities LAD: No inguinal or umbilical LAD Extremities:  No clubbing  or edema. Neurologic:  Alert and  oriented x4;  grossly nonfocal Skin:  Intact without significant lesions or rashes. Psych:  Alert and cooperative. Normal mood and affect.    Lewis Keats L. Tarri Glenn, MD, MPH 05/31/2020, 2:22 PM

## 2020-05-31 NOTE — Patient Instructions (Addendum)
Tips for colonoscopy:  - Stay well hydrated for 3-4 days prior to the exam. This reduces nausea and dehydration.  - To prevent skin/hemorrhoid irritation - prior to wiping, put A&Dointment or vaseline on the toilet paper. - Keep a towel or pad on the bed.  - Drink  64oz of clear liquids in the morning of prep day (prior to starting the prep) to be sure that there is enough fluid to flush the colon and stay hydrated!!!! This is in addition to the fluids required for preparation. - Use of a flavored hard candy, such as grape Anise Salvo, can counteract some of the flavor of the prep and may prevent some nausea.   If you are age 15 or older, your body mass index should be between 23-30. Your Body mass index is 26.31 kg/m. If this is out of the aforementioned range listed, please consider follow up with your Primary Care Provider.  If you are age 4 or younger, your body mass index should be between 19-25. Your Body mass index is 26.31 kg/m. If this is out of the aformentioned range listed, please consider follow up with your Primary Care Provider.   You have been scheduled for a colonoscopy. Please follow written instructions given to you at your visit today.  Please pick up your prep supplies at the pharmacy within the next 1-3 days. If you use inhalers (even only as needed), please bring them with you on the day of your procedure.  Due to recent changes in healthcare laws, you may see the results of your imaging and laboratory studies on MyChart before your provider has had a chance to review them.  We understand that in some cases there may be results that are confusing or concerning to you. Not all laboratory results come back in the same time frame and the provider may be waiting for multiple results in order to interpret others.  Please give [place] 48 hours in order for your provider to thoroughly review all the results before contacting the office for clarification of your results.

## 2020-06-03 ENCOUNTER — Other Ambulatory Visit: Payer: Self-pay | Admitting: Internal Medicine

## 2020-06-08 ENCOUNTER — Telehealth: Payer: Self-pay | Admitting: Gastroenterology

## 2020-06-08 NOTE — Telephone Encounter (Signed)
I contacted Parish and she will come by and pick up a Plenvu sample kit. 

## 2020-06-10 ENCOUNTER — Telehealth: Payer: Self-pay

## 2020-06-10 NOTE — Telephone Encounter (Signed)
Actually informed Arman Bogus, RN that I will speak to [facility] who sent letter on 05/31/20 and ask her to follow up.

## 2020-06-10 NOTE — Telephone Encounter (Signed)
Spoke with Estill Bamberg about patient stopping Plavix. Note sent to DR. Hernandez on 05/31/20 questioning if patient could stop Plavix five days prior to colonoscopy. No response documented. Estill Bamberg to follow up

## 2020-06-10 NOTE — Telephone Encounter (Signed)
Informed patient to hold plavix 5 days prior to procedure. Patient understood to hold plavix.

## 2020-06-10 NOTE — Telephone Encounter (Signed)
Ok to stop 5 days prior to procedure and resume as soon as possible afterwards.

## 2020-06-10 NOTE — Telephone Encounter (Signed)
Is it okay to stop taking Plavix?

## 2020-06-14 ENCOUNTER — Telehealth: Payer: Self-pay | Admitting: Gastroenterology

## 2020-06-14 ENCOUNTER — Other Ambulatory Visit: Payer: Self-pay

## 2020-06-14 ENCOUNTER — Encounter: Payer: Medicare Other | Admitting: Gastroenterology

## 2020-06-14 MED ORDER — PLENVU 140 G PO SOLR: ORAL | 0 refills | Status: DC

## 2020-06-14 NOTE — Telephone Encounter (Signed)
Plenvu prescription sent to pts pharmacy.

## 2020-06-14 NOTE — Telephone Encounter (Signed)
Noted. Thanks.

## 2020-06-16 ENCOUNTER — Encounter: Payer: Medicare Other | Admitting: Gastroenterology

## 2020-06-17 DIAGNOSIS — Z23 Encounter for immunization: Secondary | ICD-10-CM | POA: Diagnosis not present

## 2020-06-30 ENCOUNTER — Encounter: Payer: Self-pay | Admitting: Internal Medicine

## 2020-06-30 ENCOUNTER — Other Ambulatory Visit: Payer: Self-pay

## 2020-06-30 ENCOUNTER — Ambulatory Visit (INDEPENDENT_AMBULATORY_CARE_PROVIDER_SITE_OTHER): Payer: Medicare Other | Admitting: Internal Medicine

## 2020-06-30 VITALS — BP 110/60 | HR 64 | Temp 98.3°F | Wt 167.6 lb

## 2020-06-30 DIAGNOSIS — K4090 Unilateral inguinal hernia, without obstruction or gangrene, not specified as recurrent: Secondary | ICD-10-CM

## 2020-06-30 DIAGNOSIS — R109 Unspecified abdominal pain: Secondary | ICD-10-CM | POA: Diagnosis not present

## 2020-06-30 DIAGNOSIS — R1011 Right upper quadrant pain: Secondary | ICD-10-CM

## 2020-06-30 DIAGNOSIS — R140 Abdominal distension (gaseous): Secondary | ICD-10-CM | POA: Diagnosis not present

## 2020-06-30 LAB — POCT URINALYSIS DIPSTICK
Bilirubin, UA: NEGATIVE
Blood, UA: NEGATIVE
Glucose, UA: NEGATIVE
Ketones, UA: NEGATIVE
Leukocytes, UA: NEGATIVE
Nitrite, UA: NEGATIVE
Protein, UA: NEGATIVE
Spec Grav, UA: 1.015 (ref 1.010–1.025)
Urobilinogen, UA: 0.2 E.U./dL
pH, UA: 6 (ref 5.0–8.0)

## 2020-06-30 NOTE — Patient Instructions (Signed)
-  Nice seeing you today!!  -Lab work today; will notify you once results are available.  -Abdominal ultrasound will be requested today. Will notify you once results are available.

## 2020-06-30 NOTE — Progress Notes (Signed)
Acute Office Visit     This visit occurred during the SARS-CoV-2 public health emergency.  Safety protocols were in place, including screening questions prior to the visit, additional usage of staff PPE, and extensive cleaning of exam room while observing appropriate contact time as indicated for disinfecting solutions.    CC/Reason for Visit: Discuss abdominal pain and abdominal distention  HPI: Debra Mcintyre is a 79 y.o. female who is coming in today for the above mentioned reasons.  For the past 3 weeks she has noticed an increasing pattern of right upper quadrant abdominal pain and nausea.  She has also had increased abdominal bloating and gas.  She feels like the frequency of her bowel movements has increased although it is not diarrhea.  She has not had any vomiting or fever.  She feels like the pain gets worse after eating.  She does not noticed any increased pain with positioning, she has no urinary symptoms such as dysuria, frequency.  She has had no prior abdominal surgeries.  She also has a small "growth" in her right inguinal crease that she would like me to look at.   Past Medical/Surgical History: Past Medical History:  Diagnosis Date  . Breast cancer ([place]) 09/2012   right breast/right axillary lymph node t2,pn1, stage 11b, invasive ductal carcinma, grade 3, triple negative, with an mib-1 of 15%  . Cerebral aneurysm, nonruptured   . Cerebrovascular disease, unspecified   . Colonic polyp   . Dizziness    pt. states that she experiences this occassionally 05/03/15  . Headache    occassionally  . Heart murmur   . History of chemotherapy   . Hx of radiation therapy 05/27/13-08/13/13   right chest wall/right supraclavicular/axillary region 5220 cGy 29 sessions, right mastectomy/chest wall boost cGy 5 sessions  . Hypertension    Does not see a cardiologist  . Hypothyroidism   . Neuropathy   . Pure hypercholesterolemia   . Rosacea   . Seasonal allergies   .  TIA (transient ischemic attack)   . Wears glasses     Past Surgical History:  Procedure Laterality Date  . BREAST BIOPSY Right 10/23/2012  . BREAST BIOPSY Right 10/08/2012  . COLONOSCOPY    . COLONOSCOPY W/ POLYPECTOMY    . ESOPHAGOGASTRODUODENOSCOPY    . Left middle cerebral artery angioplasty  2004   by [name] had 2 follow up occurances  . MASTECTOMY Right   . MASTECTOMY W/ SENTINEL NODE BIOPSY Right 04/23/2013   Procedure: RIGHT TOTAL MASTECTOMY WITH SENTINEL LYMPH NODE BIOPSY;  Surgeon: [name], MD;  Location: [place];  Service: General;  Laterality: Right;  . PORTACATH PLACEMENT N/A 11/05/2012   Procedure: INSERTION PORT-A-CATH;  Surgeon: [name], MD;  Location: [place];  Service: General;  Laterality: N/A;  . RADIOLOGY WITH ANESTHESIA N/A 05/04/2015   Procedure: MRI BRAIN WITH AND WITHOUT;  Surgeon: Medication Radiologist, MD;  Location: [place];  Service: Radiology;  Laterality: N/A;    Social History:  reports that she quit smoking about 34 years ago. Her smoking use included cigarettes. She has a 20.00 pack-year smoking history. She has never used smokeless tobacco. She reports that she does not drink alcohol and does not use drugs.  Allergies: No Known Allergies  Family History:  Family History  Problem Relation Age of Onset  . Lung cancer Mother 79  . Heart disease Father   . Colon cancer Brother 70  . Alcohol abuse Brother   .  Colon cancer Maternal Uncle        dx in his 25s  . Colon cancer Maternal Aunt   . Ovarian cancer Other 71  . Lung cancer Maternal Aunt   . Breast cancer Sister      Current Outpatient Medications:  .  aspirin EC 325 MG tablet, Take 325 mg by mouth every morning. , Disp: , Rfl:  .  Calcium Carbonate (CALTRATE 600 PO), Take by mouth 2 (two) times daily., Disp: , Rfl:  .  clopidogrel (PLAVIX) 75 MG tablet, TAKE 1 TABLET BY MOUTH DAILY, Disp: 90 tablet, Rfl: 1 .  levothyroxine (SYNTHROID) 137 MCG  tablet, TAKE 1 TABLET(137 MCG) BY MOUTH DAILY, Disp: 90 tablet, Rfl: 1 .  lisinopril (ZESTRIL) 20 MG tablet, TAKE 1 TABLET(20 MG) BY MOUTH DAILY, Disp: 90 tablet, Rfl: 1 .  metoprolol succinate (TOPROL-XL) 100 MG 24 hr tablet, TAKE 1 TABLET BY MOUTH EVERY DAY WITH OR IMMEDIATELY FOLLOWING A MEAL (Patient taking differently: 50 mg. TAKE 1/2 TABLET BY MOUTH EVERY DAY WITH OR IMMEDIATELY FOLLOWING A MEAL), Disp: 90 tablet, Rfl: 1 .  Multiple Vitamin (MULTIVITAMIN WITH MINERALS) TABS tablet, Take 1 tablet by mouth every morning. Centrum Silver, Disp: , Rfl:  .  PEG-KCl-NaCl-NaSulf-Na Asc-C (PLENVU) 140 g SOLR, Use kit as directed, Disp: 1 each, Rfl: 0 .  rosuvastatin (CRESTOR) 20 MG tablet, TAKE 1 TABLET BY MOUTH DAILY, Disp: 90 tablet, Rfl: 1 .  sertraline (ZOLOFT) 100 MG tablet, TAKE 1 TABLET(100 MG) BY MOUTH DAILY, Disp: 90 tablet, Rfl: 1  Review of Systems:  Constitutional: Denies fever, chills, diaphoresis. HEENT: Denies photophobia, eye pain, redness, hearing loss, ear pain, congestion, sore throat, rhinorrhea, sneezing, mouth sores, trouble swallowing, neck pain, neck stiffness and tinnitus.   Respiratory: Denies SOB, DOE, cough, chest tightness,  and wheezing.   Cardiovascular: Denies chest pain, palpitations and leg swelling.  Gastrointestinal: Denies  vomiting,diarrhea, constipation, blood in stool. Genitourinary: Denies dysuria, urgency, frequency, hematuria, flank pain and difficulty urinating.  Endocrine: Denies: hot or cold intolerance, sweats, changes in hair or nails, polyuria, polydipsia. Musculoskeletal: Denies myalgias, back pain, joint swelling, arthralgias and gait problem.  Skin: Denies pallor, rash and wound.  Neurological: Denies dizziness, seizures, syncope, weakness, light-headedness, numbness and headaches.  Hematological: Denies adenopathy. Easy bruising, personal or family bleeding history  Psychiatric/Behavioral: Denies suicidal ideation, mood changes, confusion,  nervousness, sleep disturbance and agitation    Physical Exam: Vitals:   06/30/20 1410  BP: 110/60  Pulse: 64  Temp: 98.3 F (36.8 C)  TempSrc: Oral  SpO2: 97%  Weight: 167 lb 9.6 oz (76 kg)    Body mass index is 26.25 kg/m.   Constitutional: NAD, calm, comfortable Eyes: PERRL, lids and conjunctivae normal ENMT: Mucous membranes are moist.  Abdomen: no tenderness, no masses palpated. No hepatosplenomegaly. Bowel sounds positive.  Musculoskeletal: Soft, tender to palpation of the right upper quadrant, no rebound or guarding, positive Murphy sign.  Small oval-shaped mass in the right inguinal crease that is easily reducible and protrudes with Valsalva maneuvers such as coughing and straining. Neurologic: Grossly intact and nonfocal Psychiatric: Normal judgment and insight. Alert and oriented x 3. Normal mood.    Impression and Plan:  RUQ abdominal pain Bloating -Mostly concerned for gallbladder pathology today including cholelithiasis/acute cholecystitis. -Renal colic would be less likely given absence of urinary symptoms.  Nonetheless a urine dipstick has been done in office today that is negative for leukocyte ester right, blood, nitrates. -Check CBC, comprehensive metabolic panel, send  for right upper quadrant ultrasound. -Further work-up to follow.  Right inguinal hernia -This appears to be the most likely explanation for her right inguinal oval-shaped mass. -Do not believe this is related to her right upper quadrant pain. -Not painful, easily reducible, I would recommend observation for now with referral to surgery if needed.   Patient Instructions  -Nice seeing you today!!  -Lab work today; will notify you once results are available.  -Abdominal ultrasound will be requested today. Will notify you once results are available.     Lelon Frohlich, MD [facility] at [facility]

## 2020-06-30 NOTE — Addendum Note (Signed)
Addended by: Marrion Coy on: 06/30/2020 02:46 PM   Modules accepted: Orders

## 2020-07-01 LAB — COMPREHENSIVE METABOLIC PANEL
AG Ratio: 1.6 (calc) (ref 1.0–2.5)
ALT: 23 U/L (ref 6–29)
AST: 24 U/L (ref 10–35)
Albumin: 4.5 g/dL (ref 3.6–5.1)
Alkaline phosphatase (APISO): 46 U/L (ref 37–153)
BUN: 22 mg/dL (ref 7–25)
CO2: 26 mmol/L (ref 20–32)
Calcium: 9.9 mg/dL (ref 8.6–10.4)
Chloride: 102 mmol/L (ref 98–110)
Creat: 0.79 mg/dL (ref 0.60–0.93)
Globulin: 2.8 g/dL (calc) (ref 1.9–3.7)
Glucose, Bld: 92 mg/dL (ref 65–99)
Potassium: 4.2 mmol/L (ref 3.5–5.3)
Sodium: 137 mmol/L (ref 135–146)
Total Bilirubin: 0.9 mg/dL (ref 0.2–1.2)
Total Protein: 7.3 g/dL (ref 6.1–8.1)

## 2020-07-01 LAB — CBC WITH DIFFERENTIAL/PLATELET
Absolute Monocytes: 732 cells/uL (ref 200–950)
Basophils Absolute: 62 cells/uL (ref 0–200)
Basophils Relative: 0.8 %
Eosinophils Absolute: 231 cells/uL (ref 15–500)
Eosinophils Relative: 3 %
HCT: 41.4 % (ref 35.0–45.0)
Hemoglobin: 13.4 g/dL (ref 11.7–15.5)
Lymphs Abs: 2141 cells/uL (ref 850–3900)
MCH: 28.2 pg (ref 27.0–33.0)
MCHC: 32.4 g/dL (ref 32.0–36.0)
MCV: 87.2 fL (ref 80.0–100.0)
MPV: 11.3 fL (ref 7.5–12.5)
Monocytes Relative: 9.5 %
Neutro Abs: 4535 cells/uL (ref 1500–7800)
Neutrophils Relative %: 58.9 %
Platelets: 83 10*3/uL — ABNORMAL LOW (ref 140–400)
RBC: 4.75 10*6/uL (ref 3.80–5.10)
RDW: 13 % (ref 11.0–15.0)
Total Lymphocyte: 27.8 %
WBC: 7.7 10*3/uL (ref 3.8–10.8)

## 2020-07-05 ENCOUNTER — Telehealth: Payer: Self-pay | Admitting: Gastroenterology

## 2020-07-05 ENCOUNTER — Other Ambulatory Visit: Payer: Self-pay | Admitting: Internal Medicine

## 2020-07-05 DIAGNOSIS — R7989 Other specified abnormal findings of blood chemistry: Secondary | ICD-10-CM

## 2020-07-05 MED ORDER — NA SULFATE-K SULFATE-MG SULF 17.5-3.13-1.6 GM/177ML PO SOLN: 1.0000 | Freq: Once | ORAL | 0 refills | Status: AC

## 2020-07-05 NOTE — Telephone Encounter (Signed)
[facility] called to let us know that because pt is  On Gov't Assistance, they cannot use the discount card,  Can send in RX for [place].

## 2020-07-05 NOTE — Telephone Encounter (Signed)
Pharmacy advised patient that the Plenvu medication is requiring prior authorization , please advise

## 2020-07-05 NOTE — Telephone Encounter (Signed)
Pt had OV , no PV

## 2020-07-05 NOTE — Telephone Encounter (Signed)
error 

## 2020-07-05 NOTE — Telephone Encounter (Signed)
Called this pt to remind her of her appt. She stated she left a msg today, she stated that her pharmacy does not have plenvu so she wanted to come pick up from here since that's what Dr. Modena Nunnery wants and told her she may do.  Please call patient her appt is on Monday 29.

## 2020-07-05 NOTE — Telephone Encounter (Signed)
Per our discussion, I am routing this message to you.

## 2020-07-05 NOTE — Telephone Encounter (Signed)
Returned pt call. Advised we do not have any sample preps to provide at this time. Advised her Rx was sent to the pharmacy. Asked if she inquired with the pharmacy if the Rx was on backorder? States she did not bother to ask. Advised she call the pharmacy to inquire if the Rx is on backorder. If so, will change Rx to Suprep. If not, advised she is welcome to stop by our office to pick up Plenvu Coupon for activation. Also reminded that a PA will not be completed for any of the preps ordered by our office. Pt was provided with my name and was advised to call me back with an update about her Rx. Also reminded of our office hours this week d/t holiday observance. Advised we will need to assure this issue is resolved before her procedure on the 30th. Verbalized acceptance and understanding.

## 2020-07-05 NOTE — Telephone Encounter (Signed)
Informed patient that Suprep has been sent to pharmacy and there were new instructions via mychart for her to follow.Patient stated she understood and would follow new instructions.

## 2020-07-12 ENCOUNTER — Ambulatory Visit (AMBULATORY_SURGERY_CENTER): Payer: Medicare Other | Admitting: Gastroenterology

## 2020-07-12 ENCOUNTER — Encounter: Payer: Self-pay | Admitting: Gastroenterology

## 2020-07-12 ENCOUNTER — Other Ambulatory Visit: Payer: Self-pay

## 2020-07-12 VITALS — BP 124/76 | HR 99 | Temp 96.2°F | Resp 12 | Ht 67.0 in | Wt 168.0 lb

## 2020-07-12 DIAGNOSIS — D120 Benign neoplasm of cecum: Secondary | ICD-10-CM

## 2020-07-12 DIAGNOSIS — Z86010 Personal history of colonic polyps: Secondary | ICD-10-CM

## 2020-07-12 DIAGNOSIS — I10 Essential (primary) hypertension: Secondary | ICD-10-CM | POA: Diagnosis not present

## 2020-07-12 DIAGNOSIS — Z1211 Encounter for screening for malignant neoplasm of colon: Secondary | ICD-10-CM | POA: Diagnosis not present

## 2020-07-12 DIAGNOSIS — D125 Benign neoplasm of sigmoid colon: Secondary | ICD-10-CM

## 2020-07-12 DIAGNOSIS — Z800 Family history of malignant neoplasm of digestive organs: Secondary | ICD-10-CM

## 2020-07-12 MED ORDER — SODIUM CHLORIDE 0.9 % IV SOLN: 500.0000 mL | Freq: Once | INTRAVENOUS | Status: DC

## 2020-07-12 NOTE — Progress Notes (Signed)
1038 Ephedrine 10 mg given IV due to low BP, MD updated.

## 2020-07-12 NOTE — Progress Notes (Signed)
Called to room to assist during endoscopic procedure.  Patient ID and intended procedure confirmed with present staff. Received instructions for my participation in the procedure from the performing physician.  

## 2020-07-12 NOTE — Op Note (Signed)
[facility] Patient Name: Debra Mcintyre Procedure Date: 07/12/2020 10:21 AM MRN: [id number] Endoscopist: Thornton Park MD, MD Age: 79 Referring MD:  Date of Birth: December 08, 1940 Gender: Female Account #: [id number] Procedure:                Colonoscopy Indications:              Surveillance: Personal history of adenomatous                            polyps on last colonoscopy 5 years ago                           History of colon polyps                           - Normal colonoscopy 2000                           - 2 small tubular adenomas 2010                           - One small tubular adenoma 2016                           - Surveillance colonoscopy recommended in 5 years                           Family history of colon cancer (brother, maternal                            aunt, maternal uncle) Medicines:                Monitored Anesthesia Care Procedure:                Pre-Anesthesia Assessment:                           - Prior to the procedure, a History and Physical                            was performed, and patient medications and                            allergies were reviewed. The patient's tolerance of                            previous anesthesia was also reviewed. The risks                            and benefits of the procedure and the sedation                            options and risks were discussed with the patient.                            All questions were answered, and informed consent  was obtained. Prior Anticoagulants: The patient has                            taken Plavix (clopidogrel), last dose was 5 days                            prior to procedure. ASA Grade Assessment: III - A                            patient with severe systemic disease. After                            reviewing the risks and benefits, the patient was                            deemed in satisfactory condition to undergo the                             procedure.                           After obtaining informed consent, the colonoscope                            was passed under direct vision. Throughout the                            procedure, the patient's blood pressure, pulse, and                            oxygen saturations were monitored continuously. The                            Colonoscope was introduced through the anus and                            advanced to the 3 cm into the ileum. A second                            forward view of the right colon was performed. The                            colonoscopy was performed with moderate difficulty                            due to a redundant colon and significant looping.                            Successful completion of the procedure was aided by                            applying abdominal pressure. The patient tolerated  the procedure well. The quality of the bowel                            preparation was good. The terminal ileum, ileocecal                            valve, appendiceal orifice, and rectum were                            photographed. Scope In: 10:38:19 AM Scope Out: 10:53:24 AM Scope Withdrawal Time: 0 hours 10 minutes 3 seconds  Total Procedure Duration: 0 hours 15 minutes 5 seconds  Findings:                 The perianal and digital rectal examinations were                            normal.                           Two sessile polyps were found in the sigmoid colon.                            The polyps were 2 to 5 mm in size. These polyps                            were removed with a cold snare. Resection and                            retrieval were complete. Estimated blood loss was                            minimal.                           A 2 mm polyp was found in the cecum. The polyp was                            flat. The polyp was removed with a cold snare.                             Resection and retrieval were complete. Estimated                            blood loss was minimal.                           The exam was otherwise without abnormality on                            direct and retroflexion views. Complications:            No immediate complications. Estimated blood loss:  Minimal. Estimated Blood Loss:     Estimated blood loss was minimal. Impression:               - Two 2 to 5 mm polyps in the sigmoid colon,                            removed with a cold snare. Resected and retrieved.                           - One 2 mm polyp in the cecum, removed with a cold                            snare. Resected and retrieved.                           - The examination was otherwise normal on direct                            and retroflexion views. Recommendation:           - Patient has a contact number available for                            emergencies. The signs and symptoms of potential                            delayed complications were discussed with the                            patient. Return to normal activities tomorrow.                            Written discharge instructions were provided to the                            patient.                           - Resume previous diet.                           - Continue present medications.                           - Resume Plavix (clopidogrel) at prior dose                            tomorrow.                           - Await pathology results.                           - Surveillance colonoscopy is not routinely                            recommended over  age 29.                           - Emerging evidence supports eating a diet of                            fruits, vegetables, grains, calcium, and yogurt                            while reducing red meat and alcohol may reduce the                            risk of colon cancer.                           -  Thank you for allowing me to be involved in your                            colon cancer prevention. Thornton Park MD, MD 07/12/2020 11:00:38 AM This report has been signed electronically.

## 2020-07-12 NOTE — Patient Instructions (Signed)
Handout provided on polyps.  Resume Plavix (clopidogrel) at prior dose tomorrow.   Surveillance colonoscopy is not routinely recommmended over age 77.   YOU HAD AN ENDOSCOPIC PROCEDURE TODAY AT [facility]:   Refer to the procedure report that was given to you for any specific questions about what was found during the examination.  If the procedure report does not answer your questions, please call your gastroenterologist to clarify.  If you requested that your care partner not be given the details of your procedure findings, then the procedure report has been included in a sealed envelope for you to review at your convenience later.  YOU SHOULD EXPECT: Some feelings of bloating in the abdomen. Passage of more gas than usual.  Walking can help get rid of the air that was put into your GI tract during the procedure and reduce the bloating. If you had a lower endoscopy (such as a colonoscopy or flexible sigmoidoscopy) you may notice spotting of blood in your stool or on the toilet paper. If you underwent a bowel prep for your procedure, you may not have a normal bowel movement for a few days.  Please Note:  You might notice some irritation and congestion in your nose or some drainage.  This is from the oxygen used during your procedure.  There is no need for concern and it should clear up in a day or so.  SYMPTOMS TO REPORT IMMEDIATELY:   Following lower endoscopy (colonoscopy or flexible sigmoidoscopy):  Excessive amounts of blood in the stool  Significant tenderness or worsening of abdominal pains  Swelling of the abdomen that is new, acute  Fever of 100F or higher  For urgent or emergent issues, a gastroenterologist can be reached at any hour by calling [phone]. Do not use MyChart messaging for urgent concerns.    DIET:  We do recommend a small meal at first, but then you may proceed to your regular diet.  Drink plenty of fluids but you should avoid alcoholic  beverages for 24 hours.  ACTIVITY:  You should plan to take it easy for the rest of today and you should NOT DRIVE or use heavy machinery until tomorrow (because of the sedation medicines used during the test).    FOLLOW UP: Our staff will call the number listed on your records 48-72 hours following your procedure to check on you and address any questions or concerns that you may have regarding the information given to you following your procedure. If we do not reach you, we will leave a message.  We will attempt to reach you two times.  During this call, we will ask if you have developed any symptoms of COVID 19. If you develop any symptoms (ie: fever, flu-like symptoms, shortness of breath, cough etc.) before then, please call [phone].  If you test positive for Covid 19 in the 2 weeks post procedure, please call and report this information to [place].    If any biopsies were taken you will be contacted by phone or by letter within the next 1-3 weeks.  Please call us at [phone] if you have not heard about the biopsies in 3 weeks.    SIGNATURES/CONFIDENTIALITY: You and/or your care partner have signed paperwork which will be entered into your electronic medical record.  These signatures attest to the fact that that the information above on your After Visit Summary has been reviewed and is understood.  Full responsibility of the confidentiality of this discharge information  lies with you and/or your care-partner.

## 2020-07-12 NOTE — Progress Notes (Signed)
Report given to PACU, vss 

## 2020-07-12 NOTE — Progress Notes (Signed)
Vs by [facility]

## 2020-07-14 ENCOUNTER — Telehealth: Payer: Self-pay | Admitting: *Deleted

## 2020-07-14 ENCOUNTER — Other Ambulatory Visit: Payer: Self-pay | Admitting: Internal Medicine

## 2020-07-14 ENCOUNTER — Telehealth: Payer: Self-pay

## 2020-07-14 DIAGNOSIS — Z1231 Encounter for screening mammogram for malignant neoplasm of breast: Secondary | ICD-10-CM

## 2020-07-14 NOTE — Telephone Encounter (Signed)
LVM

## 2020-07-14 NOTE — Telephone Encounter (Signed)
Follow up call. No answer. VM left.

## 2020-07-15 NOTE — Addendum Note (Signed)
Addended by: Marrion Coy on: 07/15/2020 02:45 PM   Modules accepted: Orders

## 2020-07-16 ENCOUNTER — Encounter: Payer: Self-pay | Admitting: Gastroenterology

## 2020-07-21 ENCOUNTER — Other Ambulatory Visit: Payer: Self-pay

## 2020-07-21 ENCOUNTER — Ambulatory Visit
Admission: RE | Admit: 2020-07-21 | Discharge: 2020-07-21 | Disposition: A | Discharge status: Home / Self Care | Payer: Medicare Other | Source: Ambulatory Visit | Attending: Internal Medicine | Admitting: Internal Medicine

## 2020-07-21 DIAGNOSIS — R1011 Right upper quadrant pain: Secondary | ICD-10-CM | POA: Diagnosis not present

## 2020-07-21 DIAGNOSIS — R140 Abdominal distension (gaseous): Secondary | ICD-10-CM

## 2020-07-22 ENCOUNTER — Other Ambulatory Visit: Payer: Self-pay | Admitting: Internal Medicine

## 2020-07-22 DIAGNOSIS — I10 Essential (primary) hypertension: Secondary | ICD-10-CM

## 2020-08-16 ENCOUNTER — Other Ambulatory Visit: Payer: Self-pay

## 2020-08-17 ENCOUNTER — Other Ambulatory Visit: Payer: Self-pay

## 2020-08-17 ENCOUNTER — Other Ambulatory Visit (INDEPENDENT_AMBULATORY_CARE_PROVIDER_SITE_OTHER): Payer: Medicare Other

## 2020-08-17 DIAGNOSIS — R7989 Other specified abnormal findings of blood chemistry: Secondary | ICD-10-CM

## 2020-08-17 LAB — CBC WITH DIFFERENTIAL/PLATELET
Basophils Absolute: 0 10*3/uL (ref 0.0–0.1)
Basophils Relative: 0.5 % (ref 0.0–3.0)
Eosinophils Absolute: 0.2 10*3/uL (ref 0.0–0.7)
Eosinophils Relative: 2.5 % (ref 0.0–5.0)
HCT: 39.6 % (ref 36.0–46.0)
Hemoglobin: 13.1 g/dL (ref 12.0–15.0)
Lymphocytes Relative: 27.3 % (ref 12.0–46.0)
Lymphs Abs: 1.9 10*3/uL (ref 0.7–4.0)
MCHC: 33.1 g/dL (ref 30.0–36.0)
MCV: 85.8 fl (ref 78.0–100.0)
Monocytes Absolute: 0.6 10*3/uL (ref 0.1–1.0)
Monocytes Relative: 8.7 % (ref 3.0–12.0)
Neutro Abs: 4.3 10*3/uL (ref 1.4–7.7)
Neutrophils Relative %: 61 % (ref 43.0–77.0)
Platelets: 156 10*3/uL (ref 150.0–400.0)
RBC: 4.61 Mil/uL (ref 3.87–5.11)
RDW: 13.6 % (ref 11.5–15.5)
WBC: 7.1 10*3/uL (ref 4.0–10.5)

## 2020-08-21 ENCOUNTER — Encounter: Payer: Self-pay | Admitting: Internal Medicine

## 2020-08-26 ENCOUNTER — Ambulatory Visit: Payer: Medicare Other

## 2020-09-21 ENCOUNTER — Other Ambulatory Visit: Payer: Self-pay | Admitting: Internal Medicine

## 2020-09-24 ENCOUNTER — Other Ambulatory Visit: Payer: Self-pay

## 2020-09-24 ENCOUNTER — Ambulatory Visit
Admission: RE | Admit: 2020-09-24 | Discharge: 2020-09-24 | Disposition: A | Discharge status: Home / Self Care | Payer: Medicare Other | Source: Ambulatory Visit | Attending: Internal Medicine | Admitting: Internal Medicine

## 2020-09-24 DIAGNOSIS — Z1231 Encounter for screening mammogram for malignant neoplasm of breast: Secondary | ICD-10-CM | POA: Diagnosis not present

## 2020-09-28 ENCOUNTER — Other Ambulatory Visit: Payer: Self-pay | Admitting: Internal Medicine

## 2020-10-19 ENCOUNTER — Ambulatory Visit (INDEPENDENT_AMBULATORY_CARE_PROVIDER_SITE_OTHER): Payer: Medicare Other | Admitting: Gastroenterology

## 2020-10-19 ENCOUNTER — Other Ambulatory Visit: Payer: Self-pay

## 2020-10-19 ENCOUNTER — Encounter: Payer: Self-pay | Admitting: Gastroenterology

## 2020-10-19 ENCOUNTER — Other Ambulatory Visit (INDEPENDENT_AMBULATORY_CARE_PROVIDER_SITE_OTHER): Payer: Medicare Other

## 2020-10-19 VITALS — BP 120/70 | HR 70 | Ht 67.0 in | Wt 165.2 lb

## 2020-10-19 DIAGNOSIS — R194 Change in bowel habit: Secondary | ICD-10-CM

## 2020-10-19 DIAGNOSIS — R140 Abdominal distension (gaseous): Secondary | ICD-10-CM

## 2020-10-19 DIAGNOSIS — E785 Hyperlipidemia, unspecified: Secondary | ICD-10-CM

## 2020-10-19 DIAGNOSIS — K4090 Unilateral inguinal hernia, without obstruction or gangrene, not specified as recurrent: Secondary | ICD-10-CM | POA: Diagnosis not present

## 2020-10-19 LAB — TSH: TSH: 0.34 u[IU]/mL — ABNORMAL LOW (ref 0.35–4.50)

## 2020-10-19 LAB — HIGH SENSITIVITY CRP: CRP, High Sensitivity: 0.85 mg/L (ref 0.000–5.000)

## 2020-10-19 MED ORDER — RIFAXIMIN 550 MG PO TABS: 550.0000 mg | ORAL_TABLET | Freq: Three times a day (TID) | ORAL | 0 refills | Status: AC

## 2020-10-19 NOTE — Progress Notes (Signed)
Referring Provider: Isaac Bliss, [place] [facility]* Primary Care Physician:  Isaac Bliss, Rayford Halsted, MD  Chief complaint:  Change in bowel habits  IMPRESSION:  Change in bowel habits with bloat and leakage    - developed after colonoscopy 06/2020 Right inguinal hernia History of colon polyps    - Normal colonoscopy 2000    - 2 small tubular adenomas 2010    - One small tubular adenoma 2016    - Tubular adenoma, sessile serrated polyp, hyperplastic polyp 07/12/20    - no surveillance recommended given age Family history of colon cancer (brother, maternal aunt, maternal uncle)  PLAN: - Fecal calprotectin, CRP, TSH, Gi pathogen panel, pancreatic elastase - Add a daily dose of psyllium or methylcellulose - Trial of Xifaxan 550 mg TID x 14 days for possible SIBO - Trial of PeptoBismal capsules 2 TID PRN for ongoing diarrhea during the evaluation - Referral to surgery re: questions about inguinal hernia - Follow-up in 4 weeks, earlier if needed  HPI: Debra Mcintyre returns at her request. She had a surveillance colonoscopy 07/12/20 for a strong family history of colon cancer.  Brother (age 25), maternal aunt (57s), and maternal uncle (age 72s) with colon cancer.    History of metastatic breast cancer treated with chemotherapy, mastectomy, and radiation.  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  She has stopped Plavix in preparation for prior endoscopic evaluation.  She has multiple complaints today: Reports a 3 months history of generalized, non-radiating abdominal pain that has become more localized to the epigastrium, altered bowel habits, bloating, and gas. There has been some leakage of gas and stool. Seemed to develop after her colonoscopy. She is also concerned about a right inguinal hernia that Dr. Jerilee Hoh identified.  No identified food triggers. Appetite is good. Weight stable if not an increase in 7-8 pounds. Concerned about a  new increased abdominal girth.   Has had 4 BM daily, with some noted leaking espeically if she passes gas. Denies the use of stool softeners, laxatives, or fiber supplements.   Symptoms not improving with GasEx.   Abdominal ultrasound was normal 07/21/20 Normal CBC 08/17/20   Endoscopic history: - Normal colonoscopy in 2000 - Two small tubular adenomas removed on colonoscopy 06/2009. - A small descending tubular adenoma was removed on colonoscopy 02/10/2015.   - TA, SSP, and hyperplastic polyp on colonoscopy 07/12/20  She has rare itching attributed to hemorrhoids. GI ROS is otherwise negative.     Past Medical History:  Diagnosis Date  . Anemia   . Breast cancer ([place]) 09/2012   right breast/right axillary lymph node t2,pn1, stage 11b, invasive ductal carcinma, grade 3, triple negative, with an mib-1 of 15%  . Cataract    bilateral removed  . Cerebral aneurysm, nonruptured   . Cerebrovascular disease, unspecified   . Colonic polyp   . Dizziness    pt. states that she experiences this occassionally 05/03/15  . Headache    occassionally  . Heart murmur   . History of chemotherapy   . Hx of radiation therapy 05/27/13-08/13/13   right chest wall/right supraclavicular/axillary region 5220 cGy 29 sessions, right mastectomy/chest wall boost cGy 5 sessions  . Hypertension    Does not see a cardiologist  . Hypothyroidism   . Neuropathy   . Pure hypercholesterolemia   . Rosacea   . Seasonal allergies   . Stroke [facility]) 2006   no deficits - TIA  . TIA (transient ischemic  attack)   . Wears glasses     Past Surgical History:  Procedure Laterality Date  . BREAST BIOPSY Right 10/23/2012  . BREAST BIOPSY Right 10/08/2012  . BREAST BIOPSY Left 08/13/2019   REACTIVE LYMPH NODE WITH FOLLICULAR HYPERPLASIA  . COLONOSCOPY    . COLONOSCOPY W/ POLYPECTOMY    . ESOPHAGOGASTRODUODENOSCOPY    . Left middle cerebral artery angioplasty  2004   by [name] had 2 follow up occurances  .  MASTECTOMY Right   . MASTECTOMY W/ SENTINEL NODE BIOPSY Right 04/23/2013   Procedure: RIGHT TOTAL MASTECTOMY WITH SENTINEL LYMPH NODE BIOPSY;  Surgeon: [name], MD;  Location: [place];  Service: General;  Laterality: Right;  . PORTACATH PLACEMENT N/A 11/05/2012   Procedure: INSERTION PORT-A-CATH;  Surgeon: [name], MD;  Location: [place];  Service: General;  Laterality: N/A;  . RADIOLOGY WITH ANESTHESIA N/A 05/04/2015   Procedure: MRI BRAIN WITH AND WITHOUT;  Surgeon: Medication Radiologist, MD;  Location: [place];  Service: Radiology;  Laterality: N/A;    Current Outpatient Medications  Medication Sig Dispense Refill  . Calcium Carbonate (CALTRATE 600 PO) Take by mouth 2 (two) times daily.    . clopidogrel (PLAVIX) 75 MG tablet TAKE 1 TABLET BY MOUTH DAILY 90 tablet 1  . levothyroxine (SYNTHROID) 137 MCG tablet TAKE 1 TABLET(137 MCG) BY MOUTH DAILY 90 tablet 1  . lisinopril (ZESTRIL) 10 MG tablet Take 10 mg by mouth daily.    . metoprolol succinate (TOPROL-XL) 100 MG 24 hr tablet TAKE 1 TABLET BY MOUTH EVERY DAY WITH OR IMMEDIATELY FOLLOWING A MEAL 90 tablet 1  . Multiple Vitamin (MULTIVITAMIN WITH MINERALS) TABS tablet Take 1 tablet by mouth every morning. Centrum Silver    . rosuvastatin (CRESTOR) 20 MG tablet TAKE 1 TABLET BY MOUTH DAILY 90 tablet 0  . sertraline (ZOLOFT) 100 MG tablet TAKE 1 TABLET(100 MG) BY MOUTH DAILY 90 tablet 1   No current facility-administered medications for this visit.    Allergies as of 10/19/2020  . (No Known Allergies)    Family History  Problem Relation Age of Onset  . Lung cancer Mother 13  . Heart disease Father   . Colon cancer Brother 54  . Alcohol abuse Brother   . Colon cancer Maternal Uncle        dx in his 7s  . Colon cancer Maternal Aunt   . Ovarian cancer Other 60  . Lung cancer Maternal Aunt   . Breast cancer Sister   . Rectal cancer Neg Hx   . Stomach cancer Neg Hx   . Esophageal cancer Neg Hx        Physical Exam: General:   Alert,  well-nourished, pleasant and cooperative in NAD Head:  Normocephalic and atraumatic. Eyes:  Sclera clear, no icterus.   Conjunctiva pink. Ears:  Normal auditory acuity. Nose:  No deformity, discharge,  or lesions. Mouth:  No deformity or lesions.   Neck:  Supple; no masses or thyromegaly. Lungs:  Clear throughout to auscultation.   No wheezes. Heart:  Regular rate and rhythm; no murmurs. Abdomen:  Soft, nontender, nondistended, normal bowel sounds, no rebound or guarding. No hepatosplenomegaly.   Rectal:  Deferred  Msk:  Symmetrical. No boney deformities LAD: No inguinal or umbilical LAD Extremities:  No clubbing or edema. Neurologic:  Alert and  oriented x4;  grossly nonfocal Skin:  Intact without significant lesions or rashes. Psych:  Alert and cooperative. Normal mood and affect.    Joelene Millin  Jovita Kussmaul, MD, MPH 10/19/2020, 3:09 PM

## 2020-10-19 NOTE — Patient Instructions (Addendum)
It was a pleasure to see you today. Based on our discussion, I am providing you with my recommendations below:  RECOMMENDATION(S):   I am recommending labs to better evaluate your symptoms  I would like for you to add a daily dose of Metamucil or Benefiber and to trail Xifaxan as written below.  To better evaluate your inguinal hernia, I would like for you to be seen by a General Surgeon.  Please plan to follow up with me in 4 weeks as indicated below  REFERRAL:  . A referral, your demographics, a copy of your insurance card and your records will be sent to Dr. Rosendo Gros with [facility]. You will receive a call from their office regarding the date, time and location of your appointment.  PRESCRIPTION MEDICATION(S):   We have sent the following medication(s) to your pharmacy:  . Doreene Nest - please take 550mg  by mouth 3 times daily for 14 days  NOTE: If your medication(s) requires a PRIOR AUTHORIZATION, we will receive notification from your pharmacy. Once received, the process to submit for approval may take up to 7-10 business days. You will be contacted about any denials we have received from your insurance company as well as alternatives recommended by your provider.  LABS:   . Please proceed to the basement level for lab work before leaving today. Press "B" on the elevator. The lab is located at the first door on the left as you exit the elevator.  HEALTHCARE LAWS AND MY CHART RESULTS:   . Due to recent changes in healthcare laws, you may see results of your imaging and/or laboratory studies on MyChart before I have had a chance to review them.  I understand that in some cases there may be results that are confusing or concerning to you. Please understand that not all results are received at same time and often I may need to interpret multiple results in order to provide you with the best plan of care or course of treatment. Therefore, I ask that you please give me 48 hours  to thoroughly review all your results before contacting my office for clarification.   FOLLOW UP:  . I would like for you to follow up with me in 3-4 weeks. Your appointment has been scheduled on 11/30/20 @ 8:30am. If you are unable to make this appointment, please call the office at [phone] to reschedule your appointment.  BMI:  . If you are age 78 or older, your body mass index should be between 23-30. Your Body mass index is 25.87 kg/m. If this is out of the aforementioned range listed, please consider follow up with your Primary Care Provider.  Thank you for trusting me with your gastrointestinal care!    Thornton Park, MD, MPH  Psyllium granules or powder for solution What is this medicine? PSYLLIUM (SIL i yum) is a bulk-forming fiber laxative. This medicine is used to treat constipation. Increasing fiber in the diet may also help lower cholesterol and promote heart health for some people. This medicine may be used for other purposes; ask your health care provider or pharmacist if you have questions. COMMON BRAND NAME(S): Fiber Therapy, GenFiber, Geri-Mucil, Hydrocil, Konsyl, Metamucil, Metamucil MultiHealth, Mucilin, Natural Fiber Therapy, Reguloid What should I tell my health care provider before I take this medicine? They need to know if you have any of these conditions:  blockage in your bowel  difficulty swallowing  inflammatory bowel disease  phenylketonuria  stomach or intestine problems  sudden change  in bowel habits lasting more than 2 weeks  an unusual or allergic reaction to psyllium, other medicines, dyes, or preservatives  pregnant or trying or get pregnant  breast-feeding How should I use this medicine? Mix this medicine into a full glass (240 mL) of water or other cool drink. Take this medicine by mouth. Follow the directions on the package labeling, or take as directed by your health care professional. Take your medicine at regular intervals. Do  not take your medicine more often than directed. Talk to your pediatrician regarding the use of this medicine in children. While this drug may be prescribed for children as young as 59 years old for selected conditions, precautions do apply. Overdosage: If you think you have taken too much of this medicine contact a poison control center or emergency room at once. NOTE: This medicine is only for you. Do not share this medicine with others. What if I miss a dose? If you miss a dose, take it as soon as you can. If it is almost time for your next dose, take only that dose. Do not take double or extra doses. What may interact with this medicine? Interactions are not expected. Take this product at least 2 hours before or after other medicines. This list may not describe all possible interactions. Give your health care provider a list of all the medicines, herbs, non-prescription drugs, or dietary supplements you use. Also tell them if you smoke, drink alcohol, or use illegal drugs. Some items may interact with your medicine. What should I watch for while using this medicine? Check with your doctor or health care professional if your symptoms do not start to get better or if they get worse. Stop using this medicine and contact your doctor or health care professional if you have rectal bleeding or if you have to treat your constipation for more than 1 week. These could be signs of a more serious condition. Drink several glasses of water a day while you are taking this medicine. This will help to relieve constipation and prevent dehydration. What side effects may I notice from receiving this medicine? Side effects that you should report to your doctor or health care professional as soon as possible:  allergic reactions like skin rash, itching or hives, swelling of the face, lips, or tongue  breathing problems  chest pain  nausea, vomiting  rectal bleeding  trouble swallowing Side effects that  usually do not require medical attention (report to your doctor or health care professional if they continue or are bothersome):  bloating  gas  stomach cramps This list may not describe all possible side effects. Call your doctor for medical advice about side effects. You may report side effects to FDA at 1-800-FDA-1088. Where should I keep my medicine? Keep out of the reach of children. Store at room temperature between 15 and 30 degrees C (59 and 86 degrees F). Protect from moisture. Throw away any unused medicine after the expiration date. NOTE: This sheet is a summary. It may not cover all possible information. If you have questions about this medicine, talk to your doctor, pharmacist, or health care provider.  2021 Elsevier/Gold Standard (2017-12-25 15:41:08)

## 2020-10-19 NOTE — Progress Notes (Signed)
REFERRALTO [facility]:  Chart Review Routing History Since 10/21/2019 (View Full Routing History)  Recipients Sent On Sent By Routed Reports  Ralene Ok, MD   10/19/2020 3:48 PM Aleatha Borer, LPN Surgical - Auth/Cert # [id number] (Authorized)     Cover Page Message : Please schedule for evaluation and treatment of R inguinal hernia      Ralene Ok, MD   10/19/2020 3:47 PM Aleatha Borer, LPN Patient Demo/Insurance, Demographics/Insurance Info     Cover Page Message : Please schedule for evaluation and treatment of right inguinal hernia      Ralene Ok, MD   10/19/2020 3:46 PM Aleatha Borer, LPN Office Visit on 08/16/2438 with Thornton Park, MD     Cover Page Message : Please schedule pt for evaluation and treatment of Right Inguinal hernia       .

## 2020-10-21 ENCOUNTER — Other Ambulatory Visit: Payer: Medicare Other

## 2020-10-21 DIAGNOSIS — R140 Abdominal distension (gaseous): Secondary | ICD-10-CM | POA: Diagnosis not present

## 2020-10-21 DIAGNOSIS — K4090 Unilateral inguinal hernia, without obstruction or gangrene, not specified as recurrent: Secondary | ICD-10-CM

## 2020-10-21 DIAGNOSIS — R194 Change in bowel habit: Secondary | ICD-10-CM | POA: Diagnosis not present

## 2020-10-21 DIAGNOSIS — E785 Hyperlipidemia, unspecified: Secondary | ICD-10-CM

## 2020-10-21 DIAGNOSIS — R1084 Generalized abdominal pain: Secondary | ICD-10-CM | POA: Diagnosis not present

## 2020-10-21 DIAGNOSIS — R197 Diarrhea, unspecified: Secondary | ICD-10-CM | POA: Diagnosis not present

## 2020-10-22 ENCOUNTER — Telehealth: Payer: Self-pay

## 2020-10-22 NOTE — Telephone Encounter (Signed)
PRIOR AUTHORIZATION  PA initiation date: 10/22/20  Medication: [place]: [employer] completed electronically through [employer] My Meds: Yes  Will await insurance response re: approval/denial.  Linde Gillis (Key: [id number])  [employer] is reviewing your PA request. Typically an electronic response will be received within 72 hours. To check for an update later, open this request from your dashboard.  You may close this dialog and return to your dashboard to perform other tasks.  Linde Gillis (Key: [id number]) Rx #: [id number] Xifaxan 550MG  tablets   Form [employer] Medicare Part D Electronic Prior Authorization Form (2017 NCPDP) Created 3 days ago Sent to Plan 3 minutes ago Plan Response 3 minutes ago Submit Clinical Questions 2 minutes ago Determination Wait for Determination Please wait for [employer] Medicare 2017 NCPDP to return a determination.

## 2020-10-22 NOTE — Telephone Encounter (Signed)
APPROVAL  Medication: [employer]: [employer] PA response: APPROVED Approval dates: 10/22/20 through 11/05/20 Misc. Notes: As written below  Debra Mcintyre (Key: [id number])  This request has received a Favorable outcome.  Please note any additional information provided by [employer] at the bottom of your screen.  Debra Mcintyre (Key: [id number]) Rx #: [id number] Xifaxan 550MG  tablets   Form [employer] Medicare Part D Electronic Prior Authorization Form (2017 NCPDP) Created 3 days ago Sent to Plan 7 minutes ago Plan Response 7 minutes ago Submit Clinical Questions 7 minutes ago Determination Favorable 4 minutes ago Message from Plan Request Reference Number: [id number]. XIFAXAN TAB 550MG  is approved through 11/05/2020. Your patient may now fill this prescription and it will be covered.

## 2020-10-25 ENCOUNTER — Other Ambulatory Visit: Payer: Self-pay

## 2020-10-25 LAB — GI PROFILE, STOOL, PCR

## 2020-10-25 LAB — CALPROTECTIN, FECAL: Calprotectin, Fecal: 21 ug/g (ref 0–120)

## 2020-10-25 MED ORDER — DOXYCYCLINE MONOHYDRATE 100 MG PO TABS: 100.0000 mg | ORAL_TABLET | Freq: Two times a day (BID) | ORAL | 0 refills | Status: DC

## 2020-10-28 ENCOUNTER — Other Ambulatory Visit: Payer: Self-pay | Admitting: Internal Medicine

## 2020-10-28 DIAGNOSIS — E039 Hypothyroidism, unspecified: Secondary | ICD-10-CM

## 2020-10-28 LAB — PANCREATIC ELASTASE, FECAL: Pancreatic Elastase-1, Stool: >500 mcg/g

## 2020-10-29 ENCOUNTER — Encounter: Payer: Self-pay | Admitting: Gastroenterology

## 2020-11-04 ENCOUNTER — Other Ambulatory Visit: Payer: Self-pay | Admitting: Internal Medicine

## 2020-11-12 ENCOUNTER — Telehealth: Payer: Self-pay | Admitting: Gastroenterology

## 2020-11-12 NOTE — Telephone Encounter (Signed)
Noted  

## 2020-11-12 NOTE — Progress Notes (Signed)
SECOND ATTEMPT:  Called [facility] to f/u on status of referral. States pt has not been scheduled for NP appt. Requested all information be again faxed.   Chart Review Routing History Since 11/14/2019 [facility] Full Routing History)  Recipients Sent On Sent By Routed Reports  Ralene Ok, MD   11/12/2020 10:44 AM Aleatha Borer, LPN Surgical - Auth/Cert # [id number] (Authorized)      Ralene Ok, MD   11/12/2020 10:43 AM Aleatha Borer, LPN Patient Demo/Insurance, Demographics/Insurance Info      Ralene Ok, MD   11/12/2020 10:41 AM Aleatha Borer, LPN Office Visit on 6/0/6770 with Thornton Park, MD

## 2020-11-30 ENCOUNTER — Ambulatory Visit: Payer: Medicare Other | Admitting: Gastroenterology

## 2020-12-02 ENCOUNTER — Other Ambulatory Visit: Payer: Self-pay | Admitting: Internal Medicine

## 2020-12-02 DIAGNOSIS — M25561 Pain in right knee: Secondary | ICD-10-CM | POA: Diagnosis not present

## 2020-12-22 DIAGNOSIS — K4190 Unilateral femoral hernia, without obstruction or gangrene, not specified as recurrent: Secondary | ICD-10-CM | POA: Diagnosis not present

## 2020-12-27 ENCOUNTER — Other Ambulatory Visit: Payer: Self-pay | Admitting: Internal Medicine

## 2020-12-28 ENCOUNTER — Other Ambulatory Visit: Payer: Self-pay | Admitting: Internal Medicine

## 2021-01-24 ENCOUNTER — Ambulatory Visit (INDEPENDENT_AMBULATORY_CARE_PROVIDER_SITE_OTHER): Payer: Medicare Other | Admitting: Gastroenterology

## 2021-01-24 ENCOUNTER — Encounter: Payer: Self-pay | Admitting: Gastroenterology

## 2021-01-24 VITALS — BP 104/52 | HR 82 | Ht 68.0 in | Wt 162.0 lb

## 2021-01-24 DIAGNOSIS — K4090 Unilateral inguinal hernia, without obstruction or gangrene, not specified as recurrent: Secondary | ICD-10-CM | POA: Diagnosis not present

## 2021-01-24 DIAGNOSIS — R140 Abdominal distension (gaseous): Secondary | ICD-10-CM

## 2021-01-24 DIAGNOSIS — R194 Change in bowel habit: Secondary | ICD-10-CM | POA: Diagnosis not present

## 2021-01-24 NOTE — Progress Notes (Signed)
Referring Provider: Isaac Bliss, [place] [facility]* Primary Care Physician:  Isaac Bliss, Rayford Halsted, MD  Chief complaint:  Change in bowel habits  IMPRESSION:  Change in bowel habits with bloat and leakage    - developed after colonoscopy 06/2020    - suspected SIBO given the response to Xifaxan TID x 14 days Right inguinal hernia History of colon polyps    - Normal colonoscopy 2000    - 2 small tubular adenomas 2010    - One small tubular adenoma 2016    - Tubular adenoma, sessile serrated polyp, hyperplastic polyp 07/12/20    - no surveillance recommended given age Family history of colon cancer (brother, maternal aunt, maternal uncle)  PLAN: - Continue daily psyllium or methylcellulose - Trial of Xifaxan 550 mg TID x 14 days for possible SIBO with any recurrent symptoms - Follow-up with surgery re: inguinal hernia - Follow-up in this office as needed  I spent 30 minutes, including in depth chart review, independent review of results, communicating results with the patient directly, face-to-face time with the patient, coordinating care, and documentation.   HPI: Debra Mcintyre returns in scheduled follow-up. She had a surveillance colonoscopy 07/12/20 for a strong family history of colon cancer.  Brother (age 29), maternal aunt (38s), and maternal uncle (age 12s) with colon cancer.    History of metastatic breast cancer treated with chemotherapy, mastectomy, and radiation.  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  She has stopped Plavix in preparation for prior endoscopic evaluation.  At the time of her last office visit 10/19/20 she had multiple complaints: Reports a 3 months history of generalized, non-radiating abdominal pain that has become more localized to the epigastrium, altered bowel habits, bloating, and gas. There has been some leakage of gas and stool. Seemed to develop after her colonoscopy. She is also concerned about  a right inguinal hernia that Dr. Jerilee Hoh identified.  No identified food triggers. Appetite is good. Weight stable if not an increase in 7-8 pounds. Concerned about a new increased abdominal girth. Was having 4 BM daily, with some noted leaking espeically if she passes gas. Denies the use of stool softeners, laxatives, or fiber supplements. Symptoms not improving with GasEx.   Returns today in scheduled follow-up.  Gas, leakage, and stool texture have normalized/resolved after 14 days of Xifaxan 550mg  TID. Now using Metamucil PRN.  She is overall pleased with her response to therapy.   Upcoming surgery with Dr. Rosendo Gros for hernia repair after her husband has fully recovered.   Recent evaluation: - Abdominal ultrasound was normal 07/21/20 - Normal CBC 08/17/20 - Normal fecal calprotectin (21), GI pathogen panel, pancreatic elastase (>500), CRP; TSH was 0/34   Endoscopic history: - Normal colonoscopy in 2000 - Two small tubular adenomas removed on colonoscopy 06/2009. - A small descending tubular adenoma was removed on colonoscopy 02/10/2015.   - TA, SSP, and hyperplastic polyp on colonoscopy 07/12/20  She has a history of rare itching attributed to hemorrhoids. No new complaints or concerns today. GI ROS is otherwise negative.     Past Medical History:  Diagnosis Date   Anemia    Breast cancer ([place]) 09/2012   right breast/right axillary lymph node t2,pn1, stage 11b, invasive ductal carcinma, grade 3, triple negative, with an mib-1 of 15%   Cataract    bilateral removed   Cerebral aneurysm, nonruptured    Cerebrovascular disease, unspecified    Colonic polyp    Dizziness  pt. states that she experiences this occassionally 05/03/15   Headache    occassionally   Heart murmur    History of chemotherapy    Hx of radiation therapy 05/27/13-08/13/13   right chest wall/right supraclavicular/axillary region 5220 cGy 29 sessions, right mastectomy/chest wall boost cGy 5 sessions    Hypertension    Does not see a cardiologist   Hypothyroidism    Inguinal hernia    Neuropathy    Pure hypercholesterolemia    Rosacea    Seasonal allergies    Stroke ([place]) 2006   no deficits - TIA   TIA (transient ischemic attack)    Wears glasses     Past Surgical History:  Procedure Laterality Date   BREAST BIOPSY Right 10/23/2012   BREAST BIOPSY Right 10/08/2012   BREAST BIOPSY Left 08/13/2019   REACTIVE LYMPH NODE WITH FOLLICULAR HYPERPLASIA   COLONOSCOPY     COLONOSCOPY W/ POLYPECTOMY     ESOPHAGOGASTRODUODENOSCOPY     Left middle cerebral artery angioplasty  2004   by [name] had 2 follow up occurances   MASTECTOMY Right    MASTECTOMY W/ SENTINEL NODE BIOPSY Right 04/23/2013   Procedure: RIGHT TOTAL MASTECTOMY WITH SENTINEL LYMPH NODE BIOPSY;  Surgeon: [name], MD;  Location: [place];  Service: General;  Laterality: Right;   PORTACATH PLACEMENT N/A 11/05/2012   Procedure: INSERTION PORT-A-CATH;  Surgeon: [name], MD;  Location: [place];  Service: General;  Laterality: N/A;   RADIOLOGY WITH ANESTHESIA N/A 05/04/2015   Procedure: MRI BRAIN WITH AND WITHOUT;  Surgeon: Medication Radiologist, MD;  Location: [place];  Service: Radiology;  Laterality: N/A;    Current Outpatient Medications  Medication Sig Dispense Refill   acetaminophen (TYLENOL) 325 MG tablet Take 650 mg by mouth every 6 (six) hours as needed.     Calcium Carbonate (CALTRATE 600 PO) Take by mouth 2 (two) times daily.     clopidogrel (PLAVIX) 75 MG tablet TAKE 1 TABLET BY MOUTH DAILY 90 tablet 1   levothyroxine (SYNTHROID) 137 MCG tablet TAKE 1 TABLET(137 MCG) BY MOUTH DAILY 90 tablet 1   lisinopril (ZESTRIL) 20 MG tablet TAKE 1 TABLET(20 MG) BY MOUTH DAILY (Patient taking differently: Take 10 mg by mouth daily.) 90 tablet 1   metoprolol succinate (TOPROL-XL) 100 MG 24 hr tablet TAKE 1 TABLET BY MOUTH EVERY DAY WITH OR IMMEDIATELY FOLLOWING A MEAL 90 tablet 1   Multiple  Vitamin (MULTIVITAMIN WITH MINERALS) TABS tablet Take 1 tablet by mouth every morning. Centrum Silver     psyllium (METAMUCIL) 58.6 % powder Take 1 packet by mouth as needed.     rosuvastatin (CRESTOR) 20 MG tablet TAKE 1 TABLET BY MOUTH DAILY 90 tablet 0   sertraline (ZOLOFT) 100 MG tablet TAKE 1 TABLET(100 MG) BY MOUTH DAILY 90 tablet 1   No current facility-administered medications for this visit.    Allergies as of 01/24/2021   (No Known Allergies)      Physical Exam: General:   Alert,  well-nourished, pleasant and cooperative in NAD Head:  Normocephalic and atraumatic. Eyes:  Sclera clear, no icterus.   Conjunctiva pink. Abdomen:  Soft, nontender, nondistended, normal bowel sounds, no rebound or guarding. No hepatosplenomegaly.   Neurologic:  Alert and  oriented x4;  grossly nonfocal Skin:  Intact without significant lesions or rashes. Psych:  Alert and cooperative. Normal mood and affect.    Benjamine Strout L. Tarri Glenn, MD, MPH 01/24/2021, 10:18 AM

## 2021-01-24 NOTE — Patient Instructions (Addendum)
It was a pleasure to see you today. Based on our discussion, I am providing you with my recommendations below:  RECOMMENDATION(S):   FOLLOW UP:  I would like for you to follow up with me as needed. Please call the office at [phone] to schedule your appointment.  BMI:  If you are age 78 or older, your body mass index should be between 23-30. Your Body mass index is 24.63 kg/m. If this is out of the aforementioned range listed, please consider follow up with your Primary Care Provider.  MY CHART:  The [facility] providers would like to encourage you to use [facility] to communicate with providers for non-urgent requests or questions.  Due to long hold times on the telephone, sending your provider a message by [facility] may be a faster and more efficient way to get a response.  Please allow 48 business hours for a response.  Please remember that this is for non-urgent requests.   Thank you for trusting me with your gastrointestinal care!    Thornton Park, MD, MPH Alonza Bogus, PA-C

## 2021-01-26 DIAGNOSIS — Z23 Encounter for immunization: Secondary | ICD-10-CM | POA: Diagnosis not present

## 2021-02-01 ENCOUNTER — Other Ambulatory Visit: Payer: Self-pay | Admitting: Internal Medicine

## 2021-02-01 DIAGNOSIS — E039 Hypothyroidism, unspecified: Secondary | ICD-10-CM

## 2021-02-10 ENCOUNTER — Emergency Department (HOSPITAL_COMMUNITY)
Admission: EM | Admit: 2021-02-10 | Discharge: 2021-02-10 | Disposition: A | Discharge status: Home / Self Care | Payer: Medicare Other | Attending: Emergency Medicine | Admitting: Emergency Medicine

## 2021-02-10 ENCOUNTER — Encounter (HOSPITAL_COMMUNITY): Payer: Self-pay | Admitting: Emergency Medicine

## 2021-02-10 ENCOUNTER — Emergency Department (HOSPITAL_COMMUNITY): Payer: Medicare Other

## 2021-02-10 DIAGNOSIS — R519 Headache, unspecified: Secondary | ICD-10-CM | POA: Diagnosis not present

## 2021-02-10 DIAGNOSIS — C799 Secondary malignant neoplasm of unspecified site: Secondary | ICD-10-CM

## 2021-02-10 DIAGNOSIS — R531 Weakness: Secondary | ICD-10-CM | POA: Diagnosis not present

## 2021-02-10 DIAGNOSIS — E039 Hypothyroidism, unspecified: Secondary | ICD-10-CM | POA: Diagnosis not present

## 2021-02-10 DIAGNOSIS — Z853 Personal history of malignant neoplasm of breast: Secondary | ICD-10-CM | POA: Diagnosis not present

## 2021-02-10 DIAGNOSIS — Z87891 Personal history of nicotine dependence: Secondary | ICD-10-CM | POA: Diagnosis not present

## 2021-02-10 DIAGNOSIS — C7931 Secondary malignant neoplasm of brain: Secondary | ICD-10-CM | POA: Diagnosis not present

## 2021-02-10 DIAGNOSIS — Z7902 Long term (current) use of antithrombotics/antiplatelets: Secondary | ICD-10-CM | POA: Diagnosis not present

## 2021-02-10 DIAGNOSIS — I10 Essential (primary) hypertension: Secondary | ICD-10-CM | POA: Insufficient documentation

## 2021-02-10 DIAGNOSIS — R29810 Facial weakness: Secondary | ICD-10-CM | POA: Diagnosis present

## 2021-02-10 DIAGNOSIS — G510 Bell's palsy: Secondary | ICD-10-CM | POA: Insufficient documentation

## 2021-02-10 DIAGNOSIS — Z79899 Other long term (current) drug therapy: Secondary | ICD-10-CM | POA: Insufficient documentation

## 2021-02-10 DIAGNOSIS — I639 Cerebral infarction, unspecified: Secondary | ICD-10-CM | POA: Insufficient documentation

## 2021-02-10 DIAGNOSIS — Y900 Blood alcohol level of less than 20 mg/100 ml: Secondary | ICD-10-CM | POA: Diagnosis not present

## 2021-02-10 DIAGNOSIS — J45909 Unspecified asthma, uncomplicated: Secondary | ICD-10-CM | POA: Insufficient documentation

## 2021-02-10 DIAGNOSIS — G9389 Other specified disorders of brain: Secondary | ICD-10-CM | POA: Diagnosis not present

## 2021-02-10 LAB — CBC
HCT: 42.1 % (ref 36.0–46.0)
Hemoglobin: 13.7 g/dL (ref 12.0–15.0)
MCH: 28.8 pg (ref 26.0–34.0)
MCHC: 32.5 g/dL (ref 30.0–36.0)
MCV: 88.4 fL (ref 80.0–100.0)
Platelets: UNDETERMINED 10*3/uL (ref 150–400)
RBC: 4.76 MIL/uL (ref 3.87–5.11)
RDW: 13.1 % (ref 11.5–15.5)
WBC: 6.4 10*3/uL (ref 4.0–10.5)
nRBC: 0 % (ref 0.0–0.2)

## 2021-02-10 LAB — CBG MONITORING, ED: Glucose-Capillary: 158 mg/dL — ABNORMAL HIGH (ref 70–99)

## 2021-02-10 LAB — I-STAT CHEM 8, ED
BUN: 22 mg/dL (ref 8–23)
Calcium, Ion: 1.29 mmol/L (ref 1.15–1.40)
Chloride: 103 mmol/L (ref 98–111)
Creatinine, Ser: 0.7 mg/dL (ref 0.44–1.00)
Glucose, Bld: 116 mg/dL — ABNORMAL HIGH (ref 70–99)
HCT: 41 % (ref 36.0–46.0)
Hemoglobin: 13.9 g/dL (ref 12.0–15.0)
Potassium: 4.3 mmol/L (ref 3.5–5.1)
Sodium: 137 mmol/L (ref 135–145)
TCO2: 24 mmol/L (ref 22–32)

## 2021-02-10 LAB — APTT: aPTT: 25 seconds (ref 24–36)

## 2021-02-10 LAB — COMPREHENSIVE METABOLIC PANEL
ALT: 19 U/L (ref 0–44)
AST: 27 U/L (ref 15–41)
Albumin: 4.1 g/dL (ref 3.5–5.0)
Alkaline Phosphatase: 46 U/L (ref 38–126)
Anion gap: 11 (ref 5–15)
BUN: 20 mg/dL (ref 8–23)
CO2: 22 mmol/L (ref 22–32)
Calcium: 9.7 mg/dL (ref 8.9–10.3)
Chloride: 102 mmol/L (ref 98–111)
Creatinine, Ser: 0.8 mg/dL (ref 0.44–1.00)
GFR, Estimated: >60 mL/min (ref >60)
Glucose, Bld: 124 mg/dL — ABNORMAL HIGH (ref 70–99)
Potassium: 4.4 mmol/L (ref 3.5–5.1)
Sodium: 135 mmol/L (ref 135–145)
Total Bilirubin: 1 mg/dL (ref 0.3–1.2)
Total Protein: 7.1 g/dL (ref 6.5–8.1)

## 2021-02-10 LAB — DIFFERENTIAL
Abs Immature Granulocytes: 0.01 10*3/uL (ref 0.00–0.07)
Basophils Absolute: 0.1 10*3/uL (ref 0.0–0.1)
Basophils Relative: 1 %
Eosinophils Absolute: 0.2 10*3/uL (ref 0.0–0.5)
Eosinophils Relative: 2 %
Immature Granulocytes: 0 %
Lymphocytes Relative: 25 %
Lymphs Abs: 1.6 10*3/uL (ref 0.7–4.0)
Monocytes Absolute: 0.6 10*3/uL (ref 0.1–1.0)
Monocytes Relative: 9 %
Neutro Abs: 4 10*3/uL (ref 1.7–7.7)
Neutrophils Relative %: 63 %

## 2021-02-10 LAB — PROTIME-INR
INR: 1 (ref 0.8–1.2)
Prothrombin Time: 13.3 seconds (ref 11.4–15.2)

## 2021-02-10 LAB — ETHANOL: Alcohol, Ethyl (B): <10 mg/dL (ref <10)

## 2021-02-10 MED ORDER — DEXAMETHASONE 4 MG PO TABS: 4.0000 mg | ORAL_TABLET | Freq: Every day | ORAL | 0 refills | Status: DC

## 2021-02-10 MED ORDER — GADOBUTROL 1 MMOL/ML IV SOLN
7.0000 mL | Freq: Once | INTRAVENOUS | Status: AC | PRN
  Administered 2021-02-10: 7 mL via INTRAVENOUS

## 2021-02-10 MED ORDER — DEXAMETHASONE 4 MG PO TABS
4.0000 mg | ORAL_TABLET | Freq: Once | ORAL | Status: AC
  Administered 2021-02-10: 4 mg via ORAL
  Filled 2021-02-10: qty 1

## 2021-02-10 NOTE — ED Notes (Signed)
Updated on wait. 

## 2021-02-10 NOTE — ED Provider Notes (Signed)
80 year old female I received in signout from Dr. Tomi Bamberger, briefly she woke up this morning with left-sided facial nerve palsy.  Likely Bell's based on exam plan for MRI.  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  I discussed the case with Dr. Mickeal Skinner, neuro oncology recommended starting the patient on 4 mg dexamethasone daily and he would have the tumor board convene and the patient would be taken care of as an outpatient.  I discussed this with the patient.  We will give a dose of Decadron here.  Discharge home.   Deno Etienne, DO 02/10/21 [phone]

## 2021-02-10 NOTE — Discharge Instructions (Addendum)
I discussed the case with the neuro oncologist that is on-call.  He has taken January information and will meet with the tumor board to discuss possible modalities of treatment.  They plan to call you to set up an appointment.  He was going to let your oncologist know.  Please return to the emergency department for worsening one-sided weakness difficulty with speech or swallowing.  As you are having some issues with your left eye you should follow-up with an ophthalmologist and you should use lubricating eye drops.  Usually it is recommended to use ones that are preservative-free and then he can use it as often as you need to but should throw it away at the end of the day.  If you feel like you are unable to close it fully at night then you may need to tape it shut.

## 2021-02-10 NOTE — ED Provider Notes (Addendum)
Emergency Medicine Provider Triage Evaluation Note  Debra Mcintyre , a 80 y.o. female  was evaluated in triage.  Pt complains of facial asymmetry and weakness to left upper eyelid.  Patient reports that she woke up this morning at 1000 with the symptoms.  Patient went to bed last night at 2300 without any of these symptoms.  Patient reports that she has had a headache and ringing in her ears over the last week.  Headache has been constant.  Pain is in her ear exposed yesterday evening.  Patient is on Plavix.  Patient last took this medication last night.  Denies any recent falls or injuries  Review of Systems  Positive: Facial symmetry, headache, tinnitus Negative: Fevers, chills, numbness, weakness, seizures, syncope  Physical Exam  BP 133/74 (BP Location: Left Arm)   Pulse 82   Temp 97.9 F (36.6 C) (Oral)   Resp 15   SpO2 92%  Gen:   Awake, no distress   Resp:  Normal effort  MSK:   Moves extremities without difficulty  Other:  Patient has please refer to left side, weakness to left upper eyelid.  + Strength to bilateral upper and lower extremities.  Sensation to light touch intact bilateral upper and lower extremities. EOM Intact bilaterally.  Pupils PERRL.  No pronator drift.  Medical Decision Making  Medically screening exam initiated at 12:22 PM.  Appropriate orders placed.  Monchel Pollitt was informed that the remainder of the evaluation will be completed by another provider, this initial triage assessment does not replace that evaluation, and the importance of remaining in the ED until their evaluation is complete.  Patient is outside stroke window due to last known normal being 2300.  Patient is Plavix making her a poor candidate for tPA.  Will move back to next room   Loni Beckwith, PA-C 02/10/21 1228    Loni Beckwith, PA-C 02/10/21 1336    Dorie Rank, MD 02/11/21 [phone]

## 2021-02-10 NOTE — ED Provider Notes (Addendum)
[facility] EMERGENCY DEPARTMENT Provider Note   CSN: [id number] Arrival date & time: 02/10/21  1209     History Chief Complaint  Patient presents with   Facial Droop    Debra Mcintyre is a 80 y.o. female.  HPI  Patient presents to the ED for evaluation of facial droop.  Patient states she started noticing symptoms when she woke up this morning at about 10 AM.  Last night she did not notice any difficulties.  This morning she noticed that her smile was asymmetric.  The left side of her face is drooping.  She is having difficulty closing her eye.  She did have a mild headache this past week and had some ringing in her ears.  She also feels like her vision is a little bit off.  Her left eye does feel dry.  Past Medical History:  Diagnosis Date   Anemia    Breast cancer ([place]) 09/2012   right breast/right axillary lymph node t2,pn1, stage 11b, invasive ductal carcinma, grade 3, triple negative, with an mib-1 of 15%   Cataract    bilateral removed   Cerebral aneurysm, nonruptured    Cerebrovascular disease, unspecified    Colonic polyp    Dizziness    pt. states that she experiences this occassionally 05/03/15   Headache    occassionally   Heart murmur    History of chemotherapy    Hx of radiation therapy 05/27/13-08/13/13   right chest wall/right supraclavicular/axillary region 5220 cGy 29 sessions, right mastectomy/chest wall boost cGy 5 sessions   Hypertension    Does not see a cardiologist   Hypothyroidism    Inguinal hernia    Neuropathy    Pure hypercholesterolemia    Rosacea    Seasonal allergies    Stroke ([place]) 2006   no deficits - TIA   TIA (transient ischemic attack)    Wears glasses     Patient Active Problem List   Diagnosis Date Noted   Malignant neoplasm of upper-outer quadrant of right breast in female, estrogen receptor negative ([place]) 05/13/2013   Anemia, unspecified 02/24/2013   GERD (gastroesophageal reflux disease)  12/16/2012   Asthmatic bronchitis 04/21/2011   INTRACRANIAL ANEURYSM 08/28/2009   Cerebrovascular disease 08/28/2009   COLONIC POLYPS 08/26/2009   Essential hypertension 05/12/2008   HEMORRHOIDS 05/12/2008   Hypothyroidism 12/02/2007   HYPERCHOLESTEROLEMIA 12/02/2007   Anxiety state 12/02/2007   Transient cerebral ischemia 12/02/2007   Osteoarthritis 12/02/2007   FIBROMYALGIA 12/02/2007   Osteoporosis 12/02/2007    Past Surgical History:  Procedure Laterality Date   BREAST BIOPSY Right 10/23/2012   BREAST BIOPSY Right 10/08/2012   BREAST BIOPSY Left 08/13/2019   REACTIVE LYMPH NODE WITH FOLLICULAR HYPERPLASIA   COLONOSCOPY     COLONOSCOPY W/ POLYPECTOMY     ESOPHAGOGASTRODUODENOSCOPY     Left middle cerebral artery angioplasty  2004   by [name] had 2 follow up occurances   MASTECTOMY Right    MASTECTOMY W/ SENTINEL NODE BIOPSY Right 04/23/2013   Procedure: RIGHT TOTAL MASTECTOMY WITH SENTINEL LYMPH NODE BIOPSY;  Surgeon: [name], MD;  Location: [facility];  Service: General;  Laterality: Right;   PORTACATH PLACEMENT N/A 11/05/2012   Procedure: INSERTION PORT-A-CATH;  Surgeon: [name], MD;  Location: [place];  Service: General;  Laterality: N/A;   RADIOLOGY WITH ANESTHESIA N/A 05/04/2015   Procedure: MRI BRAIN WITH AND WITHOUT;  Surgeon: Medication Radiologist, MD;  Location: [place] [facility];  Service: Radiology;  Laterality: N/A;  OB History   No obstetric history on file.     Family History  Problem Relation Age of Onset   Lung cancer Mother 93   Heart disease Father    Colon cancer Brother 79   Alcohol abuse Brother    Colon cancer Maternal Uncle        dx in his 56s   Colon cancer Maternal Aunt    Ovarian cancer Other 47   Lung cancer Maternal Aunt    Breast cancer Sister    Rectal cancer Neg Hx    Stomach cancer Neg Hx    Esophageal cancer Neg Hx     Social History   Tobacco Use   Smoking status: Former    Packs/day: 1.00     Years: 20.00    Pack years: 20.00    Types: Cigarettes    Quit date: 04/02/1986    Years since quitting: 34.8   Smokeless tobacco: Never  Vaping Use   Vaping Use: Never used  Substance Use Topics   Alcohol use: No   Drug use: No    Home Medications Prior to Admission medications   Medication Sig Start Date End Date Taking? Authorizing Provider  acetaminophen (TYLENOL) 500 MG tablet Take 1,000 mg by mouth every 6 (six) hours as needed (for headaches).   Yes [provider]  Calcium Carbonate (CALTRATE 600 PO) Take 1 tablet by mouth in the morning and at bedtime.   Yes [provider]  clopidogrel (PLAVIX) 75 MG tablet TAKE 1 TABLET BY MOUTH DAILY Patient taking differently: Take 75 mg by mouth at bedtime. 12/28/20  Yes Isaac Bliss, Rayford Halsted, MD  levothyroxine (SYNTHROID) 137 MCG tablet TAKE 1 TABLET(137 MCG) BY MOUTH DAILY Patient taking differently: Take 137 mcg by mouth daily before breakfast. 02/01/21  Yes Isaac Bliss, Rayford Halsted, MD  lisinopril (ZESTRIL) 20 MG tablet TAKE 1 TABLET(20 MG) BY MOUTH DAILY Patient taking differently: Take 10 mg by mouth at bedtime. 11/04/20  Yes Isaac Bliss, Rayford Halsted, MD  metoprolol succinate (TOPROL-XL) 100 MG 24 hr tablet TAKE 1 TABLET BY MOUTH EVERY DAY WITH OR IMMEDIATELY FOLLOWING A MEAL Patient taking differently: Take 100 mg by mouth at bedtime. 07/23/20  Yes Isaac Bliss, Rayford Halsted, MD  Multiple Vitamins-Minerals (CENTRUM SILVER ULTRA WOMENS) TABS Take 1 tablet by mouth daily with breakfast.   Yes [provider]  rosuvastatin (CRESTOR) 20 MG tablet TAKE 1 TABLET BY MOUTH DAILY Patient taking differently: Take 20 mg by mouth at bedtime. 12/28/20  Yes Isaac Bliss, Rayford Halsted, MD  sertraline (ZOLOFT) 100 MG tablet TAKE 1 TABLET(100 MG) BY MOUTH DAILY Patient taking differently: Take 100 mg by mouth in the morning. 12/02/20  Yes Isaac Bliss, Rayford Halsted, MD  psyllium (METAMUCIL) 58.6 % powder Take 1  packet by mouth as needed. Patient not taking: No sig reported    [provider]    Allergies    Patient has no known allergies.  Review of Systems   Review of Systems  All other systems reviewed and are negative.  Physical Exam Updated Vital Signs BP 139/72   Pulse 66   Temp 97.9 F (36.6 C) (Oral)   Resp 16   SpO2 93%   Physical Exam Vitals and nursing note reviewed.  Constitutional:      General: She is not in acute distress.    Appearance: She is well-developed.  HENT:     Head: Normocephalic and atraumatic.     Right Ear:  External ear normal.     Left Ear: External ear normal.  Eyes:     General: No scleral icterus.       Right eye: No discharge.        Left eye: No discharge.     Conjunctiva/sclera: Conjunctivae normal.  Neck:     Trachea: No tracheal deviation.  Cardiovascular:     Rate and Rhythm: Normal rate and regular rhythm.  Pulmonary:     Effort: Pulmonary effort is normal. No respiratory distress.     Breath sounds: Normal breath sounds. No stridor. No wheezing or rales.  Abdominal:     General: Bowel sounds are normal. There is no distension.     Palpations: Abdomen is soft.     Tenderness: There is no abdominal tenderness. There is no guarding or rebound.  Musculoskeletal:        General: No tenderness.     Cervical back: Neck supple.  Skin:    General: Skin is warm and dry.     Findings: No rash.  Neurological:     Mental Status: She is alert and oriented to person, place, and time.     Cranial Nerves: Cranial nerve deficit (left facial droop, forehead involved, difficulty closing eye, extraocular movements intact, tongue midline) present.     Sensory: No sensory deficit.     Motor: No abnormal muscle tone or seizure activity.     Coordination: Coordination normal.     Comments: No pronator drift bilateral upper extrem, able to hold both legs off bed for 5 seconds, sensation intact in all extremities, no visual field cuts, no left  or right sided neglect, normal finger-nose exam bilaterally, no nystagmus noted     ED Results / Procedures / Treatments   Labs (all labs ordered are listed, but only abnormal results are displayed) Labs Reviewed  COMPREHENSIVE METABOLIC PANEL - Abnormal; Notable for the following components:      Result Value   Glucose, Bld 124 (*)    All other components within normal limits  CBG MONITORING, ED - Abnormal; Notable for the following components:   Glucose-Capillary 158 (*)    All other components within normal limits  I-STAT CHEM 8, ED - Abnormal; Notable for the following components:   Glucose, Bld 116 (*)    All other components within normal limits  ETHANOL  PROTIME-INR  APTT  CBC  DIFFERENTIAL    EKG EKG Interpretation  Date/Time:  Thursday February 10 2021 12:13:38 EDT Ventricular Rate:  81 PR Interval:  180 QRS Duration: 76 QT Interval:  354 QTC Calculation: 411 R Axis:   -21 Text Interpretation: Normal sinus rhythm Low voltage QRS Cannot rule out Anterior infarct , age undetermined Abnormal ECG Poor R wave progression , new since last tracing Confirmed by Dorie Rank [phone]) on 02/10/2021 12:58:33 PM  Radiology No results found.  Procedures Procedures   Medications Ordered in ED Medications - No data to display  ED Course  I have reviewed the triage vital signs and the nursing notes.  Pertinent labs & imaging results that were available during my care of the patient were reviewed by me and considered in my medical decision making (see chart for details).  Clinical Course as of 02/10/21 1637  Thu Feb 10, 2021  1441 Laboratory tests are unremarkable [JK]    Clinical Course User Index [JK] Dorie Rank, MD   MDM Rules/Calculators/A&P  Patient presents to the ED for evaluation of left facial droop.  Patient has obvious left facial palsy.  More suspicious for Bell's palsy.  With her complaints of visual disturbance we will proceed with  MRI to rule out stroke/mass  Is possible her visual complaints are related to dry eyes from her inability to blink properly.Care turned over to Dr Tyrone Nine pending MRI result. Final Clinical Impression(s) / ED Diagnoses Final diagnoses:  Facial palsy       Dorie Rank, MD 02/10/21 [phone]

## 2021-02-10 NOTE — ED Triage Notes (Addendum)
Patient here for evaluation of facial droop and ringing of right ear that were present when the patient woke up this morning. Went to sleep at approximately 2300 last night, takes coumadin. Patient alert, oriented, and in no apparent distress at this time. Patient denies any other complaints.

## 2021-02-10 NOTE — ED Notes (Signed)
Pt not in room to obtain vitals

## 2021-02-11 ENCOUNTER — Other Ambulatory Visit: Payer: Self-pay | Admitting: Radiation Therapy

## 2021-02-11 ENCOUNTER — Telehealth: Payer: Self-pay | Admitting: Oncology

## 2021-02-11 NOTE — Telephone Encounter (Signed)
Scheduled appointment per 06/30 sch msg. Left message.

## 2021-02-16 ENCOUNTER — Other Ambulatory Visit: Payer: Self-pay | Admitting: *Deleted

## 2021-02-16 DIAGNOSIS — C7931 Secondary malignant neoplasm of brain: Secondary | ICD-10-CM | POA: Diagnosis not present

## 2021-02-16 DIAGNOSIS — I10 Essential (primary) hypertension: Secondary | ICD-10-CM | POA: Diagnosis not present

## 2021-02-16 DIAGNOSIS — Z171 Estrogen receptor negative status [ER-]: Secondary | ICD-10-CM

## 2021-02-16 DIAGNOSIS — C799 Secondary malignant neoplasm of unspecified site: Secondary | ICD-10-CM | POA: Diagnosis not present

## 2021-02-16 NOTE — Progress Notes (Signed)
ID: Debra Mcintyre   DOB: September 21, 1940  MR#: [id number]  [id number]#:[id number]  Patient Care Team: Debra Mcintyre, Debra Halsted, MD as PCP - General (Internal Medicine) Debra Mcintyre, Debra Dad, MD as Consulting Physician (Oncology) Debra Salina, MD as Consulting Physician (Obstetrics and Gynecology) Debra Bookbinder, MD as Consulting Physician (General Surgery) Debra Guys, MD as Consulting Physician (Ophthalmology) Debra Mcintyre, DDS (Dentistry) Debra Mantis, MD (Dermatology) Debra Skinner Acey Lav, MD as Consulting Physician (Psychiatry) Dawley, Theodoro Doing, DO as Consulting Physician   CHIEF COMPLAINT: metastatic breast cancer (s/p right mastectomy)  CURRENT TREATMENT: dexamethasone   INTERVAL HISTORY: Debra Mcintyre returns today for re-evaluation of her new metastatic breast cancer. She was released from follow up of her triple negative breast cancer in 05/2018.  She presented to the ED on 02/10/2021 with left-sided facial droop, history of mild headache, tinnitus, and mild vision change over the last week. She proceeded to brain MRI at that time revealing: three enhancing mass lesions within brain compatible with metastatic disease in right cerebellum, left temporal lobe, and right lateral occipital lobe with mild surrounding edema.  Her case was discussed with Dr. Mickeal Skinner, and she was given a dose of decadron prior to discharge. She continues on 4 mg daily.  She also met with Debra Mcintyre from [facility] and.  Her case is being presented at conference 02/21/2021, with repeat MRI 02/24/2021.  Of note, her most recent left screening mammogram from 09/24/2020 was negative.  Also she had   REVIEW OF SYSTEMS: Debra Mcintyre tells me she is now able to close her left eye which was difficult for a while.  She does have some eyedrops but she does not feel she needs to use them.  She has a mild nuchal headache which has been present for some time.  She takes Tylenol for this.  She has had no  nausea or vomiting, no double vision or blacking out, no balance issues or falls.  Peripherally she has had no cough phlegm production pleurisy shortness of breath or change in bowel or bladder habits.  She does not hurt anywhere else, has not lost weight, and there has been no bleeding or bruising.  A detailed review of systems was otherwise stable   COVID 19 VACCINATION STATUS: Pfizer x 2  plus 2 boosters, most recently June 2020   BREAST CANCER HISTORY: From the original intake note:  Debra Mcintyre had screening mammography at [facility] 04/10/2012 raising the question of some axillary lymph nodes on the right. Additional views of the right axilla and right axillary ultrasound performed 04/04/2012 showed 3 lymph nodes that appeared larger than prior. The largest measured 1.5 cm. 2 of them had cortical thickening. Close followup was suggested, and repeat right axillary ultrasound 06/25/2012 showed no significant change in the lymph nodes in question. Followup ultrasound in 3 months was recommended, but in the interim the patient saw her primary physician, Debra Mcintyre and [facility], and he set her up for a chest CT on 07/08/2012, which showed a mild asymmetric density in the upper mid right breast. There was a prominent right axillary lymph node measuring 1.9 cm, with a few smaller adjacent nodes asymmetric with compared to the left side. Again, short interval followup was recommended, and on 10/07/2012 the patient had digital right mammography and ultrasonography, now at the breast Center. Debra Mcintyre noted pleomorphic calcifications spanning an area of 6.3 cm without associated mass. Physical exam was unremarkable. Ultrasound showed an area of adenopathy in the right axilla measuring 1.6 cm, with  a second lymph node with a thickened cortex measuring 1.2 cm. No suspicious mass was seen by ultrasound in the right breast.  Biopsy of the larger axillary lymph node on 10/07/2012 showed ([id number]) an invasive ductal carcinoma,  triple negative, with an MIB-1 of 66%. Biopsy of the right breast the next day, [id number]) showed invasive ductal carcinoma, grade 3. Breast MRI 10/14/2012 showed a total area of irregular enhancement in the right breast measuring up to 12 cm. MRI guided biopsy of an area in the upper inner quadrant of the right breast on 10/23/2012 showed ([id number]) ductal carcinoma in situ, with foci worrisome for invasion.  The patient's subsequent history is as detailed below   PAST MEDICAL HISTORY: Past Medical History:  Diagnosis Date   Anemia    Breast cancer ([place]) 09/2012   right breast/right axillary lymph node t2,pn1, stage 11b, invasive ductal carcinma, grade 3, triple negative, with an mib-1 of 15%   Cataract    bilateral removed   Cerebral aneurysm, nonruptured    Cerebrovascular disease, unspecified    Colonic polyp    Dizziness    pt. states that she experiences this occassionally 05/03/15   Headache    occassionally   Heart murmur    History of chemotherapy    Hx of radiation therapy 05/27/13-08/13/13   right chest wall/right supraclavicular/axillary region 5220 cGy 29 sessions, right mastectomy/chest wall boost cGy 5 sessions   Hypertension    Does not see a cardiologist   Hypothyroidism    Inguinal hernia    Neuropathy    Pure hypercholesterolemia    Rosacea    Seasonal allergies    Stroke ([place]) 2006   no deficits - TIA   TIA (transient ischemic attack)    Wears glasses     PAST SURGICAL HISTORY: Past Surgical History:  Procedure Laterality Date   BREAST BIOPSY Right 10/23/2012   BREAST BIOPSY Right 10/08/2012   BREAST BIOPSY Left 08/13/2019   REACTIVE LYMPH NODE WITH FOLLICULAR HYPERPLASIA   COLONOSCOPY     COLONOSCOPY W/ POLYPECTOMY     ESOPHAGOGASTRODUODENOSCOPY     Left middle cerebral artery angioplasty  2004   by [name] had 2 follow up occurances   MASTECTOMY Right    MASTECTOMY W/ SENTINEL NODE BIOPSY Right 04/23/2013   Procedure: RIGHT TOTAL  MASTECTOMY WITH SENTINEL LYMPH NODE BIOPSY;  Surgeon: Debra Bookbinder, MD;  Location: [facility];  Service: General;  Laterality: Right;   PORTACATH PLACEMENT N/A 11/05/2012   Procedure: INSERTION PORT-A-CATH;  Surgeon: Debra Bookbinder, MD;  Location: [facility];  Service: General;  Laterality: N/A;   RADIOLOGY WITH ANESTHESIA N/A 05/04/2015   Procedure: MRI BRAIN WITH AND WITHOUT;  Surgeon: Medication Radiologist, MD;  Location: [facility];  Service: Radiology;  Laterality: N/A;    FAMILY HISTORY Family History  Problem Relation Age of Onset   Lung cancer Mother 47   Heart disease Father    Colon cancer Brother 25   Alcohol abuse Brother    Colon cancer Maternal Uncle        dx in his 16s   Colon cancer Maternal Aunt    Ovarian cancer Other 47   Lung cancer Maternal Aunt    Breast cancer Sister    Rectal cancer Neg Hx    Stomach cancer Neg Hx    Esophageal cancer Neg Hx    the patient's father died at the age of 41. The patient's mother died at the age of 56  from lung cancer. She was not a smoker. The cancer was diagnosed when she was 78 years old. The patient's mother had 2 sisters and one brother. One of those sisters had lung cancer and a brother had colon cancer, both diagnosed in their 62s. The patient herself has one brother who died from colon cancer at the age of 31. That brother has a daughter who was diagnosed with uterine cancer at the age of 92. All this raises the question of possible Lynch syndrome and I have referred the patient for genetics counseling. The patient does have one sister who was diagnosed with breast cancer in 2015.   GYNECOLOGIC HISTORY: Menarche age 9, menopause around 32. The patient is GX P0. She took birth control pills for many years with no complications.   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 Her stepchildren are [name] who lives in "[place]" and is financial vice president of [employer], and [place] who lives in [place] and works as a Tree surgeon. The patient has 5 grandchildren including a granddaughter who is Passenger transport manager for [employer]   ADVANCED DIRECTIVES: Not in place; at the 02/17/2021 visit the patient was given the appropriate forms to complete and notarized at her discretion.  She is planning to name her friend Gladstone Lighter as healthcare power of attorney   HEALTH MAINTENANCE: Social History   Tobacco Use   Smoking status: Former    Packs/day: 1.00    Years: 20.00    Pack years: 20.00    Types: Cigarettes    Quit date: 04/02/1986    Years since quitting: 34.9   Smokeless tobacco: Never  Vaping Use   Vaping Use: Never used  Substance Use Topics   Alcohol use: No   Drug use: No     Colonoscopy: 02/10/2015  PAP: 2012  Bone density: 05/01/2017, T-score of -0.4.  Lipid panel: 06/22/2017  No Known Allergies  Current Outpatient Medications  Medication Sig Dispense Refill   acetaminophen (TYLENOL) 500 MG tablet Take 1,000 mg by mouth every 6 (six) hours as needed (for headaches).     Calcium Carbonate (CALTRATE 600 PO) Take 1 tablet by mouth in the morning and at bedtime.     clopidogrel (PLAVIX) 75 MG tablet TAKE 1 TABLET BY MOUTH DAILY (Patient taking differently: Take 75 mg by mouth at bedtime.) 90 tablet 1   dexamethasone (DECADRON) 4 MG tablet Take 1 tablet (4 mg total) by mouth daily. 30 tablet 0   levothyroxine (SYNTHROID) 137 MCG tablet TAKE 1 TABLET(137 MCG) BY MOUTH DAILY (Patient taking differently: Take 137 mcg by mouth daily before breakfast.) 90 tablet 1   lisinopril (ZESTRIL) 20 MG tablet TAKE 1 TABLET(20 MG) BY MOUTH DAILY (Patient taking differently: Take 10 mg by mouth at bedtime.) 90 tablet 1   metoprolol succinate (TOPROL-XL) 100 MG 24 hr tablet TAKE 1 TABLET BY MOUTH EVERY DAY WITH OR IMMEDIATELY FOLLOWING A MEAL (Patient taking differently: Take 100 mg by mouth at  bedtime.) 90 tablet 1   Multiple Vitamins-Minerals (CENTRUM SILVER ULTRA WOMENS) TABS Take 1 tablet by mouth daily with breakfast.     rosuvastatin (CRESTOR) 20 MG tablet TAKE 1 TABLET BY MOUTH DAILY (Patient taking differently: Take 20 mg by mouth at bedtime.) 90 tablet 0   sertraline (ZOLOFT) 100 MG tablet TAKE 1 TABLET(100 MG) BY MOUTH DAILY (Patient taking differently: Take 100 mg by  mouth in the morning.) 90 tablet 1   psyllium (METAMUCIL) 58.6 % powder Take 1 packet by mouth as needed. (Patient not taking: Reported on 02/17/2021)     No current facility-administered medications for this visit.    OBJECTIVE: White woman who appears younger than stated age  39:   02/17/21 1543  BP: 140/62  Pulse: 83  Resp: 18  Temp: 97.7 F (36.5 C)  SpO2: 98%     Body mass index is 24.75 kg/m.    ECOG FS: 1 Filed Weights   02/17/21 1543  Weight: 162 lb 12.8 oz (73.8 kg)   Sclerae unicteric, EOMs intact, able to close both eyes though blinking is down on the left Wearing a mask No cervical or supraclavicular adenopathy Lungs no rales or rhonchi Heart regular rate and rhythm Abd soft, nontender, positive bowel sounds MSK no focal spinal tenderness, no upper extremity lymphedema Neuro: nonfocal, well oriented, appropriate affect Breasts: Status post right mastectomy.  There is no evidence of chest wall recurrence.  The left breast is benign.  Both axillae are benign  LAB RESULTS: Lab Results  Component Value Date   WBC 12.1 (H) 02/17/2021   NEUTROABS 8.9 (H) 02/17/2021   HGB 13.2 02/17/2021   HCT 37.8 02/17/2021   MCV 86.1 02/17/2021   PLT 271 02/17/2021      Chemistry      Component Value Date/Time   NA 136 02/17/2021 1529   NA 141 03/21/2017 1105   K 3.8 02/17/2021 1529   K 4.3 03/21/2017 1105   CL 101 02/17/2021 1529   CL 102 01/28/2013 0909   CO2 24 02/17/2021 1529   CO2 25 03/21/2017 1105   BUN 20 02/17/2021 1529   BUN 12.5 03/21/2017 1105   CREATININE 0.78  02/17/2021 1529   CREATININE 0.79 06/30/2020 1446   CREATININE 0.7 03/21/2017 1105      Component Value Date/Time   CALCIUM 9.6 02/17/2021 1529   CALCIUM 9.8 03/21/2017 1105   ALKPHOS 57 02/17/2021 1529   ALKPHOS 48 03/21/2017 1105   AST 17 02/17/2021 1529   AST 21 03/21/2017 1105   ALT 19 02/17/2021 1529   ALT 21 03/21/2017 1105   BILITOT 0.6 02/17/2021 1529   BILITOT 0.86 03/21/2017 1105      STUDIES: MR BRAIN W WO CONTRAST  Result Date: 02/10/2021 CLINICAL DATA:  Stroke follow-up. Facial asymmetry of weakness to left upper eyelid. Headache. History of breast cancer 2014 EXAM: MRI HEAD WITHOUT AND WITH CONTRAST TECHNIQUE: Multiplanar, multiecho pulse sequences of the brain and surrounding structures were obtained without and with intravenous contrast. CONTRAST:  15m GADAVIST GADOBUTROL 1 MMOL/ML IV SOLN COMPARISON:  MRI head 05/04/2015 FINDINGS: Brain: 24 mm enhancing mass lesion in the right lateral cerebellum with central necrosis and surrounding edema. No displacement of the fourth ventricle. 30 mm enhancing mass lesion left inferior temporal lobe with central necrosis and mild edema. 10 mm enhancing mass lesion right lateral occipital cortex with mild edema. Multiple enhancing lesions are present compatible with metastatic disease. Ventricle size normal. No acute infarct. Scattered small deep white matter hyperintensities bilaterally most compatible with chronic microvascular ischemia. Negative for intracranial hemorrhage Vascular: Normal arterial flow voids Skull and upper cervical spine: No focal skeletal lesion. Sinuses/Orbits: Mild mucosal edema paranasal sinuses. Bilateral cataract extraction Other: None IMPRESSION: Three enhancing mass lesions the brain compatible with metastatic disease in the right cerebellum, left temporal lobe, and right lateral occipital lobe. Mild surrounding edema. Moderate chronic microvascular ischemic change  in the white matter. Electronically Signed   By:  Franchot Gallo M.D.   On: 02/10/2021 17:57     ASSESSMENT: 80 y.o. [place] woman   (1)  status post right breast upper outer quadrant and right axillary lymph node biopsies February 2014 of a clinical T2, pN1, stage IIB  invasive ductal carcinoma, grade 3, triple negative, with an MIB-1 of 15%  (2)  a third biopsy from a different quadrant 10/23/2012 showed ductal carcinoma in situ  (3)  treated in the neoadjuvant setting with 4 dose dense cycles of doxorubicin/ cyclophosphamide 12/23/2012, followed by 7 weekly doses of paclitaxel and completed 03/03/2013.  (4) status post right mastectomy and sentinel lymph node sampling 04/23/2013 for a residual ypT1b ypN1 invasive ductal carcinoma, grade 3, again triple negative (A) a total of 10 right axillary lymph nodes were removed, 2 with micrometastatic deposits  (B) the patient opted against reconstruction  (5) adjuvant radiation completed 08/13/2013  (6) the patient underwent genetics counseling but declined testing  METASTATIC DISEASE: June 2022  (A) brain MRI 02/10/2021 shows 3 separate brain masses compatible with metastatic disease (B) brain MRI for stereotactic planning protocol 02/24/2021 (C) CT scan of the chest (D) bone scan  (E) CA 27-29  PLAN:  Darrion is nearly 8 years out from her breast cancer surgery.  She now has 3 brain lesions which are consistent with metastases.  Most likely these will be from her breast cancer as we do not have any other primary.  Once we have tissue from her brain surgery we will be able to confirm that and also tell whether the tumor is still estrogen receptor positive or is now also HER2 positive or has turned a triple negative.  We discussed the fact that treatment of the central nervous system is very different from peripheral disease.  Most chemotherapy agents do not cross the blood-brain barrier.  Antiestrogens do have some effect in central nervous system tumors if they tumors are estrogen  dependent.  Of course at this point we do not know if she has peripheral disease or not.  Clearly the cancer was in her blood otherwise it could not have gotten to the brain and that does mean that it went "everywhere", but it does not mean that it could grow everywhere in the body.  I have set her up for a CT of the chest and a bone scan.  If those are not completely negative we will follow-up with a PET scan.  Note she did have an ultrasound of the liver December 2021 which showed no masses  She understands that stage IV breast cancer is not curable with our current knowledge base. The goal of treatment is control. The strategy of treatment is to do what ever is necessary to control the growth of the tumor so that the patient can have as normal a life as possible. There is no survival advantage in treating asymptomatic disease aggressively if treating less aggressively results in tumor control. With this strategy stage IV breast cancer in many cases can function as a "chronic illness": something that cannot be quite gotten rid of but can be controlled for an indefinite period of time.  I gave her the appropriate papers to complete as far as healthcare power of attorney is concerned.  Note that she has not yet told her stepchildren and their families of this new development.  She and her husband Milta Deiters, who is now 40 and according to her report somewhat debilitated (falling,  some confusion) are considering moving to [place].  She is having some insomnia from the steroids and I am writing gabapentin for her to take at bedtime.  Otherwise she will return to see me in 3 weeks.  At that time she may be done with her central nervous system treatment and ready to start systemic therapy  Total encounter time 65 minutes.Sarajane Jews C.  MD Oncology and Hematology [facility] [place], [place] [address] Tel. [phone]  Joylene Igo [phone]   I, Wilburn Mylar,  am acting as scribe for Dr. Sarajane Jews C. .  I, Lurline Del MD, have reviewed the above documentation for accuracy and completeness, and I agree with the above.    *Total Encounter Time as defined by the Centers for Medicare and Medicaid Services includes, in addition to the face-to-face time of a patient visit (documented in the note above) non-face-to-face time: obtaining and reviewing outside history, ordering and reviewing medications, tests or procedures, care coordination (communications with other health care professionals or caregivers) and documentation in the medical record.

## 2021-02-17 ENCOUNTER — Inpatient Hospital Stay: Payer: Medicare Other | Attending: Oncology | Admitting: Oncology

## 2021-02-17 ENCOUNTER — Other Ambulatory Visit (HOSPITAL_COMMUNITY): Payer: Self-pay | Admitting: Neurological Surgery

## 2021-02-17 ENCOUNTER — Other Ambulatory Visit: Payer: Self-pay

## 2021-02-17 ENCOUNTER — Inpatient Hospital Stay: Payer: Medicare Other

## 2021-02-17 VITALS — BP 140/62 | HR 83 | Temp 97.7°F | Resp 18 | Ht 68.0 in | Wt 162.8 lb

## 2021-02-17 DIAGNOSIS — C7931 Secondary malignant neoplasm of brain: Secondary | ICD-10-CM | POA: Insufficient documentation

## 2021-02-17 DIAGNOSIS — Z9011 Acquired absence of right breast and nipple: Secondary | ICD-10-CM | POA: Diagnosis not present

## 2021-02-17 DIAGNOSIS — Z803 Family history of malignant neoplasm of breast: Secondary | ICD-10-CM | POA: Diagnosis not present

## 2021-02-17 DIAGNOSIS — Z171 Estrogen receptor negative status [ER-]: Secondary | ICD-10-CM

## 2021-02-17 DIAGNOSIS — Z87891 Personal history of nicotine dependence: Secondary | ICD-10-CM | POA: Insufficient documentation

## 2021-02-17 DIAGNOSIS — Z8673 Personal history of transient ischemic attack (TIA), and cerebral infarction without residual deficits: Secondary | ICD-10-CM | POA: Diagnosis not present

## 2021-02-17 DIAGNOSIS — C50411 Malignant neoplasm of upper-outer quadrant of right female breast: Secondary | ICD-10-CM

## 2021-02-17 DIAGNOSIS — Z801 Family history of malignant neoplasm of trachea, bronchus and lung: Secondary | ICD-10-CM | POA: Diagnosis not present

## 2021-02-17 DIAGNOSIS — Z923 Personal history of irradiation: Secondary | ICD-10-CM | POA: Diagnosis not present

## 2021-02-17 DIAGNOSIS — M818 Other osteoporosis without current pathological fracture: Secondary | ICD-10-CM | POA: Diagnosis not present

## 2021-02-17 DIAGNOSIS — Z79899 Other long term (current) drug therapy: Secondary | ICD-10-CM | POA: Insufficient documentation

## 2021-02-17 LAB — CBC WITH DIFFERENTIAL (CANCER CENTER ONLY)
Abs Immature Granulocytes: 0.05 10*3/uL (ref 0.00–0.07)
Basophils Absolute: 0 10*3/uL (ref 0.0–0.1)
Basophils Relative: 0 %
Eosinophils Absolute: 0.1 10*3/uL (ref 0.0–0.5)
Eosinophils Relative: 1 %
HCT: 37.8 % (ref 36.0–46.0)
Hemoglobin: 13.2 g/dL (ref 12.0–15.0)
Immature Granulocytes: 0 %
Lymphocytes Relative: 19 %
Lymphs Abs: 2.3 10*3/uL (ref 0.7–4.0)
MCH: 30.1 pg (ref 26.0–34.0)
MCHC: 34.9 g/dL (ref 30.0–36.0)
MCV: 86.1 fL (ref 80.0–100.0)
Monocytes Absolute: 0.7 10*3/uL (ref 0.1–1.0)
Monocytes Relative: 6 %
Neutro Abs: 8.9 10*3/uL — ABNORMAL HIGH (ref 1.7–7.7)
Neutrophils Relative %: 74 %
Platelet Count: 271 10*3/uL (ref 150–400)
RBC: 4.39 MIL/uL (ref 3.87–5.11)
RDW: 13.2 % (ref 11.5–15.5)
WBC Count: 12.1 10*3/uL — ABNORMAL HIGH (ref 4.0–10.5)
nRBC: 0 % (ref 0.0–0.2)

## 2021-02-17 LAB — CMP (CANCER CENTER ONLY)
ALT: 19 U/L (ref 0–44)
AST: 17 U/L (ref 15–41)
Albumin: 3.9 g/dL (ref 3.5–5.0)
Alkaline Phosphatase: 57 U/L (ref 38–126)
Anion gap: 11 (ref 5–15)
BUN: 20 mg/dL (ref 8–23)
CO2: 24 mmol/L (ref 22–32)
Calcium: 9.6 mg/dL (ref 8.9–10.3)
Chloride: 101 mmol/L (ref 98–111)
Creatinine: 0.78 mg/dL (ref 0.44–1.00)
GFR, Estimated: >60 mL/min (ref >60)
Glucose, Bld: 127 mg/dL — ABNORMAL HIGH (ref 70–99)
Potassium: 3.8 mmol/L (ref 3.5–5.1)
Sodium: 136 mmol/L (ref 135–145)
Total Bilirubin: 0.6 mg/dL (ref 0.3–1.2)
Total Protein: 7.4 g/dL (ref 6.5–8.1)

## 2021-02-17 MED ORDER — GABAPENTIN 300 MG PO CAPS: 300.0000 mg | ORAL_CAPSULE | Freq: Every day | ORAL | 4 refills | Status: AC

## 2021-02-17 NOTE — Addendum Note (Signed)
Addended by: Chauncey Cruel on: 02/17/2021 05:17 PM   Modules accepted: Orders

## 2021-02-18 ENCOUNTER — Telehealth: Payer: Self-pay | Admitting: Oncology

## 2021-02-18 LAB — CANCER ANTIGEN 27.29: CA 27.29: 65 U/mL — ABNORMAL HIGH (ref 0.0–38.6)

## 2021-02-18 NOTE — Telephone Encounter (Signed)
Sch per 7/7 los, pt aware 

## 2021-02-22 ENCOUNTER — Other Ambulatory Visit: Payer: Self-pay | Admitting: Radiation Therapy

## 2021-02-22 ENCOUNTER — Other Ambulatory Visit: Payer: Self-pay | Admitting: Neurological Surgery

## 2021-02-22 ENCOUNTER — Other Ambulatory Visit: Payer: Self-pay

## 2021-02-22 ENCOUNTER — Inpatient Hospital Stay (HOSPITAL_BASED_OUTPATIENT_CLINIC_OR_DEPARTMENT_OTHER): Payer: Medicare Other | Admitting: Internal Medicine

## 2021-02-22 VITALS — BP 141/73 | HR 67 | Temp 96.7°F | Resp 18 | Ht 68.0 in | Wt 159.9 lb

## 2021-02-22 DIAGNOSIS — Z79899 Other long term (current) drug therapy: Secondary | ICD-10-CM | POA: Diagnosis not present

## 2021-02-22 DIAGNOSIS — C50411 Malignant neoplasm of upper-outer quadrant of right female breast: Secondary | ICD-10-CM | POA: Diagnosis not present

## 2021-02-22 DIAGNOSIS — Z923 Personal history of irradiation: Secondary | ICD-10-CM | POA: Diagnosis not present

## 2021-02-22 DIAGNOSIS — C7931 Secondary malignant neoplasm of brain: Secondary | ICD-10-CM | POA: Diagnosis not present

## 2021-02-22 DIAGNOSIS — Z9011 Acquired absence of right breast and nipple: Secondary | ICD-10-CM | POA: Diagnosis not present

## 2021-02-22 DIAGNOSIS — Z171 Estrogen receptor negative status [ER-]: Secondary | ICD-10-CM | POA: Diagnosis not present

## 2021-02-22 MED ORDER — VALACYCLOVIR HCL 1 G PO TABS: 1000.0000 mg | ORAL_TABLET | Freq: Three times a day (TID) | ORAL | 0 refills | Status: AC

## 2021-02-22 NOTE — Progress Notes (Signed)
[facility] [place], [place] [address] [phone]   New Patient Evaluation  Date of Service: 02/22/21 Patient Name: Debra Mcintyre Patient MRN: [id number] Patient DOB: Jul 12, 1941 Provider: Ventura Sellers, MD  Identifying Statement:  [name] is a 80 y.o. female with Brain metastases [facility])   Primary Cancer: Breast ER+ (2014)  CNS Oncologic History 02/10/21: Presents with bell's palsy, MRI brain demonstrates 3 enhancing lesions c/w likely metastases  History of Present Illness: The patient's records from the referring physician were obtained and reviewed and the patient interviewed to confirm this HPI.  Sintia Mcintyre presented to recent medical attention on 02/10/21, after waking up with severe facial weakness on the left.  She went to bed normal the night prior.  Several days before the weakness, she noted some pain behind her left ear, and ringing in the ear.  CNS imaging was obtained, which demonstrated multiple brain tumors consistent with metastases.  She does have remote history of breast cancer, treated most recently in 2014.  Steroids were initiated in the ED, decadron 60m daily.  Her pain has resolved, but facial weakness has improved only mildly over the past two weeks.  No anti-viral was started.  Aside from some recent impairments in short term memory, she is fully functional and independent.  Helps take care of her husband who has his own medical problems.  Pending re-staging scans from Dr. MJana Hakim  Medications: Current Outpatient Medications on File Prior to Visit  Medication Sig Dispense Refill   acetaminophen (TYLENOL) 500 MG tablet Take 1,000 mg by mouth every 6 (six) hours as needed (for headaches).     Calcium Carbonate (CALTRATE 600 PO) Take 1 tablet by mouth in the morning and at bedtime.     clopidogrel (PLAVIX) 75 MG tablet TAKE 1 TABLET BY MOUTH DAILY (Patient taking differently:  Take 75 mg by mouth at bedtime.) 90 tablet 1   dexamethasone (DECADRON) 4 MG tablet Take 1 tablet (4 mg total) by mouth daily. 30 tablet 0   gabapentin (NEURONTIN) 300 MG capsule Take 1 capsule (300 mg total) by mouth at bedtime. 90 capsule 4   levothyroxine (SYNTHROID) 137 MCG tablet TAKE 1 TABLET(137 MCG) BY MOUTH DAILY (Patient taking differently: Take 137 mcg by mouth daily before breakfast.) 90 tablet 1   lisinopril (ZESTRIL) 20 MG tablet TAKE 1 TABLET(20 MG) BY MOUTH DAILY (Patient taking differently: Take 10 mg by mouth at bedtime.) 90 tablet 1   metoprolol succinate (TOPROL-XL) 100 MG 24 hr tablet TAKE 1 TABLET BY MOUTH EVERY DAY WITH OR IMMEDIATELY FOLLOWING A MEAL (Patient taking differently: Take 100 mg by mouth at bedtime.) 90 tablet 1   Multiple Vitamins-Minerals (CENTRUM SILVER ULTRA WOMENS) TABS Take 1 tablet by mouth daily with breakfast.     psyllium (METAMUCIL) 58.6 % powder Take 1 packet by mouth as needed. (Patient not taking: Reported on 02/17/2021)     rosuvastatin (CRESTOR) 20 MG tablet TAKE 1 TABLET BY MOUTH DAILY (Patient taking differently: Take 20 mg by mouth at bedtime.) 90 tablet 0   sertraline (ZOLOFT) 100 MG tablet TAKE 1 TABLET(100 MG) BY MOUTH DAILY (Patient taking differently: Take 100 mg by mouth in the morning.) 90 tablet 1   No current facility-administered medications on file prior to visit.    Allergies: No Known Allergies Past Medical History:  Past Medical History:  Diagnosis Date   Anemia    [place]  right breast/right axillary lymph node t2,pn1, stage 11b, invasive ductal carcinma, grade 3, triple negative, with an mib-1 of 15%   Cataract    bilateral removed   Cerebral aneurysm, nonruptured    Cerebrovascular disease, unspecified    Colonic polyp    Dizziness    pt. states that she experiences this occassionally 05/03/15   Headache    occassionally   Heart murmur    History of chemotherapy    Hx of radiation therapy  05/27/13-08/13/13   right chest wall/right supraclavicular/axillary region 5220 cGy 29 sessions, right mastectomy/chest wall boost cGy 5 sessions   Hypertension    Does not see a cardiologist   Hypothyroidism    Inguinal hernia    Neuropathy    Pure hypercholesterolemia    Rosacea    Seasonal allergies    Stroke ([place]) 2006   no deficits - TIA   TIA (transient ischemic attack)    Wears glasses    Past Surgical History:  Past Surgical History:  Procedure Laterality Date   BREAST BIOPSY Right 10/23/2012   BREAST BIOPSY Right 10/08/2012   BREAST BIOPSY Left 08/13/2019   REACTIVE LYMPH NODE WITH FOLLICULAR HYPERPLASIA   COLONOSCOPY     COLONOSCOPY W/ POLYPECTOMY     ESOPHAGOGASTRODUODENOSCOPY     Left middle cerebral artery angioplasty  2004   by [name] had 2 follow up occurances   MASTECTOMY Right    MASTECTOMY W/ SENTINEL NODE BIOPSY Right 04/23/2013   Procedure: RIGHT TOTAL MASTECTOMY WITH SENTINEL LYMPH NODE BIOPSY;  Surgeon: [name], MD;  Location: [facility];  Service: General;  Laterality: Right;   PORTACATH PLACEMENT N/A 11/05/2012   Procedure: INSERTION PORT-A-CATH;  Surgeon: [name], MD;  Location: [facility];  Service: General;  Laterality: N/A;   RADIOLOGY WITH ANESTHESIA N/A 05/04/2015   Procedure: MRI BRAIN WITH AND WITHOUT;  Surgeon: Medication Radiologist, MD;  Location: [facility];  Service: Radiology;  Laterality: N/A;   Social History:  Social History   Socioeconomic History   Marital status: Married    Spouse name: neil   Number of children: 0   Years of education: Not on file   Highest education level: Not on file  Occupational History   Occupation: retired    Fish farm manager: RETIRED  Tobacco Use   Smoking status: Former    Packs/day: 1.00    Years: 20.00    Pack years: 20.00    Types: Cigarettes    Quit date: 04/02/1986    Years since quitting: 34.9   Smokeless tobacco: Never  Vaping Use   Vaping Use: Never used  Substance  and Sexual Activity   Alcohol use: No   Drug use: No   Sexual activity: Not on file    Comment: menarche age 78, G6, P0, menopause 1996, BCP x many years  Other Topics Concern   Not on file  Social History Narrative   Not on file   Social Determinants of Health   Financial Resource Strain: Not on file  Food Insecurity: Not on file  Transportation Needs: Not on file  Physical Activity: Not on file  Stress: Not on file  Social Connections: Not on file  Intimate Partner Violence: Not on file   Family History:  Family History  Problem Relation Age of Onset   Lung cancer Mother 65   Heart disease Father    Colon cancer Brother 66   Alcohol abuse Brother    Colon cancer Maternal Uncle  dx in his 108s   Colon cancer Maternal Aunt    Ovarian cancer Other 47   Lung cancer Maternal Aunt    Breast cancer Sister    Rectal cancer Neg Hx    Stomach cancer Neg Hx    Esophageal cancer Neg Hx     Review of Systems: Constitutional: Doesn't report fevers, chills or abnormal weight loss Eyes: Doesn't report blurriness of vision Ears, nose, mouth, throat, and face: Doesn't report sore throat Respiratory: Doesn't report cough, dyspnea or wheezes Cardiovascular: Doesn't report palpitation, chest discomfort  Gastrointestinal:  Doesn't report nausea, constipation, diarrhea GU: Doesn't report incontinence Skin: Doesn't report skin rashes Neurological: Per HPI Musculoskeletal: Doesn't report joint pain Behavioral/Psych: Doesn't report anxiety  Physical Exam: Vitals:   02/22/21 1002  BP: (!) 141/73  Pulse: 67  Resp: 18  Temp: (!) 96.7 F (35.9 C)  SpO2: 100%   KPS: 80. General: Alert, cooperative, pleasant, in no acute distress Head: Normal EENT: No conjunctival injection or scleral icterus.  Lungs: Resp effort normal Cardiac: Regular rate Abdomen: Non-distended abdomen Skin: No rashes cyanosis or petechiae. Extremities: No clubbing or edema  Neurologic Exam: Mental  Status: Awake, alert, attentive to examiner. Oriented to self and environment. Language is fluent with intact comprehension.  Cranial Nerves: Visual acuity is grossly normal. Visual fields are full. Extra-ocular movements intact. Dense left lower motor neuron facial paresis. Motor: Tone and bulk are normal. Power is full in both arms and legs. Reflexes are symmetric, no pathologic reflexes present.  Sensory: Intact to light touch Gait: Normal.   Labs: I have reviewed the data as listed    Component Value Date/Time   NA 136 02/17/2021 1529   NA 141 03/21/2017 1105   K 3.8 02/17/2021 1529   K 4.3 03/21/2017 1105   CL 101 02/17/2021 1529   CL 102 01/28/2013 0909   CO2 24 02/17/2021 1529   CO2 25 03/21/2017 1105   GLUCOSE 127 (H) 02/17/2021 1529   GLUCOSE 91 03/21/2017 1105   GLUCOSE 110 (H) 01/28/2013 0909   BUN 20 02/17/2021 1529   BUN 12.5 03/21/2017 1105   CREATININE 0.78 02/17/2021 1529   CREATININE 0.79 06/30/2020 1446   CREATININE 0.7 03/21/2017 1105   CALCIUM 9.6 02/17/2021 1529   CALCIUM 9.8 03/21/2017 1105   PROT 7.4 02/17/2021 1529   PROT 6.8 03/21/2017 1105   ALBUMIN 3.9 02/17/2021 1529   ALBUMIN 3.9 03/21/2017 1105   AST 17 02/17/2021 1529   AST 21 03/21/2017 1105   ALT 19 02/17/2021 1529   ALT 21 03/21/2017 1105   ALKPHOS 57 02/17/2021 1529   ALKPHOS 48 03/21/2017 1105   BILITOT 0.6 02/17/2021 1529   BILITOT 0.86 03/21/2017 1105   GFRNONAA >60 02/17/2021 1529   GFRAA >60 06/04/2018 1150   Lab Results  Component Value Date   WBC 12.1 (H) 02/17/2021   NEUTROABS 8.9 (H) 02/17/2021   HGB 13.2 02/17/2021   HCT 37.8 02/17/2021   MCV 86.1 02/17/2021   PLT 271 02/17/2021    Imaging:  MR BRAIN W WO CONTRAST  Result Date: 02/10/2021 CLINICAL DATA:  Stroke follow-up. Facial asymmetry of weakness to left upper eyelid. Headache. History of breast cancer 2014 EXAM: MRI HEAD WITHOUT AND WITH CONTRAST TECHNIQUE: Multiplanar, multiecho pulse sequences of the brain  and surrounding structures were obtained without and with intravenous contrast. CONTRAST:  9m GADAVIST GADOBUTROL 1 MMOL/ML IV SOLN COMPARISON:  MRI head 05/04/2015 FINDINGS: Brain: 24 mm enhancing mass lesion in the  right lateral cerebellum with central necrosis and surrounding edema. No displacement of the fourth ventricle. 30 mm enhancing mass lesion left inferior temporal lobe with central necrosis and mild edema. 10 mm enhancing mass lesion right lateral occipital cortex with mild edema. Multiple enhancing lesions are present compatible with metastatic disease. Ventricle size normal. No acute infarct. Scattered small deep white matter hyperintensities bilaterally most compatible with chronic microvascular ischemia. Negative for intracranial hemorrhage Vascular: Normal arterial flow voids Skull and upper cervical spine: No focal skeletal lesion. Sinuses/Orbits: Mild mucosal edema paranasal sinuses. Bilateral cataract extraction Other: None IMPRESSION: Three enhancing mass lesions the brain compatible with metastatic disease in the right cerebellum, left temporal lobe, and right lateral occipital lobe. Mild surrounding edema. Moderate chronic microvascular ischemic change in the white matter. Electronically Signed   By: Franchot Gallo M.D.   On: 02/10/2021 17:57      Assessment/Plan Brain metastases [facility]) [C79.31]  Sinclair Ship presents with clinical and radiographic syndrome consistent with multifocal CNS metastases, suspected secondary to known breast cancer.  Her facial weakness is consistent with bell's palsy, and is almost certainly unrelated to the cancer process.  Unfortunately corticosteroids over the past two weeks have improved her situation only minimally.    Her case was discussed in brain/spine tumor board.  Plan will be put in place for craniotomy, resection of dominant left temporal mass.  In addition to debulking burden of tumor within brain, this will accomplish a histologic  re-appraisal, needed in absence of assessment for 7-8 years.  She met with Dr. Reatha Armour and is agreeable with this plan.  We will stratify her for post-operative radiosurgery follow craniotomy.  Cerebellar lesion will likely need to be fractionated.    For bell's palsy, we recommended dosing valacyclovir given severity of weakness and poor response/improvement from steroids.  Will dose 1037m TID x7 days.  In meantime, decadron can be decreased to 268mdaily.      We spent twenty additional minutes teaching regarding the natural history, biology, and historical experience in the treatment of neurologic complications of cancer.   We appreciate the opportunity to participate in the care of [employer]  We will follow up with her after post-SRS MRI study in ~4 months.  All questions were answered. The patient knows to call the clinic with any problems, questions or concerns. No barriers to learning were detected.  The total time spent in the encounter was 40 minutes and more than 50% was on counseling and review of test results   ZaVentura SellersMD Medical Director of Neuro-Oncology [facility] WeWilkerson7/12/22 3:23 PM

## 2021-02-23 ENCOUNTER — Other Ambulatory Visit: Payer: Self-pay | Admitting: Neurological Surgery

## 2021-02-24 ENCOUNTER — Ambulatory Visit (HOSPITAL_COMMUNITY)
Admission: RE | Admit: 2021-02-24 | Discharge: 2021-02-24 | Disposition: A | Discharge status: Home / Self Care | Payer: Medicare Other | Source: Ambulatory Visit | Attending: Neurological Surgery | Admitting: Neurological Surgery

## 2021-02-24 ENCOUNTER — Other Ambulatory Visit: Payer: Self-pay

## 2021-02-24 DIAGNOSIS — C7931 Secondary malignant neoplasm of brain: Secondary | ICD-10-CM | POA: Diagnosis not present

## 2021-02-24 DIAGNOSIS — G9389 Other specified disorders of brain: Secondary | ICD-10-CM | POA: Diagnosis not present

## 2021-02-24 MED ORDER — GADOBUTROL 1 MMOL/ML IV SOLN
7.0000 mL | Freq: Once | INTRAVENOUS | Status: AC | PRN
  Administered 2021-02-24: 7 mL via INTRAVENOUS

## 2021-02-28 ENCOUNTER — Ambulatory Visit: Payer: Medicare Other | Admitting: Podiatry

## 2021-02-28 ENCOUNTER — Encounter (HOSPITAL_COMMUNITY)
Admission: RE | Admit: 2021-02-28 | Discharge: 2021-02-28 | Disposition: A | Discharge status: Home / Self Care | Payer: Medicare Other | Source: Ambulatory Visit | Attending: Oncology | Admitting: Oncology

## 2021-02-28 ENCOUNTER — Other Ambulatory Visit: Payer: Self-pay

## 2021-02-28 DIAGNOSIS — C7931 Secondary malignant neoplasm of brain: Secondary | ICD-10-CM | POA: Insufficient documentation

## 2021-02-28 DIAGNOSIS — M818 Other osteoporosis without current pathological fracture: Secondary | ICD-10-CM | POA: Diagnosis not present

## 2021-02-28 DIAGNOSIS — C50411 Malignant neoplasm of upper-outer quadrant of right female breast: Secondary | ICD-10-CM | POA: Diagnosis not present

## 2021-02-28 DIAGNOSIS — R911 Solitary pulmonary nodule: Secondary | ICD-10-CM | POA: Diagnosis not present

## 2021-02-28 DIAGNOSIS — Z171 Estrogen receptor negative status [ER-]: Secondary | ICD-10-CM | POA: Insufficient documentation

## 2021-02-28 DIAGNOSIS — R918 Other nonspecific abnormal finding of lung field: Secondary | ICD-10-CM | POA: Diagnosis not present

## 2021-02-28 DIAGNOSIS — I700 Atherosclerosis of aorta: Secondary | ICD-10-CM | POA: Diagnosis not present

## 2021-02-28 MED ORDER — IOHEXOL 350 MG/ML SOLN
100.0000 mL | Freq: Once | INTRAVENOUS | Status: AC | PRN
  Administered 2021-02-28: 60 mL via INTRAVENOUS

## 2021-03-02 ENCOUNTER — Ambulatory Visit (INDEPENDENT_AMBULATORY_CARE_PROVIDER_SITE_OTHER): Payer: Medicare Other | Admitting: Podiatry

## 2021-03-02 ENCOUNTER — Ambulatory Visit (INDEPENDENT_AMBULATORY_CARE_PROVIDER_SITE_OTHER): Payer: Medicare Other

## 2021-03-02 ENCOUNTER — Encounter: Payer: Self-pay | Admitting: Podiatry

## 2021-03-02 ENCOUNTER — Other Ambulatory Visit: Payer: Self-pay

## 2021-03-02 DIAGNOSIS — L97529 Non-pressure chronic ulcer of other part of left foot with unspecified severity: Secondary | ICD-10-CM

## 2021-03-02 NOTE — Progress Notes (Signed)
g

## 2021-03-02 NOTE — Pre-Procedure Instructions (Signed)
Surgical Instructions    Your procedure is scheduled on 03/08/21.  Report to [facility] Main Entrance "A" at 11:15 A.M., then check in with the Admitting office.  Call this number if you have problems the morning of surgery:  [phone]   If you have any questions prior to your surgery date call [phone]: Open Monday-Friday 8am-4pm    Remember:  Do not eat or drink after midnight the night before your surgery    Take these medicines the morning of surgery with A SIP OF WATER  acetaminophen (TYLENOL)  if needed Glycerin-Polysorbate 80 (REFRESH DRY EYE THERAPY OP) if needed levothyroxine (SYNTHROID) metoprolol succinate (TOPROL-XL) sertraline (ZOLOFT)  Please follow your surgeon's instructions for clopidogrel (PLAVIX). If you have not received instructions, please contact your surgeon's office for blood thinner instructions.  As of today, STOP taking any Aspirin (unless otherwise instructed by your surgeon) Aleve, Naproxen, Ibuprofen, Motrin, Advil, Goody's, BC's, all herbal medications, fish oil, and all vitamins.          Do not wear jewelry or makeup Do not wear lotions, powders, perfumes/colognes, or deodorant. Do not shave 48 hours prior to surgery.  Men may shave face and neck. Do not bring valuables to the hospital. DO Not wear nail polish, gel polish, artificial nails, or any other type of covering on  natural nails including finger and toenails. If patients have artificial nails, gel coating, etc. that need to be removed by a nail salon please have this removed prior to surgery or surgery may need to be canceled/delayed if the surgeon/ anesthesia feels like the patient is unable to be adequately monitored.             [facility] is not responsible for any belongings or valuables.  Do NOT Smoke (Tobacco/Vaping) or drink Alcohol 24 hours prior to your procedure If you use a CPAP at night, you may bring all equipment for your overnight stay.   Contacts, glasses,  dentures or bridgework may not be worn into surgery, please bring cases for these belongings   For patients admitted to the hospital, discharge time will be determined by your treatment team.   Patients discharged the day of surgery will not be allowed to drive home, and someone needs to stay with them for 24 hours.  ONLY 1 SUPPORT PERSON MAY BE PRESENT WHILE YOU ARE IN SURGERY. IF YOU ARE TO BE ADMITTED ONCE YOU ARE IN YOUR ROOM YOU WILL BE ALLOWED TWO (2) VISITORS.  Minor children may have two parents present. Special consideration for safety and communication needs will be reviewed on a case by case basis.  Special instructions:    Oral Hygiene is also important to reduce your risk of infection.  Remember - BRUSH YOUR TEETH THE MORNING OF SURGERY WITH YOUR REGULAR TOOTHPASTE   [facility]- Preparing For Surgery  Before surgery, you can play an important role. Because skin is not sterile, your skin needs to be as free of germs as possible. You can reduce the number of germs on your skin by washing with CHG (chlorahexidine gluconate) Soap before surgery.  CHG is an antiseptic cleaner which kills germs and bonds with the skin to continue killing germs even after washing.     Please do not use if you have an allergy to CHG or antibacterial soaps. If your skin becomes reddened/irritated stop using the CHG.  Do not shave (including legs and underarms) for at least 48 hours prior to first CHG shower. It is  OK to shave your face.  Please follow these instructions carefully.     Shower the NIGHT BEFORE SURGERY and the MORNING OF SURGERY with CHG Soap.   If you chose to wash your hair, wash your hair first as usual with your normal shampoo. After you shampoo, rinse your hair and body thoroughly to remove the shampoo.  Then [employer] and genitals (private parts) with your normal soap and rinse thoroughly to remove soap.  After that Use CHG Soap as you would any other liquid soap. You can apply CHG  directly to the skin and wash gently with a scrungie or a clean washcloth.   Apply the CHG Soap to your body ONLY FROM THE NECK DOWN.  Do not use on open wounds or open sores. Avoid contact with your eyes, ears, mouth and genitals (private parts). Wash Face and genitals (private parts)  with your normal soap.   Wash thoroughly, paying special attention to the area where your surgery will be performed.  Thoroughly rinse your body with warm water from the neck down.  DO NOT shower/wash with your normal soap after using and rinsing off the CHG Soap.  Pat yourself dry with a CLEAN TOWEL.  Wear CLEAN PAJAMAS to bed the night before surgery  Place CLEAN SHEETS on your bed the night before your surgery  DO NOT SLEEP WITH PETS.   Day of Surgery:  Take a shower with CHG soap. Wear Clean/Comfortable clothing the morning of surgery Do not apply any deodorants/lotions.   Remember to brush your teeth WITH YOUR REGULAR TOOTHPASTE.   Please read over the following fact sheets that you were given.

## 2021-03-03 ENCOUNTER — Encounter (HOSPITAL_COMMUNITY): Payer: Self-pay

## 2021-03-03 ENCOUNTER — Encounter (HOSPITAL_COMMUNITY)
Admission: RE | Admit: 2021-03-03 | Discharge: 2021-03-03 | Disposition: A | Discharge status: Home / Self Care | Payer: Medicare Other | Source: Ambulatory Visit | Attending: Neurological Surgery | Admitting: Neurological Surgery

## 2021-03-03 ENCOUNTER — Other Ambulatory Visit: Payer: Self-pay

## 2021-03-03 DIAGNOSIS — Z01812 Encounter for preprocedural laboratory examination: Secondary | ICD-10-CM | POA: Insufficient documentation

## 2021-03-03 HISTORY — DX: Unspecified osteoarthritis, unspecified site: M19.90

## 2021-03-03 LAB — TYPE AND SCREEN
ABO/RH(D): A POS
Antibody Screen: NEGATIVE

## 2021-03-03 NOTE — Progress Notes (Signed)
PCP - Dr. Domingo Mend Cardiologist - denies Oncologist- Dr. Lurline Del  PPM/ICD - n/a Device Orders - n/a Rep Notified - n/a  Chest x-ray - CT Chest 02/28/21 EKG - 02/11/21 Stress Test - denies ECHO - 11/06/12 Cardiac Cath - denies  Sleep Study - denies CPAP - n/a  Fasting Blood Sugar - n/a Checks Blood Sugar- n/a times a day  Blood Thinner Instructions: Please contact your surgeon's office regarding Plavix. If you have not received instructions from your surgeon please contact your surgeon. Patient states she has not received instructions and will contact Dr. Reatha Armour as soon as she leaves her PAT appointment.  Aspirin Instructions: n/a  ERAS Protcol - NO PRE-SURGERY Ensure or G2- n/a  COVID TEST- Scheduled for Monday 03/07/21. Patient is aware of date, time, and location.    Anesthesia review: Yes. EKG review.  Patient denies shortness of breath, fever, cough and chest pain at PAT appointment   All instructions explained to the patient, with a verbal understanding of the material. Patient agrees to go over the instructions while at home for a better understanding. Patient also instructed to self quarantine after being tested for COVID-19. The opportunity to ask questions was provided.

## 2021-03-03 NOTE — Progress Notes (Signed)
Subjective:   Patient ID: Debra Mcintyre, female   DOB: 80 y.o.   MRN: [id number]   HPI Patient presents with a painful chronic lesion underneath her left big toe which has been draining.  States its gotten worse over the last couple months and states that she is also due to have brain surgery in the next week for masses.  Patient does not smoke and tries to be active   Review of Systems  All other systems reviewed and are negative.      Objective:  Physical Exam Vitals and nursing note reviewed.  Constitutional:      Appearance: She is well-developed.  Pulmonary:     Effort: Pulmonary effort is normal.  Musculoskeletal:        General: Normal range of motion.  Skin:    General: Skin is warm.  Neurological:     Mental Status: She is alert.    Neurovascular status is found to be intact muscle strength was noted to be adequate.  Range of motion within normal limits patient is noted to have a large dark lesion plantar aspect left hallux measuring approximately 1.2 cm x 1.5 cm that is within the hallux itself.  Patient does have good digital perfusion well oriented x3     Assessment:  Plantar lesion left that has a moderate ulcerative type appearance with pressure with no proximal edema erythema drainage noted     Plan:  H&P precautionary x-ray taken and I went ahead today and using sterile instrumentation I debrided the lesion fully I found her to be a small area of breakdown measuring approximately 3 x 3 mm with no subcutaneous exposure I flushed this area out applied Iodosorb with sterile dressing and instructed on home wet-to-dry dressings and offloading as best as possible.  Do not recommend any other treatments as she is getting ready to go through major surgery.  If it comes back she will reappoint  X-rays taken did not indicate osteolysis or indicate there is bony pathology associated with this condition

## 2021-03-04 ENCOUNTER — Encounter (HOSPITAL_COMMUNITY)
Admission: RE | Admit: 2021-03-04 | Discharge: 2021-03-04 | Disposition: A | Discharge status: Home / Self Care | Payer: Medicare Other | Source: Ambulatory Visit | Attending: Oncology | Admitting: Oncology

## 2021-03-04 DIAGNOSIS — C50919 Malignant neoplasm of unspecified site of unspecified female breast: Secondary | ICD-10-CM | POA: Diagnosis not present

## 2021-03-04 DIAGNOSIS — C7931 Secondary malignant neoplasm of brain: Secondary | ICD-10-CM | POA: Insufficient documentation

## 2021-03-04 DIAGNOSIS — C50411 Malignant neoplasm of upper-outer quadrant of right female breast: Secondary | ICD-10-CM | POA: Insufficient documentation

## 2021-03-04 DIAGNOSIS — Z171 Estrogen receptor negative status [ER-]: Secondary | ICD-10-CM | POA: Diagnosis not present

## 2021-03-04 DIAGNOSIS — M818 Other osteoporosis without current pathological fracture: Secondary | ICD-10-CM | POA: Diagnosis not present

## 2021-03-04 MED ORDER — TECHNETIUM TC 99M MEDRONATE IV KIT
20.3000 | PACK | Freq: Once | INTRAVENOUS | Status: AC
  Administered 2021-03-04: 20.3 via INTRAVENOUS

## 2021-03-07 ENCOUNTER — Other Ambulatory Visit (HOSPITAL_COMMUNITY): Payer: Medicare Other

## 2021-03-08 ENCOUNTER — Telehealth: Payer: Self-pay | Admitting: *Deleted

## 2021-03-08 NOTE — Telephone Encounter (Signed)
Notified of results below

## 2021-03-08 NOTE — Telephone Encounter (Signed)
-----   Message from Gardenia Phlegm, NP sent at 03/06/2021  1:02 PM EDT ----- Neg. Bone scan, please notify patient ----- Message ----- From: Interface, Rad Results In Sent: 03/06/2021  11:04 AM EDT To: Chauncey Cruel, MD

## 2021-03-08 NOTE — Telephone Encounter (Signed)
LM to call Dr Virgie Dad nurse.

## 2021-03-09 ENCOUNTER — Other Ambulatory Visit (HOSPITAL_COMMUNITY): Payer: Medicare Other

## 2021-03-10 ENCOUNTER — Other Ambulatory Visit (HOSPITAL_COMMUNITY)
Admission: RE | Admit: 2021-03-10 | Discharge: 2021-03-10 | Disposition: A | Discharge status: Home / Self Care | Payer: Medicare Other | Source: Ambulatory Visit | Attending: Neurological Surgery | Admitting: Neurological Surgery

## 2021-03-10 ENCOUNTER — Inpatient Hospital Stay (HOSPITAL_BASED_OUTPATIENT_CLINIC_OR_DEPARTMENT_OTHER): Payer: Medicare Other | Admitting: Oncology

## 2021-03-10 ENCOUNTER — Other Ambulatory Visit: Payer: Self-pay

## 2021-03-10 VITALS — BP 113/68 | HR 75 | Temp 97.7°F | Resp 18 | Wt 159.4 lb

## 2021-03-10 DIAGNOSIS — C7931 Secondary malignant neoplasm of brain: Secondary | ICD-10-CM | POA: Diagnosis not present

## 2021-03-10 DIAGNOSIS — R911 Solitary pulmonary nodule: Secondary | ICD-10-CM | POA: Insufficient documentation

## 2021-03-10 DIAGNOSIS — Z01812 Encounter for preprocedural laboratory examination: Secondary | ICD-10-CM | POA: Insufficient documentation

## 2021-03-10 DIAGNOSIS — C50411 Malignant neoplasm of upper-outer quadrant of right female breast: Secondary | ICD-10-CM | POA: Diagnosis not present

## 2021-03-10 DIAGNOSIS — Z20822 Contact with and (suspected) exposure to covid-19: Secondary | ICD-10-CM | POA: Insufficient documentation

## 2021-03-10 DIAGNOSIS — Z7189 Other specified counseling: Secondary | ICD-10-CM | POA: Insufficient documentation

## 2021-03-10 DIAGNOSIS — Z171 Estrogen receptor negative status [ER-]: Secondary | ICD-10-CM | POA: Diagnosis not present

## 2021-03-10 LAB — SARS CORONAVIRUS 2 (TAT 6-24 HRS): SARS Coronavirus 2: NEGATIVE

## 2021-03-10 NOTE — Progress Notes (Addendum)
ID: [name]   DOB: 1941/05/19  MR#: [id number]  QQP#:[id number]  Patient Care Team: Isaac Bliss, Rayford Halsted, MD as PCP - General (Internal Medicine) Faron Tudisco, Virgie Dad, MD as Consulting Physician (Oncology) [name], MD as Consulting Physician (Obstetrics and Gynecology) [name], MD as Consulting Physician (General Surgery) Rutherford Guys, MD as Consulting Physician (Ophthalmology) Minor, Orlie Pollen, DDS (Dentistry) Jannet Mantis, MD (Dermatology) Mickeal Skinner Acey Lav, MD as Consulting Physician (Psychiatry) Dawley, Theodoro Doing, DO as Consulting Physician Dawley, Theodoro Doing, DO as Consulting Physician   CHIEF COMPLAINT: metastatic breast cancer (s/p right mastectomy)  CURRENT TREATMENT: dexamethasone   INTERVAL HISTORY: Debra Mcintyre returns today for re-evaluation of her new metastatic breast cancer accompanied by her friend Lelon Frohlich.   Since her last visit here she met with Dr. Mickeal Skinner and then with [facility].  She had a repeat MRI of the brain with the stereotactic protocol and that showed the same of 3 lesions previously noted.  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  She is scheduled for surgery of the larger left temporal lesion tomorrow morning and is expected to be in the hospital a couple of days after that.  Her case is being represented at tumor conference 03/14/2021.  I do not think we will have a definitive pathology however on till mid next week.  Since her last visit here also she had a staging CT scan of the chest 03/01/2021.  This showed a 2.0 right upper lung nodule which looks like a lung primary.  There are some other 0.5 cm or less lesions some of which had been previously noted and are stable and some of which have grown slightly.  These may be unrelated.  There is no evidence of bony or liver involvement on this test.  She also had a bone scan on 03/06/2021 which showed no evidence of  bony metastatic disease.  Recall her most recent left screening mammogram from 09/24/2020 was negative.  We did check a CA 27-29 and it was mildly elevated at 65.0   REVIEW OF SYSTEMS: Deshea has not seen any improvement on her left facial paresis despite steroids.  She had many questions regarding the lung and repeat brain findings and possible treatment.  She is looking forward to surgery tomorrow.  She tells me her husband would not be able to care for her--he has not been involved in any of her visits here--and she is wondering if she will need some nursing help or physical therapy when she goes home.  She has had no unusual headaches no nausea or vomiting no balance issues and no falls.  A detailed review of systems today was otherwise stable   COVID 19 VACCINATION STATUS: Pfizer x 2  plus 2 boosters, most recently June 2020   BREAST CANCER HISTORY: From the original intake note:  Margi had screening mammography at [facility] 04/10/2012 raising the question of some axillary lymph nodes on the right. Additional views of the right axilla and right axillary ultrasound performed 04/04/2012 showed 3 lymph nodes that appeared larger than prior. The largest measured 1.5 cm. 2 of them had cortical thickening. Close followup was suggested, and repeat right axillary ultrasound 06/25/2012 showed no significant change in the lymph nodes in question. Followup ultrasound in 3 months was recommended, but in the interim the patient saw her primary physician, [name] and ADL, and he set her up for a chest CT on 07/08/2012, which showed  a mild asymmetric density in the upper mid right breast. There was a prominent right axillary lymph node measuring 1.9 cm, with a few smaller adjacent nodes asymmetric with compared to the left side. Again, short interval followup was recommended, and on 10/07/2012 the patient had digital right mammography and ultrasonography, now at the breast Center. Dr. [name] noted pleomorphic  calcifications spanning an area of 6.3 cm without associated mass. Physical exam was unremarkable. Ultrasound showed an area of adenopathy in the right axilla measuring 1.6 cm, with a second lymph node with a thickened cortex measuring 1.2 cm. No suspicious mass was seen by ultrasound in the right breast.  Biopsy of the larger axillary lymph node on 10/07/2012 showed ([id number]) an invasive ductal carcinoma, triple negative, with an MIB-1 of 66%. Biopsy of the right breast the next day, [id number]) showed invasive ductal carcinoma, grade 3. Breast MRI 10/14/2012 showed a total area of irregular enhancement in the right breast measuring up to 12 cm. MRI guided biopsy of an area in the upper inner quadrant of the right breast on 10/23/2012 showed ([id number]) ductal carcinoma in situ, with foci worrisome for invasion.  The patient's subsequent history is as detailed below   PAST MEDICAL HISTORY: Past Medical History:  Diagnosis Date   Anemia    Arthritis    Breast cancer ([place]) 09/2012   right breast/right axillary lymph node t2,pn1, stage 11b, invasive ductal carcinma, grade 3, triple negative, with an mib-1 of 15%   Cataract    bilateral removed   Cerebral aneurysm, nonruptured    Cerebrovascular disease, unspecified    Colonic polyp    Dizziness    pt. states that she experiences this occassionally 05/03/15   Headache    occassionally   Heart murmur    History of chemotherapy    Hx of radiation therapy 05/27/13-08/13/13   right chest wall/right supraclavicular/axillary region 5220 cGy 29 sessions, right mastectomy/chest wall boost cGy 5 sessions   Hypertension    Does not see a cardiologist   Hypothyroidism    Inguinal hernia    Neuropathy    Pure hypercholesterolemia    Rosacea    Seasonal allergies    Stroke ([place]) 2006   no deficits - TIA   TIA (transient ischemic attack)    Wears glasses     PAST SURGICAL HISTORY: Past Surgical History:  Procedure Laterality Date    BREAST BIOPSY Right 10/23/2012   BREAST BIOPSY Right 10/08/2012   BREAST BIOPSY Left 08/13/2019   REACTIVE LYMPH NODE WITH FOLLICULAR HYPERPLASIA   COLONOSCOPY     COLONOSCOPY W/ POLYPECTOMY     ESOPHAGOGASTRODUODENOSCOPY     Left middle cerebral artery angioplasty  2004   by [name] had 2 follow up occurances   MASTECTOMY Right    MASTECTOMY W/ SENTINEL NODE BIOPSY Right 04/23/2013   Procedure: RIGHT TOTAL MASTECTOMY WITH SENTINEL LYMPH NODE BIOPSY;  Surgeon: [name], MD;  Location: [facility];  Service: General;  Laterality: Right;   PORTACATH PLACEMENT N/A 11/05/2012   Procedure: INSERTION PORT-A-CATH;  Surgeon: [name], MD;  Location: [facility];  Service: General;  Laterality: N/A;   RADIOLOGY WITH ANESTHESIA N/A 05/04/2015   Procedure: MRI BRAIN WITH AND WITHOUT;  Surgeon: Medication Radiologist, MD;  Location: [facility];  Service: Radiology;  Laterality: N/A;    FAMILY HISTORY Family History  Problem Relation Age of Onset   Lung cancer Mother 28   Heart disease Father    Colon cancer  Brother 72   Alcohol abuse Brother    Colon cancer Maternal Uncle        dx in his 75s   Colon cancer Maternal Aunt    Ovarian cancer Other 47   Lung cancer Maternal Aunt    Breast cancer Sister    Rectal cancer Neg Hx    Stomach cancer Neg Hx    Esophageal cancer Neg Hx    the patient's father died at the age of 61. The patient's mother died at the age of 22 from lung cancer. She was not a smoker. The cancer was diagnosed when she was 78 years old. The patient's mother had 2 sisters and one brother. One of those sisters had lung cancer and a brother had colon cancer, both diagnosed in their 69s. The patient herself has one brother who died from colon cancer at the age of 8. That brother has a daughter who was diagnosed with uterine cancer at the age of 68. All this raises the question of possible Lynch syndrome and I have referred the patient for genetics  counseling. The patient does have one sister who was diagnosed with breast cancer in 2015.   GYNECOLOGIC HISTORY: Menarche age 80, menopause around 81. The patient is GX P0. She took birth control pills for many years with no complications.   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 Her stepchildren are [name] who lives in "[place]" and is financial vice president of [employer], and V[place]ia who lives in [place] and works as a Tree surgeon. The patient has 5 grandchildren including a granddaughter who is Passenger transport manager for [employer]   ADVANCED DIRECTIVES: Not in place; at the 02/17/2021 visit the patient was given the appropriate forms to complete and notarized at her discretion.  She is planning to name her friend Gladstone Lighter as healthcare power of attorney   HEALTH MAINTENANCE: Social History   Tobacco Use   Smoking status: Former    Packs/day: 1.00    Years: 20.00    Pack years: 20.00    Types: Cigarettes    Quit date: 04/02/1986    Years since quitting: 34.9   Smokeless tobacco: Never  Vaping Use   Vaping Use: Never used  Substance Use Topics   Alcohol use: No   Drug use: No     Colonoscopy: 02/10/2015  PAP: 2012  Bone density: 05/01/2017, T-score of -0.4.  Lipid panel: 06/22/2017  No Known Allergies  Current Outpatient Medications  Medication Sig Dispense Refill   acetaminophen (TYLENOL) 500 MG tablet Take 1,000 mg by mouth every 6 (six) hours as needed (for headaches).     Calcium Carbonate (CALTRATE 600 PO) Take 1 tablet by mouth in the morning and at bedtime.     clopidogrel (PLAVIX) 75 MG tablet TAKE 1 TABLET BY MOUTH DAILY (Patient taking differently: Take 75 mg by mouth at bedtime.) 90 tablet 1   dexamethasone (DECADRON) 4 MG tablet Take 1 tablet (4 mg total) by mouth daily. (Patient taking differently: Take 2 mg by mouth at bedtime.) 30 tablet 0    gabapentin (NEURONTIN) 300 MG capsule Take 1 capsule (300 mg total) by mouth at bedtime. 90 capsule 4   Glycerin-Polysorbate 80 (REFRESH DRY EYE THERAPY OP) Place 1 drop into both eyes daily as needed (Dry eye).     levothyroxine (SYNTHROID) 137 MCG tablet TAKE 1 TABLET(137 MCG) BY MOUTH  DAILY (Patient taking differently: Take 137 mcg by mouth daily before breakfast.) 90 tablet 1   lisinopril (ZESTRIL) 20 MG tablet TAKE 1 TABLET(20 MG) BY MOUTH DAILY (Patient taking differently: Take 10 mg by mouth at bedtime.) 90 tablet 1   metoprolol succinate (TOPROL-XL) 100 MG 24 hr tablet TAKE 1 TABLET BY MOUTH EVERY DAY WITH OR IMMEDIATELY FOLLOWING A MEAL (Patient taking differently: Take 50 mg by mouth at bedtime.) 90 tablet 1   Multiple Vitamins-Minerals (CENTRUM SILVER ULTRA WOMENS) TABS Take 1 tablet by mouth at bedtime.     psyllium (METAMUCIL) 58.6 % powder Take 1 packet by mouth daily as needed (Constipation).     rosuvastatin (CRESTOR) 20 MG tablet TAKE 1 TABLET BY MOUTH DAILY (Patient taking differently: Take 20 mg by mouth at bedtime.) 90 tablet 0   sertraline (ZOLOFT) 100 MG tablet TAKE 1 TABLET(100 MG) BY MOUTH DAILY (Patient taking differently: Take 100 mg by mouth in the morning.) 90 tablet 1   No current facility-administered medications for this visit.    OBJECTIVE: White woman who appears younger than stated age  18:   03/10/21 1603  BP: 113/68  Pulse: 75  Resp: 18  Temp: 97.7 F (36.5 C)  SpO2: 95%      Body mass index is 24.24 kg/m.    ECOG FS: 1 Filed Weights   03/10/21 1603  Weight: 159 lb 6.4 oz (72.3 kg)   Left facial paresis is unchanged  Full physical exam was not repeated today   LAB RESULTS: Lab Results  Component Value Date   WBC 12.1 (H) 02/17/2021   NEUTROABS 8.9 (H) 02/17/2021   HGB 13.2 02/17/2021   HCT 37.8 02/17/2021   MCV 86.1 02/17/2021   PLT 271 02/17/2021      Chemistry      Component Value Date/Time   NA 136 02/17/2021 1529   NA 141  03/21/2017 1105   K 3.8 02/17/2021 1529   K 4.3 03/21/2017 1105   CL 101 02/17/2021 1529   CL 102 01/28/2013 0909   CO2 24 02/17/2021 1529   CO2 25 03/21/2017 1105   BUN 20 02/17/2021 1529   BUN 12.5 03/21/2017 1105   CREATININE 0.78 02/17/2021 1529   CREATININE 0.79 06/30/2020 1446   CREATININE 0.7 03/21/2017 1105      Component Value Date/Time   CALCIUM 9.6 02/17/2021 1529   CALCIUM 9.8 03/21/2017 1105   ALKPHOS 57 02/17/2021 1529   ALKPHOS 48 03/21/2017 1105   AST 17 02/17/2021 1529   AST 21 03/21/2017 1105   ALT 19 02/17/2021 1529   ALT 21 03/21/2017 1105   BILITOT 0.6 02/17/2021 1529   BILITOT 0.86 03/21/2017 1105      STUDIES: CT Chest W Contrast  Result Date: 03/01/2021 CLINICAL DATA:  Brain lesions noted on recent brain MRI. Evaluate for metastatic disease. History of breast cancer. EXAM: CT CHEST WITH CONTRAST TECHNIQUE: Multidetector CT imaging of the chest was performed during intravenous contrast administration. CONTRAST:  47m OMNIPAQUE IOHEXOL 350 MG/ML SOLN COMPARISON:  CT scan 10/29/2013 FINDINGS: Cardiovascular: The heart is normal in size. No pericardial effusion. There is tortuosity, ectasia and calcification of the thoracic aorta. No dissection. Mediastinum/Nodes: Enlarged mediastinal and right hilar lymph nodes are noted. Right paratracheal node on image number 45/2 measures 13 mm. Lower right paratracheal node on image 54/2 measures 11 mm. Subcarinal node on image number 69/2 measures 11 mm. Right hilar/infrahilar node on image 76/2 measures 12.5 mm. There is a epicardial  node just above the left hemidiaphragm on image 139/2 the measures 15 mm. The esophagus is grossly normal. Lungs/Pleura: Peripheral right upper lobe pulmonary lesion on image number 95/5 measures 2.0 x 1.5 cm and is slightly irregular/spiculated margins. This is more worrisome from a primary lung neoplasm then a metastatic lesion from breast cancer. 5 mm subpleural nodule in the right lower lobe  is unchanged since the prior CT scan and most likely a subpleural lymph node. 4.5 mm subpleural nodule on image number 85/5 previously measured 3 mm. 2 mm subpleural nodule in the left lower lobe on image number 97/5 appears stable. Upper Abdomen: No significant upper abdominal findings. No evidence of hepatic or adrenal gland metastasis. Stable aortic calcifications. Musculoskeletal: Status post right mastectomy. No findings suspicious for recurrent chest wall tumor, axillary or supraclavicular adenopathy. The left breast is grossly normal by CT. Scoliosis and advanced degenerative changes involving the spine. No obvious lytic or sclerotic bone lesions. IMPRESSION: 1. 2.0 x 1.5 cm peripheral right upper lobe pulmonary lesion most suspicious for primary lung neoplasm. 2. Enlarged mediastinal and right hilar lymph nodes suspicious for metastatic disease. 3. Stable small bilateral pulmonary nodules. 4. No findings for upper abdominal metastatic disease or osseous metastatic disease. 5. PET-CT may be helpful for further evaluation but the right lung lesion may require biopsy. 6. Aortic atherosclerosis. Aortic Atherosclerosis (ICD10-I70.0). Electronically Signed   By: Marijo Sanes M.D.   On: 03/01/2021 16:29   MR BRAIN W WO CONTRAST  Result Date: 02/26/2021 CLINICAL DATA:  History of breast cancer.  Preop. EXAM: MRI HEAD WITHOUT AND WITH CONTRAST TECHNIQUE: Multiplanar, multiecho pulse sequences of the brain and surrounding structures were obtained without and with intravenous contrast. CONTRAST:  87m GADAVIST GADOBUTROL 1 MMOL/ML IV SOLN COMPARISON:  02/10/2021 FINDINGS: Brain: 3 enhancing brain lesions are again identified: *2.4 cm in the lateral left cerebellum. *3.2 cm in the inferior left temporal lobe *1.1 cm in the right occipital temporal cortical region. Masses all have necrotic features with mild adjacent edema. The left temporal lobe mass is notable for inferior dural infiltration and extension along the  facial nerve at the level of the geniculate ganglion. Proximally there is extension into the deep canalicular segment and distally both the horizontal and vertical segments appear asymmetrically enhancing, reaching the stylomastoid foramina. This correlates with history of facial weakness the left. Chronic small vessel ischemia in the hemispheric white matter. Age normal brain volume. No hydrocephalus or collection Vascular: Normal flow voids and vascular enhancements. Skull and upper cervical spine: Cervical spine degeneration. No evident bone lesion Sinuses/Orbits: Bilateral cataract resection. 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 The left temporal metastasis is notable for inferior dural infiltration with tracking along the left facial nerve, extending both directions from the geniculate ganglion. Electronically Signed   By: JMonte FantasiaM.D.   On: 02/26/2021 18:15   MR BRAIN W WO CONTRAST  Result Date: 02/10/2021 CLINICAL DATA:  Stroke follow-up. Facial asymmetry of weakness to left upper eyelid. Headache. History of breast cancer 2014 EXAM: MRI HEAD WITHOUT AND WITH CONTRAST TECHNIQUE: Multiplanar, multiecho pulse sequences of the brain and surrounding structures were obtained without and with intravenous contrast. CONTRAST:  751mGADAVIST GADOBUTROL 1 MMOL/ML IV SOLN COMPARISON:  MRI head 05/04/2015 FINDINGS: Brain: 24 mm enhancing mass lesion in the right lateral cerebellum with central necrosis and surrounding edema. No displacement of the fourth ventricle. 30 mm enhancing mass lesion left inferior temporal lobe with central necrosis and mild edema. 10 mm  enhancing mass lesion right lateral occipital cortex with mild edema. Multiple enhancing lesions are present compatible with metastatic disease. Ventricle size normal. No acute infarct. Scattered small deep white matter hyperintensities bilaterally most compatible with chronic microvascular ischemia. Negative for intracranial  hemorrhage Vascular: Normal arterial flow voids Skull and upper cervical spine: No focal skeletal lesion. Sinuses/Orbits: Mild mucosal edema paranasal sinuses. Bilateral cataract extraction Other: None IMPRESSION: Three enhancing mass lesions the brain compatible with metastatic disease in the right cerebellum, left temporal lobe, and right lateral occipital lobe. Mild surrounding edema. Moderate chronic microvascular ischemic change in the white matter. Electronically Signed   By: Franchot Gallo M.D.   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 EXAM: NUCLEAR MEDICINE WHOLE BODY BONE SCAN TECHNIQUE: Whole body anterior and posterior images were obtained approximately 3 hours after intravenous injection of radiopharmaceutical. RADIOPHARMACEUTICALS:  40.8 millicurie mCi [id number] MDP IV COMPARISON:  CT scan of the chest 02/28/2021 FINDINGS: Focal uptake in the distal femur and proximal tibia corresponding with the lateral compartment of the right knee. Additional small foci of increased radiotracer uptake noted in the bilateral wrists, ankles, and shoulders consistent with degenerative uptake. The patient has fairly significant dextroconvex scoliosis of the thoracolumbar spine. Multiple small foci of increased radiotracer uptake throughout the spine corresponding with areas of degenerative change. No focal abnormal increased uptake suggestive of metastatic disease. Normal urinary excretion. IMPRESSION: 1. Negative for osseous metastatic disease. 2. Focal uptake in the lateral compartment of the right knee suggests underlying osteoarthritis. 3. Dextroconvex scoliosis of the thoracolumbar spine. Electronically Signed   By: Jacqulynn Cadet M.D.   On: 03/06/2021 11:02   DG Foot 2 Views Left  Result Date: 03/09/2021 Please see detailed radiograph report in office note.    ASSESSMENT: 80 y.o.  [place] woman   (1)  status post right breast upper outer quadrant and right axillary lymph node biopsies February 2014 of a clinical T2, pN1, stage IIB  invasive ductal carcinoma, grade 3, triple negative, with an MIB-1 of 15%  (2)  a third biopsy from a different quadrant 10/23/2012 showed ductal carcinoma in situ  (3)  treated in the neoadjuvant setting with 4 dose dense cycles of doxorubicin/ cyclophosphamide 12/23/2012, followed by 7 weekly doses of paclitaxel and completed 03/03/2013.  (4) status post right mastectomy and sentinel lymph node sampling 04/23/2013 for a residual ypT1b ypN1 invasive ductal carcinoma, grade 3, again triple negative (A) a total of 10 right axillary lymph nodes were removed, 2 with micrometastatic deposits  (B) the patient opted against reconstruction  (5) adjuvant radiation completed 08/13/2013  (6) the patient underwent genetics counseling but declined testing  METASTATIC DISEASE: June 2022  (A) brain MRI 02/10/2021 shows 3 separate brain masses compatible with metastatic disease (B) brain MRI for stereotactic planning protocol 02/24/2021 shows the same 3 lesions, with a left temporal metastasis tracking along the left facial nerve (C) CT scan of the chest 08/31/2020 shows a 2.0 right upper lobe nodule suspicious for primary lung cancer, with enlarged mediastinal and right hilar lymph nodes. 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  Perhaps some radiation to that area could be helpful.  At  any rate she is scheduled for surgery tomorrow and we will have pathologic diagnosis within a week.  We reviewed the CT scan findings.  If I did not know her history of breast cancer I would say she has a primary lung tumor and she does have a  remote history of smoking.  The CA 27-29 is only minimally elevated and can be produced by other tumors than breast so that does not really tell us what we are seeing.  I am going to assume that the same process is at work in the lungs and brain.  Certainly if the brain tumor is consistent with a lung primary that would be definitive.  She knows that in that case I would refer her to Dr. Earlie Server who is our lung cancer specialist.  If the brain tumor is consistent with breast, then I would treat her systemically for breast cancer and follow the lung nodule.  If the lung nodule failed to respond then we would proceed to biopsy.  She understands that stage IV breast or stage IV lung cancer is not curable.  The goal of treatment is control. I have given her the appropriate papers to change her healthcare power of attorney determination and she is planning to name her friend Lelon Frohlich as her healthcare power of attorney, as her husband from the patient's description would not be able to really make decisions for her or care for her--she is pretty much his caregiver.    She has discussed the brain issue with the rest of the family but not the lung findings to date.  I will FaceTime with her 03/17/2021 by which time I hope the definitive pathology from tomorrow surgery.  Total encounter time 45 minutes.Sarajane Jews C. Kendrik Mcshan MD Oncology and Hematology [facility] [place], [place] [address] Tel. [phone]  Joylene Igo [phone]   I, Wilburn Mylar, am acting as scribe for Dr. Sarajane Jews C. Cambell Stanek.  I, Lurline Del MD, have reviewed the above documentation for accuracy and completeness, and I agree with the above.    *Total Encounter Time as defined by the Centers for Medicare and Medicaid Services includes, in addition to the face-to-face time of a patient visit (documented in the note above) non-face-to-face time: obtaining and reviewing outside history, ordering and  reviewing medications, tests or procedures, care coordination (communications with other health care professionals or caregivers) and documentation in the medical record.

## 2021-03-10 NOTE — Progress Notes (Signed)
Spoke with pt today informing her of her arrival time for surgery tomorrow. Instructed her to be here at 5:30 AM. Instructed her to follow the instructions she was given when she came in for her PAT appt. (Surgery date and time had changed since the PAT appt).   Covid test done today. Pt denies any recent Covid symptoms.

## 2021-03-11 ENCOUNTER — Inpatient Hospital Stay (HOSPITAL_COMMUNITY)
Admission: RE | Admit: 2021-03-11 | Discharge: 2021-03-15 | DRG: 027 | Disposition: A | Discharge status: Home / Self Care | Payer: Medicare Other | Attending: Neurological Surgery | Admitting: Neurological Surgery

## 2021-03-11 ENCOUNTER — Telehealth: Payer: Self-pay | Admitting: Oncology

## 2021-03-11 ENCOUNTER — Encounter (HOSPITAL_COMMUNITY)
Admission: RE | Disposition: A | Discharge status: Home / Self Care | Payer: Self-pay | Source: Home / Self Care | Attending: Neurological Surgery

## 2021-03-11 ENCOUNTER — Inpatient Hospital Stay (HOSPITAL_COMMUNITY): Payer: Medicare Other | Admitting: Certified Registered Nurse Anesthetist

## 2021-03-11 ENCOUNTER — Inpatient Hospital Stay (HOSPITAL_COMMUNITY): Payer: Medicare Other | Admitting: Physician Assistant

## 2021-03-11 ENCOUNTER — Other Ambulatory Visit: Payer: Self-pay

## 2021-03-11 ENCOUNTER — Encounter (HOSPITAL_COMMUNITY): Payer: Self-pay | Admitting: Neurological Surgery

## 2021-03-11 DIAGNOSIS — Z20822 Contact with and (suspected) exposure to covid-19: Secondary | ICD-10-CM | POA: Diagnosis present

## 2021-03-11 DIAGNOSIS — Z7952 Long term (current) use of systemic steroids: Secondary | ICD-10-CM

## 2021-03-11 DIAGNOSIS — E039 Hypothyroidism, unspecified: Secondary | ICD-10-CM | POA: Diagnosis present

## 2021-03-11 DIAGNOSIS — Z923 Personal history of irradiation: Secondary | ICD-10-CM

## 2021-03-11 DIAGNOSIS — Z7902 Long term (current) use of antithrombotics/antiplatelets: Secondary | ICD-10-CM | POA: Diagnosis not present

## 2021-03-11 DIAGNOSIS — I679 Cerebrovascular disease, unspecified: Secondary | ICD-10-CM | POA: Diagnosis present

## 2021-03-11 DIAGNOSIS — G629 Polyneuropathy, unspecified: Secondary | ICD-10-CM | POA: Diagnosis present

## 2021-03-11 DIAGNOSIS — Z87891 Personal history of nicotine dependence: Secondary | ICD-10-CM | POA: Diagnosis not present

## 2021-03-11 DIAGNOSIS — G939 Disorder of brain, unspecified: Secondary | ICD-10-CM | POA: Diagnosis present

## 2021-03-11 DIAGNOSIS — E7800 Pure hypercholesterolemia, unspecified: Secondary | ICD-10-CM | POA: Diagnosis present

## 2021-03-11 DIAGNOSIS — G9389 Other specified disorders of brain: Secondary | ICD-10-CM | POA: Diagnosis not present

## 2021-03-11 DIAGNOSIS — Z8249 Family history of ischemic heart disease and other diseases of the circulatory system: Secondary | ICD-10-CM

## 2021-03-11 DIAGNOSIS — R011 Cardiac murmur, unspecified: Secondary | ICD-10-CM | POA: Diagnosis present

## 2021-03-11 DIAGNOSIS — C7931 Secondary malignant neoplasm of brain: Secondary | ICD-10-CM | POA: Diagnosis present

## 2021-03-11 DIAGNOSIS — I639 Cerebral infarction, unspecified: Secondary | ICD-10-CM | POA: Diagnosis not present

## 2021-03-11 DIAGNOSIS — Z86010 Personal history of colonic polyps: Secondary | ICD-10-CM

## 2021-03-11 DIAGNOSIS — Z9221 Personal history of antineoplastic chemotherapy: Secondary | ICD-10-CM | POA: Diagnosis not present

## 2021-03-11 DIAGNOSIS — Z8673 Personal history of transient ischemic attack (TIA), and cerebral infarction without residual deficits: Secondary | ICD-10-CM | POA: Diagnosis not present

## 2021-03-11 DIAGNOSIS — G510 Bell's palsy: Secondary | ICD-10-CM | POA: Diagnosis present

## 2021-03-11 DIAGNOSIS — L719 Rosacea, unspecified: Secondary | ICD-10-CM | POA: Diagnosis present

## 2021-03-11 DIAGNOSIS — Z79890 Hormone replacement therapy (postmenopausal): Secondary | ICD-10-CM

## 2021-03-11 DIAGNOSIS — Z9011 Acquired absence of right breast and nipple: Secondary | ICD-10-CM | POA: Diagnosis not present

## 2021-03-11 DIAGNOSIS — Z8679 Personal history of other diseases of the circulatory system: Secondary | ICD-10-CM | POA: Diagnosis not present

## 2021-03-11 DIAGNOSIS — Z803 Family history of malignant neoplasm of breast: Secondary | ICD-10-CM

## 2021-03-11 DIAGNOSIS — Z79899 Other long term (current) drug therapy: Secondary | ICD-10-CM

## 2021-03-11 DIAGNOSIS — I10 Essential (primary) hypertension: Secondary | ICD-10-CM | POA: Diagnosis present

## 2021-03-11 DIAGNOSIS — C712 Malignant neoplasm of temporal lobe: Secondary | ICD-10-CM | POA: Diagnosis not present

## 2021-03-11 DIAGNOSIS — D496 Neoplasm of unspecified behavior of brain: Secondary | ICD-10-CM | POA: Diagnosis present

## 2021-03-11 DIAGNOSIS — Z853 Personal history of malignant neoplasm of breast: Secondary | ICD-10-CM

## 2021-03-11 DIAGNOSIS — M810 Age-related osteoporosis without current pathological fracture: Secondary | ICD-10-CM | POA: Diagnosis not present

## 2021-03-11 DIAGNOSIS — G936 Cerebral edema: Secondary | ICD-10-CM | POA: Diagnosis not present

## 2021-03-11 HISTORY — PX: CRANIOTOMY: SHX93

## 2021-03-11 HISTORY — PX: APPLICATION OF CRANIAL NAVIGATION: SHX6578

## 2021-03-11 LAB — ABO/RH: ABO/RH(D): A POS

## 2021-03-11 LAB — CBC
HCT: 38.4 % (ref 36.0–46.0)
Hemoglobin: 12.4 g/dL (ref 12.0–15.0)
MCH: 29.5 pg (ref 26.0–34.0)
MCHC: 32.3 g/dL (ref 30.0–36.0)
MCV: 91.4 fL (ref 80.0–100.0)
Platelets: 149 10*3/uL — ABNORMAL LOW (ref 150–400)
RBC: 4.2 MIL/uL (ref 3.87–5.11)
RDW: 15.1 % (ref 11.5–15.5)
WBC: 7.7 10*3/uL (ref 4.0–10.5)
nRBC: 0 % (ref 0.0–0.2)

## 2021-03-11 LAB — CREATININE, SERUM
Creatinine, Ser: 0.69 mg/dL (ref 0.44–1.00)
GFR, Estimated: >60 mL/min (ref >60)

## 2021-03-11 LAB — MRSA NEXT GEN BY PCR, NASAL: MRSA by PCR Next Gen: NOT DETECTED

## 2021-03-11 SURGERY — CRANIOTOMY TUMOR EXCISION
Anesthesia: General | Laterality: Left

## 2021-03-11 MED ORDER — SODIUM CHLORIDE 0.9 % IV SOLN: INTRAVENOUS | Status: DC | PRN

## 2021-03-11 MED ORDER — CEFAZOLIN SODIUM-DEXTROSE 2-4 GM/100ML-% IV SOLN
2.0000 g | INTRAVENOUS | Status: AC
  Administered 2021-03-11: 2 g via INTRAVENOUS
  Filled 2021-03-11: qty 100

## 2021-03-11 MED ORDER — PHENYLEPHRINE 40 MCG/ML (10ML) SYRINGE FOR IV PUSH (FOR BLOOD PRESSURE SUPPORT)
PREFILLED_SYRINGE | INTRAVENOUS | Status: AC
  Filled 2021-03-11: qty 10

## 2021-03-11 MED ORDER — SERTRALINE HCL 100 MG PO TABS
100.0000 mg | ORAL_TABLET | Freq: Every day | ORAL | Status: DC
  Administered 2021-03-11: 100 mg via ORAL
  Filled 2021-03-11 (×2): qty 1

## 2021-03-11 MED ORDER — LIDOCAINE-EPINEPHRINE 1 %-1:100000 IJ SOLN
INTRAMUSCULAR | Status: DC | PRN
  Administered 2021-03-11: 8.5 mL

## 2021-03-11 MED ORDER — ORAL CARE MOUTH RINSE: 15.0000 mL | Freq: Once | OROMUCOSAL | Status: AC

## 2021-03-11 MED ORDER — GABAPENTIN 300 MG PO CAPS
300.0000 mg | ORAL_CAPSULE | Freq: Every day | ORAL | Status: DC
  Administered 2021-03-11 – 2021-03-14 (×4): 300 mg via ORAL
  Filled 2021-03-11 (×4): qty 1

## 2021-03-11 MED ORDER — CHLORHEXIDINE GLUCONATE CLOTH 2 % EX PADS: 6.0000 | MEDICATED_PAD | Freq: Once | CUTANEOUS | Status: DC

## 2021-03-11 MED ORDER — CHLORHEXIDINE GLUCONATE 0.12 % MT SOLN
OROMUCOSAL | Status: AC
  Administered 2021-03-11: 15 mL via OROMUCOSAL
  Filled 2021-03-11: qty 15

## 2021-03-11 MED ORDER — SUGAMMADEX SODIUM 200 MG/2ML IV SOLN
INTRAVENOUS | Status: DC | PRN
  Administered 2021-03-11: 200 mg via INTRAVENOUS

## 2021-03-11 MED ORDER — LISINOPRIL 10 MG PO TABS
10.0000 mg | ORAL_TABLET | Freq: Every day | ORAL | Status: DC
  Administered 2021-03-11 – 2021-03-14 (×4): 10 mg via ORAL
  Filled 2021-03-11 (×4): qty 1

## 2021-03-11 MED ORDER — HYDROMORPHONE HCL 1 MG/ML IJ SOLN
0.5000 mg | INTRAMUSCULAR | Status: DC | PRN
  Administered 2021-03-11: 0.5 mg via INTRAVENOUS
  Filled 2021-03-11: qty 1

## 2021-03-11 MED ORDER — THROMBIN 20000 UNITS EX KIT
PACK | CUTANEOUS | Status: DC | PRN
  Administered 2021-03-11: 20 mL via TOPICAL

## 2021-03-11 MED ORDER — SODIUM CHLORIDE 0.9 % IV SOLN: INTRAVENOUS | Status: DC

## 2021-03-11 MED ORDER — PHENYLEPHRINE HCL-NACL 10-0.9 MG/250ML-% IV SOLN
INTRAVENOUS | Status: DC | PRN
  Administered 2021-03-11: 20 ug/min via INTRAVENOUS

## 2021-03-11 MED ORDER — SODIUM CHLORIDE 0.9 % IV SOLN
0.0125 ug/kg/min | INTRAVENOUS | Status: DC
  Filled 2021-03-11: qty 2000

## 2021-03-11 MED ORDER — PHENYLEPHRINE 40 MCG/ML (10ML) SYRINGE FOR IV PUSH (FOR BLOOD PRESSURE SUPPORT)
PREFILLED_SYRINGE | INTRAVENOUS | Status: DC | PRN
  Administered 2021-03-11: 80 ug via INTRAVENOUS

## 2021-03-11 MED ORDER — LIDOCAINE-EPINEPHRINE 1 %-1:100000 IJ SOLN
INTRAMUSCULAR | Status: AC
  Filled 2021-03-11: qty 1

## 2021-03-11 MED ORDER — METOPROLOL SUCCINATE ER 50 MG PO TB24
50.0000 mg | ORAL_TABLET | Freq: Every day | ORAL | Status: DC
  Administered 2021-03-11 – 2021-03-14 (×4): 50 mg via ORAL
  Filled 2021-03-11 (×4): qty 1

## 2021-03-11 MED ORDER — ONDANSETRON HCL 4 MG/2ML IJ SOLN: 4.0000 mg | INTRAMUSCULAR | Status: DC | PRN

## 2021-03-11 MED ORDER — LEVETIRACETAM IN NACL 500 MG/100ML IV SOLN
500.0000 mg | Freq: Two times a day (BID) | INTRAVENOUS | Status: DC
  Administered 2021-03-11 – 2021-03-12 (×3): 500 mg via INTRAVENOUS
  Filled 2021-03-11 (×3): qty 100

## 2021-03-11 MED ORDER — LABETALOL HCL 5 MG/ML IV SOLN
10.0000 mg | INTRAVENOUS | Status: DC | PRN
  Administered 2021-03-11: 20 mg via INTRAVENOUS
  Filled 2021-03-11: qty 4

## 2021-03-11 MED ORDER — POLYVINYL ALCOHOL 1.4 % OP SOLN: 1.0000 [drp] | Freq: Four times a day (QID) | OPHTHALMIC | Status: DC | PRN

## 2021-03-11 MED ORDER — ONDANSETRON HCL 4 MG/2ML IJ SOLN
INTRAMUSCULAR | Status: DC | PRN
  Administered 2021-03-11: 4 mg via INTRAVENOUS

## 2021-03-11 MED ORDER — FENTANYL CITRATE (PF) 250 MCG/5ML IJ SOLN
INTRAMUSCULAR | Status: AC
  Filled 2021-03-11: qty 5

## 2021-03-11 MED ORDER — CHLORHEXIDINE GLUCONATE 0.12 % MT SOLN: 15.0000 mL | Freq: Once | OROMUCOSAL | Status: AC

## 2021-03-11 MED ORDER — 0.9 % SODIUM CHLORIDE (POUR BTL) OPTIME
TOPICAL | Status: DC | PRN
  Administered 2021-03-11: 2000 mL
  Administered 2021-03-11: 1000 mL

## 2021-03-11 MED ORDER — LIDOCAINE 2% (20 MG/ML) 5 ML SYRINGE
INTRAMUSCULAR | Status: AC
  Filled 2021-03-11: qty 5

## 2021-03-11 MED ORDER — PROPOFOL 10 MG/ML IV BOLUS
INTRAVENOUS | Status: AC
  Filled 2021-03-11: qty 20

## 2021-03-11 MED ORDER — CEFAZOLIN SODIUM-DEXTROSE 1-4 GM/50ML-% IV SOLN
1.0000 g | Freq: Three times a day (TID) | INTRAVENOUS | Status: AC
  Administered 2021-03-11 – 2021-03-12 (×2): 1 g via INTRAVENOUS
  Filled 2021-03-11 (×2): qty 50

## 2021-03-11 MED ORDER — OXYCODONE HCL 5 MG PO TABS: 5.0000 mg | ORAL_TABLET | Freq: Once | ORAL | Status: DC | PRN

## 2021-03-11 MED ORDER — CHLORHEXIDINE GLUCONATE CLOTH 2 % EX PADS
6.0000 | MEDICATED_PAD | Freq: Once | CUTANEOUS | Status: AC
  Administered 2021-03-13: 6 via TOPICAL

## 2021-03-11 MED ORDER — ACETAMINOPHEN 500 MG PO TABS: 1000.0000 mg | ORAL_TABLET | Freq: Once | ORAL | Status: DC | PRN

## 2021-03-11 MED ORDER — HEPARIN SODIUM (PORCINE) 5000 UNIT/ML IJ SOLN
5000.0000 [IU] | Freq: Two times a day (BID) | INTRAMUSCULAR | Status: DC
  Administered 2021-03-12 – 2021-03-15 (×7): 5000 [IU] via SUBCUTANEOUS
  Filled 2021-03-11 (×7): qty 1

## 2021-03-11 MED ORDER — ROCURONIUM BROMIDE 10 MG/ML (PF) SYRINGE
PREFILLED_SYRINGE | INTRAVENOUS | Status: AC
  Filled 2021-03-11: qty 10

## 2021-03-11 MED ORDER — THROMBIN 5000 UNITS EX SOLR
CUTANEOUS | Status: AC
  Filled 2021-03-11: qty 5000

## 2021-03-11 MED ORDER — FENTANYL CITRATE (PF) 100 MCG/2ML IJ SOLN
25.0000 ug | INTRAMUSCULAR | Status: DC | PRN
  Administered 2021-03-11 (×2): 50 ug via INTRAVENOUS

## 2021-03-11 MED ORDER — FENTANYL CITRATE (PF) 100 MCG/2ML IJ SOLN
INTRAMUSCULAR | Status: AC
  Filled 2021-03-11: qty 2

## 2021-03-11 MED ORDER — DEXAMETHASONE SODIUM PHOSPHATE 10 MG/ML IJ SOLN
INTRAMUSCULAR | Status: AC
  Filled 2021-03-11: qty 1

## 2021-03-11 MED ORDER — ACETAMINOPHEN 160 MG/5ML PO SOLN: 1000.0000 mg | Freq: Once | ORAL | Status: DC | PRN

## 2021-03-11 MED ORDER — THROMBIN 5000 UNITS EX SOLR
OROMUCOSAL | Status: DC | PRN
  Administered 2021-03-11: 5 mL via TOPICAL

## 2021-03-11 MED ORDER — HYDROCODONE-ACETAMINOPHEN 5-325 MG PO TABS
1.0000 | ORAL_TABLET | ORAL | Status: DC | PRN
  Administered 2021-03-11 – 2021-03-15 (×10): 1 via ORAL
  Filled 2021-03-11 (×10): qty 1

## 2021-03-11 MED ORDER — FENTANYL CITRATE (PF) 250 MCG/5ML IJ SOLN
INTRAMUSCULAR | Status: DC | PRN
  Administered 2021-03-11: 100 ug via INTRAVENOUS
  Administered 2021-03-11: 50 ug via INTRAVENOUS

## 2021-03-11 MED ORDER — PROMETHAZINE HCL 12.5 MG PO TABS
12.5000 mg | ORAL_TABLET | ORAL | Status: DC | PRN
  Filled 2021-03-11: qty 2

## 2021-03-11 MED ORDER — ROSUVASTATIN CALCIUM 20 MG PO TABS
20.0000 mg | ORAL_TABLET | Freq: Every day | ORAL | Status: DC
  Administered 2021-03-11 – 2021-03-14 (×4): 20 mg via ORAL
  Filled 2021-03-11 (×4): qty 1

## 2021-03-11 MED ORDER — ACETAMINOPHEN 10 MG/ML IV SOLN: 1000.0000 mg | Freq: Once | INTRAVENOUS | Status: DC | PRN

## 2021-03-11 MED ORDER — EPHEDRINE 5 MG/ML INJ
INTRAVENOUS | Status: AC
  Filled 2021-03-11: qty 5

## 2021-03-11 MED ORDER — LEVOTHYROXINE SODIUM 25 MCG PO TABS
137.0000 ug | ORAL_TABLET | Freq: Every day | ORAL | Status: DC
  Administered 2021-03-12 – 2021-03-15 (×4): 137 ug via ORAL
  Filled 2021-03-11 (×4): qty 1

## 2021-03-11 MED ORDER — ROCURONIUM BROMIDE 10 MG/ML (PF) SYRINGE
PREFILLED_SYRINGE | INTRAVENOUS | Status: DC | PRN
  Administered 2021-03-11: 20 mg via INTRAVENOUS
  Administered 2021-03-11: 70 mg via INTRAVENOUS
  Administered 2021-03-11: 30 mg via INTRAVENOUS

## 2021-03-11 MED ORDER — ONDANSETRON HCL 4 MG/2ML IJ SOLN
INTRAMUSCULAR | Status: AC
  Filled 2021-03-11: qty 2

## 2021-03-11 MED ORDER — LACTATED RINGERS IV SOLN: INTRAVENOUS | Status: DC

## 2021-03-11 MED ORDER — DEXAMETHASONE SODIUM PHOSPHATE 10 MG/ML IJ SOLN
INTRAMUSCULAR | Status: DC | PRN
  Administered 2021-03-11: 10 mg via INTRAVENOUS

## 2021-03-11 MED ORDER — HEMOSTATIC AGENTS (NO CHARGE) OPTIME
TOPICAL | Status: DC | PRN
  Administered 2021-03-11: 1 via TOPICAL

## 2021-03-11 MED ORDER — SENNOSIDES-DOCUSATE SODIUM 8.6-50 MG PO TABS
1.0000 | ORAL_TABLET | Freq: Every evening | ORAL | Status: DC | PRN
  Administered 2021-03-13: 1 via ORAL
  Filled 2021-03-11: qty 1

## 2021-03-11 MED ORDER — ONDANSETRON HCL 4 MG PO TABS: 4.0000 mg | ORAL_TABLET | ORAL | Status: DC | PRN

## 2021-03-11 MED ORDER — LEVETIRACETAM IN NACL 1000 MG/100ML IV SOLN
1000.0000 mg | INTRAVENOUS | Status: AC
  Administered 2021-03-11: 1000 mg via INTRAVENOUS
  Filled 2021-03-11: qty 100

## 2021-03-11 MED ORDER — PROPOFOL 10 MG/ML IV BOLUS
INTRAVENOUS | Status: DC | PRN
  Administered 2021-03-11: 40 mg via INTRAVENOUS
  Administered 2021-03-11: 30 mg via INTRAVENOUS
  Administered 2021-03-11: 100 mg via INTRAVENOUS

## 2021-03-11 MED ORDER — OXYCODONE HCL 5 MG/5ML PO SOLN: 5.0000 mg | Freq: Once | ORAL | Status: DC | PRN

## 2021-03-11 MED ORDER — PANTOPRAZOLE SODIUM 40 MG IV SOLR
40.0000 mg | Freq: Every day | INTRAVENOUS | Status: DC
  Administered 2021-03-11: 40 mg via INTRAVENOUS
  Filled 2021-03-11: qty 40

## 2021-03-11 MED ORDER — EPHEDRINE SULFATE-NACL 50-0.9 MG/10ML-% IV SOSY
PREFILLED_SYRINGE | INTRAVENOUS | Status: DC | PRN
  Administered 2021-03-11 (×2): 10 mg via INTRAVENOUS

## 2021-03-11 MED ORDER — BACITRACIN ZINC 500 UNIT/GM EX OINT
TOPICAL_OINTMENT | CUTANEOUS | Status: DC | PRN
  Administered 2021-03-11: 1 via TOPICAL

## 2021-03-11 MED ORDER — MORPHINE SULFATE (PF) 2 MG/ML IV SOLN
1.0000 mg | INTRAVENOUS | Status: DC | PRN
  Administered 2021-03-11 – 2021-03-12 (×3): 2 mg via INTRAVENOUS
  Administered 2021-03-13: 1 mg via INTRAVENOUS
  Administered 2021-03-13 – 2021-03-14 (×3): 2 mg via INTRAVENOUS
  Filled 2021-03-11 (×7): qty 1

## 2021-03-11 MED ORDER — BUPIVACAINE-EPINEPHRINE (PF) 0.5% -1:200000 IJ SOLN
INTRAMUSCULAR | Status: DC | PRN
  Administered 2021-03-11: 8.5 mL

## 2021-03-11 MED ORDER — BACITRACIN ZINC 500 UNIT/GM EX OINT
TOPICAL_OINTMENT | CUTANEOUS | Status: AC
  Filled 2021-03-11: qty 28.35

## 2021-03-11 MED ORDER — REMIFENTANIL HCL 1 MG IV SOLR
0.0125 ug/kg/min | INTRAVENOUS | Status: AC
  Administered 2021-03-11: .2 ug/kg/min via INTRAVENOUS
  Administered 2021-03-11: .1 ug/kg/min via INTRAVENOUS
  Filled 2021-03-11: qty 2000

## 2021-03-11 MED ORDER — PROPOFOL 500 MG/50ML IV EMUL
INTRAVENOUS | Status: DC | PRN
  Administered 2021-03-11 (×2): 75 ug/kg/min via INTRAVENOUS

## 2021-03-11 SURGICAL SUPPLY — 92 items
BAG COUNTER SPONGE SURGICOUNT (BAG) ×4 IMPLANT
BAG SPNG CNTER NS LX DISP (BAG) ×3
BAND INSRT 18 STRL LF DISP RB (MISCELLANEOUS) ×2
BAND RUBBER #18 3X1/16 STRL (MISCELLANEOUS) ×4 IMPLANT
BIT DRILL WIRE PASS 1.3MM (BIT) IMPLANT
BLADE CLIPPER SURG (BLADE) ×2 IMPLANT
BUR CARBIDE MATCH 3.0 (BURR) ×2 IMPLANT
BUR SPIRAL ROUTER 2.3 (BUR) ×2 IMPLANT
CANISTER SUCT 3000ML PPV (MISCELLANEOUS) ×2 IMPLANT
CARTRIDGE OIL MAESTRO DRILL (MISCELLANEOUS) ×1 IMPLANT
CNTNR SPEC C3OZ STD GRAD LEK (MISCELLANEOUS) IMPLANT
CONT SPEC 3OZ W/LID STRL (MISCELLANEOUS) ×4
COVER BURR HOLE 14 (Orthopedic Implant) ×2 IMPLANT
DIFFUSER DRILL AIR PNEUMATIC (MISCELLANEOUS) ×1 IMPLANT
DRAIN JACKSON PRATT 1/4 1325 (MISCELLANEOUS) IMPLANT
DRAIN JACKSON RD 7FR 3/32 (WOUND CARE) IMPLANT
DRAPE MICROSCOPE LEICA (MISCELLANEOUS) ×2 IMPLANT
DRAPE NEUROLOGICAL W/INCISE (DRAPES) ×2 IMPLANT
DRAPE SHEET LG 3/4 BI-LAMINATE (DRAPES) ×2 IMPLANT
DRAPE SURG 17X23 STRL (DRAPES) IMPLANT
DRAPE WARM FLUID 44X44 (DRAPES) ×2 IMPLANT
DRILL WIRE PASS 1.3MM (BIT)
DRSG TELFA 3X8 NADH (GAUZE/BANDAGES/DRESSINGS) ×2 IMPLANT
DURAPREP 6ML APPLICATOR 50/CS (WOUND CARE) ×2 IMPLANT
ELECT COATED BLADE 2.86 ST (ELECTRODE) ×2 IMPLANT
ELECT REM PT RETURN 9FT ADLT (ELECTROSURGICAL) ×2
ELECTRODE REM PT RTRN 9FT ADLT (ELECTROSURGICAL) ×1 IMPLANT
EVACUATOR SILICONE 100CC (DRAIN) IMPLANT
FORCEPS BIPO MALIS IRRIG 9X1.5 (NEUROSURGERY SUPPLIES) ×1 IMPLANT
FORCEPS BIPOLAR SPETZLER 8 1.0 (NEUROSURGERY SUPPLIES) ×1 IMPLANT
GAUZE 4X4 16PLY ~~LOC~~+RFID DBL (SPONGE) ×1 IMPLANT
GAUZE SPONGE 4X4 12PLY STRL (GAUZE/BANDAGES/DRESSINGS) IMPLANT
GLOVE EXAM NITRILE XL STR (GLOVE) IMPLANT
GLOVE SRG 8 PF TXTR STRL LF DI (GLOVE) ×2 IMPLANT
GLOVE SURG LTX SZ8 (GLOVE) ×4 IMPLANT
GLOVE SURG UNDER POLY LF SZ8 (GLOVE) ×4
GOWN STRL REUS W/ TWL LRG LVL3 (GOWN DISPOSABLE) IMPLANT
GOWN STRL REUS W/ TWL XL LVL3 (GOWN DISPOSABLE) ×1 IMPLANT
GOWN STRL REUS W/TWL 2XL LVL3 (GOWN DISPOSABLE) IMPLANT
GOWN STRL REUS W/TWL LRG LVL3 (GOWN DISPOSABLE)
GOWN STRL REUS W/TWL XL LVL3 (GOWN DISPOSABLE) ×2
GRAFT DURAGEN MATRIX 3WX3L (Graft) ×2 IMPLANT
GRAFT DURAGEN MATRIX 3X3 SNGL (Graft) IMPLANT
HEMOSTAT POWDER KIT SURGIFOAM (HEMOSTASIS) ×2 IMPLANT
HEMOSTAT SNOW SURGICEL 2X4 (HEMOSTASIS) IMPLANT
HEMOSTAT SURGICEL 2X14 (HEMOSTASIS) ×2 IMPLANT
HEMOSTAT SURGICEL 2X4 FIBR (HEMOSTASIS) IMPLANT
HOOK RETRACTION 12 ELAST STAY (MISCELLANEOUS) ×4 IMPLANT
IV NS 1000ML (IV SOLUTION)
IV NS 1000ML BAXH (IV SOLUTION) ×1 IMPLANT
KIT BASIN OR (CUSTOM PROCEDURE TRAY) ×2 IMPLANT
KIT TURNOVER KIT B (KITS) ×2 IMPLANT
MARKER SPHERE PSV REFLC 13MM (MARKER) ×5 IMPLANT
NEEDLE HYPO 22GX1.5 SAFETY (NEEDLE) ×2 IMPLANT
NS IRRIG 1000ML POUR BTL (IV SOLUTION) ×4 IMPLANT
OIL CARTRIDGE MAESTRO DRILL (MISCELLANEOUS) ×2
PACK CRANIOTOMY CUSTOM (CUSTOM PROCEDURE TRAY) ×2 IMPLANT
PAD DRESSING TELFA 3X8 NADH (GAUZE/BANDAGES/DRESSINGS) IMPLANT
PATTIES SURGICAL .5 X.5 (GAUZE/BANDAGES/DRESSINGS) IMPLANT
PATTIES SURGICAL .5 X3 (DISPOSABLE) IMPLANT
PATTIES SURGICAL 1X1 (DISPOSABLE) IMPLANT
PERFORATOR LRG  14-11MM (BIT) ×2
PERFORATOR LRG 14-11MM (BIT) ×1 IMPLANT
PIN MAYFIELD SKULL DISP (PIN) ×2 IMPLANT
PLATE DOUBLE Y CMF 6H (Plate) ×2 IMPLANT
RETRACTOR LONE STAR DISPOSABLE (INSTRUMENTS) ×4 IMPLANT
SCREW UNIII AXS SD 1.5X4 (Screw) ×16 IMPLANT
SET CARTRIDGE AND TUBING (SET/KITS/TRAYS/PACK) IMPLANT
SET TUBING IRRIGATION DISP (TUBING) ×1 IMPLANT
SPONGE NEURO XRAY DETECT 1X3 (DISPOSABLE) IMPLANT
SPONGE SURGIFOAM ABS GEL 100 (HEMOSTASIS) ×2 IMPLANT
SPONGE T-LAP 4X18 ~~LOC~~+RFID (SPONGE) ×1 IMPLANT
STAPLER VISISTAT 35W (STAPLE) ×3 IMPLANT
STOCKINETTE 6  STRL (DRAPES)
STOCKINETTE 6 STRL (DRAPES) ×1 IMPLANT
STRIP CLOSURE SKIN 1/2X4 (GAUZE/BANDAGES/DRESSINGS) ×2 IMPLANT
SUT ETHILON 3 0 FSL (SUTURE) IMPLANT
SUT ETHILON 3 0 PS 1 (SUTURE) IMPLANT
SUT NURALON 4 0 TR CR/8 (SUTURE) ×4 IMPLANT
SUT VIC AB 0 CT1 18XCR BRD8 (SUTURE) ×1 IMPLANT
SUT VIC AB 0 CT1 8-18 (SUTURE) ×2
SUT VIC AB 2-0 CP2 18 (SUTURE) ×4 IMPLANT
SUT VICRYL RAPIDE 4/0 PS 2 (SUTURE) ×2 IMPLANT
TIP SHEAR CVD EXTENDED 36KH (INSTRUMENTS) IMPLANT
TOWEL GREEN STERILE (TOWEL DISPOSABLE) ×2 IMPLANT
TOWEL GREEN STERILE FF (TOWEL DISPOSABLE) ×2 IMPLANT
TRAY FOLEY MTR SLVR 16FR STAT (SET/KITS/TRAYS/PACK) ×2 IMPLANT
TUBE CONNECTING 12X1/4 (SUCTIONS) ×2 IMPLANT
TUBE CONNECTING 20X1/4 (TUBING) ×2 IMPLANT
UNDERPAD 30X36 HEAVY ABSORB (UNDERPADS AND DIAPERS) ×2 IMPLANT
WATER STERILE IRR 1000ML POUR (IV SOLUTION) ×2 IMPLANT
WRENCH TORQUE 36KHZ (INSTRUMENTS) IMPLANT

## 2021-03-11 NOTE — Progress Notes (Signed)
   Providing Compassionate, Quality Care - Together  NEUROSURGERY PROGRESS NOTE   S: pt s/e in pacu, complains of appropriate ha  O: EXAM:  BP (!) 147/64   Pulse 76   Temp (!) 97.1 F (36.2 C)   Resp 16   Ht 5\' 8"  (1.727 m)   Wt 72.3 kg   SpO2 98%   BMI 24.24 kg/m   Awake, alert PERRL Speech fluent, appropriate  CNs grossly intact  except L facial weakness from bells palsy 5/5 BUE/BLE  Incision c/d/i  ASSESSMENT:  80 y.o. female with   Metastatic brain lesions  S/p L crani for resection of temporal tumor on 03/11/2021  PLAN: - pt/ot -icu -pain control -sbp <150 -decadron -postop Mri tomorrow -keppra   Thank you for allowing me to participate in this patient's care.  Please do not hesitate to call with questions or concerns.   Elwin Sleight, [facility] Cell: [phone]

## 2021-03-11 NOTE — Anesthesia Procedure Notes (Signed)
Procedure Name: Intubation Date/Time: 03/11/2021 7:51 AM Performed by: Reece Agar, CRNA Pre-anesthesia Checklist: Patient identified, Emergency Drugs available, Suction available and Patient being monitored Patient Re-evaluated:Patient Re-evaluated prior to induction Oxygen Delivery Method: Circle System Utilized Preoxygenation: Pre-oxygenation with 100% oxygen Induction Type: IV induction Ventilation: Mask ventilation without difficulty Laryngoscope Size: Mac and 3 Grade View: Grade I Tube type: Oral Tube size: 7.0 mm Number of attempts: 1 Airway Equipment and Method: Stylet Placement Confirmation: ETT inserted through vocal cords under direct vision, positive ETCO2 and breath sounds checked- equal and bilateral Secured at: 21 cm Tube secured with: Tape Dental Injury: Teeth and Oropharynx as per pre-operative assessment

## 2021-03-11 NOTE — Progress Notes (Signed)
Belongings on arrival to 4NICU- clothing, shoes, 3 rings, sun glasses

## 2021-03-11 NOTE — Transfer of Care (Signed)
Immediate Anesthesia Transfer of Care Note  Patient: Debra Mcintyre  Procedure(s) Performed: LEFT FRONTOTEMPORAL CRANIOTOMY FOR RESECTION OF METASTATIC LESION (Left) APPLICATION OF CRANIAL NAVIGATION (Left)  Patient Location: PACU  Anesthesia Type:General  Level of Consciousness: awake  Airway & Oxygen Therapy: Patient Spontanous Breathing and Patient connected to face mask oxygen  Post-op Assessment: Report given to RN, Post -op Vital signs reviewed and stable and Patient moving all extremities X 4  Post vital signs: Reviewed and stable  Last Vitals:  Vitals Value Taken Time  BP 137/74 03/11/21 1140  Temp 36.2 C 03/11/21 1140  Pulse 78 03/11/21 1145  Resp 17 03/11/21 1145  SpO2 94 % 03/11/21 1145  Vitals shown include unvalidated device data.  Last Pain:  Vitals:   03/11/21 0605  TempSrc:   PainSc: 0-No pain      Patients Stated Pain Goal: 2 (04/79/98 7215)  Complications: No notable events documented.

## 2021-03-11 NOTE — Telephone Encounter (Signed)
Scheduled appt per 7/28 sch msg. Called pt, no answer. Left msg with appt date and time.

## 2021-03-11 NOTE — Op Note (Signed)
Providing Compassionate, Quality Care - Together Date of service: 03/11/2021  PREOP DIAGNOSIS:  Metastatic breast cancer  POSTOP DIAGNOSIS: Same  PROCEDURE: Left frontotemporal stereotactic craniotomy for resection of temporal tumor Intraoperative use of stereotaxy Intraoperative use of microscope for microdissection  SURGEON: Dr. Pieter Partridge C. Trevin Gartrell, DO  ASSISTANT: Dr. Duffy Rhody, MD  SECOND ASSISTANT: Weston Brass, NP  ANESTHESIA: General Endotracheal  EBL: 50 cc  SPECIMENS: Left temporal intra-axial tumor  DRAINS: None  COMPLICATIONS: None  CONDITION: Hemodynamically stable  HISTORY: Debra Mcintyre is a 80 y.o. female, right-handed, that presented with left Bell's palsy to the emergency department.  She has a history of breast cancer in 2014.  Upon work-up she was found to have multiple metastatic lesions, the largest being approximately 4 cm in her medial left temporal lobe.  Due to the size of this myself and the neuro-oncology team discussed with her about surgical resection versus radiation only.  I recommended surgical intervention given the size of the lesion, the patient agreed.  We discussed all risks, benefits and expected outcomes.  PROCEDURE IN DETAIL: The patient was brought to the operating room. After induction of general anesthesia, the patient was positioned on the operative table in the supine position.  Her head was placed in the [place] head holder rotated to the right to expose the left frontotemporal region.  The left frontotemporal region was clipped free of hair.  Neuro navigation was registered to the patient's preoperative MRI and verified with anatomical landmarks to excellent accuracy.  All pressure points were meticulously padded. Skin incision was then marked out in a standard frontotemporal region and prepped and draped in the usual sterile fashion.  Physician driven timeout was performed.  Using a 10 blade, incision was made down to the  pericranium.  Raney clips were applied.  Temporalis fascia was opened with Bovie electrocautery and a myocutaneous flap was reflected anteriorly.  Self-retaining retractors were placed in the wound.  Neuro navigation was used to verify frontal temporal craniotomy.  Using high-speed drill, frontotemporal craniotomy was performed in standard fashion elevated.  The dura was quite adherent to the bone flap was noted to tear in the inferior region near the temporal lobe.  Epidural hemostasis was achieved with bipolar cautery and Surgifoam.  A curvilinear durotomy was extended off the durotomy anteriorly and retracted with 4-0 Nurolon sutures.  Using neuro navigation, confirmation of the middle temporal gyrus was obtained and the planned trajectory for direct access to the mesial temporal lobe.  Using bipolar cautery and microscissors, corticotomy was made.  Using suction and bipolar cautery, white matter dissection was carried down to the capsule of the tumor.  The tumor was site only fibrous, and quite sticky to surrounding tissue.  The tumor was circumferentially disconnected from the surrounding white matter.  This was performed using bipolar cautery and microscissors and suction.  The tumor was quite adherent to the tentorial edge and tentorium itself.  This was disconnected with bipolar cautery and scraped clean with a Penfield 1.  The tentorial component was coagulated significantly with bipolar cautery.  Once the tumor had been circumferentially disconnected and somewhat debulked with suction, multiple specimens were taken for permanent pathology.  The tumor cavity was then explored and remaining capsule pieces were disconnected from surrounding white matter using bipolar cautery and suction.  All borders of the resection cavity were explored with neuro navigation and confirmed to be beyond the capsule edges.  Under direct visualization, all borders were explored and there  was white matter anteriorly superiorly  and posteriorly and then tentorium inferiorly.  Using Surgi-Flo and bipolar cautery, hemostasis was achieved in the resection cavity.  The resection cavity was copiously irrigated and noted to be excellently hemostatic.  The craniotomy flap was then plated with the cranial plating system.  The dura was closed with 4-0 Nurolon sutures.  DuraGen was placed over the craniotomy.  The bone flap was then replaced in its original position and fixed with the cranial plating system.  Self-retaining retractors were taken out of the wound.  Hemostasis was achieved with bipolar cautery.  Raney clips were removed.  Temporalis fascia was closed with 0 Vicryl sutures.  Briant Cedar was closed with 2-0 Vicryl sutures.  Skin was closed with staples.  Sterile dressing was applied.  At the end of the case all sponge, needle, and instrument counts were correct. The patient was then transferred to the stretcher, extubated, and taken to the post-anesthesia care unit in stable hemodynamic condition.

## 2021-03-11 NOTE — Anesthesia Procedure Notes (Signed)
Arterial Line Insertion Start/End7/29/2022 7:00 AM, 03/11/2021 7:10 AM Performed by: Josephine Igo, CRNA, CRNA  Patient location: Pre-op. Preanesthetic checklist: patient identified, IV checked, site marked, risks and benefits discussed, surgical consent, monitors and equipment checked, pre-op evaluation, timeout performed and anesthesia consent Lidocaine 1% used for infiltration Right, radial was placed Catheter size: 20 G Hand hygiene performed  and maximum sterile barriers used   Attempts: 2 Procedure performed without using ultrasound guided technique. Following insertion, dressing applied and Biopatch. Post procedure assessment: normal and unchanged  Patient tolerated the procedure well with no immediate complications.

## 2021-03-11 NOTE — Progress Notes (Signed)
Verbal order from Dr. Reatha Armour to [facility] PACU RN to use BP cuff instead of ART line for blood pressure management.

## 2021-03-11 NOTE — H&P (Signed)
Providing Compassionate, Quality Care - Together  NEUROSURGERY HISTORY & PHYSICAL   Debra Mcintyre is an 80 y.o. female.   Chief Complaint: Metastatic brain tumor  HPI: This is a right-handed 80 year old female with a history of metastatic breast cancer.  She presents today for an elective left frontotemporal craniotomy, resection of metastatic left temporal brain tumor.  She also has left-sided Bell's palsy which she has been treated for with steroids.  At this time she has no new complaints, continues to have left-sided Bell's palsy.  She has been off of her Plavix for 9 days. Her metastatic disease was found when she presented to ED with L bells palsy.  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 states that she experiences this occassionally 05/03/15   Headache    occassionally   Heart murmur    History of chemotherapy    Hx of radiation therapy 05/27/13-08/13/13   right chest wall/right supraclavicular/axillary region 5220 cGy 29 sessions, right mastectomy/chest wall boost cGy 5 sessions   Hypertension    Does not see a cardiologist   Hypothyroidism    Inguinal hernia    Neuropathy    Pure hypercholesterolemia    Rosacea    Seasonal allergies    Stroke ([place]) 2006   no deficits - TIA   TIA (transient ischemic attack)    Wears glasses     Past Surgical History:  Procedure Laterality Date   BREAST BIOPSY Right 10/23/2012   BREAST BIOPSY Right 10/08/2012   BREAST BIOPSY Left 08/13/2019   REACTIVE LYMPH NODE WITH FOLLICULAR HYPERPLASIA   COLONOSCOPY     COLONOSCOPY W/ POLYPECTOMY     ESOPHAGOGASTRODUODENOSCOPY     Left middle cerebral artery  angioplasty  2004   by [name] had 2 follow up occurances   MASTECTOMY Right    MASTECTOMY W/ SENTINEL NODE BIOPSY Right 04/23/2013   Procedure: RIGHT TOTAL MASTECTOMY WITH SENTINEL LYMPH NODE BIOPSY;  Surgeon: [name], MD;  Location: [facility];  Service: General;  Laterality: Right;   PORTACATH PLACEMENT N/A 11/05/2012   Procedure: INSERTION PORT-A-CATH;  Surgeon: [name], MD;  Location: [facility];  Service: General;  Laterality: N/A;   RADIOLOGY WITH ANESTHESIA N/A 05/04/2015   Procedure: MRI BRAIN WITH AND WITHOUT;  Surgeon: Medication Radiologist, MD;  Location: [facility];  Service: Radiology;  Laterality: N/A;    Family History  Problem Relation Age of Onset   Lung cancer Mother 38   Heart disease Father    Colon cancer Brother 45   Alcohol abuse Brother    Colon cancer Maternal Uncle        dx in his 59s   Colon cancer Maternal Aunt    Ovarian cancer Other 47   Lung cancer Maternal Aunt    Breast cancer Sister    Rectal cancer Neg Hx    Stomach cancer Neg Hx    Esophageal cancer Neg Hx    Social History:  reports that she quit smoking about 34 years ago. Her smoking use included cigarettes. She has a 20.00 pack-year smoking history. She has  never used smokeless tobacco. She reports that she does not drink alcohol and does not use drugs.  Allergies: No Known Allergies  Medications Prior to Admission  Medication Sig Dispense Refill   acetaminophen (TYLENOL) 500 MG tablet Take 1,000 mg by mouth every 6 (six) hours as needed (for headaches).     Calcium Carbonate (CALTRATE 600 PO) Take 1 tablet by mouth in the morning and at bedtime.     clopidogrel (PLAVIX) 75 MG tablet TAKE 1 TABLET BY MOUTH DAILY (Patient taking differently: Take 75 mg by mouth at bedtime.) 90 tablet 1   dexamethasone (DECADRON) 4 MG tablet Take 1 tablet (4 mg total) by mouth daily. (Patient taking differently: Take 2 mg by mouth at bedtime.) 30 tablet 0   gabapentin (NEURONTIN)  300 MG capsule Take 1 capsule (300 mg total) by mouth at bedtime. 90 capsule 4   Glycerin-Polysorbate 80 (REFRESH DRY EYE THERAPY OP) Place 1 drop into both eyes daily as needed (Dry eye).     levothyroxine (SYNTHROID) 137 MCG tablet TAKE 1 TABLET(137 MCG) BY MOUTH DAILY (Patient taking differently: Take 137 mcg by mouth daily before breakfast.) 90 tablet 1   lisinopril (ZESTRIL) 20 MG tablet TAKE 1 TABLET(20 MG) BY MOUTH DAILY (Patient taking differently: Take 10 mg by mouth at bedtime.) 90 tablet 1   metoprolol succinate (TOPROL-XL) 100 MG 24 hr tablet TAKE 1 TABLET BY MOUTH EVERY DAY WITH OR IMMEDIATELY FOLLOWING A MEAL (Patient taking differently: Take 50 mg by mouth at bedtime.) 90 tablet 1   Multiple Vitamins-Minerals (CENTRUM SILVER ULTRA WOMENS) TABS Take 1 tablet by mouth at bedtime.     psyllium (METAMUCIL) 58.6 % powder Take 1 packet by mouth daily as needed (Constipation).     rosuvastatin (CRESTOR) 20 MG tablet TAKE 1 TABLET BY MOUTH DAILY (Patient taking differently: Take 20 mg by mouth at bedtime.) 90 tablet 0   sertraline (ZOLOFT) 100 MG tablet TAKE 1 TABLET(100 MG) BY MOUTH DAILY (Patient taking differently: Take 100 mg by mouth in the morning.) 90 tablet 1    Results for orders placed or performed during the hospital encounter of 03/10/21 (from the past 48 hour(s))  SARS CORONAVIRUS 2 (TAT 6-24 HRS) Nasopharyngeal Nasopharyngeal Swab     Status: None   Collection Time: 03/10/21  9:00 AM   Specimen: Nasopharyngeal Swab  Result Value Ref Range   SARS Coronavirus 2 NEGATIVE NEGATIVE    Comment: (NOTE) SARS-CoV-2 target nucleic acids are NOT DETECTED.  The SARS-CoV-2 RNA is generally detectable in upper and lower respiratory specimens during the acute phase of infection. Negative results do not preclude SARS-CoV-2 infection, do not rule out co-infections with other pathogens, and should not be used as the sole basis for treatment or other patient management decisions. Negative  results must be combined with clinical observations, patient history, and epidemiological information. The expected result is Negative.  Fact Sheet for Patients: SugarRoll.be  Fact Sheet for Healthcare Providers: https://www.woods-mathews.com/  This test is not yet approved or cleared by the [place] FDA and  has been authorized for detection and/or diagnosis of SARS-CoV-2 by FDA under an Emergency Use Authorization (EUA). This EUA will remain  in effect (meaning this test can be used) for the duration of the COVID-19 declaration under Se ction 564(b)(1) of the Act, 21 U.S.C. section 360bbb-3(b)(1), unless the authorization is terminated or revoked sooner.  Performed at [facility], [place] [address], [place],  [address]    No results found.  ROS All positive negatives listed in HPI above  Blood pressure (!) 103/54, pulse 66, temperature 97.7 F (36.5 C), temperature source Oral, resp. rate 18, height 5\' 8"  (1.727 m), weight 72.3 kg, SpO2 96 %. Physical Exam  Aox3 PERRL L facial weakness MAE equally EOMI SILT  Assessment/Plan 80 yo F with:  Metastatic Brain tumor  -OR today for resection of left temporal tumor -all risks and benefits discussed and agreed upon, patient has been off plavix  -pain control   Thank you for allowing me to participate in this patient's care.  Please do not hesitate to call with questions or concerns.   Elwin Sleight, [facility] Cell: [phone]

## 2021-03-11 NOTE — Progress Notes (Signed)
Pt has 3 rings on left ring finger. Two yellow color bands and One yellow color band with a clear stone. Pt was educated on the rings needing to be removed for surgery due to equipment used during surgery and risk of the hand swelling during the procedure. Pt was informed that there was a possibility of having to cut the rings off of her finger.

## 2021-03-11 NOTE — Anesthesia Preprocedure Evaluation (Addendum)
Anesthesia Evaluation  Patient identified by MRN, date of birth, ID band Patient awake    Reviewed: Allergy & Precautions, NPO status , Patient's Chart, lab work & pertinent test results, reviewed documented beta blocker date and time   History of Anesthesia Complications Negative for: history of anesthetic complications  Airway Mallampati: II  TM Distance: >3 FB Neck ROM: Full    Dental  (+) Dental Advisory Given   Pulmonary asthma , former smoker,    Pulmonary exam normal        Cardiovascular hypertension, Pt. on medications and Pt. on home beta blockers Normal cardiovascular exam     Neuro/Psych  Headaches, PSYCHIATRIC DISORDERS Anxiety  Metastatic cancer to brain Cerebral aneurysm Facial droop  TIACVA, No Residual Symptoms    GI/Hepatic Neg liver ROS, GERD  Controlled,  Endo/Other  Hypothyroidism   Renal/GU negative Renal ROS     Musculoskeletal  (+) Arthritis , Fibromyalgia -  Abdominal   Peds  Hematology  On plavix    Anesthesia Other Findings Covid test negative Left eye irritation/dry eye   Reproductive/Obstetrics  Breast cancer                             Anesthesia Physical Anesthesia Plan  ASA: 3  Anesthesia Plan: General   Post-op Pain Management:    Induction: Intravenous  PONV Risk Score and Plan: 3 and Treatment may vary due to age or medical condition, Ondansetron and Dexamethasone  Airway Management Planned: Oral ETT  Additional Equipment: Arterial line  Intra-op Plan:   Post-operative Plan: Possible Post-op intubation/ventilation  Informed Consent: I have reviewed the patients History and Physical, chart, labs and discussed the procedure including the risks, benefits and alternatives for the proposed anesthesia with the patient or authorized representative who has indicated his/her understanding and acceptance.     Dental advisory given  Plan  Discussed with: CRNA and Anesthesiologist  Anesthesia Plan Comments:        Anesthesia Quick Evaluation

## 2021-03-11 NOTE — Progress Notes (Signed)
Dr. Christ Kick made aware of patient's BP readings 93/48 at 0556, 90/58 at 0605, and 103/54 at [phone].

## 2021-03-12 ENCOUNTER — Inpatient Hospital Stay (HOSPITAL_COMMUNITY): Payer: Medicare Other

## 2021-03-12 LAB — CBC
HCT: 33.7 % — ABNORMAL LOW (ref 36.0–46.0)
Hemoglobin: 11.3 g/dL — ABNORMAL LOW (ref 12.0–15.0)
MCH: 30.3 pg (ref 26.0–34.0)
MCHC: 33.5 g/dL (ref 30.0–36.0)
MCV: 90.3 fL (ref 80.0–100.0)
Platelets: 149 10*3/uL — ABNORMAL LOW (ref 150–400)
RBC: 3.73 MIL/uL — ABNORMAL LOW (ref 3.87–5.11)
RDW: 14.9 % (ref 11.5–15.5)
WBC: 10.8 10*3/uL — ABNORMAL HIGH (ref 4.0–10.5)
nRBC: 0 % (ref 0.0–0.2)

## 2021-03-12 LAB — BASIC METABOLIC PANEL
Anion gap: 9 (ref 5–15)
BUN: 12 mg/dL (ref 8–23)
CO2: 22 mmol/L (ref 22–32)
Calcium: 8.5 mg/dL — ABNORMAL LOW (ref 8.9–10.3)
Chloride: 105 mmol/L (ref 98–111)
Creatinine, Ser: 0.71 mg/dL (ref 0.44–1.00)
GFR, Estimated: >60 mL/min (ref >60)
Glucose, Bld: 103 mg/dL — ABNORMAL HIGH (ref 70–99)
Potassium: 3.7 mmol/L (ref 3.5–5.1)
Sodium: 136 mmol/L (ref 135–145)

## 2021-03-12 MED ORDER — GADOBUTROL 1 MMOL/ML IV SOLN
7.2000 mL | Freq: Once | INTRAVENOUS | Status: AC | PRN
  Administered 2021-03-12: 7.2 mL via INTRAVENOUS

## 2021-03-12 MED ORDER — PANTOPRAZOLE SODIUM 40 MG PO TBEC
40.0000 mg | DELAYED_RELEASE_TABLET | Freq: Every day | ORAL | Status: DC
  Administered 2021-03-12 – 2021-03-14 (×3): 40 mg via ORAL
  Filled 2021-03-12 (×3): qty 1

## 2021-03-12 MED ORDER — DEXAMETHASONE 4 MG PO TABS
4.0000 mg | ORAL_TABLET | Freq: Three times a day (TID) | ORAL | Status: DC
  Administered 2021-03-12 – 2021-03-13 (×5): 4 mg via ORAL
  Filled 2021-03-12 (×5): qty 1

## 2021-03-12 MED ORDER — SERTRALINE HCL 100 MG PO TABS
100.0000 mg | ORAL_TABLET | Freq: Every day | ORAL | Status: DC
  Administered 2021-03-12 – 2021-03-14 (×3): 100 mg via ORAL
  Filled 2021-03-12: qty 1
  Filled 2021-03-12: qty 2
  Filled 2021-03-12: qty 1

## 2021-03-12 MED ORDER — LEVETIRACETAM 500 MG PO TABS
500.0000 mg | ORAL_TABLET | Freq: Two times a day (BID) | ORAL | Status: DC
  Administered 2021-03-12 – 2021-03-15 (×6): 500 mg via ORAL
  Filled 2021-03-12 (×6): qty 1

## 2021-03-12 NOTE — Progress Notes (Signed)
   Providing Compassionate, Quality Care - Together  NEUROSURGERY PROGRESS NOTE   S: No issues overnight.  Complains of periincisional headaches  O: EXAM:  BP (!) 121/59 (BP Location: Left Arm)   Pulse 70   Temp 98.3 F (36.8 C) (Oral)   Resp (!) 23   Ht 5\' 8"  (1.727 m)   Wt 72.3 kg   SpO2 93%   BMI 24.24 kg/m   Awake, alert, oriented PERRL Speech fluent, appropriate CNs grossly intact  except L facial weakness from bells palsy 5/5 BUE/BLE  Incision c/d/i   ASSESSMENT:  80 y.o. female with   Metastatic brain lesions   S/p L crani for resection of temporal tumor on 03/11/2021   PLAN: - pt/ot -icu -pain control -sbp <150 -decadron -postop Mri pending -keppra, Decadron     Thank you for allowing me to participate in this patient's care.  Please do not hesitate to call with questions or concerns.   Elwin Sleight, [facility] Cell: [phone]

## 2021-03-12 NOTE — Evaluation (Signed)
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 PMHx: BR CA, TIA in 2006.   Clinical Impression   Pt PTA: Pt was independent. Pt currently, Pt with slower processing speeds requiring increased time to perform functional tasks. Pt set-upA to minguardA for OOB ADL tasks and mobility with supervisionA to minguardA. Focal deficits noted in cognition and processing speed. Pt would benefit from continued OT skillled services. OT following acutely.    Follow Up Recommendations  Outpatient OT (for cognition)    Equipment Recommendations  None recommended by OT    Recommendations for Other Services       Precautions / Restrictions Precautions Precautions: Fall;Other (comment) Precaution Comments: a-line Restrictions Weight Bearing Restrictions: No      Mobility Bed Mobility Overal bed mobility: Needs Assistance Bed Mobility: Supine to Sit     Supine to sit: Min assist     General bed mobility comments: MinA to support trunk to progress to upright position    Transfers Overall transfer level: Needs assistance Equipment used: None Transfers: Sit to/from Stand Sit to Stand: Min guard         General transfer comment: Good power up, min guard for safety    Balance Overall balance assessment: Mild deficits observed, not formally tested                                         ADL either performed or assessed with clinical judgement   ADL Overall ADL's : Needs assistance/impaired Eating/Feeding: Set up;Sitting   Grooming: Supervision/safety;Standing   Upper Body Bathing: Min guard;Standing   Lower Body Bathing: Min guard;Sit to/from stand   Upper Body Dressing : Min guard;Standing   Lower Body Dressing: Min guard;Sit to/from stand    Toilet Transfer: Min guard;Stand-pivot   Toileting- Water quality scientist and Hygiene: Supervision/safety;Sit to/from stand       Functional mobility during ADLs: Min guard;Supervision/safety;Cueing for safety General ADL Comments: Pt set-upA to minguardA for OOB ADL tasks. Pt with slower processing speeds requiring increased time to perform functional tasks.     Vision Baseline Vision/History: Wears glasses Wears Glasses: Reading only Patient Visual Report: No change from baseline Vision Assessment?: No apparent visual deficits     Perception     Praxis      Pertinent Vitals/Pain Pain Assessment: 0-10 Pain Score: 4  Pain Location: headache Pain Descriptors / Indicators: Discomfort;Headache Pain Intervention(s): Monitored during session;Premedicated before session     Hand Dominance Right   Extremity/Trunk Assessment Upper Extremity Assessment Upper Extremity Assessment: Generalized weakness;RUE deficits/detail;LUE deficits/detail RUE Deficits / Details: strength 4+/5 LUE Deficits / Details: strength 4+/5   Lower Extremity Assessment Lower Extremity Assessment: Defer to PT evaluation RLE Deficits / Details: Strength 5/5 LLE Deficits / Details: Strength 5/5   Cervical / Trunk Assessment Cervical / Trunk Assessment: Normal   Communication Communication Communication: Expressive difficulties;Other (comment) (slurred speech from Bell's palsy)   Cognition Arousal/Alertness: Awake/alert Behavior During Therapy: WFL for tasks assessed/performed Overall Cognitive Status: Impaired/Different from baseline Area of Impairment: Orientation;Problem solving                 Orientation Level: Time           Problem Solving: Slow  processing;Decreased initiation General Comments: Pt requiring increased time for tasks; Pt A/O x3 pt stating "I don't know" for actual date, but oriented to month and year.   General Comments  104/68 sitting EOB  98/69 sitting EOB   109/45 sitting in recliner  76 HR, 94% SpO2    Exercises     Shoulder Instructions      Home Living Family/patient expects to be discharged to:: Private residence Living Arrangements: Spouse/significant other;Children Available Help at Discharge: Family;Available 24 hours/day Type of Home: House Home Access: Stairs to enter [employer] of Steps: 1 Entrance Stairs-Rails: None Home Layout: One level;Able to live on main level with bedroom/bathroom     Bathroom Shower/Tub: Occupational psychologist: Standard     Home Equipment: Clinical cytogeneticist - 2 wheels          Prior Functioning/Environment Level of Independence: Independent        Comments: Cleaned home, walked dog, yardwork        OT Problem List: Decreased cognition;Decreased safety awareness;Pain      OT Treatment/Interventions: Self-care/ADL training;Therapeutic activities;Cognitive remediation/compensation;Therapeutic exercise;Energy conservation    OT Goals(Current goals can be found in the care plan section) Acute Rehab OT Goals Patient Stated Goal: return to independence OT Goal Formulation: With patient Time For Goal Achievement: 03/26/21 Potential to Achieve Goals: Good ADL Goals Additional ADL Goal #1: Pt will perform higher level cognitive tasks with 90% accuracy in minimall distracting environment to determine safety. Additional ADL Goal #2: Pt will perform OOB ADL tasks x15 mins with minimal cues for safety to increase independence with ADL.  OT Frequency: Min 2X/week   Barriers to D/C:            Co-evaluation   Reason for Co-Treatment: Complexity of the patient's impairments (multi-system involvement) PT goals addressed during session: Mobility/safety with mobility        AM-PAC OT "6 Clicks" Daily Activity     Outcome Measure Help from another person eating meals?: None Help from another person taking care of personal grooming?: A Little Help from another  person toileting, which includes using toliet, bedpan, or urinal?: A Little Help from another person bathing (including washing, rinsing, drying)?: A Little Help from another person to put on and taking off regular upper body clothing?: None Help from another person to put on and taking off regular lower body clothing?: A Little 6 Click Score: 20   End of Session Equipment Utilized During Treatment: Gait belt Nurse Communication: Mobility status  Activity Tolerance: Patient tolerated treatment well Patient left: in chair;with call bell/phone within reach;with bed alarm set  OT Visit Diagnosis: Unsteadiness on feet (R26.81);Other symptoms and signs involving cognitive function                Time: 2248-2500 OT Time Calculation (min): 29 min Charges:  OT General Charges $OT Visit: 1 Visit OT Evaluation $OT Eval Moderate Complexity: 1 Mod  Jefferey Pica, OTR/L Acute Rehabilitation Services Pager: [phone] Office: [phone]   Gerik Coberly C 03/12/2021, 4:12 PM

## 2021-03-12 NOTE — Evaluation (Signed)
Physical Therapy Evaluation Patient Details Name: Debra Mcintyre MRN: [id number] DOB: 11/21/1940 Today's Date: 03/12/2021   History of Present Illness  80 yo female s/p Metastatic brain lesions s/p L crani for resection of temporal tumor on 03/11/2021. PMHx: BR CA, TIA in 2006.  Clinical Impression  Prior to admission, pt lives with her family and is independent. Pt reporting headache (despite premedication), however, pt very pleasant and agreeable to participate. Pt not oriented to time and noted slower processing speeds. Ambulating x 30 feet with no assistive device without physical difficulty. No focal strength deficits upon assessment. Suspect pt will progress well with appropriate pain control and management. Will continue to follow acutely to promote mobility as tolerated. No PT follow up anticipated.     Follow Up Recommendations No PT follow up;Supervision/Assistance - 24 hour    Equipment Recommendations  None recommended by PT    Recommendations for Other Services       Precautions / Restrictions Precautions Precautions: Fall;Other (comment) Precaution Comments: a-line Restrictions Weight Bearing Restrictions: No      Mobility  Bed Mobility Overal bed mobility: Needs Assistance Bed Mobility: Supine to Sit     Supine to sit: Min assist     General bed mobility comments: MinA to support trunk to progress to upright position    Transfers Overall transfer level: Needs assistance Equipment used: None Transfers: Sit to/from Stand Sit to Stand: Min guard         General transfer comment: Good power up, min guard for safety  Ambulation/Gait Ambulation/Gait assistance: Min guard;+2 safety/equipment Gait Distance (Feet): 30 Feet Assistive device: None Gait Pattern/deviations: Step-through pattern;Decreased stride length     General Gait Details: Slow and cautious gait (likely due to lines), no gross imbalance noted  Stairs            Wheelchair  Mobility    Modified Rankin (Stroke Patients Only)       Balance Overall balance assessment: Mild deficits observed, not formally tested                                           Pertinent Vitals/Pain Pain Assessment: 0-10 Pain Score: 4  Pain Location: headache Pain Descriptors / Indicators: Discomfort;Headache Pain Intervention(s): Monitored during session    Home Living Family/patient expects to be discharged to:: Private residence Living Arrangements: Spouse/significant other;Children Available Help at Discharge: Family;Available 24 hours/day Type of Home: House Home Access: Stairs to enter Entrance Stairs-Rails: None Entrance Stairs-Number of Steps: 1 Home Layout: One level;Able to live on main level with bedroom/bathroom Home Equipment: Shower seat;Walker - 2 wheels      Prior Function Level of Independence: Independent         Comments: Cleaned home, walked dog, yardwork     Hand Dominance   Dominant Hand: Right    Extremity/Trunk Assessment   Upper Extremity Assessment Upper Extremity Assessment: Defer to OT evaluation    Lower Extremity Assessment Lower Extremity Assessment: RLE deficits/detail;LLE deficits/detail RLE Deficits / Details: Strength 5/5 LLE Deficits / Details: Strength 5/5    Cervical / Trunk Assessment Cervical / Trunk Assessment: Normal  Communication   Communication: Expressive difficulties;Other (comment) (slurred speech from Bell's palsy)  Cognition Arousal/Alertness: Awake/alert Behavior During Therapy: WFL for tasks assessed/performed Overall Cognitive Status: Impaired/Different from baseline Area of Impairment: Orientation;Problem solving  Orientation Level: Time           Problem Solving: Slow processing;Decreased initiation General Comments: Pt requiring increased time for tasks; Pt A/O x2, not oriented to month      General Comments      Exercises      Assessment/Plan    PT Assessment Patient needs continued PT services  PT Problem List Pain;Decreased cognition;Decreased mobility;Decreased balance;Decreased activity tolerance       PT Treatment Interventions Gait training;Stair training;Functional mobility training;Therapeutic exercise;Therapeutic activities;Balance training;Patient/family education    PT Goals (Current goals can be found in the Care Plan section)  Acute Rehab PT Goals Patient Stated Goal: return to independence PT Goal Formulation: With patient Time For Goal Achievement: 03/26/21 Potential to Achieve Goals: Good    Frequency Min 4X/week   Barriers to discharge        Co-evaluation PT/OT/SLP Co-Evaluation/Treatment: Yes Reason for Co-Treatment: To address functional/ADL transfers PT goals addressed during session: Mobility/safety with mobility         AM-PAC PT "6 Clicks" Mobility  Outcome Measure Help needed turning from your back to your side while in a flat bed without using bedrails?: A Little Help needed moving from lying on your back to sitting on the side of a flat bed without using bedrails?: A Little Help needed moving to and from a bed to a chair (including a wheelchair)?: A Little Help needed standing up from a chair using your arms (e.g., wheelchair or bedside chair)?: A Little Help needed to walk in hospital room?: A Little Help needed climbing 3-5 steps with a railing? : A Little 6 Click Score: 18    End of Session Equipment Utilized During Treatment: Gait belt Activity Tolerance: Patient tolerated treatment well Patient left: in chair;with call bell/phone within reach;with chair alarm set Nurse Communication: Mobility status PT Visit Diagnosis: Pain;Difficulty in walking, not elsewhere classified (R26.2) Pain - part of body:  (head)    Time: 7116-5790 PT Time Calculation (min) (ACUTE ONLY): 29 min   Charges:   PT Evaluation $PT Eval Moderate Complexity: Lane, PT, DPT [place] Pager [phone] Office [phone]   Deno Etienne 03/12/2021, 1:21 PM

## 2021-03-13 MED ORDER — DEXAMETHASONE 4 MG PO TABS
4.0000 mg | ORAL_TABLET | Freq: Three times a day (TID) | ORAL | Status: DC
  Administered 2021-03-13 – 2021-03-14 (×2): 4 mg via ORAL
  Filled 2021-03-13 (×2): qty 1

## 2021-03-13 MED ORDER — ARTIFICIAL TEARS OPHTHALMIC OINT
TOPICAL_OINTMENT | Freq: Four times a day (QID) | OPHTHALMIC | Status: DC
  Filled 2021-03-13 (×2): qty 3.5

## 2021-03-13 NOTE — Progress Notes (Signed)
Subjective: Patient reports no significant headache  Objective: Vital signs in last 24 hours: Temp:  [97.5 F (36.4 C)-98.7 F (37.1 C)] 98.4 F (36.9 C) (07/31 0800) Pulse Rate:  [60-96] 62 (07/31 0800) Resp:  [14-24] 16 (07/31 0800) BP: (94-141)/(43-74) 137/69 (07/31 0800) SpO2:  [85 %-96 %] 95 % (07/31 0800)  Intake/Output from previous day: 07/30 0701 - 07/31 0700 In: 868.5 [P.O.:240; I.V.:628.5] Out: 2550 [Urine:2550] Intake/Output this shift: No intake/output data recorded.  NAD L facial droop Deconditioned  Lab Results: Recent Labs    03/11/21 1256 03/12/21 0500  WBC 7.7 10.8*  HGB 12.4 11.3*  HCT 38.4 33.7*  PLT 149* 149*   BMET Recent Labs    03/11/21 1256 03/12/21 0500  NA  --  136  K  --  3.7  CL  --  105  CO2  --  22  GLUCOSE  --  103*  BUN  --  12  CREATININE 0.69 0.71  CALCIUM  --  8.5*    Studies/Results: MR BRAIN W WO CONTRAST  Addendum Date: 03/12/2021   ADDENDUM REPORT: 03/12/2021 22:48 ADDENDUM: In addition to the initially described findings, note made of trace susceptibility artifact layering within the occipital horns of both lateral ventricles, consistent with trace intraventricular hemorrhage, likely related to redistribution. No hydrocephalus. Electronically Signed   By: Jeannine Boga M.D.   On: 03/12/2021 22:48   Result Date: 03/12/2021 CLINICAL DATA:  Follow-up examination status post craniotomy for tumor resection. EXAM: MRI HEAD WITHOUT AND WITH CONTRAST TECHNIQUE: Multiplanar, multiecho pulse sequences of the brain and surrounding structures were obtained without and with intravenous contrast. CONTRAST:  7.95mL GADAVIST GADOBUTROL 1 MMOL/ML IV SOLN COMPARISON:  Prior brain MRI from 02/24/2021. FINDINGS: Brain: Postoperative changes from interval left pterional craniotomy are seen. Scattered postoperative pneumocephalus overlies the left greater than right anterior frontal convexities. Previously seen left temporal lobe  metastasis has been resected, with scattered postoperative blood products within the resection cavity. Small amount of residual enhancement along the posterior/inferior margin of the resection cavity, likely reflecting a small amount of residual tumor (series 18, images 18-13). Persistent dural infiltration with posterior extension to involve the left geniculate ganglion, with additional extension along the labyrinthine segment proximally and facial nerve distally, similar to preoperative exam (series 18, images 13, 11). Mild localized edema within the adjacent left temporal lobe without significant regional mass effect. 7 mm focus of restricted diffusion involving the left frontal cortex consistent with a tiny acute ischemic nonhemorrhagic infarct (series 5, image 84). Additional vague diffusion abnormality along the posterior falx favored to be related to susceptibility artifact. No other evidence for acute or subacute ischemia. Additional metastatic lesions involving the right occipital lobe and right cerebellum again noted, unchanged measuring 1.1 cm in 2.4 cm respectively. Associated vasogenic edema is not significantly changed. No other mass lesion or abnormal enhancement. Underlying chronic microvascular ischemic disease involving the supratentorial white matter and pons again noted. Vascular: Major intracranial vascular flow voids are maintained. Skull and upper cervical spine: Craniocervical junction within normal limits. Bone marrow signal intensity normal. Post craniotomy changes noted at the left frontotemporal calvarium and scalp. Sinuses/Orbits: Prior bilateral ocular lens replacement. Scattered mucosal thickening within the ethmoidal air cells. Mastoid air cells remain clear. Other: None. IMPRESSION: 1. Postoperative changes from interval left pterional craniotomy for tumor resection. Small amount of residual enhancement along the posterior/inferior margin of the resection cavity likely reflects a  small amount of residual tumor, with similar dural infiltration and extension  to involve the geniculate ganglion and seventh cranial nerve as above. 2. 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 3. Additional metastatic lesions involving the right occipital lobe and right cerebellum, unchanged. 4. Underlying chronic microvascular ischemic disease. Electronically Signed: By: Jeannine Boga M.D. On: 03/12/2021 22:27    Assessment/Plan: 80 yo F with breast cancer s/p craniotomy for left temporal lobe metastasis - I discussed possible discharge with family today.  They strongly preferred she stay one additional day.  Will transfer to floor. - Continue dex - hep subQ  Vallarie Mare 03/13/2021, 11:17 AM

## 2021-03-14 ENCOUNTER — Inpatient Hospital Stay: Payer: Medicare Other

## 2021-03-14 ENCOUNTER — Encounter (HOSPITAL_COMMUNITY): Payer: Self-pay | Admitting: Neurological Surgery

## 2021-03-14 DIAGNOSIS — C7931 Secondary malignant neoplasm of brain: Secondary | ICD-10-CM | POA: Insufficient documentation

## 2021-03-14 DIAGNOSIS — C3411 Malignant neoplasm of upper lobe, right bronchus or lung: Secondary | ICD-10-CM | POA: Insufficient documentation

## 2021-03-14 DIAGNOSIS — Z79899 Other long term (current) drug therapy: Secondary | ICD-10-CM | POA: Insufficient documentation

## 2021-03-14 DIAGNOSIS — Z9221 Personal history of antineoplastic chemotherapy: Secondary | ICD-10-CM | POA: Insufficient documentation

## 2021-03-14 DIAGNOSIS — Z87891 Personal history of nicotine dependence: Secondary | ICD-10-CM | POA: Insufficient documentation

## 2021-03-14 DIAGNOSIS — Z923 Personal history of irradiation: Secondary | ICD-10-CM | POA: Insufficient documentation

## 2021-03-14 MED ORDER — HYDROCODONE-ACETAMINOPHEN 5-325 MG PO TABS: 1.0000 | ORAL_TABLET | ORAL | 0 refills | Status: DC | PRN

## 2021-03-14 MED ORDER — LEVETIRACETAM 500 MG PO TABS: 500.0000 mg | ORAL_TABLET | Freq: Two times a day (BID) | ORAL | 1 refills | Status: AC

## 2021-03-14 MED ORDER — CLOPIDOGREL BISULFATE 75 MG PO TABS: 75.0000 mg | ORAL_TABLET | Freq: Every day | ORAL | 1 refills | Status: AC

## 2021-03-14 MED ORDER — DEXAMETHASONE 4 MG PO TABS: 4.0000 mg | ORAL_TABLET | Freq: Two times a day (BID) | ORAL | 1 refills | Status: AC

## 2021-03-14 MED ORDER — DEXAMETHASONE 4 MG PO TABS
4.0000 mg | ORAL_TABLET | Freq: Two times a day (BID) | ORAL | Status: DC
  Administered 2021-03-14 – 2021-03-15 (×2): 4 mg via ORAL
  Filled 2021-03-14 (×2): qty 1

## 2021-03-14 NOTE — Progress Notes (Signed)
   Providing Compassionate, Quality Care - Together  NEUROSURGERY PROGRESS NOTE   S: No issues overnight. Pain controlled  O: EXAM:  BP 139/70   Pulse (!) 59   Temp 97.8 F (36.6 C) (Oral)   Resp 16   Ht 5\' 8"  (1.727 m)   Wt 72.3 kg   SpO2 100%   BMI 24.24 kg/m   Awake, alert, oriented  x3 Speech fluent, appropriate  CNs grossly , except left facial nerve [facility] 5/6 5/5 BUE/BLE   Incision clean  dry and intact  PERRLA  EOMI  ASSESSMENT:  80 y.o. female with    Metastatic brain lesions   S/p L crani for resection of temporal tumor on 03/11/2021   PLAN: - pt/ot - family would like social worker come by to help plan home health care -[place], [place]        Thank you for allowing me to participate in this patient's care.  Please do not hesitate to call with questions or concerns.   Elwin Sleight, [facility] Cell: [phone]

## 2021-03-14 NOTE — TOC Transition Note (Addendum)
Transition of Care [facility]) - CM/SW Discharge Note   Patient Details  Name: Debra Mcintyre MRN: [id number] Date of Birth: April 05, 1941  [facility] CM/SW Contact:  Pollie Friar, RN Phone Number: 03/14/2021, 12:06 PM   Clinical Narrative:    Patient is discharging home with [facility] services through [place]. Cory with [facility] accepted the referral. Information on the AVS. No DME needs.  Pts sons will be staying with her for about 9-10 days. Pts friend, Webb Silversmith is going to talk with family about arranging caregivers for after the 10 days.  Pt has transportation home.  1332: Family needs another day to figure out extra care at home. CM has updated MD and he is in agreement with her staying until tomorrow. Pt and bedside RN updated.  Final next level of care: Home w Home Health Services Barriers to Discharge: No Barriers Identified   Patient Goals and CMS Choice   CMS Medicare.gov Compare Post Acute Care list provided to:: Patient Choice offered to / list presented to : Patient (Friend)  Discharge Placement                       Discharge Plan and Services                          HH Arranged: RN, PT, OT, Social Work [facility] Agency: [place] Date [facility] Agency Contacted: 03/14/21   Representative spoke with at [place]: [place] ([place]) Interventions     Readmission Risk Interventions No flowsheet data found.

## 2021-03-14 NOTE — Progress Notes (Signed)
Occupational Therapy Treatment Patient Details Name: Debra Mcintyre MRN: [id number] DOB: [birth year] Today's Date: 03/14/2021    History of present illness 80 yo female s/p Metastatic brain lesions s/p L crani for resection of temporal tumor on 03/11/2021. PMHx: BR CA, TIA in 2006.   OT comments  Pt progressing. Pt's session focused on cognition (problem solving a novel task, memory and attention. Pt completed pillbox test with no errors and 100% accuracy. Pt with L visual deficits with blurriness, not double vision reported. Use of reading glasses to read labels. Pt would benefit from continued OT skilled services. OT following acutely.     Follow Up Recommendations  Outpatient OT (for vision and cognition)    Equipment Recommendations  None recommended by OT    Recommendations for Other Services      Precautions / Restrictions Precautions Precautions: Fall Restrictions Weight Bearing Restrictions: No       Mobility Bed Mobility Overal bed mobility: Needs Assistance Bed Mobility: Supine to Sit;Sit to Supine     Supine to sit: Min guard Sit to supine: Min guard        Transfers                      Balance                                           ADL either performed or assessed with clinical judgement   ADL Overall ADL's : Needs assistance/impaired                                     Functional mobility during ADLs: Min guard;Supervision/safety;Cueing for safety General ADL Comments: Pt's session focused on cognition (problem solving a novel task, memory and attention. Pt completed pillbox test with no errors and 100% accuracy.     Vision   Vision Assessment?: Yes Eye Alignment: Within Functional Limits Ocular Range of Motion: Within Functional Limits Additional Comments: blurriness in L eye s/p surgery, but pr reports that it is getting better each day.   Perception     Praxis      Cognition  Arousal/Alertness: Awake/alert Behavior During Therapy: WFL for tasks assessed/performed Overall Cognitive Status: Impaired/Different from baseline Area of Impairment: Problem solving                             Problem Solving: Slow processing;Decreased initiation General Comments: Pt completing Pillbox Test with 0 errors and completed in 5 mins. Pt did not portray delayed processing, but did second guess self at times, and went over her work before stating "I'm done."        Exercises     Shoulder Instructions       General Comments VSS on RA    Pertinent Vitals/ Pain       Pain Assessment: Faces Faces Pain Scale: Hurts a little bit Pain Location: headache Pain Descriptors / Indicators: Discomfort;Headache Pain Intervention(s): Monitored during session  Home Living                                          Prior Functioning/Environment  Frequency  Min 2X/week        Progress Toward Goals  OT Goals(current goals can now be found in the care plan section)  Progress towards OT goals: Progressing toward goals  Acute Rehab OT Goals Patient Stated Goal: return to independence OT Goal Formulation: With patient Time For Goal Achievement: 03/26/21 Potential to Achieve Goals: Good  Plan Discharge plan remains appropriate    Co-evaluation                 AM-PAC OT "6 Clicks" Daily Activity     Outcome Measure   Help from another person eating meals?: None Help from another person taking care of personal grooming?: None Help from another person toileting, which includes using toliet, bedpan, or urinal?: A Little Help from another person bathing (including washing, rinsing, drying)?: A Little Help from another person to put on and taking off regular upper body clothing?: None Help from another person to put on and taking off regular lower body clothing?: A Little 6 Click Score: 21    End of Session    OT Visit  Diagnosis: Unsteadiness on feet (R26.81);Other symptoms and signs involving cognitive function   Activity Tolerance Patient tolerated treatment well   Patient Left in bed;with call bell/phone within reach;with bed alarm set   Nurse Communication Mobility status        Time: 1410-1430 OT Time Calculation (min): 20 min  Charges: OT General Charges $OT Visit: 1 Visit  Jefferey Pica, OTR/L Acute Rehabilitation Services Pager: [phone] Office: [place] 03/14/2021, 8:21 PM

## 2021-03-14 NOTE — Progress Notes (Signed)
Subjective: Patient reports that she is doing well overall. She denies any headaches. No acute events overnight. She stated that she is ready to go home.   Objective: Vital signs in last 24 hours: Temp:  [97.4 F (36.3 C)-99.2 F (37.3 C)] 98.4 F (36.9 C) (08/01 0042) Pulse Rate:  [62-67] 65 (08/01 0042) Resp:  [16-17] 16 (08/01 0042) BP: (133-143)/(59-70) 133/68 (08/01 0042) SpO2:  [95 %-98 %] 95 % (08/01 0042)  Intake/Output from previous day: No intake/output data recorded. Intake/Output this shift: No intake/output data recorded.  Physical Exam: Patient is awake, A/O X 3, conversant, and in good spirits. VSS. Speech is fluent and appropriate. Doing well. MAEW with good strength that is symmetric bilaterally. 5/5 BUE/BLE. Sensation to light touch is intact. PERLA, EOMI. CNs grossly intact. Incision is well approximated with no drainage, erythema, or edema.   Lab Results: Recent Labs    03/11/21 1256 03/12/21 0500  WBC 7.7 10.8*  HGB 12.4 11.3*  HCT 38.4 33.7*  PLT 149* 149*   BMET Recent Labs    03/11/21 1256 03/12/21 0500  NA  --  136  K  --  3.7  CL  --  105  CO2  --  22  GLUCOSE  --  103*  BUN  --  12  CREATININE 0.69 0.71  CALCIUM  --  8.5*    Studies/Results: MR BRAIN W WO CONTRAST  Addendum Date: 03/12/2021   ADDENDUM REPORT: 03/12/2021 22:48 ADDENDUM: In addition to the initially described findings, note made of trace susceptibility artifact layering within the occipital horns of both lateral ventricles, consistent with trace intraventricular hemorrhage, likely related to redistribution. No hydrocephalus. Electronically Signed   By: Jeannine Boga M.D.   On: 03/12/2021 22:48   Result Date: 03/12/2021 CLINICAL DATA:  Follow-up examination status post craniotomy for tumor resection. EXAM: MRI HEAD WITHOUT AND WITH CONTRAST TECHNIQUE: Multiplanar, multiecho pulse sequences of the brain and surrounding structures were obtained without and with  intravenous contrast. CONTRAST:  7.103mL GADAVIST GADOBUTROL 1 MMOL/ML IV SOLN COMPARISON:  Prior brain MRI from 02/24/2021. FINDINGS: Brain: Postoperative changes from interval left pterional craniotomy are seen. Scattered postoperative pneumocephalus overlies the left greater than right anterior frontal convexities. 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 Small amount of residual enhancement along the posterior/inferior margin of the resection cavity, likely reflecting a small amount of residual tumor (series 18, images 18-13). Persistent dural infiltration with posterior extension to involve the left geniculate ganglion, with additional extension along the labyrinthine segment proximally and facial nerve distally, similar to preoperative exam (series 18, images 13, 11). Mild localized edema within the adjacent left temporal lobe without significant regional mass effect. 7 mm focus of restricted diffusion involving the left frontal cortex consistent with a tiny acute ischemic nonhemorrhagic infarct (series 5, image 84). Additional vague diffusion abnormality along the posterior falx favored to be related to susceptibility artifact. No other evidence for acute or subacute ischemia. Additional metastatic lesions involving the right occipital lobe and right cerebellum again noted, unchanged measuring 1.1 cm in 2.4 cm respectively. Associated vasogenic edema is not significantly changed. No other mass lesion or abnormal enhancement. Underlying chronic microvascular ischemic disease involving the supratentorial white matter and pons again noted. Vascular: Major intracranial vascular flow voids are maintained. Skull and upper cervical spine: Craniocervical junction within normal limits. Bone marrow signal intensity normal. Post craniotomy changes noted at the left frontotemporal calvarium and scalp.  Sinuses/Orbits: Prior  bilateral ocular lens replacement. Scattered mucosal thickening within the ethmoidal air cells. Mastoid air cells remain clear. Other: None. IMPRESSION: 1. Postoperative changes from interval left pterional craniotomy for tumor resection. Small amount of residual enhancement along the posterior/inferior margin of the resection cavity likely reflects a small amount of residual tumor, with similar dural infiltration and extension to involve the geniculate ganglion and seventh cranial nerve as above. 2. 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 3. Additional metastatic lesions involving the right occipital lobe and right cerebellum, unchanged. 4. Underlying chronic microvascular ischemic disease. Electronically Signed: By: Jeannine Boga M.D. On: 03/12/2021 22:27    Assessment/Plan: Patient is post-op day 3 s/p left craniotomy for resection of temporal tumor on 03/11/2021 due to metastatic brain lesions. She is recovering well and currently has no complaints. Continue working on pain control, mobility and ambulating patient.   - PT/OT -pain control -sbp <150 -decadron -keppra   LOS: 3 days     Marvis Moeller, DNP, NP-C 03/14/2021, 7:03 AM

## 2021-03-14 NOTE — Plan of Care (Signed)
Patient is alert oriented x 4. Ambulatory with stand by assist. Pt has left sided facial droop, no difficulty swallowing. Staples are intact to left side of face. Pt c/o pain to surgical area, prn morphine given for pain  with effective results.   Problem: Education: Goal: Knowledge of General Education information will improve Description: Including pain rating scale, medication(s)/side effects and non-pharmacologic comfort measures Outcome: Progressing   Problem: Health Behavior/Discharge Planning: Goal: Ability to manage health-related needs will improve Outcome: Progressing   Problem: Clinical Measurements: Goal: Ability to maintain clinical measurements within normal limits will improve Outcome: Progressing Goal: Will remain free from infection Outcome: Progressing Goal: Diagnostic test results will improve Outcome: Progressing Goal: Respiratory complications will improve Outcome: Progressing Goal: Cardiovascular complication will be avoided Outcome: Progressing   Problem: Activity: Goal: Risk for activity intolerance will decrease Outcome: Progressing   Problem: Nutrition: Goal: Adequate nutrition will be maintained Outcome: Progressing   Problem: Coping: Goal: Level of anxiety will decrease Outcome: Progressing   Problem: Elimination: Goal: Will not experience complications related to bowel motility Outcome: Progressing Goal: Will not experience complications related to urinary retention Outcome: Progressing   Problem: Pain Managment: Goal: General experience of comfort will improve Outcome: Progressing   Problem: Safety: Goal: Ability to remain free from injury will improve Outcome: Progressing   Problem: Skin Integrity: Goal: Risk for impaired skin integrity will decrease Outcome: Progressing

## 2021-03-15 NOTE — Progress Notes (Signed)
Physical Therapy Treatment Patient Details Name: Debra Mcintyre MRN: [id number] DOB: [birth year] Today's Date: 03/15/2021    History of Present Illness 80 yo female s/p Metastatic brain lesions s/p L crani for resection of temporal tumor on 03/11/2021. PMHx: BR CA, TIA in 2006.    PT Comments    Pt progressing towards physical therapy goals. With the addition of higher level balance activity, pt demonstrating frequent LOB requiring assist to recover. Recommend pt utilize segmental turning fixating gaze on a point prior to turning body due to LOB with head turns. Pt is at a high risk for falls and recommend follow-up outpatient PT for higher level balance training to improve safety. Will continue to follow.    Follow Up Recommendations  Outpatient PT;Supervision/Assistance - 24 hour     Equipment Recommendations  None recommended by PT    Recommendations for Other Services       Precautions / Restrictions Precautions Precautions: Fall Restrictions Weight Bearing Restrictions: No    Mobility  Bed Mobility Overal bed mobility: Needs Assistance Bed Mobility: Supine to Sit;Sit to Supine     Supine to sit: Min guard Sit to supine: Supervision   General bed mobility comments: Close guard for safety as pt transitioned to EOB. Pt with heavy posterior lean initially and required increased time to correct.    Transfers Overall transfer level: Needs assistance Equipment used: None Transfers: Sit to/from Stand Sit to Stand: Min guard         General transfer comment: Close guard for safety as pt powered up to full standing position. No assist required and no LOB noted.  Ambulation/Gait Ambulation/Gait assistance: Min assist Gait Distance (Feet): 200 Feet Assistive device: None Gait Pattern/deviations: Step-through pattern;Decreased stride length Gait velocity: Decreased Gait velocity interpretation: 1.31 - 2.62 ft/sec, indicative of limited community ambulator General  Gait Details: Slow and guarded initially. With addition of head turns, pt consistently losing balance to the L and required assist to recover.   Stairs Stairs: Yes Stairs assistance: Min guard Stair Management: Two rails;Step to pattern;Forwards Number of Stairs: 5 General stair comments: VC's for improved posture, and general safety with negotiating stairs.   Wheelchair Mobility    Modified Rankin (Stroke Patients Only)       Balance Overall balance assessment: Mild deficits observed, not formally tested                                          Cognition Arousal/Alertness: Awake/alert Behavior During Therapy: WFL for tasks assessed/performed Overall Cognitive Status: Impaired/Different from baseline Area of Impairment: Problem solving;Attention                   Current Attention Level: Selective         Problem Solving: Slow processing;Decreased initiation;Requires verbal cues General Comments: Pt with difficulty attaining attention to task, frequently asking therapist personal questions and getting off topic.      Exercises      General Comments        Pertinent Vitals/Pain Pain Assessment: No/denies pain    Home Living                      Prior Function            PT Goals (current goals can now be found in the care plan section) Acute Rehab PT Goals Patient Stated Goal:  return to independence PT Goal Formulation: With patient Time For Goal Achievement: 03/26/21 Potential to Achieve Goals: Good Progress towards PT goals: Progressing toward goals    Frequency    Min 4X/week      PT Plan Discharge plan needs to be updated    Co-evaluation              AM-PAC PT "6 Clicks" Mobility   Outcome Measure  Help needed turning from your back to your side while in a flat bed without using bedrails?: A Little Help needed moving from lying on your back to sitting on the side of a flat bed without using  bedrails?: A Little Help needed moving to and from a bed to a chair (including a wheelchair)?: A Little Help needed standing up from a chair using your arms (e.g., wheelchair or bedside chair)?: A Little Help needed to walk in hospital room?: A Little Help needed climbing 3-5 steps with a railing? : A Little 6 Click Score: 18    End of Session Equipment Utilized During Treatment: Gait belt Activity Tolerance: Patient tolerated treatment well Patient left: in chair;with call bell/phone within reach;with chair alarm set Nurse Communication: Mobility status PT Visit Diagnosis: Pain;Difficulty in walking, not elsewhere classified (R26.2) Pain - part of body:  (head)     Time: 4920-1007 PT Time Calculation (min) (ACUTE ONLY): 15 min  Charges:  $Gait Training: 8-22 mins                     Rolinda Roan, PT, DPT Acute Rehabilitation Services Pager: [phone] Office: [phone]    [name] 03/15/2021, 11:00 AM

## 2021-03-15 NOTE — Care Management Important Message (Signed)
Important Message  Patient Details  Name: CIERRAH DACE MRN: [id number] Date of Birth: [birth year]   Medicare Important Message Given:  Yes     Orbie Pyo 03/15/2021, 9:04 AM

## 2021-03-15 NOTE — Plan of Care (Signed)
  Problem: Coping: Goal: Level of anxiety will decrease 03/15/2021 1148 by Dorris Carnes, RN Outcome: Adequate for Discharge 03/15/2021 1137 by Dorris Carnes, RN Outcome: Progressing   Problem: Pain Managment: Goal: General experience of comfort will improve 03/15/2021 1148 by Dorris Carnes, RN Outcome: Adequate for Discharge 03/15/2021 1137 by Dorris Carnes, RN Outcome: Progressing   Problem: Safety: Goal: Ability to remain free from injury will improve 03/15/2021 1148 by Dorris Carnes, RN Outcome: Adequate for Discharge 03/15/2021 1137 by Dorris Carnes, RN Outcome: Progressing   Problem: Skin Integrity: Goal: Risk for impaired skin integrity will decrease 03/15/2021 1148 by Dorris Carnes, RN Outcome: Adequate for Discharge 03/15/2021 1137 by Dorris Carnes, RN Outcome: Progressing

## 2021-03-15 NOTE — Progress Notes (Signed)
Pt refused IV fluids, pt has discharge orders for today 03/15/2021.

## 2021-03-15 NOTE — Progress Notes (Signed)
Subjective: Patient reports that she is doing well. She has no complaints and is ready to be discharged home. No acute events overnight.  Objective: Vital signs in last 24 hours: Temp:  [97.7 F (36.5 C)-98 F (36.7 C)] 97.7 F (36.5 C) (08/02 0713) Pulse Rate:  [59-76] 59 (08/02 0713) Resp:  [14-22] 16 (08/02 0713) BP: (135-163)/(66-81) 163/75 (08/02 0713) SpO2:  [95 %-100 %] 98 % (08/02 0713)  Intake/Output from previous day: 08/01 0701 - 08/02 0700 In: 1329.8 [P.O.:780; I.V.:549.8] Out: -  Intake/Output this shift: No intake/output data recorded.  Physical Exam: Patient is awake, A/O X 3, conversant, and in good spirits. VSS. Speech is fluent and appropriate. She MAEW with good strength that is symmetric bilaterally. 5/5 BUE/BLE. Sensation intact. PERLA, EOMI. CNs grossly intact. Incision is well approximated with no drainage, erythema, or edema.  Lab Results: No results for input(s): WBC, HGB, HCT, PLT in the last 72 hours. BMET No results for input(s): NA, K, CL, CO2, GLUCOSE, BUN, CREATININE, CALCIUM in the last 72 hours.  Studies/Results: No results found.  Assessment/Plan: Patient is post-op day 3 s/p left craniotomy for resection of temporal tumor on 03/11/2021 due to metastatic brain lesions. She is recovering well and currently has no complaints. Continue working on pain control, mobility and ambulating patient.    - PT/OT -pain control -sbp <150 -decadron -keppra -CSW consult to help plan home health care -Plan for discharge today -Will need outpatient follow up in 2 weeks   LOS: 4 days     Marvis Moeller, DNP, NP-C 03/15/2021, 8:15 AM

## 2021-03-15 NOTE — Discharge Summary (Addendum)
Physician Discharge Summary  Patient ID: Debra Mcintyre MRN: [id number] DOB/AGE: Oct 21, 1940 80 y.o.  Admit date: 03/11/2021 Discharge date: 03/15/2021  Admission Diagnoses: Metastatic breast cancer  Discharge Diagnoses: Metastatic breast cancer Active Problems:   Brain tumor [facility])   Brain lesion   Discharged Condition: good  Hospital Course: The patient was admitted on 03/11/2021 and taken to the operating room where the patient underwent a left frontotemporal craniotomy for resection of metastatic left temporal brain tumor. The patient tolerated the procedure well and was taken to the recovery room and then to the floor in stable condition. The hospital course was routine. There were no complications. The wound remained clean dry and intact. No complaints of neurological deficits. The patient remained afebrile with stable vital signs, and tolerated a regular diet. The patient continued to increase activities, and pain was well controlled with oral pain medications.   Consults: None  Significant Diagnostic Studies: radiology: MRI: MRI brain  Treatments: surgery:  Left frontotemporal stereotactic craniotomy for resection of temporal tumor Intraoperative use of stereotaxy Intraoperative use of microscope for microdissection    Discharge Exam: Blood pressure (!) 163/75, pulse (!) 59, temperature 97.7 F (36.5 C), temperature source Oral, resp. rate 16, height 5\' 8"  (1.727 m), weight 72.3 kg, SpO2 98 %.   Physical Exam: Patient is awake, A/O X 3, conversant, and in good spirits. VSS. Speech is fluent and appropriate. She MAEW with good strength that is symmetric bilaterally. 5/5 BUE/BLE. Sensation intact. PERLA, EOMI. CNs grossly intact, except for left House-Brackman 5/6 facial nerve function. Incision is well approximated with no drainage, erythema, or edema.  Disposition: Discharge disposition: 01-Home or Self Care        Allergies as of 03/15/2021   No Known Allergies       Medication List     STOP taking these medications    acetaminophen 500 MG tablet Commonly known as: TYLENOL       TAKE these medications    CALTRATE 600 PO Take 1 tablet by mouth in the morning and at bedtime.   Centrum Silver Ultra Womens Tabs Take 1 tablet by mouth at bedtime.   clopidogrel 75 MG tablet Commonly known as: PLAVIX Take 1 tablet (75 mg total) by mouth daily. Start taking on: March 25, 2021 What changed: These instructions start on March 25, 2021. If you are unsure what to do until then, ask your doctor or other care provider.   dexamethasone 4 MG tablet Commonly known as: DECADRON Take 1 tablet (4 mg total) by mouth every 12 (twelve) hours. What changed: when to take this   gabapentin 300 MG capsule Commonly known as: NEURONTIN Take 1 capsule (300 mg total) by mouth at bedtime.   HYDROcodone-acetaminophen 5-325 MG tablet Commonly known as: NORCO/VICODIN Take 1 tablet by mouth every 4 (four) hours as needed for moderate pain.   levETIRAcetam 500 MG tablet Commonly known as: KEPPRA Take 1 tablet (500 mg total) by mouth 2 (two) times daily.   psyllium 58.6 % powder Commonly known as: METAMUCIL Take 1 packet by mouth daily as needed (Constipation).   REFRESH DRY EYE THERAPY OP Place 1 drop into both eyes daily as needed (Dry eye).       ASK your doctor about these medications    levothyroxine 137 MCG tablet Commonly known as: SYNTHROID TAKE 1 TABLET(137 MCG) BY MOUTH DAILY   lisinopril 20 MG tablet Commonly known as: ZESTRIL TAKE 1 TABLET(20 MG) BY MOUTH DAILY   metoprolol succinate  100 MG 24 hr tablet Commonly known as: TOPROL-XL TAKE 1 TABLET BY MOUTH EVERY DAY WITH OR IMMEDIATELY FOLLOWING A MEAL   rosuvastatin 20 MG tablet Commonly known as: CRESTOR TAKE 1 TABLET BY MOUTH DAILY   sertraline 100 MG tablet Commonly known as: ZOLOFT TAKE 1 TABLET(100 MG) BY MOUTH DAILY        Follow-up Information     [facility].   Specialty: Home Health Services Why: The home health agency will contact you for the first home visit. Contact information: [place] [address] [phone]         Dirk Vanaman C, DO Follow up in 2 week(s).   Contact information: [address] [place] [address] [phone]                 Signed: Marvis Moeller, DNP, NP-C 03/15/2021, 10:51 AM

## 2021-03-15 NOTE — Plan of Care (Signed)

## 2021-03-15 NOTE — Anesthesia Postprocedure Evaluation (Signed)
Anesthesia Post Note  Patient: KEOSHA ROSSA  Procedure(s) Performed: LEFT FRONTOTEMPORAL CRANIOTOMY FOR RESECTION OF METASTATIC LESION (Left) APPLICATION OF CRANIAL NAVIGATION (Left)     Patient location during evaluation: PACU Anesthesia Type: General Level of consciousness: patient cooperative and awake Pain management: pain level controlled Vital Signs Assessment: post-procedure vital signs reviewed and stable Respiratory status: spontaneous breathing, nonlabored ventilation, respiratory function stable and patient connected to nasal cannula oxygen Cardiovascular status: blood pressure returned to baseline and stable Postop Assessment: no apparent nausea or vomiting Anesthetic complications: no   No notable events documented.  Last Vitals:  Vitals:   03/15/21 0455 03/15/21 0713  BP: (!) 156/77 (!) 163/75  Pulse: 70 (!) 59  Resp: 19 16  Temp: 36.5 C 36.5 C  SpO2: 95% 98%    Last Pain:  Vitals:   03/15/21 0800  TempSrc:   PainSc: 0-No pain                 Joanathan Affeldt

## 2021-03-15 NOTE — Plan of Care (Signed)
Patient is alert oriented x 4. Ambulatory with stand by assistance. Pt surgical site to forehead and left side, staples dry and intact. Pt has left facial droop, swalling to left eye has decreased.  Pt c/o pain prn morphine given with effective results. This am norco was given with effective results. Pt states she is ready to go home.    Problem: Education: Goal: Knowledge of General Education information will improve Description: Including pain rating scale, medication(s)/side effects and non-pharmacologic comfort measures Outcome: Completed/Met   Problem: Health Behavior/Discharge Planning: Goal: Ability to manage health-related needs will improve Outcome: Completed/Met   Problem: Clinical Measurements: Goal: Ability to maintain clinical measurements within normal limits will improve Outcome: Completed/Met Goal: Will remain free from infection Outcome: Completed/Met Goal: Diagnostic test results will improve Outcome: Completed/Met Goal: Respiratory complications will improve Outcome: Completed/Met Goal: Cardiovascular complication will be avoided Outcome: Completed/Met   Problem: Activity: Goal: Risk for activity intolerance will decrease Outcome: Completed/Met   Problem: Nutrition: Goal: Adequate nutrition will be maintained Outcome: Completed/Met   Problem: Coping: Goal: Level of anxiety will decrease Outcome: Completed/Met   Problem: Elimination: Goal: Will not experience complications related to bowel motility Outcome: Completed/Met Goal: Will not experience complications related to urinary retention Outcome: Completed/Met   Problem: Pain Managment: Goal: General experience of comfort will improve Outcome: Completed/Met   Problem: Safety: Goal: Ability to remain free from injury will improve Outcome: Completed/Met   Problem: Skin Integrity: Goal: Risk for impaired skin integrity will decrease Outcome: Completed/Met

## 2021-03-17 ENCOUNTER — Ambulatory Visit: Payer: Medicare Other | Admitting: Internal Medicine

## 2021-03-17 ENCOUNTER — Ambulatory Visit (HOSPITAL_BASED_OUTPATIENT_CLINIC_OR_DEPARTMENT_OTHER): Payer: Medicare Other | Admitting: Oncology

## 2021-03-17 DIAGNOSIS — C7931 Secondary malignant neoplasm of brain: Secondary | ICD-10-CM | POA: Insufficient documentation

## 2021-03-17 DIAGNOSIS — C3490 Malignant neoplasm of unspecified part of unspecified bronchus or lung: Secondary | ICD-10-CM | POA: Diagnosis not present

## 2021-03-17 DIAGNOSIS — C50411 Malignant neoplasm of upper-outer quadrant of right female breast: Secondary | ICD-10-CM

## 2021-03-17 DIAGNOSIS — Z510 Encounter for antineoplastic radiation therapy: Secondary | ICD-10-CM | POA: Diagnosis not present

## 2021-03-17 DIAGNOSIS — Z923 Personal history of irradiation: Secondary | ICD-10-CM | POA: Diagnosis not present

## 2021-03-17 DIAGNOSIS — Z87891 Personal history of nicotine dependence: Secondary | ICD-10-CM | POA: Diagnosis not present

## 2021-03-17 DIAGNOSIS — Z9221 Personal history of antineoplastic chemotherapy: Secondary | ICD-10-CM | POA: Diagnosis not present

## 2021-03-17 DIAGNOSIS — C3411 Malignant neoplasm of upper lobe, right bronchus or lung: Secondary | ICD-10-CM | POA: Diagnosis not present

## 2021-03-17 DIAGNOSIS — Z79899 Other long term (current) drug therapy: Secondary | ICD-10-CM | POA: Diagnosis not present

## 2021-03-17 DIAGNOSIS — Z171 Estrogen receptor negative status [ER-]: Secondary | ICD-10-CM

## 2021-03-17 LAB — SURGICAL PATHOLOGY

## 2021-03-17 NOTE — Progress Notes (Addendum)
ID: Debra Mcintyre   DOB: 11/01/1940  MR#: [id number]  EGB#:[id number]  Patient Care Team: Isaac Bliss, Rayford Halsted, MD as PCP - General (Internal Medicine) Kayna Suppa, Virgie Dad, MD as Consulting Physician (Oncology) [name], MD as Consulting Physician (Obstetrics and Gynecology) [name], MD as Consulting Physician (General Surgery) Rutherford Guys, MD as Consulting Physician (Ophthalmology) Minor, Orlie Pollen, DDS (Dentistry) Jannet Mantis, MD (Dermatology) Mickeal Skinner Acey Lav, MD as Consulting Physician (Psychiatry) Dawley, Theodoro Doing, DO as Consulting Physician  OTHER MD:  I connected with Debra Mcintyre on 03/17/21 at  1:00 PM EDT by phone and verified that I am speaking with the correct person using two identifiers.   I discussed the limitations, risks, security and privacy concerns of performing an evaluation and management service by telemedicine and the availability of in-person appointments. I also discussed with the patient that there may be a patient responsible charge related to this service. The patient expressed understanding and agreed to proceed.   Other persons participating in the visit and their role in the encounter: none   Patient's location: Home Provider's location: [place]  Total time spent: 15 min   CHIEF COMPLAINT: metastatic breast cancer (s/p right mastectomy)  CURRENT TREATMENT: dexamethasone   INTERVAL HISTORY: Debra Mcintyre was contacted today for follow up of her new metastatic breast cancer.   Since her last visit, she underwent craniotomy on 03/11/2021 under Dr. Reatha Armour. Pathology from the procedure is consistent from metastasis from a lung adenocarcinoma (MCS-2 [phone]).  Recall her most recent left screening mammogram from 09/24/2020 was negative.  We did check a CA 27-29 and it was mildly elevated at 65.0, which is nonspecific.   REVIEW OF SYSTEMS: A detailed review of systems today was stable, but the  patient was somewhat confused over the phone.   COVID 19 VACCINATION STATUS: Pfizer x 2  plus 2 boosters, most recently June 2020   BREAST CANCER HISTORY: From the original intake note:  Akiah had screening mammography at [facility] 04/10/2012 raising the question of some axillary lymph nodes on the right. Additional views of the right axilla and right axillary ultrasound performed 04/04/2012 showed 3 lymph nodes that appeared larger than prior. The largest measured 1.5 cm. 2 of them had cortical thickening. Close followup was suggested, and repeat right axillary ultrasound 06/25/2012 showed no significant change in the lymph nodes in question. Followup ultrasound in 3 months was recommended, but in the interim the patient saw her primary physician, [name] and ADL, and he set her up for a chest CT on 07/08/2012, which showed a mild asymmetric density in the upper mid right breast. There was a prominent right axillary lymph node measuring 1.9 cm, with a few smaller adjacent nodes asymmetric with compared to the left side. Again, short interval followup was recommended, and on 10/07/2012 the patient had digital right mammography and ultrasonography, now at the breast Center. Dr. [name] noted pleomorphic calcifications spanning an area of 6.3 cm without associated mass. Physical exam was unremarkable. Ultrasound showed an area of adenopathy in the right axilla measuring 1.6 cm, with a second lymph node with a thickened cortex measuring 1.2 cm. No suspicious mass was seen by ultrasound in the right breast.  Biopsy of the larger axillary lymph node on 10/07/2012 showed ([id number]) an invasive ductal carcinoma, triple negative, with an MIB-1 of 66%. Biopsy of the right breast the next day, [id number]) showed invasive ductal carcinoma, grade 3. Breast MRI 10/14/2012 showed a total  area of irregular enhancement in the right breast measuring up to 12 cm. MRI guided biopsy of an area in the upper inner quadrant of  the right breast on 10/23/2012 showed ([id number]) ductal carcinoma in situ, with foci worrisome for invasion.  The patient's subsequent history is as detailed below   PAST MEDICAL HISTORY: Past Medical History:  Diagnosis Date   Anemia    Arthritis    Breast cancer ([place]) 09/2012   right breast/right axillary lymph node t2,pn1, stage 11b, invasive ductal carcinma, grade 3, triple negative, with an mib-1 of 15%   Cataract    bilateral removed   Cerebral aneurysm, nonruptured    Cerebrovascular disease, unspecified    Colonic polyp    Dizziness    pt. states that she experiences this occassionally 05/03/15   Headache    occassionally   Heart murmur    History of chemotherapy    Hx of radiation therapy 05/27/13-08/13/13   right chest wall/right supraclavicular/axillary region 5220 cGy 29 sessions, right mastectomy/chest wall boost cGy 5 sessions   Hypertension    Does not see a cardiologist   Hypothyroidism    Inguinal hernia    Neuropathy    Pure hypercholesterolemia    Rosacea    Seasonal allergies    Stroke ([place]) 2006   no deficits - TIA   TIA (transient ischemic attack)    Wears glasses     PAST SURGICAL HISTORY: Past Surgical History:  Procedure Laterality Date   APPLICATION OF CRANIAL NAVIGATION Left 03/11/2021   Procedure: APPLICATION OF CRANIAL NAVIGATION;  Surgeon: Karsten Ro, DO;  Location: [place];  Service: Neurosurgery;  Laterality: Left;   BREAST BIOPSY Right 10/23/2012   BREAST BIOPSY Right 10/08/2012   BREAST BIOPSY Left 08/13/2019   REACTIVE LYMPH NODE WITH FOLLICULAR HYPERPLASIA   COLONOSCOPY     COLONOSCOPY W/ POLYPECTOMY     CRANIOTOMY Left 03/11/2021   Procedure: LEFT FRONTOTEMPORAL CRANIOTOMY FOR RESECTION OF METASTATIC LESION;  Surgeon: Karsten Ro, DO;  Location: [place];  Service: Neurosurgery;  Laterality: Left;   ESOPHAGOGASTRODUODENOSCOPY     Left middle cerebral artery angioplasty  2004   by [name] had 2 follow up [place] Right 04/23/2013   Procedure: RIGHT TOTAL MASTECTOMY WITH SENTINEL LYMPH NODE BIOPSY;  Surgeon: [name], MD;  Location: [place];  Service: General;  Laterality: Right;   PORTACATH PLACEMENT N/A 11/05/2012   Procedure: INSERTION PORT-A-CATH;  Surgeon: [name], MD;  Location: [place];  Service: General;  Laterality: N/A;   RADIOLOGY WITH ANESTHESIA N/A 05/04/2015   Procedure: MRI BRAIN WITH AND WITHOUT;  Surgeon: Medication Radiologist, MD;  Location: [place];  Service: Radiology;  Laterality: N/A;    FAMILY HISTORY Family History  Problem Relation Age of Onset   Lung cancer Mother 70   Heart disease Father    Colon cancer Brother 75   Alcohol abuse Brother    Colon cancer Maternal Uncle        dx in his 64s   Colon cancer Maternal Aunt    Ovarian cancer Other 47   Lung cancer Maternal Aunt    Breast cancer Sister    Rectal cancer Neg Hx    Stomach cancer Neg Hx    Esophageal cancer Neg Hx    the patient's father died at the age of 48. The patient's mother died at the age of 54 from  lung cancer. She was not a smoker. The cancer was diagnosed when she was 78 years old. The patient's mother had 2 sisters and one brother. One of those sisters had lung cancer and a brother had colon cancer, both diagnosed in their 60s. The patient herself has one brother who died from colon cancer at the age of 60. That brother has a daughter who was diagnosed with uterine cancer at the age of 40. All this raises the question of possible Lynch syndrome and I have referred the patient for genetics counseling. The patient does have one sister who was diagnosed with breast cancer in 2015.   GYNECOLOGIC HISTORY: Menarche age 59, menopause around 23. The patient is GX P0. She took birth control pills for many years with no complications.   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 Her stepchildren are [name] who lives in "[place]" and is financial vice president of [employer], and Gilmanton who lives in [place] and works as a Tree surgeon. The patient has 5 grandchildren including a granddaughter who is Passenger transport manager for [employer]   ADVANCED DIRECTIVES: Not in place; at the 02/17/2021 visit the patient was given the appropriate forms to complete and notarized at her discretion.  She is planning to name her friend Gladstone Lighter as healthcare power of attorney   HEALTH MAINTENANCE: Social History   Tobacco Use   Smoking status: Former    Packs/day: 1.00    Years: 20.00    Pack years: 20.00    Types: Cigarettes    Quit date: 04/02/1986    Years since quitting: 34.9   Smokeless tobacco: Never  Vaping Use   Vaping Use: Never used  Substance Use Topics   Alcohol use: No   Drug use: No     Colonoscopy: 02/10/2015  PAP: 2012  Bone density: 05/01/2017, T-score of -0.4.  Lipid panel: 06/22/2017  No Known Allergies  Current Outpatient Medications  Medication Sig Dispense Refill   Calcium Carbonate (CALTRATE 600 PO) Take 1 tablet by mouth in the morning and at bedtime.     [START ON 03/25/2021] clopidogrel (PLAVIX) 75 MG tablet Take 1 tablet (75 mg total) by mouth daily. 90 tablet 1   dexamethasone (DECADRON) 4 MG tablet Take 1 tablet (4 mg total) by mouth every 12 (twelve) hours. 60 tablet 1   gabapentin (NEURONTIN) 300 MG capsule Take 1 capsule (300 mg total) by mouth at bedtime. 90 capsule 4   Glycerin-Polysorbate 80 (REFRESH DRY EYE THERAPY OP) Place 1 drop into both eyes daily as needed (Dry eye).     HYDROcodone-acetaminophen (NORCO/VICODIN) 5-325 MG tablet Take 1 tablet by mouth every 4 (four) hours as needed for moderate pain. 30 tablet 0   levETIRAcetam (KEPPRA) 500 MG tablet Take 1 tablet (500 mg total) by mouth 2 (two) times daily. 60 tablet 1   levothyroxine (SYNTHROID) 137 MCG tablet TAKE 1  TABLET(137 MCG) BY MOUTH DAILY (Patient taking differently: Take 137 mcg by mouth daily before breakfast.) 90 tablet 1   lisinopril (ZESTRIL) 20 MG tablet TAKE 1 TABLET(20 MG) BY MOUTH DAILY (Patient taking differently: Take 10 mg by mouth at bedtime.) 90 tablet 1   metoprolol succinate (TOPROL-XL) 100 MG 24 hr tablet TAKE 1 TABLET BY MOUTH EVERY DAY WITH OR IMMEDIATELY FOLLOWING A MEAL (Patient taking differently: Take 50 mg by mouth at bedtime.) 90 tablet 1  Multiple Vitamins-Minerals (CENTRUM SILVER ULTRA WOMENS) TABS Take 1 tablet by mouth at bedtime.     psyllium (METAMUCIL) 58.6 % powder Take 1 packet by mouth daily as needed (Constipation).     rosuvastatin (CRESTOR) 20 MG tablet TAKE 1 TABLET BY MOUTH DAILY (Patient taking differently: Take 20 mg by mouth at bedtime.) 90 tablet 0   sertraline (ZOLOFT) 100 MG tablet TAKE 1 TABLET(100 MG) BY MOUTH DAILY (Patient taking differently: Take 100 mg by mouth in the morning.) 90 tablet 1   No current facility-administered medications for this visit.    OBJECTIVE: White woman who appears younger than stated age  There were no vitals filed for this visit.     There is no height or weight on file to calculate BMI.    ECOG FS: 1 There were no vitals filed for this visit.  Telemedicine visit 03/17/2021  LAB RESULTS: Lab Results  Component Value Date   WBC 10.8 (H) 03/12/2021   NEUTROABS 8.9 (H) 02/17/2021   HGB 11.3 (L) 03/12/2021   HCT 33.7 (L) 03/12/2021   MCV 90.3 03/12/2021   PLT 149 (L) 03/12/2021      Chemistry      Component Value Date/Time   NA 136 03/12/2021 0500   NA 141 03/21/2017 1105   K 3.7 03/12/2021 0500   K 4.3 03/21/2017 1105   CL 105 03/12/2021 0500   CL 102 01/28/2013 0909   CO2 22 03/12/2021 0500   CO2 25 03/21/2017 1105   BUN 12 03/12/2021 0500   BUN 12.5 03/21/2017 1105   CREATININE 0.71 03/12/2021 0500   CREATININE 0.78 02/17/2021 1529   CREATININE 0.79 06/30/2020 1446   CREATININE 0.7 03/21/2017 1105       Component Value Date/Time   CALCIUM 8.5 (L) 03/12/2021 0500   CALCIUM 9.8 03/21/2017 1105   ALKPHOS 57 02/17/2021 1529   ALKPHOS 48 03/21/2017 1105   AST 17 02/17/2021 1529   AST 21 03/21/2017 1105   ALT 19 02/17/2021 1529   ALT 21 03/21/2017 1105   BILITOT 0.6 02/17/2021 1529   BILITOT 0.86 03/21/2017 1105      STUDIES: CT Chest W Contrast  Result Date: 03/01/2021 CLINICAL DATA:  Brain lesions noted on recent brain MRI. Evaluate for metastatic disease. History of breast cancer. EXAM: CT CHEST WITH CONTRAST TECHNIQUE: Multidetector CT imaging of the chest was performed during intravenous contrast administration. CONTRAST:  55mL OMNIPAQUE IOHEXOL 350 MG/ML SOLN COMPARISON:  CT scan 10/29/2013 FINDINGS: Cardiovascular: The heart is normal in size. No pericardial effusion. There is tortuosity, ectasia and calcification of the thoracic aorta. No dissection. Mediastinum/Nodes: Enlarged mediastinal and right hilar lymph nodes are noted. Right paratracheal node on image number 45/2 measures 13 mm. Lower right paratracheal node on image 54/2 measures 11 mm. Subcarinal node on image number 69/2 measures 11 mm. Right hilar/infrahilar node on image 76/2 measures 12.5 mm. There is a epicardial node just above the left hemidiaphragm on image 139/2 the measures 15 mm. The esophagus is grossly normal. Lungs/Pleura: Peripheral right upper lobe pulmonary lesion on image number 95/5 measures 2.0 x 1.5 cm and is slightly irregular/spiculated margins. This is more worrisome from a primary lung neoplasm then a metastatic lesion from breast cancer. 5 mm subpleural nodule in the right lower lobe is unchanged since the prior CT scan and most likely a subpleural lymph node. 4.5 mm subpleural nodule on image number 85/5 previously measured 3 mm. 2 mm subpleural nodule in the left lower lobe  on image number 97/5 appears stable. Upper Abdomen: No significant upper abdominal findings. No evidence of hepatic or adrenal  gland metastasis. Stable aortic calcifications. Musculoskeletal: Status post right mastectomy. No findings suspicious for recurrent chest wall tumor, axillary or supraclavicular adenopathy. The left breast is grossly normal by CT. Scoliosis and advanced degenerative changes involving the spine. No obvious lytic or sclerotic bone lesions. IMPRESSION: 1. 2.0 x 1.5 cm peripheral right upper lobe pulmonary lesion most suspicious for primary lung neoplasm. 2. Enlarged mediastinal and right hilar lymph nodes suspicious for metastatic disease. 3. Stable small bilateral pulmonary nodules. 4. No findings for upper abdominal metastatic disease or osseous metastatic disease. 5. PET-CT may be helpful for further evaluation but the right lung lesion may require biopsy. 6. Aortic atherosclerosis. Aortic Atherosclerosis (ICD10-I70.0). Electronically Signed   By: Marijo Sanes M.D.   On: 03/01/2021 16:29   MR BRAIN W WO CONTRAST  Addendum Date: 03/12/2021   ADDENDUM REPORT: 03/12/2021 22:48 ADDENDUM: In addition to the initially described findings, note made of trace susceptibility artifact layering within the occipital horns of both lateral ventricles, consistent with trace intraventricular hemorrhage, likely related to redistribution. No hydrocephalus. Electronically Signed   By: Jeannine Boga M.D.   On: 03/12/2021 22:48   Result Date: 03/12/2021 CLINICAL DATA:  Follow-up examination status post craniotomy for tumor resection. EXAM: MRI HEAD WITHOUT AND WITH CONTRAST TECHNIQUE: Multiplanar, multiecho pulse sequences of the brain and surrounding structures were obtained without and with intravenous contrast. CONTRAST:  7.56mL GADAVIST GADOBUTROL 1 MMOL/ML IV SOLN COMPARISON:  Prior brain MRI from 02/24/2021. FINDINGS: Brain: Postoperative changes from interval left pterional craniotomy are seen. Scattered postoperative pneumocephalus overlies the left greater than right anterior frontal convexities. Previously seen  left temporal lobe metastasis has been resected, with scattered postoperative blood products within the resection cavity. Small amount of residual enhancement along the posterior/inferior margin of the resection cavity, likely reflecting a small amount of residual tumor (series 18, images 18-13). Persistent dural infiltration with posterior extension to involve the left geniculate ganglion, with additional extension along the labyrinthine segment proximally and facial nerve distally, similar to preoperative exam (series 18, images 13, 11). Mild localized edema within the adjacent left temporal lobe without significant regional mass effect. 7 mm focus of restricted diffusion involving the left frontal cortex consistent with a tiny acute ischemic nonhemorrhagic infarct (series 5, image 84). Additional vague diffusion abnormality along the posterior falx favored to be related to susceptibility artifact. No other evidence for acute or subacute ischemia. Additional metastatic lesions involving the right occipital lobe and right cerebellum again noted, unchanged measuring 1.1 cm in 2.4 cm respectively. Associated vasogenic edema is not significantly changed. No other mass lesion or abnormal enhancement. Underlying chronic microvascular ischemic disease involving the supratentorial white matter and pons again noted. Vascular: Major intracranial vascular flow voids are maintained. Skull and upper cervical spine: Craniocervical junction within normal limits. Bone marrow signal intensity normal. Post craniotomy changes noted at the left frontotemporal calvarium and scalp. Sinuses/Orbits: Prior bilateral ocular lens replacement. Scattered mucosal thickening within the ethmoidal air cells. Mastoid air cells remain clear. Other: None. IMPRESSION: 1. Postoperative changes from interval left pterional craniotomy for tumor resection. Small amount of residual enhancement along the posterior/inferior margin of the resection cavity  likely reflects a small amount of residual tumor, with similar dural infiltration and extension to involve the geniculate ganglion and seventh cranial nerve as above. 2. 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 3. Additional metastatic lesions  involving the right occipital lobe and right cerebellum, unchanged. 4. Underlying chronic microvascular ischemic disease. Electronically Signed: By: Jeannine Boga M.D. On: 03/12/2021 22:27   MR BRAIN W WO CONTRAST  Result Date: 02/26/2021 CLINICAL DATA:  History of breast cancer.  Preop. EXAM: MRI HEAD WITHOUT AND WITH CONTRAST TECHNIQUE: Multiplanar, multiecho pulse sequences of the brain and surrounding structures were obtained without and with intravenous contrast. CONTRAST:  67mL GADAVIST GADOBUTROL 1 MMOL/ML IV SOLN COMPARISON:  02/10/2021 FINDINGS: Brain: 3 enhancing brain lesions are again identified: *2.4 cm in the lateral left cerebellum. *3.2 cm in the inferior left temporal lobe *1.1 cm in the right occipital temporal cortical region. Masses all have necrotic features with mild adjacent edema. The left temporal lobe mass is notable for inferior dural infiltration and extension along the facial nerve at the level of the geniculate ganglion. Proximally there is extension into the deep canalicular segment and distally both the horizontal and vertical segments appear asymmetrically enhancing, reaching the stylomastoid foramina. This correlates with history of facial weakness the left. Chronic small vessel ischemia in the hemispheric white matter. Age normal brain volume. No hydrocephalus or collection Vascular: Normal flow voids and vascular enhancements. Skull and upper cervical spine: Cervical spine degeneration. No evident bone lesion Sinuses/Orbits: Bilateral cataract resection. 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 The left temporal metastasis is notable for inferior dural infiltration with tracking along the  left facial nerve, extending both directions from the geniculate ganglion. Electronically Signed   By: Monte Fantasia M.D.   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 EXAM: NUCLEAR MEDICINE WHOLE BODY BONE SCAN TECHNIQUE: Whole body anterior and posterior images were obtained approximately 3 hours after intravenous injection of radiopharmaceutical. RADIOPHARMACEUTICALS:  25.0 millicurie mCi [id number] MDP IV COMPARISON:  CT scan of the chest 02/28/2021 FINDINGS: Focal uptake in the distal femur and proximal tibia corresponding with the lateral compartment of the right knee. Additional small foci of increased radiotracer uptake noted in the bilateral wrists, ankles, and shoulders consistent with degenerative uptake. The patient has fairly significant dextroconvex scoliosis of the thoracolumbar spine. Multiple small foci of increased radiotracer uptake throughout the spine corresponding with areas of degenerative change. No focal abnormal increased uptake suggestive of metastatic disease. Normal urinary excretion. IMPRESSION: 1. Negative for osseous metastatic disease. 2. Focal uptake in the lateral compartment of the right knee suggests underlying osteoarthritis. 3. Dextroconvex scoliosis of the thoracolumbar spine. Electronically Signed   By: Jacqulynn Cadet M.D.   On: 03/06/2021 11:02   DG Foot 2 Views Left  Result Date: 03/09/2021 Please see detailed radiograph report in office note.    ASSESSMENT: 80 y.o. Debra Mcintyre woman   (1)  status post right breast upper outer quadrant and right axillary lymph node biopsies February 2014 of a clinical T2, pN1, stage IIB  invasive ductal carcinoma, grade 3, triple negative, with an MIB-1 of 15%  (2)  a third biopsy from a different quadrant 10/23/2012 showed ductal carcinoma in situ  (3)  treated in the neoadjuvant setting with 4 dose dense  cycles of doxorubicin/ cyclophosphamide 12/23/2012, followed by 7 weekly doses of paclitaxel and completed 03/03/2013.  (4) status post right mastectomy and sentinel lymph node sampling 04/23/2013 for a residual ypT1b ypN1 invasive ductal carcinoma, grade 3, again triple negative (A) a total of 10 right axillary lymph nodes were removed, 2 with micrometastatic deposits  (  B) the patient opted against reconstruction  (5) adjuvant radiation completed 08/13/2013  (6) the patient underwent genetics counseling but declined testing  METASTATIC LUNG CANCER: June 2022  (A) brain MRI 02/10/2021 shows 3 separate brain masses compatible with metastatic disease (B) brain MRI for stereotactic planning protocol 02/24/2021 shows the same 3 lesions, with a left temporal metastasis tracking along the left facial nerve (C) CT scan of the chest 08/31/2020 shows a 2.0 right upper lobe nodule suspicious for primary lung cancer, with enlarged mediastinal and right hilar lymph nodes. (D) bone scan 09/04/2020 shows no evidence of bony metastatic disease (E) CA 27-29 on 02/17/2021 was 65.0 (non-specific) (F) status post resection of the left frontotemporal mass 03/11/2021, with pathology showing lung adenocarcinoma  (7) CNS radiation:  (8) systemic therapy:   PLAN:  I discussed with Debra Mcintyre that what we are finding in her brain is spread from her new lung cancer and is not related to her prior breast cancer.  She had difficulty understanding this and kept asking if there were going to "let her keep some of her lung".  She is scheduled for radiation next week and I have asked her to simply stop in my office so I can give her the information in writing.  She understands that our lung specialist is Dr. Earlie Server and that I will be alerting him as to her case.  Likely he will see her shortly after she completes her planned brain radiation. She is also already scheduled to see Dr. Mickeal Skinner next week.   Virgie Dad. Rocco Kerkhoff  MD Oncology and Hematology [facility] [place], [place] [address] Tel. [phone]  Joylene Igo [phone]   I, Wilburn Mylar, am acting as scribe for Dr. Sarajane Jews C. Caterina Racine.  I, Lurline Del MD, have reviewed the above documentation for accuracy and completeness, and I agree with the above.   *Total Encounter Time as defined by the Centers for Medicare and Medicaid Services includes, in addition to the face-to-face time of a patient visit (documented in the note above) non-face-to-face time: obtaining and reviewing outside history, ordering and reviewing medications, tests or procedures, care coordination (communications with other health care professionals or caregivers) and documentation in the medical record.

## 2021-03-18 ENCOUNTER — Telehealth: Payer: Self-pay | Admitting: Internal Medicine

## 2021-03-18 NOTE — Telephone Encounter (Signed)
Received a staff msg from to schedule Debra Mcintyre to see Dr. Julien Nordmann for lung cancer. She has been scheduled to see Dr. Julien Nordmann on 8/12 at 9am w/labs at 830am. I lft the appt date and time on her vm.

## 2021-03-21 NOTE — Progress Notes (Signed)
Location/Histology of Brain Tumor: Metastatic Lung Cancer to Brain  History of Breast Cancer- Right Chest Wall/ regional Lymph nodes  Patient presented to the ER with left side Bells Palsy. Harder to see out of left eye. Mouth was drooped and numb.   MRI Brain 03/12/2021: Postoperative changes from interval left pterional craniotomy for tumor resection. Small amount of residual enhancement along the posterior/inferior margin of the resection cavity likely reflects a small amount of residual tumor, with similar dural infiltration and extension to involve the geniculate ganglion and seventh cranial nerve as above.  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  Additional metastatic lesions involving the right occipital lobe and right cerebellum, unchanged.  CT Chest 02/28/2021: 2.0 x 1.5 cm peripheral right upper lobe pulmonary lesion most suspicious for primary lung neoplasm.  Enlarged mediastinal and right hilar lymph nodes suspicious for metastatic disease.  Stable small bilateral pulmonary nodules.  No findings for upper abdominal metastatic disease or osseous metastatic disease.  MRI Brain 02/24/2021: Shows the 3 lesions with a left temporal metastasis tracking along the left facial nerve. 2.4 cm in the lateral left cerebellum.  3.2 cm in the inferior left temporal lobe.  1.1 cm in the right occipital temporal cortical region.  MRI Brain 02/10/2021: 3 separate brain masses compatible with metastatic disease.  Bone Scan 09/04/2020: No evidence of bony metastatic disease.    Past or anticipated interventions, if any, per neurosurgery:  Dr. Reatha Armour -Left Frontotemporal Craniotomy -Follow-up appt scheduled for 2 weeks, exact day unknown. -Staples remain intact, skin is clean and dry without signs of infection.  Past or anticipated interventions, if any, per medical oncology:  Dr. Mickeal Skinner 03/22/2021  Dr. Julien Nordmann 03/25/2021  [name] 03/17/2021 -I discussed with Debra Mcintyre  that what we are finding in her brain is spread from her new lung cancer and is not related to her prior breast cancer. -She is scheduled for radiation next week and I have asked her to simply stop in my office so I can give her the information in writing.   -She understands that our lung specialist is Dr. Earlie Server and that I will be alerting him as to her case.   -Likely he will see her shortly after she completes her planned brain radiation. She is also already scheduled to see Dr. Mickeal Skinner next week.    Dose of Decadron, if applicable: 4 mg every 12 hours  Recent neurologic symptoms, if any:  Seizures: Patient Denies Headaches: Occasional  Nausea: Patient denies Dizziness/ataxia: Patient denies Difficulty with hand coordination: Patient denies Focal numbness/weakness: Occasional Visual deficits/changes: Left eye causing blurred vision, stinging. Denies double vision.  Confusion/Memory deficits: Difficulty remembering names and people.   SAFETY ISSUES: Prior radiation? Yes, right CW/regional nodes. 2014 with Dr. Valere Dross.  Right Chest wall/right supraclavicular/axillary region 5220 cGy in 29 sessions, right mastectomy/chest wall boost 1000 cGy in 5 sessions. Pacemaker/ICD? No Possible current pregnancy? No Is the patient on methotrexate? No  Additional Complaints / other details:

## 2021-03-22 ENCOUNTER — Inpatient Hospital Stay: Payer: Medicare Other | Admitting: Internal Medicine

## 2021-03-22 ENCOUNTER — Ambulatory Visit
Admission: RE | Admit: 2021-03-22 | Discharge: 2021-03-22 | Disposition: A | Discharge status: Home / Self Care | Payer: Medicare Other | Source: Ambulatory Visit | Attending: Radiation Oncology | Admitting: Radiation Oncology

## 2021-03-22 ENCOUNTER — Other Ambulatory Visit: Payer: Self-pay | Admitting: Radiation Therapy

## 2021-03-22 ENCOUNTER — Other Ambulatory Visit: Payer: Self-pay

## 2021-03-22 VITALS — BP 127/79 | HR 68 | Temp 98.6°F | Resp 18 | Ht 69.0 in | Wt 160.2 lb

## 2021-03-22 DIAGNOSIS — C7931 Secondary malignant neoplasm of brain: Secondary | ICD-10-CM

## 2021-03-22 DIAGNOSIS — Z9221 Personal history of antineoplastic chemotherapy: Secondary | ICD-10-CM | POA: Diagnosis not present

## 2021-03-22 DIAGNOSIS — E7800 Pure hypercholesterolemia, unspecified: Secondary | ICD-10-CM | POA: Diagnosis not present

## 2021-03-22 DIAGNOSIS — I10 Essential (primary) hypertension: Secondary | ICD-10-CM | POA: Diagnosis not present

## 2021-03-22 DIAGNOSIS — C7949 Secondary malignant neoplasm of other parts of nervous system: Secondary | ICD-10-CM

## 2021-03-22 DIAGNOSIS — Z853 Personal history of malignant neoplasm of breast: Secondary | ICD-10-CM | POA: Diagnosis not present

## 2021-03-22 DIAGNOSIS — Z923 Personal history of irradiation: Secondary | ICD-10-CM | POA: Diagnosis not present

## 2021-03-22 DIAGNOSIS — Z801 Family history of malignant neoplasm of trachea, bronchus and lung: Secondary | ICD-10-CM | POA: Diagnosis not present

## 2021-03-22 DIAGNOSIS — Z800 Family history of malignant neoplasm of digestive organs: Secondary | ICD-10-CM | POA: Diagnosis not present

## 2021-03-22 DIAGNOSIS — Z8673 Personal history of transient ischemic attack (TIA), and cerebral infarction without residual deficits: Secondary | ICD-10-CM | POA: Diagnosis not present

## 2021-03-22 DIAGNOSIS — Z79899 Other long term (current) drug therapy: Secondary | ICD-10-CM | POA: Diagnosis not present

## 2021-03-22 DIAGNOSIS — C3411 Malignant neoplasm of upper lobe, right bronchus or lung: Secondary | ICD-10-CM | POA: Diagnosis not present

## 2021-03-22 DIAGNOSIS — I700 Atherosclerosis of aorta: Secondary | ICD-10-CM | POA: Diagnosis not present

## 2021-03-22 DIAGNOSIS — Z87891 Personal history of nicotine dependence: Secondary | ICD-10-CM | POA: Diagnosis not present

## 2021-03-22 DIAGNOSIS — Z8041 Family history of malignant neoplasm of ovary: Secondary | ICD-10-CM | POA: Insufficient documentation

## 2021-03-22 DIAGNOSIS — Z86010 Personal history of colonic polyps: Secondary | ICD-10-CM | POA: Diagnosis not present

## 2021-03-22 DIAGNOSIS — C3490 Malignant neoplasm of unspecified part of unspecified bronchus or lung: Secondary | ICD-10-CM

## 2021-03-22 DIAGNOSIS — D649 Anemia, unspecified: Secondary | ICD-10-CM | POA: Insufficient documentation

## 2021-03-22 DIAGNOSIS — E039 Hypothyroidism, unspecified: Secondary | ICD-10-CM | POA: Diagnosis not present

## 2021-03-22 DIAGNOSIS — C3491 Malignant neoplasm of unspecified part of right bronchus or lung: Secondary | ICD-10-CM | POA: Diagnosis not present

## 2021-03-22 MED ORDER — MEMANTINE HCL 5 MG PO TABS: ORAL_TABLET | ORAL | 0 refills | Status: AC

## 2021-03-22 MED ORDER — MEMANTINE HCL 10 MG PO TABS: 10.0000 mg | ORAL_TABLET | Freq: Two times a day (BID) | ORAL | 4 refills | Status: AC

## 2021-03-22 MED ORDER — HYDROCODONE-ACETAMINOPHEN 5-325 MG PO TABS: 1.0000 | ORAL_TABLET | ORAL | 0 refills | Status: AC | PRN

## 2021-03-22 NOTE — Addendum Note (Signed)
Encounter addended by: Kyung Rudd, MD on: 03/22/2021 3:30 PM  Actions taken: Order list changed

## 2021-03-22 NOTE — Progress Notes (Signed)
Radiation Oncology         [phone] ________________________________  Name: LORRAINA Mcintyre        MRN: [id number]  Date of Service: 03/22/2021 DOB: Sep 25, 1940  NO:[id number] Debra Beals, MD  Magrinat, Virgie Dad, MD     REFERRING PHYSICIAN: Magrinat, Virgie Dad, MD   DIAGNOSIS: The primary encounter diagnosis was Brain metastases [facility]). A diagnosis of Lung cancer metastatic to brain [facility]) was also pertinent to this visit.   HISTORY OF PRESENT ILLNESS: Debra Mcintyre is a 80 y.o. female seen at the request of 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  She was treated with neoadjuvant chemotherapy followed by right mastectomy with sentinel lymph node sampling, 2 of her lymph nodes contained disease so she received adjuvant radiotherapy which was completed in December 2014.  She presented in June 2022 with facial asymmetry and headache and MRI of the brain with and without contrast on 02/10/2021 showed 3 enhancing masses in the brain concerning for metastatic disease within the right cerebellum measuring 24 mm with associated edema and central necrosis, a 3 cm enhancing lesion in the left inferior temporal lobe with central necrosis and mild edema and a 1 cm mass in the right lateral occipital cortex.  Preop imaging with SRS protocol on 02/24/2021 measured the lesion is at 2.4 cm in the lateral left cerebellum, 3.2 cm in the inferior left temporal lobe, and 1.1 cm in the right occipital temporal cortical region.  She underwent left frontotemporal craniotomy on 03/11/2021 postoperative changes including blood products in the ventricles were seen on postop imaging the following day and there was a small amount of residual enhancement along the posterior/inferior margin. In discussion in brain oncology conference however there were concerns about leptomeningeal disease. Final pathology was consistent with adenocarcinoma of  lung primary, and imaging of the chest prior to surgery on 02/28/2021 showed a 2 x 1.5 cm right upper lobe lesion with metastatic disease to the mediastinum and right hilum no other areas of metastatic disease were seen.  She had a phone conversation with [name] this week, he plans to see her again to discuss systemic therapy and she is seen today to discuss radiotherapy to the brain.  She remains on dexamethasone 4 mg twice daily.     PREVIOUS RADIATION THERAPY: Yes    05/27/2013 through 08/13/2013:                                                    Right chest wall/right supraclavicular/axillary region 52.2 Gy in 29 sessions, right mastectomy/chest wall boost 10Gy 5 sessions        PAST MEDICAL HISTORY:  Past Medical History:  Diagnosis Date   Anemia    Arthritis    Breast cancer ([place]) 09/2012   right breast/right axillary lymph node t2,pn1, stage 11b, invasive ductal carcinma, grade 3, triple negative, with an mib-1 of 15%   Cataract    bilateral removed   Cerebral aneurysm, nonruptured    Cerebrovascular disease, unspecified    Colonic polyp    Dizziness    pt. states that she experiences this occassionally 05/03/15   Headache    occassionally   Heart murmur    History of chemotherapy    Hx of  radiation therapy 05/27/13-08/13/13   right chest wall/right supraclavicular/axillary region 5220 cGy 29 sessions, right mastectomy/chest wall boost cGy 5 sessions   Hypertension    Does not see a cardiologist   Hypothyroidism    Inguinal hernia    Neuropathy    Pure hypercholesterolemia    Rosacea    Seasonal allergies    Stroke ([place]) 2006   no deficits - TIA   TIA (transient ischemic attack)    Wears glasses        PAST SURGICAL HISTORY: Past Surgical History:  Procedure Laterality Date   APPLICATION OF CRANIAL NAVIGATION Left 03/11/2021   Procedure: APPLICATION OF CRANIAL NAVIGATION;  Surgeon: Karsten Ro, DO;  Location: [place];  Service: Neurosurgery;  Laterality:  Left;   BREAST BIOPSY Right 10/23/2012   BREAST BIOPSY Right 10/08/2012   BREAST BIOPSY Left 08/13/2019   REACTIVE LYMPH NODE WITH FOLLICULAR HYPERPLASIA   COLONOSCOPY     COLONOSCOPY W/ POLYPECTOMY     CRANIOTOMY Left 03/11/2021   Procedure: LEFT FRONTOTEMPORAL CRANIOTOMY FOR RESECTION OF METASTATIC LESION;  Surgeon: Karsten Ro, DO;  Location: [place];  Service: Neurosurgery;  Laterality: Left;   ESOPHAGOGASTRODUODENOSCOPY     Left middle cerebral artery angioplasty  2004   by [name] had 2 follow up [place] Right 04/23/2013   Procedure: RIGHT TOTAL MASTECTOMY WITH SENTINEL LYMPH NODE BIOPSY;  Surgeon: [name], MD;  Location: [place];  Service: General;  Laterality: Right;   PORTACATH PLACEMENT N/A 11/05/2012   Procedure: INSERTION PORT-A-CATH;  Surgeon: [name], MD;  Location: [place];  Service: General;  Laterality: N/A;   RADIOLOGY WITH ANESTHESIA N/A 05/04/2015   Procedure: MRI BRAIN WITH AND WITHOUT;  Surgeon: Medication Radiologist, MD;  Location: [place];  Service: Radiology;  Laterality: N/A;     FAMILY HISTORY:  Family History  Problem Relation Age of Onset   Lung cancer Mother 53   Heart disease Father    Colon cancer Brother 36   Alcohol abuse Brother    Colon cancer Maternal Uncle        dx in his 17s   Colon cancer Maternal Aunt    Ovarian cancer Other 47   Lung cancer Maternal Aunt    Breast cancer Sister    Rectal cancer Neg Hx    Stomach cancer Neg Hx    Esophageal cancer Neg Hx      SOCIAL HISTORY:  reports that she quit smoking about 34 years ago. Her smoking use included cigarettes. She has a 20.00 pack-year smoking history. She has never used smokeless tobacco. She reports that she does not drink alcohol and does not use drugs. The patient is married and lives in [place].    ALLERGIES: Patient has no known allergies.   MEDICATIONS:  Current Outpatient  Medications  Medication Sig Dispense Refill   memantine (NAMENDA) 10 MG tablet Take 1 tablet (10 mg total) by mouth 2 (two) times daily. 60 tablet 4   memantine (NAMENDA) 5 MG tablet Begin this prescription the first day of brain radiation. Week 1: take one tablet po qam. Week 2: take one tablet qam and qpm. Week 3: take two tablets qam, and one tablet po q pm. Week 4: take two tablets qam and qpm. Fill subsequent prescription q month. 70 tablet 0   Calcium Carbonate (CALTRATE 600 PO) Take 1 tablet by mouth in the morning and at bedtime.     [  START ON 03/25/2021] clopidogrel (PLAVIX) 75 MG tablet Take 1 tablet (75 mg total) by mouth daily. 90 tablet 1   dexamethasone (DECADRON) 4 MG tablet Take 1 tablet (4 mg total) by mouth every 12 (twelve) hours. 60 tablet 1   gabapentin (NEURONTIN) 300 MG capsule Take 1 capsule (300 mg total) by mouth at bedtime. 90 capsule 4   Glycerin-Polysorbate 80 (REFRESH DRY EYE THERAPY OP) Place 1 drop into both eyes daily as needed (Dry eye).     HYDROcodone-acetaminophen (NORCO/VICODIN) 5-325 MG tablet Take 1 tablet by mouth every 4 (four) hours as needed for moderate pain. 30 tablet 0   levETIRAcetam (KEPPRA) 500 MG tablet Take 1 tablet (500 mg total) by mouth 2 (two) times daily. 60 tablet 1   levothyroxine (SYNTHROID) 137 MCG tablet TAKE 1 TABLET(137 MCG) BY MOUTH DAILY (Patient taking differently: Take 137 mcg by mouth daily before breakfast.) 90 tablet 1   lisinopril (ZESTRIL) 20 MG tablet TAKE 1 TABLET(20 MG) BY MOUTH DAILY (Patient taking differently: Take 10 mg by mouth at bedtime.) 90 tablet 1   metoprolol succinate (TOPROL-XL) 100 MG 24 hr tablet TAKE 1 TABLET BY MOUTH EVERY DAY WITH OR IMMEDIATELY FOLLOWING A MEAL (Patient taking differently: Take 50 mg by mouth at bedtime.) 90 tablet 1   Multiple Vitamins-Minerals (CENTRUM SILVER ULTRA WOMENS) TABS Take 1 tablet by mouth at bedtime.     psyllium (METAMUCIL) 58.6 % powder Take 1 packet by mouth daily as needed  (Constipation).     rosuvastatin (CRESTOR) 20 MG tablet TAKE 1 TABLET BY MOUTH DAILY (Patient taking differently: Take 20 mg by mouth at bedtime.) 90 tablet 0   sertraline (ZOLOFT) 100 MG tablet TAKE 1 TABLET(100 MG) BY MOUTH DAILY (Patient taking differently: Take 100 mg by mouth in the morning.) 90 tablet 1   No current facility-administered medications for this encounter.     REVIEW OF SYSTEMS: On review of systems, the patient reports that she is feeling much better since surgery. She's getting around well but is still wearing a patch over her left eye. She denies any seizures, dizziness, nausea or vomiting. She has occasional headaches and notes that she has stinging and blurriness still in her left eye. She has had a difficult time in recalling people she's met recently. No other complaints are verbalized.      PHYSICAL EXAM:  Wt Readings from Last 3 Encounters:  03/22/21 160 lb 4 oz (72.7 kg)  03/11/21 159 lb 6.3 oz (72.3 kg)  03/10/21 159 lb 6.4 oz (72.3 kg)   Temp Readings from Last 3 Encounters:  03/22/21 98.6 F (37 C) (Temporal)  03/15/21 97.7 F (36.5 C) (Oral)  03/10/21 97.7 F (36.5 C) (Temporal)   BP Readings from Last 3 Encounters:  03/22/21 127/79  03/15/21 (!) 163/75  03/10/21 113/68   Pulse Readings from Last 3 Encounters:  03/22/21 68  03/15/21 (!) 59  03/10/21 75    In general this is a well appearing caucasian female in no acute distress. She's alert and oriented x4 and appropriate throughout the examination. A staple line is visible along the left scalp. No erythema or separation is noted. Cardiopulmonary assessment is negative for acute distress and she exhibits normal effort.     ECOG = 1  0 - Asymptomatic (Fully active, able to carry on all predisease activities without restriction)  1 - Symptomatic but completely ambulatory (Restricted in physically strenuous activity but ambulatory and able to carry out work of a light  or sedentary nature. For  example, light housework, office work)  2 - Symptomatic, <50% in bed during the day (Ambulatory and capable of all self care but unable to carry out any work activities. Up and about more than 50% of waking hours)  3 - Symptomatic, >50% in bed, but not bedbound (Capable of only limited self-care, confined to bed or chair 50% or more of waking hours)  4 - Bedbound (Completely disabled. Cannot carry on any self-care. Totally confined to bed or chair)  5 - Death   Eustace Pen MM, Creech RH, Tormey DC, et al. [phone]). "Toxicity and response criteria of the [facility]". [place] Oncol. 5 (6): 649-55    LABORATORY DATA:  Lab Results  Component Value Date   WBC 10.8 (H) 03/12/2021   HGB 11.3 (L) 03/12/2021   HCT 33.7 (L) 03/12/2021   MCV 90.3 03/12/2021   PLT 149 (L) 03/12/2021   Lab Results  Component Value Date   NA 136 03/12/2021   K 3.7 03/12/2021   CL 105 03/12/2021   CO2 22 03/12/2021   Lab Results  Component Value Date   ALT 19 02/17/2021   AST 17 02/17/2021   ALKPHOS 57 02/17/2021   BILITOT 0.6 02/17/2021      RADIOGRAPHY: CT Chest W Contrast  Result Date: 03/01/2021 CLINICAL DATA:  Brain lesions noted on recent brain MRI. Evaluate for metastatic disease. History of breast cancer. EXAM: CT CHEST WITH CONTRAST TECHNIQUE: Multidetector CT imaging of the chest was performed during intravenous contrast administration. CONTRAST:  47m OMNIPAQUE IOHEXOL 350 MG/ML SOLN COMPARISON:  CT scan 10/29/2013 FINDINGS: Cardiovascular: The heart is normal in size. No pericardial effusion. There is tortuosity, ectasia and calcification of the thoracic aorta. No dissection. Mediastinum/Nodes: Enlarged mediastinal and right hilar lymph nodes are noted. Right paratracheal node on image number 45/2 measures 13 mm. Lower right paratracheal node on image 54/2 measures 11 mm. Subcarinal node on image number 69/2 measures 11 mm. Right hilar/infrahilar node on image 76/2 measures  12.5 mm. There is a epicardial node just above the left hemidiaphragm on image 139/2 the measures 15 mm. The esophagus is grossly normal. Lungs/Pleura: Peripheral right upper lobe pulmonary lesion on image number 95/5 measures 2.0 x 1.5 cm and is slightly irregular/spiculated margins. This is more worrisome from a primary lung neoplasm then a metastatic lesion from breast cancer. 5 mm subpleural nodule in the right lower lobe is unchanged since the prior CT scan and most likely a subpleural lymph node. 4.5 mm subpleural nodule on image number 85/5 previously measured 3 mm. 2 mm subpleural nodule in the left lower lobe on image number 97/5 appears stable. Upper Abdomen: No significant upper abdominal findings. No evidence of hepatic or adrenal gland metastasis. Stable aortic calcifications. Musculoskeletal: Status post right mastectomy. No findings suspicious for recurrent chest wall tumor, axillary or supraclavicular adenopathy. The left breast is grossly normal by CT. Scoliosis and advanced degenerative changes involving the spine. No obvious lytic or sclerotic bone lesions. IMPRESSION: 1. 2.0 x 1.5 cm peripheral right upper lobe pulmonary lesion most suspicious for primary lung neoplasm. 2. Enlarged mediastinal and right hilar lymph nodes suspicious for metastatic disease. 3. Stable small bilateral pulmonary nodules. 4. No findings for upper abdominal metastatic disease or osseous metastatic disease. 5. PET-CT may be helpful for further evaluation but the right lung lesion may require biopsy. 6. Aortic atherosclerosis. Aortic Atherosclerosis (ICD10-I70.0). Electronically Signed   By: PMarijo SanesM.D.   On:  03/01/2021 16:29   MR BRAIN W WO CONTRAST  Addendum Date: 03/12/2021   ADDENDUM REPORT: 03/12/2021 22:48 ADDENDUM: In addition to the initially described findings, note made of trace susceptibility artifact layering within the occipital horns of both lateral ventricles, consistent with trace  intraventricular hemorrhage, likely related to redistribution. No hydrocephalus. Electronically Signed   By: Jeannine Boga M.D.   On: 03/12/2021 22:48   Result Date: 03/12/2021 CLINICAL DATA:  Follow-up examination status post craniotomy for tumor resection. EXAM: MRI HEAD WITHOUT AND WITH CONTRAST TECHNIQUE: Multiplanar, multiecho pulse sequences of the brain and surrounding structures were obtained without and with intravenous contrast. CONTRAST:  7.80m GADAVIST GADOBUTROL 1 MMOL/ML IV SOLN COMPARISON:  Prior brain MRI from 02/24/2021. FINDINGS: Brain: Postoperative changes from interval left pterional craniotomy are seen. Scattered postoperative pneumocephalus overlies the left greater than right anterior frontal convexities. 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 Small amount of residual enhancement along the posterior/inferior margin of the resection cavity, likely reflecting a small amount of residual tumor (series 18, images 18-13). Persistent dural infiltration with posterior extension to involve the left geniculate ganglion, with additional extension along the labyrinthine segment proximally and facial nerve distally, similar to preoperative exam (series 18, images 13, 11). Mild localized edema within the adjacent left temporal lobe without significant regional mass effect. 7 mm focus of restricted diffusion involving the left frontal cortex consistent with a tiny acute ischemic nonhemorrhagic infarct (series 5, image 84). Additional vague diffusion abnormality along the posterior falx favored to be related to susceptibility artifact. No other evidence for acute or subacute ischemia. Additional metastatic lesions involving the right occipital lobe and right cerebellum again noted, unchanged measuring 1.1 cm in 2.4 cm respectively. Associated vasogenic edema is not significantly changed. No other mass  lesion or abnormal enhancement. Underlying chronic microvascular ischemic disease involving the supratentorial white matter and pons again noted. Vascular: Major intracranial vascular flow voids are maintained. Skull and upper cervical spine: Craniocervical junction within normal limits. Bone marrow signal intensity normal. Post craniotomy changes noted at the left frontotemporal calvarium and scalp. Sinuses/Orbits: Prior bilateral ocular lens replacement. Scattered mucosal thickening within the ethmoidal air cells. Mastoid air cells remain clear. Other: None. IMPRESSION: 1. Postoperative changes from interval left pterional craniotomy for tumor resection. Small amount of residual enhancement along the posterior/inferior margin of the resection cavity likely reflects a small amount of residual tumor, with similar dural infiltration and extension to involve the geniculate ganglion and seventh cranial nerve as above. 2. 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 3. Additional metastatic lesions involving the right occipital lobe and right cerebellum, unchanged. 4. Underlying chronic microvascular ischemic disease. Electronically Signed: By: BJeannine BogaM.D. On: 03/12/2021 22:27   MR BRAIN W WO CONTRAST  Result Date: 02/26/2021 CLINICAL DATA:  History of breast cancer.  Preop. EXAM: MRI HEAD WITHOUT AND WITH CONTRAST TECHNIQUE: Multiplanar, multiecho pulse sequences of the brain and surrounding structures were obtained without and with intravenous contrast. CONTRAST:  744mGADAVIST GADOBUTROL 1 MMOL/ML IV SOLN COMPARISON:  02/10/2021 FINDINGS: Brain: 3 enhancing brain lesions are again identified: *2.4 cm in the lateral left cerebellum. *3.2 cm in the inferior left temporal lobe *1.1 cm in the right occipital temporal cortical region. Masses all have necrotic features with mild adjacent edema. The left temporal lobe mass is notable for inferior dural infiltration and extension along  the facial nerve at the level of the  geniculate ganglion. Proximally there is extension into the deep canalicular segment and distally both the horizontal and vertical segments appear asymmetrically enhancing, reaching the stylomastoid foramina. This correlates with history of facial weakness the left. Chronic small vessel ischemia in the hemispheric white matter. Age normal brain volume. No hydrocephalus or collection Vascular: Normal flow voids and vascular enhancements. Skull and upper cervical spine: Cervical spine degeneration. No evident bone lesion Sinuses/Orbits: Bilateral cataract resection. 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 The left temporal metastasis is notable for inferior dural infiltration with tracking along the left facial nerve, extending both directions from the geniculate ganglion. Electronically Signed   By: Monte Fantasia M.D.   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 EXAM: NUCLEAR MEDICINE WHOLE BODY BONE SCAN TECHNIQUE: Whole body anterior and posterior images were obtained approximately 3 hours after intravenous injection of radiopharmaceutical. RADIOPHARMACEUTICALS:  69.4 millicurie mCi [id number] MDP IV COMPARISON:  CT scan of the chest 02/28/2021 FINDINGS: Focal uptake in the distal femur and proximal tibia corresponding with the lateral compartment of the right knee. Additional small foci of increased radiotracer uptake noted in the bilateral wrists, ankles, and shoulders consistent with degenerative uptake. The patient has fairly significant dextroconvex scoliosis of the thoracolumbar spine. Multiple small foci of increased radiotracer uptake throughout the spine corresponding with areas of degenerative change. No focal abnormal increased uptake suggestive of metastatic disease. Normal urinary excretion. IMPRESSION: 1. Negative for osseous  metastatic disease. 2. Focal uptake in the lateral compartment of the right knee suggests underlying osteoarthritis. 3. Dextroconvex scoliosis of the thoracolumbar spine. Electronically Signed   By: Jacqulynn Cadet M.D.   On: 03/06/2021 11:02   DG Foot 2 Views Left  Result Date: 03/09/2021 Please see detailed radiograph report in office note.      IMPRESSION/PLAN: 1. 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 Dr. Lisbeth Renshaw discusses the pathology findings and reviews the nature of metastatic disease to the brain. After reviewing her case in conference, given the concerns for leptomeningeal disease, she would be best suited with whole brain radiation. We discussed the possibility of stereotactic radiosurgery in the future if salvage was needed.   We discussed the risks, benefits, short, and long term effects of radiotherapy, as well as the palliative intent, and the patient is interested in proceeding. Dr. Lisbeth Renshaw discusses the delivery and logistics of radiotherapy and anticipates a course of 10 fractions to the whole brain. Written consent is obtained and placed in the chart, a copy was provided to the patient. She will simulate on Friday. 2. Risks of cognitive impairment from whole brain radiation. Dr. Lisbeth Renshaw recommends treating with namenda during and following treatment as is per NCCN guidelines in other scenarios for whole brain radiation. The patient is interested an both prescriptions for namenda were called to her pharmacy.  3. Remote Stage IIB, triple negative invasive ductal carcinoma of the right breast. She will be followed expectantly along with medical oncology as she pursues treatment for #1.   In a visit lasting 60 minutes, greater than 50% of the time was spent face to face discussing the patient's condition, in preparation for the discussion, and coordinating the patient's care.   The above documentation reflects my direct findings during this shared patient visit.  Please see the separate note by Dr. Lisbeth Renshaw on this date for the remainder of the patient's plan of care.  Carola Rhine, [facility]   **Disclaimer: This note was dictated with voice recognition software. Similar sounding words can inadvertently be transcribed and this note may contain transcription errors which may not have been corrected upon publication of note.**

## 2021-03-23 ENCOUNTER — Encounter: Payer: Self-pay | Admitting: *Deleted

## 2021-03-23 ENCOUNTER — Encounter: Payer: Self-pay | Admitting: Licensed Clinical Social Worker

## 2021-03-23 DIAGNOSIS — Z8673 Personal history of transient ischemic attack (TIA), and cerebral infarction without residual deficits: Secondary | ICD-10-CM | POA: Diagnosis not present

## 2021-03-23 DIAGNOSIS — C50919 Malignant neoplasm of unspecified site of unspecified female breast: Secondary | ICD-10-CM | POA: Diagnosis not present

## 2021-03-23 DIAGNOSIS — Z7952 Long term (current) use of systemic steroids: Secondary | ICD-10-CM | POA: Diagnosis not present

## 2021-03-23 DIAGNOSIS — Z7902 Long term (current) use of antithrombotics/antiplatelets: Secondary | ICD-10-CM | POA: Diagnosis not present

## 2021-03-23 DIAGNOSIS — Z483 Aftercare following surgery for neoplasm: Secondary | ICD-10-CM | POA: Diagnosis not present

## 2021-03-23 DIAGNOSIS — Z9181 History of falling: Secondary | ICD-10-CM | POA: Diagnosis not present

## 2021-03-23 NOTE — Progress Notes (Signed)
I reached out to pathology to see if molecular testing have been sent on Debra Mcintyre recent bx. Wait for response.

## 2021-03-23 NOTE — Progress Notes (Signed)
[facility] requested on recent bx for molecular and PDL 1 testing.

## 2021-03-23 NOTE — Progress Notes (Signed)
[facility] Psychosocial Distress Screening Clinical Social Work  Clinical Social Work was referred by distress screening protocol.  The patient scored a 5 on the Psychosocial Distress Thermometer which indicates moderate distress. Clinical Social Worker contacted patient by phone to assess for distress and other psychosocial needs.   Patient reports feeling that she is doing well overall right now. She is waiting to find out the schedule for her radiation treatments after her appts on Friday.  Transportation is the only concern as she & her husband are not driving and are currently relying on their sons when they visit or on neighbors. CSW explained transportation program and sent referral with patient's permission. CSW explained other support services and provided direct contact information for CSW's who work with Dr. Worthy Flank pts as he will be the new primary oncologist.  [facility] DISTRESS SCREENING 03/22/2021  Screening Type Initial Screening  Distress experienced in past week (1-10) 5  Practical problem type Housing;Transportation;Food  Emotional problem type Adjusting to appearance changes  Information Concerns Type Lack of info about diagnosis;Lack of info about treatment;Lack of info about complementary therapy choices;Lack of info about maintaining fitness  Other Contact via Phone    Clinical Social Worker follow up needed: No.  If yes, follow up plan:  Zionah Criswell E Chong Wojdyla, LCSW

## 2021-03-24 ENCOUNTER — Other Ambulatory Visit: Payer: Self-pay | Admitting: Internal Medicine

## 2021-03-24 ENCOUNTER — Other Ambulatory Visit: Payer: Self-pay | Admitting: *Deleted

## 2021-03-24 DIAGNOSIS — Z7952 Long term (current) use of systemic steroids: Secondary | ICD-10-CM | POA: Diagnosis not present

## 2021-03-24 DIAGNOSIS — Z483 Aftercare following surgery for neoplasm: Secondary | ICD-10-CM | POA: Diagnosis not present

## 2021-03-24 DIAGNOSIS — Z7902 Long term (current) use of antithrombotics/antiplatelets: Secondary | ICD-10-CM | POA: Diagnosis not present

## 2021-03-24 DIAGNOSIS — C50411 Malignant neoplasm of upper-outer quadrant of right female breast: Secondary | ICD-10-CM

## 2021-03-24 DIAGNOSIS — Z9181 History of falling: Secondary | ICD-10-CM | POA: Diagnosis not present

## 2021-03-24 DIAGNOSIS — Z8673 Personal history of transient ischemic attack (TIA), and cerebral infarction without residual deficits: Secondary | ICD-10-CM | POA: Diagnosis not present

## 2021-03-24 DIAGNOSIS — Z171 Estrogen receptor negative status [ER-]: Secondary | ICD-10-CM

## 2021-03-24 DIAGNOSIS — C50919 Malignant neoplasm of unspecified site of unspecified female breast: Secondary | ICD-10-CM | POA: Diagnosis not present

## 2021-03-24 NOTE — Progress Notes (Signed)
The proposed treatment discussed in cancer conference is for discussion purpose only and is not a binding recommendation. The patient was not physically examined nor present for their treatment options. Therefore, final treatment plans cannot be decided.  ?

## 2021-03-25 ENCOUNTER — Other Ambulatory Visit: Payer: Self-pay

## 2021-03-25 ENCOUNTER — Telehealth: Payer: Self-pay | Admitting: Internal Medicine

## 2021-03-25 ENCOUNTER — Encounter: Payer: Self-pay | Admitting: Internal Medicine

## 2021-03-25 ENCOUNTER — Encounter: Payer: Self-pay | Admitting: *Deleted

## 2021-03-25 ENCOUNTER — Inpatient Hospital Stay: Payer: Medicare Other

## 2021-03-25 ENCOUNTER — Inpatient Hospital Stay (HOSPITAL_BASED_OUTPATIENT_CLINIC_OR_DEPARTMENT_OTHER): Payer: Medicare Other | Admitting: Internal Medicine

## 2021-03-25 ENCOUNTER — Ambulatory Visit
Admission: RE | Admit: 2021-03-25 | Discharge: 2021-03-25 | Disposition: A | Discharge status: Home / Self Care | Payer: Medicare Other | Source: Ambulatory Visit | Attending: Radiation Oncology | Admitting: Radiation Oncology

## 2021-03-25 VITALS — BP 137/78 | HR 74 | Temp 97.0°F | Resp 20 | Ht 69.0 in | Wt 157.6 lb

## 2021-03-25 DIAGNOSIS — Z5111 Encounter for antineoplastic chemotherapy: Secondary | ICD-10-CM

## 2021-03-25 DIAGNOSIS — Z79899 Other long term (current) drug therapy: Secondary | ICD-10-CM | POA: Diagnosis not present

## 2021-03-25 DIAGNOSIS — C50411 Malignant neoplasm of upper-outer quadrant of right female breast: Secondary | ICD-10-CM | POA: Diagnosis not present

## 2021-03-25 DIAGNOSIS — Z923 Personal history of irradiation: Secondary | ICD-10-CM | POA: Insufficient documentation

## 2021-03-25 DIAGNOSIS — C7931 Secondary malignant neoplasm of brain: Secondary | ICD-10-CM | POA: Diagnosis not present

## 2021-03-25 DIAGNOSIS — Z171 Estrogen receptor negative status [ER-]: Secondary | ICD-10-CM | POA: Diagnosis not present

## 2021-03-25 DIAGNOSIS — Z9221 Personal history of antineoplastic chemotherapy: Secondary | ICD-10-CM | POA: Insufficient documentation

## 2021-03-25 DIAGNOSIS — C3491 Malignant neoplasm of unspecified part of right bronchus or lung: Secondary | ICD-10-CM | POA: Diagnosis not present

## 2021-03-25 DIAGNOSIS — C3411 Malignant neoplasm of upper lobe, right bronchus or lung: Secondary | ICD-10-CM | POA: Insufficient documentation

## 2021-03-25 DIAGNOSIS — Z510 Encounter for antineoplastic radiation therapy: Secondary | ICD-10-CM | POA: Diagnosis not present

## 2021-03-25 DIAGNOSIS — C3490 Malignant neoplasm of unspecified part of unspecified bronchus or lung: Secondary | ICD-10-CM

## 2021-03-25 DIAGNOSIS — Z87891 Personal history of nicotine dependence: Secondary | ICD-10-CM | POA: Diagnosis not present

## 2021-03-25 LAB — CMP (CANCER CENTER ONLY)
ALT: 30 U/L (ref 0–44)
AST: 16 U/L (ref 15–41)
Albumin: 3.6 g/dL (ref 3.5–5.0)
Alkaline Phosphatase: 44 U/L (ref 38–126)
Anion gap: 13 (ref 5–15)
BUN: 20 mg/dL (ref 8–23)
CO2: 23 mmol/L (ref 22–32)
Calcium: 9.7 mg/dL (ref 8.9–10.3)
Chloride: 102 mmol/L (ref 98–111)
Creatinine: 0.81 mg/dL (ref 0.44–1.00)
GFR, Estimated: >60 mL/min (ref >60)
Glucose, Bld: 136 mg/dL — ABNORMAL HIGH (ref 70–99)
Potassium: 3.9 mmol/L (ref 3.5–5.1)
Sodium: 138 mmol/L (ref 135–145)
Total Bilirubin: 1 mg/dL (ref 0.3–1.2)
Total Protein: 6.8 g/dL (ref 6.5–8.1)

## 2021-03-25 LAB — CBC WITH DIFFERENTIAL (CANCER CENTER ONLY)
Abs Immature Granulocytes: 0.53 10*3/uL — ABNORMAL HIGH (ref 0.00–0.07)
Basophils Absolute: 0.1 10*3/uL (ref 0.0–0.1)
Basophils Relative: 0 %
Eosinophils Absolute: 0 10*3/uL (ref 0.0–0.5)
Eosinophils Relative: 0 %
HCT: 41.2 % (ref 36.0–46.0)
Hemoglobin: 14 g/dL (ref 12.0–15.0)
Immature Granulocytes: 2 %
Lymphocytes Relative: 7 %
Lymphs Abs: 1.6 10*3/uL (ref 0.7–4.0)
MCH: 29.7 pg (ref 26.0–34.0)
MCHC: 34 g/dL (ref 30.0–36.0)
MCV: 87.5 fL (ref 80.0–100.0)
Monocytes Absolute: 1.1 10*3/uL — ABNORMAL HIGH (ref 0.1–1.0)
Monocytes Relative: 5 %
Neutro Abs: 18.7 10*3/uL — ABNORMAL HIGH (ref 1.7–7.7)
Neutrophils Relative %: 86 %
Platelet Count: 241 10*3/uL (ref 150–400)
RBC: 4.71 MIL/uL (ref 3.87–5.11)
RDW: 14.4 % (ref 11.5–15.5)
WBC Count: 22.1 10*3/uL — ABNORMAL HIGH (ref 4.0–10.5)
nRBC: 0 % (ref 0.0–0.2)

## 2021-03-25 NOTE — Progress Notes (Signed)
I received a message from pathology that [name] tissue  has [facility], Dr. Julien Nordmann aware.

## 2021-03-25 NOTE — Progress Notes (Signed)
Oncology Nurse Navigator Documentation  Oncology Nurse Navigator Flowsheets 03/25/2021  Abnormal Finding Date 02/10/2021  Confirmed Diagnosis Date 03/11/2021  Diagnosis Status Pending Molecular Studies  Surgery Actual Start Date: 03/11/2021  Navigator Follow Up Date: 03/29/2021  Navigator Follow Up Reason: Appointment Review  Navigator Location [facility]  Navigator Encounter Type Clinic/MDC  Treatment Initiated Date 03/11/2021  Patient Visit Type Initial;MedOnc  Treatment Phase Pre-Tx/Tx Discussion  Barriers/Navigation Needs Coordination of Care;Education  Education Newly Diagnosed Cancer Education;Concerns with Finances/ Eligibility;Coping with Diagnosis/ Prognosis;Transport During Treatment;Other  Interventions Coordination of Care;Education;Psycho-Social Support;Referrals  Acuity Level 3-Moderate Needs (3-4 Barriers Identified)  Referrals Social Work;Other  Coordination of Care Other  Education Method Verbal;Written  Time Spent with Patient 39

## 2021-03-25 NOTE — Telephone Encounter (Signed)
   Debra Mcintyre DOB: 05/12/1941 MRN: [id number]   RIDER WAIVER AND RELEASE OF LIABILITY  For purposes of improving physical access to our facilities, [facility] is pleased to partner with third parties to provide [place] patients or other authorized individuals the option of convenient, on-demand ground transportation services (the [employer]") through use of the technology service that enables users to request on-demand ground transportation from independent third-party providers.  By opting to use and accept these [employer], I, the undersigned, hereby agree on behalf of myself, and on behalf of any minor child using the Government social research officer for whom I am the parent or legal guardian, as follows:  Government social research officer provided to me are provided by independent third-party transportation providers who are not [employer] or employees and who are unaffiliated with [employer]. [facility] is neither a transportation carrier nor a common or public carrier. [facility] has no control over the quality or safety of the transportation that occurs as a result of the [employer]. [facility] cannot guarantee that any third-party transportation provider will complete any arranged transportation service. [facility] makes no representation, warranty, or guarantee regarding the reliability, timeliness, quality, safety, suitability, or availability of any of the Transport Services or that they will be error free. I fully understand that traveling by vehicle involves risks and dangers of serious bodily injury, including permanent disability, paralysis, and death. I agree, on behalf of myself and on behalf of any minor child using the Transport Services for whom I am the parent or legal guardian, that the entire risk arising out of my use of the [employer] remains solely with me, to the maximum extent permitted under applicable law. The [employer] are provided  "as is" and "as available." [facility] disclaims all representations and warranties, express, implied or statutory, not expressly set out in these terms, including the implied warranties of merchantability and fitness for a particular purpose. I hereby waive and release [facility], its agents, employees, officers, directors, representatives, insurers, attorneys, assigns, successors, subsidiaries, and affiliates from any and all past, present, or future claims, demands, liabilities, actions, causes of action, or suits of any kind directly or indirectly arising from acceptance and use of the [employer]. I further waive and release  and its affiliates from all present and future liability and responsibility for any injury or death to persons or damages to property caused by or related to the use of the [employer]. I have read this Waiver and Release of Liability, and I understand the terms used in it and their legal significance. This Waiver is freely and voluntarily given with the understanding that my right (as well as the right of any minor child for whom I am the parent or legal guardian using the [employer]) to legal recourse against  in connection with the [employer] is knowingly surrendered in return for use of these services.   I attest that I read the consent document to Debra Mcintyre, gave Debra Mcintyre the opportunity to ask questions and answered the questions asked (if any). I affirm that Debra Mcintyre then provided consent for she's participation in this program.     Debra Mcintyre

## 2021-03-25 NOTE — Progress Notes (Signed)
[place] [phone]   [phone]  CONSULT NOTE  REFERRING PHYSICIAN: Dr. Georgia Dom Magrinat  REASON FOR CONSULTATION:  80 years old white female with metastatic lung cancer.  HPI Debra Mcintyre is a 80 y.o. female with past medical history significant for anemia, cerebral aneurysm, history of stage IIb breast cancer involving the right breast and right axilla diagnosed and August 2013 and biopsy of the large axillary lymph node on October 07, 2012 showed invasive ductal carcinoma, triple negative.  The patient was treated with neoadjuvant systemic chemotherapy with doxorubicin, cyclophosphamide and paclitaxel followed by right mastectomy and adjuvant radiotherapy completed August 13, 2013.  She was under the care of [name] during that time and has been followed by observation with no additional treatment since her adjuvant radiotherapy. Most recently the patient presented with headache, facial asymmetry and left upper eyelid weakness.  She had MRI of the brain on February 10, 2021 and that showed 3 enhancing mass lesion in the brain compatible with metastatic disease in the right cerebellum, left temporal lobe and right lateral occipital lobe with mild surrounding edema.  The patient is started on treatment with Decadron and referred to radiation oncologist and neurosurgery.  She had CT scan of the chest performed on February 28, 2021 and it showed 2.0 x 1.5 cm peripheral right upper lobe pulmonary lesion most suspicious for primary lung neoplasm.  There was also enlarged mediastinal and right hilar lymph nodes suspicious for metastatic disease.  On March 11, 2021 the patient underwent left temporal tumor excision by neurosurgery and the final pathology ([id number]) showed adenocarcinoma consistent with metastasis from lung primary. The tumor is strongly and diffusely positive for TTF-1 and Napsin A immunostaining.  It is negative for CDX2, cytokeratin 20,  ER, and GATA3. It faintly stains for PAX 8, which is unusual but likely nonspecific.  The patient was referred to me today for evaluation and recommendation regarding treatment of her condition. When seen today she feels fine except for the facial droop and aching pain on the left side of the face.  She denied having any current chest pain, shortness of breath, cough or hemoptysis.  She denied having any nausea, vomiting, diarrhea or constipation.  She has no headache or visual changes.  She has no significant weight loss or night sweats. Family history significant for father died from old age.  Mother had breast cancer and died in her 33s and she also had a brother with cancer. The patient is married and has 2 stepchildren.  She was accompanied today by her son Debra Mcintyre.  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  She has a history of smoking for around 20 years but quit 4 years ago.  She has no history of alcohol or drug abuse.  HPI  Past Medical History:  Diagnosis Date   Anemia    Arthritis    Breast cancer ([place]) 09/2012   right breast/right axillary lymph node t2,pn1, stage 11b, invasive ductal carcinma, grade 3, triple negative, with an mib-1 of 15%   Cataract    bilateral removed   Cerebral aneurysm, nonruptured    Cerebrovascular disease, unspecified    Colonic polyp    Dizziness    pt. states that she experiences this occassionally 05/03/15   Headache    occassionally   Heart murmur    History of chemotherapy    Hx of radiation therapy 05/27/13-08/13/13   right chest wall/right supraclavicular/axillary region  5220 cGy 29 sessions, right mastectomy/chest wall boost cGy 5 sessions   Hypertension    Does not see a cardiologist   Hypothyroidism    Inguinal hernia    Neuropathy    Pure hypercholesterolemia    Rosacea    Seasonal allergies    Stroke ([place]) 2006   no deficits - TIA   TIA (transient ischemic attack)    Wears glasses     Past Surgical History:   Procedure Laterality Date   APPLICATION OF CRANIAL NAVIGATION Left 03/11/2021   Procedure: APPLICATION OF CRANIAL NAVIGATION;  Surgeon: Karsten Ro, DO;  Location: [facility];  Service: Neurosurgery;  Laterality: Left;   BREAST BIOPSY Right 10/23/2012   BREAST BIOPSY Right 10/08/2012   BREAST BIOPSY Left 08/13/2019   REACTIVE LYMPH NODE WITH FOLLICULAR HYPERPLASIA   COLONOSCOPY     COLONOSCOPY W/ POLYPECTOMY     CRANIOTOMY Left 03/11/2021   Procedure: LEFT FRONTOTEMPORAL CRANIOTOMY FOR RESECTION OF METASTATIC LESION;  Surgeon: Karsten Ro, DO;  Location: [facility];  Service: Neurosurgery;  Laterality: Left;   ESOPHAGOGASTRODUODENOSCOPY     Left middle cerebral artery angioplasty  2004   by [name] had 2 follow up [place] Right 04/23/2013   Procedure: RIGHT TOTAL MASTECTOMY WITH SENTINEL LYMPH NODE BIOPSY;  Surgeon: [name], MD;  Location: [facility];  Service: General;  Laterality: Right;   PORTACATH PLACEMENT N/A 11/05/2012   Procedure: INSERTION PORT-A-CATH;  Surgeon: [name], MD;  Location: [facility];  Service: General;  Laterality: N/A;   RADIOLOGY WITH ANESTHESIA N/A 05/04/2015   Procedure: MRI BRAIN WITH AND WITHOUT;  Surgeon: Medication Radiologist, MD;  Location: [facility];  Service: Radiology;  Laterality: N/A;    Family History  Problem Relation Age of Onset   Lung cancer Mother 47   Heart disease Father    Colon cancer Brother 53   Alcohol abuse Brother    Colon cancer Maternal Uncle        dx in his 59s   Colon cancer Maternal Aunt    Ovarian cancer Other 47   Lung cancer Maternal Aunt    Breast cancer Sister    Rectal cancer Neg Hx    Stomach cancer Neg Hx    Esophageal cancer Neg Hx     Social History Social History   Tobacco Use   Smoking status: Former    Packs/day: 1.00    Years: 20.00    Pack years: 20.00    Types: Cigarettes    Quit date: 04/02/1986    Years since  quitting: 35.0   Smokeless tobacco: Never  Vaping Use   Vaping Use: Never used  Substance Use Topics   Alcohol use: No   Drug use: No    No Known Allergies  Current Outpatient Medications  Medication Sig Dispense Refill   Calcium Carbonate (CALTRATE 600 PO) Take 1 tablet by mouth in the morning and at bedtime.     clopidogrel (PLAVIX) 75 MG tablet Take 1 tablet (75 mg total) by mouth daily. 90 tablet 1   dexamethasone (DECADRON) 4 MG tablet Take 1 tablet (4 mg total) by mouth every 12 (twelve) hours. 60 tablet 1   gabapentin (NEURONTIN) 300 MG capsule Take 1 capsule (300 mg total) by mouth at bedtime. 90 capsule 4   Glycerin-Polysorbate 80 (REFRESH DRY EYE THERAPY OP) Place 1 drop into both eyes daily as needed (Dry eye).  HYDROcodone-acetaminophen (NORCO/VICODIN) 5-325 MG tablet Take 1 tablet by mouth every 4 (four) hours as needed for moderate pain. 60 tablet 0   levETIRAcetam (KEPPRA) 500 MG tablet Take 1 tablet (500 mg total) by mouth 2 (two) times daily. 60 tablet 1   levothyroxine (SYNTHROID) 137 MCG tablet TAKE 1 TABLET(137 MCG) BY MOUTH DAILY (Patient taking differently: Take 137 mcg by mouth daily before breakfast.) 90 tablet 1   lisinopril (ZESTRIL) 20 MG tablet TAKE 1 TABLET(20 MG) BY MOUTH DAILY (Patient taking differently: Take 10 mg by mouth at bedtime.) 90 tablet 1   memantine (NAMENDA) 10 MG tablet Take 1 tablet (10 mg total) by mouth 2 (two) times daily. 60 tablet 4   memantine (NAMENDA) 5 MG tablet Begin this prescription the first day of brain radiation. Week 1: take one tablet po qam. Week 2: take one tablet qam and qpm. Week 3: take two tablets qam, and one tablet po q pm. Week 4: take two tablets qam and qpm. Fill subsequent prescription q month. 70 tablet 0   metoprolol succinate (TOPROL-XL) 100 MG 24 hr tablet TAKE 1 TABLET BY MOUTH EVERY DAY WITH OR IMMEDIATELY FOLLOWING A MEAL (Patient taking differently: Take 50 mg by mouth at bedtime.) 90 tablet 1   Multiple  Vitamins-Minerals (CENTRUM SILVER ULTRA WOMENS) TABS Take 1 tablet by mouth at bedtime.     psyllium (METAMUCIL) 58.6 % powder Take 1 packet by mouth daily as needed (Constipation).     rosuvastatin (CRESTOR) 20 MG tablet TAKE 1 TABLET BY MOUTH DAILY (Patient taking differently: Take 20 mg by mouth at bedtime.) 90 tablet 0   sertraline (ZOLOFT) 100 MG tablet TAKE 1 TABLET(100 MG) BY MOUTH DAILY (Patient taking differently: Take 100 mg by mouth in the morning.) 90 tablet 1   No current facility-administered medications for this visit.    Review of Systems  Constitutional: positive for fatigue Eyes: positive for visual disturbance Ears, nose, mouth, throat, and face: positive for left facial droop Respiratory: negative Cardiovascular: negative Gastrointestinal: negative Genitourinary:negative Integument/breast: negative Hematologic/lymphatic: negative Musculoskeletal:negative Neurological: negative Behavioral/Psych: negative Endocrine: negative Allergic/Immunologic: negative  Physical Exam  SJG:GEZMO, healthy, no distress, well nourished, and well developed SKIN: skin color, texture, turgor are normal, no rashes or significant lesions HEAD: Normocephalic, No masses, lesions, tenderness or abnormalities EYES: normal, PERRLA, Conjunctiva are pink and non-injected EARS: External ears normal, Canals clear OROPHARYNX:no exudate, no erythema, and lips, buccal mucosa, and tongue normal  NECK: supple, no adenopathy, no JVD LYMPH:  no palpable lymphadenopathy, no hepatosplenomegaly BREAST:not examined LUNGS: clear to auscultation , and palpation HEART: regular rate & rhythm, no murmurs, and no gallops ABDOMEN:abdomen soft, non-tender, normal bowel sounds, and no masses or organomegaly BACK: Back symmetric, no curvature., No CVA tenderness EXTREMITIES:no joint deformities, effusion, or inflammation, no edema  NEURO: alert & oriented x 3 with fluent speech, no focal motor/sensory  deficits  PERFORMANCE STATUS: ECOG 1  LABORATORY DATA: Lab Results  Component Value Date   WBC 10.8 (H) 03/12/2021   HGB 11.3 (L) 03/12/2021   HCT 33.7 (L) 03/12/2021   MCV 90.3 03/12/2021   PLT 149 (L) 03/12/2021      Chemistry      Component Value Date/Time   NA 136 03/12/2021 0500   NA 141 03/21/2017 1105   K 3.7 03/12/2021 0500   K 4.3 03/21/2017 1105   CL 105 03/12/2021 0500   CL 102 01/28/2013 0909   CO2 22 03/12/2021 0500   CO2 25 03/21/2017  1105   BUN 12 03/12/2021 0500   BUN 12.5 03/21/2017 1105   CREATININE 0.71 03/12/2021 0500   CREATININE 0.78 02/17/2021 1529   CREATININE 0.79 06/30/2020 1446   CREATININE 0.7 03/21/2017 1105      Component Value Date/Time   CALCIUM 8.5 (L) 03/12/2021 0500   CALCIUM 9.8 03/21/2017 1105   ALKPHOS 57 02/17/2021 1529   ALKPHOS 48 03/21/2017 1105   AST 17 02/17/2021 1529   AST 21 03/21/2017 1105   ALT 19 02/17/2021 1529   ALT 21 03/21/2017 1105   BILITOT 0.6 02/17/2021 1529   BILITOT 0.86 03/21/2017 1105       RADIOGRAPHIC STUDIES: CT Chest W Contrast  Result Date: 03/01/2021 CLINICAL DATA:  Brain lesions noted on recent brain MRI. Evaluate for metastatic disease. History of breast cancer. EXAM: CT CHEST WITH CONTRAST TECHNIQUE: Multidetector CT imaging of the chest was performed during intravenous contrast administration. CONTRAST:  58m OMNIPAQUE IOHEXOL 350 MG/ML SOLN COMPARISON:  CT scan 10/29/2013 FINDINGS: Cardiovascular: The heart is normal in size. No pericardial effusion. There is tortuosity, ectasia and calcification of the thoracic aorta. No dissection. Mediastinum/Nodes: Enlarged mediastinal and right hilar lymph nodes are noted. Right paratracheal node on image number 45/2 measures 13 mm. Lower right paratracheal node on image 54/2 measures 11 mm. Subcarinal node on image number 69/2 measures 11 mm. Right hilar/infrahilar node on image 76/2 measures 12.5 mm. There is a epicardial node just above the left  hemidiaphragm on image 139/2 the measures 15 mm. The esophagus is grossly normal. Lungs/Pleura: Peripheral right upper lobe pulmonary lesion on image number 95/5 measures 2.0 x 1.5 cm and is slightly irregular/spiculated margins. This is more worrisome from a primary lung neoplasm then a metastatic lesion from breast cancer. 5 mm subpleural nodule in the right lower lobe is unchanged since the prior CT scan and most likely a subpleural lymph node. 4.5 mm subpleural nodule on image number 85/5 previously measured 3 mm. 2 mm subpleural nodule in the left lower lobe on image number 97/5 appears stable. Upper Abdomen: No significant upper abdominal findings. No evidence of hepatic or adrenal gland metastasis. Stable aortic calcifications. Musculoskeletal: Status post right mastectomy. No findings suspicious for recurrent chest wall tumor, axillary or supraclavicular adenopathy. The left breast is grossly normal by CT. Scoliosis and advanced degenerative changes involving the spine. No obvious lytic or sclerotic bone lesions. IMPRESSION: 1. 2.0 x 1.5 cm peripheral right upper lobe pulmonary lesion most suspicious for primary lung neoplasm. 2. Enlarged mediastinal and right hilar lymph nodes suspicious for metastatic disease. 3. Stable small bilateral pulmonary nodules. 4. No findings for upper abdominal metastatic disease or osseous metastatic disease. 5. PET-CT may be helpful for further evaluation but the right lung lesion may require biopsy. 6. Aortic atherosclerosis. Aortic Atherosclerosis (ICD10-I70.0). Electronically Signed   By: PMarijo SanesM.D.   On: 03/01/2021 16:29   MR BRAIN W WO CONTRAST  Addendum Date: 03/12/2021   ADDENDUM REPORT: 03/12/2021 22:48 ADDENDUM: In addition to the initially described findings, note made of trace susceptibility artifact layering within the occipital horns of both lateral ventricles, consistent with trace intraventricular hemorrhage, likely related to redistribution. No  hydrocephalus. Electronically Signed   By: BJeannine BogaM.D.   On: 03/12/2021 22:48   Result Date: 03/12/2021 CLINICAL DATA:  Follow-up examination status post craniotomy for tumor resection. EXAM: MRI HEAD WITHOUT AND WITH CONTRAST TECHNIQUE: Multiplanar, multiecho pulse sequences of the brain and surrounding structures were obtained without and with intravenous  contrast. CONTRAST:  7.70m GADAVIST GADOBUTROL 1 MMOL/ML IV SOLN COMPARISON:  Prior brain MRI from 02/24/2021. FINDINGS: Brain: Postoperative changes from interval left pterional craniotomy are seen. Scattered postoperative pneumocephalus overlies the left greater than right anterior frontal convexities. 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 Small amount of residual enhancement along the posterior/inferior margin of the resection cavity, likely reflecting a small amount of residual tumor (series 18, images 18-13). Persistent dural infiltration with posterior extension to involve the left geniculate ganglion, with additional extension along the labyrinthine segment proximally and facial nerve distally, similar to preoperative exam (series 18, images 13, 11). Mild localized edema within the adjacent left temporal lobe without significant regional mass effect. 7 mm focus of restricted diffusion involving the left frontal cortex consistent with a tiny acute ischemic nonhemorrhagic infarct (series 5, image 84). Additional vague diffusion abnormality along the posterior falx favored to be related to susceptibility artifact. No other evidence for acute or subacute ischemia. Additional metastatic lesions involving the right occipital lobe and right cerebellum again noted, unchanged measuring 1.1 cm in 2.4 cm respectively. Associated vasogenic edema is not significantly changed. No other mass lesion or abnormal enhancement. Underlying chronic microvascular  ischemic disease involving the supratentorial white matter and pons again noted. Vascular: Major intracranial vascular flow voids are maintained. Skull and upper cervical spine: Craniocervical junction within normal limits. Bone marrow signal intensity normal. Post craniotomy changes noted at the left frontotemporal calvarium and scalp. Sinuses/Orbits: Prior bilateral ocular lens replacement. Scattered mucosal thickening within the ethmoidal air cells. Mastoid air cells remain clear. Other: None. IMPRESSION: 1. Postoperative changes from interval left pterional craniotomy for tumor resection. Small amount of residual enhancement along the posterior/inferior margin of the resection cavity likely reflects a small amount of residual tumor, with similar dural infiltration and extension to involve the geniculate ganglion and seventh cranial nerve as above. 2. 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 3. Additional metastatic lesions involving the right occipital lobe and right cerebellum, unchanged. 4. Underlying chronic microvascular ischemic disease. Electronically Signed: By: BJeannine BogaM.D. On: 03/12/2021 22:27   MR BRAIN W WO CONTRAST  Result Date: 02/26/2021 CLINICAL DATA:  History of breast cancer.  Preop. EXAM: MRI HEAD WITHOUT AND WITH CONTRAST TECHNIQUE: Multiplanar, multiecho pulse sequences of the brain and surrounding structures were obtained without and with intravenous contrast. CONTRAST:  770mGADAVIST GADOBUTROL 1 MMOL/ML IV SOLN COMPARISON:  02/10/2021 FINDINGS: Brain: 3 enhancing brain lesions are again identified: *2.4 cm in the lateral left cerebellum. *3.2 cm in the inferior left temporal lobe *1.1 cm in the right occipital temporal cortical region. Masses all have necrotic features with mild adjacent edema. The left temporal lobe mass is notable for inferior dural infiltration and extension along the facial nerve at the level of the geniculate ganglion.  Proximally there is extension into the deep canalicular segment and distally both the horizontal and vertical segments appear asymmetrically enhancing, reaching the stylomastoid foramina. This correlates with history of facial weakness the left. Chronic small vessel ischemia in the hemispheric white matter. Age normal brain volume. No hydrocephalus or collection Vascular: Normal flow voids and vascular enhancements. Skull and upper cervical spine: Cervical spine degeneration. No evident bone lesion Sinuses/Orbits: Bilateral cataract resection. 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 The left temporal metastasis is notable for inferior dural infiltration with tracking along the left facial nerve, extending both directions from the geniculate ganglion. Electronically Signed  By: Monte Fantasia M.D.   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 EXAM: NUCLEAR MEDICINE WHOLE BODY BONE SCAN TECHNIQUE: Whole body anterior and posterior images were obtained approximately 3 hours after intravenous injection of radiopharmaceutical. RADIOPHARMACEUTICALS:  32.9 millicurie mCi [id number] MDP IV COMPARISON:  CT scan of the chest 02/28/2021 FINDINGS: Focal uptake in the distal femur and proximal tibia corresponding with the lateral compartment of the right knee. Additional small foci of increased radiotracer uptake noted in the bilateral wrists, ankles, and shoulders consistent with degenerative uptake. The patient has fairly significant dextroconvex scoliosis of the thoracolumbar spine. Multiple small foci of increased radiotracer uptake throughout the spine corresponding with areas of degenerative change. No focal abnormal increased uptake suggestive of metastatic disease. Normal urinary excretion. IMPRESSION: 1. Negative for osseous metastatic disease. 2. Focal uptake in the lateral compartment  of the right knee suggests underlying osteoarthritis. 3. Dextroconvex scoliosis of the thoracolumbar spine. Electronically Signed   By: Jacqulynn Cadet M.D.   On: 03/06/2021 11:02   DG Foot 2 Views Left  Result Date: 03/09/2021 Please see detailed radiograph report in office note.   ASSESSMENT: This is a very pleasant 80 years old white female recently diagnosed with stage IV (T1c, N2, M1b) non-small cell lung cancer, adenocarcinoma diagnosed in July 2022 and presented with right upper lobe lung nodule in addition to right hilar and mediastinal lymphadenopathy and 3 metastatic brain lesions. The patient also has a history of stage IIB right breast invasive ductal carcinoma status post neoadjuvant systemic chemotherapy with doxorubicin, cyclophosphamide and paclitaxel followed by right mastectomy and adjuvant radiotherapy completed December 2014.  This was managed by [name].   PLAN: I had a lengthy discussion with the patient and her son today about her current disease stage, prognosis and treatment options. I personally and independently reviewed the scan images and discussed the results with the patient and her son. I recommended for the patient to complete the staging work-up by ordering a PET scan to rule out any other metastatic disease. I requested her tissue block [facility] to [facility] for molecular studies and PD-L1 expression. I recommended for the patient to proceed with the [facility] treatment for her brain metastasis as previously planned. I will arrange for the patient to come back for follow-up visit in around 2 weeks for reevaluation and more detailed discussion of her treatment options based on the molecular studies. If the molecular studies showed an actionable mutation, the patient will be treated with targeted therapy but if negative, we may consider her for treatment with palliative systemic chemotherapy with carboplatin for AUC of 5, Alimta 500 Mg/M2 and Keytruda 200 Mg IV  every 3 weeks.  I discussed with the patient the adverse effect of this treatment including but not limited to alopecia, myelosuppression, nausea and vomiting, peripheral neuropathy, liver or renal dysfunction as well as immunotherapy adverse effects. The patient was also given the option of palliative care on hospice but she is interested in treatment. She was advised to call immediately if she has any other concerning symptoms in the interval. The patient voices understanding of current disease status and treatment options and is in agreement with the current care plan.  All questions were answered. The patient knows to call the clinic with any problems, questions or concerns. We can certainly see the patient much sooner if necessary.  Thank you  so much for allowing me to participate in the care of Debra Mcintyre. I will continue to follow up the patient with you and assist in her care.  The total time spent in the appointment was 90 minutes.  Disclaimer: This note was dictated with voice recognition software. Similar sounding words can inadvertently be transcribed and may not be corrected upon review.   Eilleen Kempf March 25, 2021, 8:55 AM

## 2021-03-25 NOTE — Progress Notes (Signed)
I contacted pathology to check to see when the molecular tests were sent to [facility].

## 2021-03-25 NOTE — Progress Notes (Signed)
I spoke with Debra Mcintyre today at her first visit with Dr. Julien Nordmann today.  She is newly dx metastatic lung cancer.  I gave and explained information on dx and resources.  Family discussed with me issues regarding transportation and support in the home. Patient has a home health referral and I completed transportation new patient referral to help with appt transportation.  I also completed referral to [facility] per Dr. Julien Nordmann.

## 2021-03-25 NOTE — Progress Notes (Signed)
I received a message from transportation services that they have contacted Debra Mcintyre and will be providing transportation.

## 2021-03-27 ENCOUNTER — Other Ambulatory Visit: Payer: Self-pay | Admitting: Internal Medicine

## 2021-03-27 DIAGNOSIS — I10 Essential (primary) hypertension: Secondary | ICD-10-CM

## 2021-03-29 DIAGNOSIS — Z8673 Personal history of transient ischemic attack (TIA), and cerebral infarction without residual deficits: Secondary | ICD-10-CM | POA: Diagnosis not present

## 2021-03-29 DIAGNOSIS — Z483 Aftercare following surgery for neoplasm: Secondary | ICD-10-CM | POA: Diagnosis not present

## 2021-03-29 DIAGNOSIS — Z9181 History of falling: Secondary | ICD-10-CM | POA: Diagnosis not present

## 2021-03-29 DIAGNOSIS — Z7952 Long term (current) use of systemic steroids: Secondary | ICD-10-CM | POA: Diagnosis not present

## 2021-03-29 DIAGNOSIS — Z7902 Long term (current) use of antithrombotics/antiplatelets: Secondary | ICD-10-CM | POA: Diagnosis not present

## 2021-03-29 DIAGNOSIS — C50919 Malignant neoplasm of unspecified site of unspecified female breast: Secondary | ICD-10-CM | POA: Diagnosis not present

## 2021-03-30 ENCOUNTER — Encounter: Payer: Self-pay | Admitting: *Deleted

## 2021-03-30 NOTE — Progress Notes (Signed)
I followed up on Debra Mcintyre molecular test results at [facility].  Patient portal indicates completion of test on 8/29.

## 2021-03-31 ENCOUNTER — Encounter (HOSPITAL_COMMUNITY): Payer: Self-pay | Admitting: Internal Medicine

## 2021-03-31 DIAGNOSIS — Z9221 Personal history of antineoplastic chemotherapy: Secondary | ICD-10-CM | POA: Diagnosis not present

## 2021-03-31 DIAGNOSIS — Z79899 Other long term (current) drug therapy: Secondary | ICD-10-CM | POA: Diagnosis not present

## 2021-03-31 DIAGNOSIS — C7931 Secondary malignant neoplasm of brain: Secondary | ICD-10-CM | POA: Diagnosis not present

## 2021-03-31 DIAGNOSIS — Z87891 Personal history of nicotine dependence: Secondary | ICD-10-CM | POA: Diagnosis not present

## 2021-03-31 DIAGNOSIS — Z510 Encounter for antineoplastic radiation therapy: Secondary | ICD-10-CM | POA: Diagnosis not present

## 2021-03-31 DIAGNOSIS — C3491 Malignant neoplasm of unspecified part of right bronchus or lung: Secondary | ICD-10-CM | POA: Diagnosis not present

## 2021-03-31 DIAGNOSIS — C3411 Malignant neoplasm of upper lobe, right bronchus or lung: Secondary | ICD-10-CM | POA: Diagnosis not present

## 2021-04-01 DIAGNOSIS — Z483 Aftercare following surgery for neoplasm: Secondary | ICD-10-CM | POA: Diagnosis not present

## 2021-04-01 DIAGNOSIS — Z9181 History of falling: Secondary | ICD-10-CM | POA: Diagnosis not present

## 2021-04-01 DIAGNOSIS — Z8673 Personal history of transient ischemic attack (TIA), and cerebral infarction without residual deficits: Secondary | ICD-10-CM | POA: Diagnosis not present

## 2021-04-01 DIAGNOSIS — Z7952 Long term (current) use of systemic steroids: Secondary | ICD-10-CM | POA: Diagnosis not present

## 2021-04-01 DIAGNOSIS — C50919 Malignant neoplasm of unspecified site of unspecified female breast: Secondary | ICD-10-CM | POA: Diagnosis not present

## 2021-04-01 DIAGNOSIS — Z7902 Long term (current) use of antithrombotics/antiplatelets: Secondary | ICD-10-CM | POA: Diagnosis not present

## 2021-04-04 ENCOUNTER — Ambulatory Visit
Admission: RE | Admit: 2021-04-04 | Discharge: 2021-04-04 | Disposition: A | Discharge status: Home / Self Care | Payer: Medicare Other | Source: Ambulatory Visit | Attending: Radiation Oncology | Admitting: Radiation Oncology

## 2021-04-04 ENCOUNTER — Other Ambulatory Visit: Payer: Self-pay

## 2021-04-04 DIAGNOSIS — C7931 Secondary malignant neoplasm of brain: Secondary | ICD-10-CM | POA: Diagnosis not present

## 2021-04-04 DIAGNOSIS — Z510 Encounter for antineoplastic radiation therapy: Secondary | ICD-10-CM | POA: Diagnosis not present

## 2021-04-04 DIAGNOSIS — Z79899 Other long term (current) drug therapy: Secondary | ICD-10-CM | POA: Diagnosis not present

## 2021-04-04 DIAGNOSIS — Z87891 Personal history of nicotine dependence: Secondary | ICD-10-CM | POA: Diagnosis not present

## 2021-04-04 DIAGNOSIS — Z9221 Personal history of antineoplastic chemotherapy: Secondary | ICD-10-CM | POA: Diagnosis not present

## 2021-04-04 DIAGNOSIS — C3491 Malignant neoplasm of unspecified part of right bronchus or lung: Secondary | ICD-10-CM | POA: Diagnosis not present

## 2021-04-04 DIAGNOSIS — C3411 Malignant neoplasm of upper lobe, right bronchus or lung: Secondary | ICD-10-CM | POA: Diagnosis not present

## 2021-04-05 ENCOUNTER — Ambulatory Visit
Admission: RE | Admit: 2021-04-05 | Discharge: 2021-04-05 | Disposition: A | Discharge status: Home / Self Care | Payer: Medicare Other | Source: Ambulatory Visit | Attending: Radiation Oncology | Admitting: Radiation Oncology

## 2021-04-05 DIAGNOSIS — C50919 Malignant neoplasm of unspecified site of unspecified female breast: Secondary | ICD-10-CM | POA: Diagnosis not present

## 2021-04-05 DIAGNOSIS — Z8673 Personal history of transient ischemic attack (TIA), and cerebral infarction without residual deficits: Secondary | ICD-10-CM | POA: Diagnosis not present

## 2021-04-05 DIAGNOSIS — Z79899 Other long term (current) drug therapy: Secondary | ICD-10-CM | POA: Diagnosis not present

## 2021-04-05 DIAGNOSIS — Z7902 Long term (current) use of antithrombotics/antiplatelets: Secondary | ICD-10-CM | POA: Diagnosis not present

## 2021-04-05 DIAGNOSIS — C3411 Malignant neoplasm of upper lobe, right bronchus or lung: Secondary | ICD-10-CM | POA: Diagnosis not present

## 2021-04-05 DIAGNOSIS — Z87891 Personal history of nicotine dependence: Secondary | ICD-10-CM | POA: Diagnosis not present

## 2021-04-05 DIAGNOSIS — Z483 Aftercare following surgery for neoplasm: Secondary | ICD-10-CM | POA: Diagnosis not present

## 2021-04-05 DIAGNOSIS — Z510 Encounter for antineoplastic radiation therapy: Secondary | ICD-10-CM | POA: Diagnosis not present

## 2021-04-05 DIAGNOSIS — C7931 Secondary malignant neoplasm of brain: Secondary | ICD-10-CM | POA: Diagnosis not present

## 2021-04-05 DIAGNOSIS — Z9181 History of falling: Secondary | ICD-10-CM | POA: Diagnosis not present

## 2021-04-05 DIAGNOSIS — Z9221 Personal history of antineoplastic chemotherapy: Secondary | ICD-10-CM | POA: Diagnosis not present

## 2021-04-05 DIAGNOSIS — Z7952 Long term (current) use of systemic steroids: Secondary | ICD-10-CM | POA: Diagnosis not present

## 2021-04-06 ENCOUNTER — Other Ambulatory Visit: Payer: Self-pay

## 2021-04-06 ENCOUNTER — Encounter (HOSPITAL_COMMUNITY): Payer: Self-pay | Admitting: Internal Medicine

## 2021-04-06 ENCOUNTER — Ambulatory Visit
Admission: RE | Admit: 2021-04-06 | Discharge: 2021-04-06 | Disposition: A | Discharge status: Home / Self Care | Payer: Medicare Other | Source: Ambulatory Visit | Attending: Radiation Oncology | Admitting: Radiation Oncology

## 2021-04-06 DIAGNOSIS — Z510 Encounter for antineoplastic radiation therapy: Secondary | ICD-10-CM | POA: Diagnosis not present

## 2021-04-06 DIAGNOSIS — Z79899 Other long term (current) drug therapy: Secondary | ICD-10-CM | POA: Diagnosis not present

## 2021-04-06 DIAGNOSIS — Z9221 Personal history of antineoplastic chemotherapy: Secondary | ICD-10-CM | POA: Diagnosis not present

## 2021-04-06 DIAGNOSIS — C3411 Malignant neoplasm of upper lobe, right bronchus or lung: Secondary | ICD-10-CM | POA: Diagnosis not present

## 2021-04-06 DIAGNOSIS — Z87891 Personal history of nicotine dependence: Secondary | ICD-10-CM | POA: Diagnosis not present

## 2021-04-06 DIAGNOSIS — C7931 Secondary malignant neoplasm of brain: Secondary | ICD-10-CM | POA: Diagnosis not present

## 2021-04-07 ENCOUNTER — Ambulatory Visit
Admission: RE | Admit: 2021-04-07 | Discharge: 2021-04-07 | Disposition: A | Discharge status: Home / Self Care | Payer: Medicare Other | Source: Ambulatory Visit | Attending: Radiation Oncology | Admitting: Radiation Oncology

## 2021-04-07 ENCOUNTER — Other Ambulatory Visit: Payer: Self-pay

## 2021-04-07 DIAGNOSIS — Z510 Encounter for antineoplastic radiation therapy: Secondary | ICD-10-CM | POA: Diagnosis not present

## 2021-04-07 DIAGNOSIS — Z79899 Other long term (current) drug therapy: Secondary | ICD-10-CM | POA: Diagnosis not present

## 2021-04-07 DIAGNOSIS — C3411 Malignant neoplasm of upper lobe, right bronchus or lung: Secondary | ICD-10-CM | POA: Diagnosis not present

## 2021-04-07 DIAGNOSIS — C7931 Secondary malignant neoplasm of brain: Secondary | ICD-10-CM | POA: Diagnosis not present

## 2021-04-07 DIAGNOSIS — Z9221 Personal history of antineoplastic chemotherapy: Secondary | ICD-10-CM | POA: Diagnosis not present

## 2021-04-07 DIAGNOSIS — Z87891 Personal history of nicotine dependence: Secondary | ICD-10-CM | POA: Diagnosis not present

## 2021-04-08 ENCOUNTER — Ambulatory Visit (HOSPITAL_COMMUNITY): Admission: RE | Admit: 2021-04-08 | Payer: Medicare Other | Source: Ambulatory Visit

## 2021-04-08 ENCOUNTER — Other Ambulatory Visit: Payer: Self-pay | Admitting: Radiation Oncology

## 2021-04-08 ENCOUNTER — Ambulatory Visit
Admission: RE | Admit: 2021-04-08 | Discharge: 2021-04-08 | Disposition: A | Discharge status: Home / Self Care | Payer: Medicare Other | Source: Ambulatory Visit | Attending: Radiation Oncology | Admitting: Radiation Oncology

## 2021-04-08 DIAGNOSIS — Z87891 Personal history of nicotine dependence: Secondary | ICD-10-CM | POA: Diagnosis not present

## 2021-04-08 DIAGNOSIS — Z9221 Personal history of antineoplastic chemotherapy: Secondary | ICD-10-CM | POA: Diagnosis not present

## 2021-04-08 DIAGNOSIS — C3411 Malignant neoplasm of upper lobe, right bronchus or lung: Secondary | ICD-10-CM | POA: Diagnosis not present

## 2021-04-08 DIAGNOSIS — C7931 Secondary malignant neoplasm of brain: Secondary | ICD-10-CM | POA: Diagnosis not present

## 2021-04-08 DIAGNOSIS — Z510 Encounter for antineoplastic radiation therapy: Secondary | ICD-10-CM | POA: Diagnosis not present

## 2021-04-08 DIAGNOSIS — Z79899 Other long term (current) drug therapy: Secondary | ICD-10-CM | POA: Diagnosis not present

## 2021-04-08 MED ORDER — ONDANSETRON HCL 8 MG PO TABS: 8.0000 mg | ORAL_TABLET | Freq: Three times a day (TID) | ORAL | 1 refills | Status: AC | PRN

## 2021-04-11 ENCOUNTER — Ambulatory Visit
Admission: RE | Admit: 2021-04-11 | Discharge: 2021-04-11 | Disposition: A | Discharge status: Home / Self Care | Payer: Medicare Other | Source: Ambulatory Visit | Attending: Radiation Oncology | Admitting: Radiation Oncology

## 2021-04-11 ENCOUNTER — Other Ambulatory Visit: Payer: Self-pay

## 2021-04-11 DIAGNOSIS — Z87891 Personal history of nicotine dependence: Secondary | ICD-10-CM | POA: Diagnosis not present

## 2021-04-11 DIAGNOSIS — C3491 Malignant neoplasm of unspecified part of right bronchus or lung: Secondary | ICD-10-CM | POA: Diagnosis not present

## 2021-04-11 DIAGNOSIS — Z510 Encounter for antineoplastic radiation therapy: Secondary | ICD-10-CM | POA: Diagnosis not present

## 2021-04-11 DIAGNOSIS — Z79899 Other long term (current) drug therapy: Secondary | ICD-10-CM | POA: Diagnosis not present

## 2021-04-11 DIAGNOSIS — Z9221 Personal history of antineoplastic chemotherapy: Secondary | ICD-10-CM | POA: Diagnosis not present

## 2021-04-11 DIAGNOSIS — C7931 Secondary malignant neoplasm of brain: Secondary | ICD-10-CM | POA: Diagnosis not present

## 2021-04-11 DIAGNOSIS — C3411 Malignant neoplasm of upper lobe, right bronchus or lung: Secondary | ICD-10-CM | POA: Diagnosis not present

## 2021-04-12 ENCOUNTER — Ambulatory Visit
Admission: RE | Admit: 2021-04-12 | Discharge: 2021-04-12 | Disposition: A | Discharge status: Home / Self Care | Payer: Medicare Other | Source: Ambulatory Visit | Attending: Radiation Oncology | Admitting: Radiation Oncology

## 2021-04-12 DIAGNOSIS — C7931 Secondary malignant neoplasm of brain: Secondary | ICD-10-CM | POA: Diagnosis not present

## 2021-04-12 DIAGNOSIS — Z9181 History of falling: Secondary | ICD-10-CM | POA: Diagnosis not present

## 2021-04-12 DIAGNOSIS — Z7952 Long term (current) use of systemic steroids: Secondary | ICD-10-CM | POA: Diagnosis not present

## 2021-04-12 DIAGNOSIS — Z510 Encounter for antineoplastic radiation therapy: Secondary | ICD-10-CM | POA: Diagnosis not present

## 2021-04-12 DIAGNOSIS — Z7902 Long term (current) use of antithrombotics/antiplatelets: Secondary | ICD-10-CM | POA: Diagnosis not present

## 2021-04-12 DIAGNOSIS — Z87891 Personal history of nicotine dependence: Secondary | ICD-10-CM | POA: Diagnosis not present

## 2021-04-12 DIAGNOSIS — Z9221 Personal history of antineoplastic chemotherapy: Secondary | ICD-10-CM | POA: Diagnosis not present

## 2021-04-12 DIAGNOSIS — Z79899 Other long term (current) drug therapy: Secondary | ICD-10-CM | POA: Diagnosis not present

## 2021-04-12 DIAGNOSIS — C3411 Malignant neoplasm of upper lobe, right bronchus or lung: Secondary | ICD-10-CM | POA: Diagnosis not present

## 2021-04-12 DIAGNOSIS — Z8673 Personal history of transient ischemic attack (TIA), and cerebral infarction without residual deficits: Secondary | ICD-10-CM | POA: Diagnosis not present

## 2021-04-12 DIAGNOSIS — C50919 Malignant neoplasm of unspecified site of unspecified female breast: Secondary | ICD-10-CM | POA: Diagnosis not present

## 2021-04-12 DIAGNOSIS — Z483 Aftercare following surgery for neoplasm: Secondary | ICD-10-CM | POA: Diagnosis not present

## 2021-04-13 ENCOUNTER — Encounter (HOSPITAL_COMMUNITY)
Admission: RE | Admit: 2021-04-13 | Discharge: 2021-04-13 | Disposition: A | Discharge status: Home / Self Care | Payer: Medicare Other | Source: Ambulatory Visit | Attending: Internal Medicine | Admitting: Internal Medicine

## 2021-04-13 ENCOUNTER — Ambulatory Visit
Admission: RE | Admit: 2021-04-13 | Discharge: 2021-04-13 | Disposition: A | Discharge status: Home / Self Care | Payer: Medicare Other | Source: Ambulatory Visit | Attending: Radiation Oncology | Admitting: Radiation Oncology

## 2021-04-13 ENCOUNTER — Other Ambulatory Visit: Payer: Self-pay

## 2021-04-13 DIAGNOSIS — C3411 Malignant neoplasm of upper lobe, right bronchus or lung: Secondary | ICD-10-CM | POA: Diagnosis not present

## 2021-04-13 DIAGNOSIS — Z510 Encounter for antineoplastic radiation therapy: Secondary | ICD-10-CM | POA: Diagnosis not present

## 2021-04-13 DIAGNOSIS — K639 Disease of intestine, unspecified: Secondary | ICD-10-CM | POA: Diagnosis not present

## 2021-04-13 DIAGNOSIS — Z9221 Personal history of antineoplastic chemotherapy: Secondary | ICD-10-CM | POA: Diagnosis not present

## 2021-04-13 DIAGNOSIS — K7689 Other specified diseases of liver: Secondary | ICD-10-CM | POA: Diagnosis not present

## 2021-04-13 DIAGNOSIS — Z87891 Personal history of nicotine dependence: Secondary | ICD-10-CM | POA: Diagnosis not present

## 2021-04-13 DIAGNOSIS — Z79899 Other long term (current) drug therapy: Secondary | ICD-10-CM | POA: Diagnosis not present

## 2021-04-13 DIAGNOSIS — C3490 Malignant neoplasm of unspecified part of unspecified bronchus or lung: Secondary | ICD-10-CM

## 2021-04-13 DIAGNOSIS — C7931 Secondary malignant neoplasm of brain: Secondary | ICD-10-CM | POA: Diagnosis not present

## 2021-04-13 LAB — GLUCOSE, CAPILLARY: Glucose-Capillary: 99 mg/dL (ref 70–99)

## 2021-04-13 MED ORDER — FLUDEOXYGLUCOSE F - 18 (FDG) INJECTION
7.6500 | Freq: Once | INTRAVENOUS | Status: AC
  Administered 2021-04-13: 7.65 via INTRAVENOUS

## 2021-04-14 ENCOUNTER — Encounter (HOSPITAL_COMMUNITY): Payer: Self-pay | Admitting: Oncology

## 2021-04-14 ENCOUNTER — Inpatient Hospital Stay: Payer: Medicare Other

## 2021-04-14 ENCOUNTER — Encounter: Payer: Self-pay | Admitting: Internal Medicine

## 2021-04-14 ENCOUNTER — Ambulatory Visit
Admission: RE | Admit: 2021-04-14 | Discharge: 2021-04-14 | Disposition: A | Discharge status: Home / Self Care | Payer: Medicare Other | Source: Ambulatory Visit | Attending: Radiation Oncology | Admitting: Radiation Oncology

## 2021-04-14 ENCOUNTER — Telehealth: Payer: Self-pay

## 2021-04-14 ENCOUNTER — Other Ambulatory Visit (HOSPITAL_COMMUNITY): Payer: Self-pay

## 2021-04-14 ENCOUNTER — Inpatient Hospital Stay: Payer: Medicare Other | Attending: Internal Medicine | Admitting: Internal Medicine

## 2021-04-14 ENCOUNTER — Telehealth: Payer: Self-pay | Admitting: Pharmacist

## 2021-04-14 VITALS — BP 99/61 | HR 75 | Temp 97.9°F | Resp 16 | Ht 69.0 in | Wt 152.8 lb

## 2021-04-14 DIAGNOSIS — Z8673 Personal history of transient ischemic attack (TIA), and cerebral infarction without residual deficits: Secondary | ICD-10-CM | POA: Insufficient documentation

## 2021-04-14 DIAGNOSIS — I10 Essential (primary) hypertension: Secondary | ICD-10-CM | POA: Diagnosis not present

## 2021-04-14 DIAGNOSIS — Z171 Estrogen receptor negative status [ER-]: Secondary | ICD-10-CM | POA: Diagnosis not present

## 2021-04-14 DIAGNOSIS — C7951 Secondary malignant neoplasm of bone: Secondary | ICD-10-CM | POA: Insufficient documentation

## 2021-04-14 DIAGNOSIS — C50411 Malignant neoplasm of upper-outer quadrant of right female breast: Secondary | ICD-10-CM

## 2021-04-14 DIAGNOSIS — C3411 Malignant neoplasm of upper lobe, right bronchus or lung: Secondary | ICD-10-CM | POA: Insufficient documentation

## 2021-04-14 DIAGNOSIS — C786 Secondary malignant neoplasm of retroperitoneum and peritoneum: Secondary | ICD-10-CM | POA: Diagnosis not present

## 2021-04-14 DIAGNOSIS — Z87891 Personal history of nicotine dependence: Secondary | ICD-10-CM | POA: Insufficient documentation

## 2021-04-14 DIAGNOSIS — Z79899 Other long term (current) drug therapy: Secondary | ICD-10-CM | POA: Insufficient documentation

## 2021-04-14 DIAGNOSIS — Z510 Encounter for antineoplastic radiation therapy: Secondary | ICD-10-CM | POA: Insufficient documentation

## 2021-04-14 DIAGNOSIS — R5383 Other fatigue: Secondary | ICD-10-CM | POA: Insufficient documentation

## 2021-04-14 DIAGNOSIS — C7931 Secondary malignant neoplasm of brain: Secondary | ICD-10-CM

## 2021-04-14 DIAGNOSIS — C3490 Malignant neoplasm of unspecified part of unspecified bronchus or lung: Secondary | ICD-10-CM

## 2021-04-14 DIAGNOSIS — Z9221 Personal history of antineoplastic chemotherapy: Secondary | ICD-10-CM | POA: Insufficient documentation

## 2021-04-14 DIAGNOSIS — Z923 Personal history of irradiation: Secondary | ICD-10-CM | POA: Insufficient documentation

## 2021-04-14 DIAGNOSIS — Z5111 Encounter for antineoplastic chemotherapy: Secondary | ICD-10-CM

## 2021-04-14 LAB — CMP (CANCER CENTER ONLY)
ALT: 16 U/L (ref 0–44)
AST: 13 U/L — ABNORMAL LOW (ref 15–41)
Albumin: 3 g/dL — ABNORMAL LOW (ref 3.5–5.0)
Alkaline Phosphatase: 47 U/L (ref 38–126)
Anion gap: 12 (ref 5–15)
BUN: 19 mg/dL (ref 8–23)
CO2: 23 mmol/L (ref 22–32)
Calcium: 9.1 mg/dL (ref 8.9–10.3)
Chloride: 103 mmol/L (ref 98–111)
Creatinine: 1.05 mg/dL — ABNORMAL HIGH (ref 0.44–1.00)
GFR, Estimated: 54 mL/min — ABNORMAL LOW (ref >60)
Glucose, Bld: 90 mg/dL (ref 70–99)
Potassium: 4.1 mmol/L (ref 3.5–5.1)
Sodium: 138 mmol/L (ref 135–145)
Total Bilirubin: 0.9 mg/dL (ref 0.3–1.2)
Total Protein: 6.4 g/dL — ABNORMAL LOW (ref 6.5–8.1)

## 2021-04-14 LAB — CBC WITH DIFFERENTIAL (CANCER CENTER ONLY)
Abs Immature Granulocytes: 0.12 10*3/uL — ABNORMAL HIGH (ref 0.00–0.07)
Basophils Absolute: 0 10*3/uL (ref 0.0–0.1)
Basophils Relative: 0 %
Eosinophils Absolute: 0 10*3/uL (ref 0.0–0.5)
Eosinophils Relative: 0 %
HCT: 38.6 % (ref 36.0–46.0)
Hemoglobin: 13.3 g/dL (ref 12.0–15.0)
Immature Granulocytes: 1 %
Lymphocytes Relative: 6 %
Lymphs Abs: 0.8 10*3/uL (ref 0.7–4.0)
MCH: 30.4 pg (ref 26.0–34.0)
MCHC: 34.5 g/dL (ref 30.0–36.0)
MCV: 88.3 fL (ref 80.0–100.0)
Monocytes Absolute: 0.7 10*3/uL (ref 0.1–1.0)
Monocytes Relative: 5 %
Neutro Abs: 12.6 10*3/uL — ABNORMAL HIGH (ref 1.7–7.7)
Neutrophils Relative %: 88 %
Platelet Count: 248 10*3/uL (ref 150–400)
RBC: 4.37 MIL/uL (ref 3.87–5.11)
RDW: 14.5 % (ref 11.5–15.5)
WBC Count: 14.3 10*3/uL — ABNORMAL HIGH (ref 4.0–10.5)
nRBC: 0 % (ref 0.0–0.2)

## 2021-04-14 MED ORDER — OSIMERTINIB MESYLATE 80 MG PO TABS: 80.0000 mg | ORAL_TABLET | Freq: Every day | ORAL | 3 refills | Status: DC

## 2021-04-14 NOTE — Telephone Encounter (Signed)
Oral Oncology Patient Advocate Encounter   Received notification from [facility] that prior authorization for Tagrisso is required.   PA submitted on CoverMyMeds Key [id number] Status is pending   Oral Oncology Clinic will continue to follow.  [place] Patient Debra Mcintyre Phone [phone] Fax [phone] 04/14/2021 2:49 PM

## 2021-04-14 NOTE — Telephone Encounter (Addendum)
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   Counseled patient on administration, dosing, side effects, monitoring, drug-food interactions, safe handling, storage, and disposal.  Patient will take Tagrisso 80 tablets, 1 tablet by mouth once daily, without regard to food.  Tagrisso start date: patient approved for manufacturer assistance 04/22/21 through [employer] - patient will start once medication is received from [facility].  Adverse effects include but are not limited to: diarrhea, mouth sores, decreased appetitie, fatigue, dry skin, rash, nail changes, altered cardiac conduction, and decreased blood counts or electrolytes.  Patient will obtain anti diarrheal and alert the office of 4 or more loose stools above baseline.   Reviewed with patient importance of keeping a medication schedule and plan for any missed doses. No barriers to medication adherence identified.  Medication reconciliation performed and medication/allergy list updated.  All questions answered.  Debra Mcintyre voiced understanding and appreciation.   Medication education handout given to patient patient. Patient knows to call the office with questions or concerns. Oral Chemotherapy Clinic phone number provided to patient.   Leron Croak, PharmD, BCPS Hematology/Oncology Clinical Pharmacist [facility] [phone] 04/14/2021 1:09 PM

## 2021-04-14 NOTE — Progress Notes (Signed)
START ON PATHWAY REGIMEN - Non-Small Cell Lung     A cycle is every 28 days:     Osimertinib   **Always confirm dose/schedule in your pharmacy ordering system**  Patient Characteristics: Stage IV Metastatic, Nonsquamous, Molecular Analysis Completed, Molecular Alteration Present and Eligible for Molecular Targeted Therapy, Initial Molecular Targeted Therapy, EGFR Mutation - Common (Exon 19 Deletion or Exon 21 L858R Substitution) Therapeutic Status: Stage IV Metastatic Histology: Nonsquamous Cell Broad Molecular Profiling Status: Molecular Analysis Completed Molecular Analysis Results: Alteration Present and Eligible for Molecular Targeted Therapy Molecular Alteration Present: EGFR Mutation - Common (Exon 19 Deletion or Exon 21 L858R Substitution) Molecular Targeted Line of Therapy: Initial Molecular Targeted Therapy Intent of Therapy: Non-Curative / Palliative Intent, Discussed with Patient 

## 2021-04-14 NOTE — Progress Notes (Signed)
[facility] [phone]   [phone]  OFFICE PROGRESS NOTE  Isaac Bliss, Rayford Halsted, MD [place] [address]  DIAGNOSIS:  1) stage IV (T1c, N2, M1b) non-small cell lung cancer, adenocarcinoma diagnosed in July 2022 and presented with right upper lobe lung nodule in addition to right hilar and mediastinal lymphadenopathy and 3 metastatic brain lesions. 2) History of stage IIB right breast invasive ductal carcinoma. This was managed by [name].  Biomarker Findings Microsatellite status - MS-Stable Tumor Mutational Burden - 1 Muts/Mb Genomic Findings For a complete list of the genes assayed, please refer to the Appendix. EGFR L858R MTAP loss exons 2-8 CDKN2A/B CDKN2A loss, CDKN2B loss TP53 E171* 7 Disease relevant genes with no reportable alterations: ALK, BRAF, ERBB2, KRAS, MET, RET, ROS1  PDL1 Expression 60%  PRIOR THERAPY:  1) status post neoadjuvant systemic chemotherapy with doxorubicin, cyclophosphamide and paclitaxel followed by right mastectomy and adjuvant radiotherapy completed December 2014. 2) SRS to metastatic brain lesion under the care of Dr. Lisbeth Renshaw.  CURRENT THERAPY: Tagrisso 80 mg p.o. daily.  Expected to start in the next few days.  INTERVAL HISTORY: Debra Mcintyre 80 y.o. female returns to the clinic today for follow-up visit accompanied by her friend Gladstone Lighter.  The patient is feeling fine today with no concerning complaints except for fatigue.  She is expected to complete the brain radiation tomorrow.  She denied having any current chest pain but has shortness of breath with exertion with mild cough and no hemoptysis.  She denied having any fever or chills.  She has no nausea, vomiting, diarrhea or constipation.  She has no headache but continues to have the visual changes secondary to her recent stroke and brain metastasis.  The patient had molecular studies performed by foundation [facility] that  showed positive EGFR mutation in exon 21 (L858R).  Her PD-L1 expression was 60%.  The patient also had a PET scan performed yesterday and she is here for evaluation and recommendation regarding treatment of her condition.  MEDICAL HISTORY: Past Medical History:  Diagnosis Date   Anemia    Arthritis    Breast cancer ([facility]) 09/2012   right breast/right axillary lymph node t2,pn1, stage 11b, invasive ductal carcinma, grade 3, triple negative, with an mib-1 of 15%   Cataract    bilateral removed   Cerebral aneurysm, nonruptured    Cerebrovascular disease, unspecified    Colonic polyp    Dizziness    pt. states that she experiences this occassionally 05/03/15   Headache    occassionally   Heart murmur    History of chemotherapy    Hx of radiation therapy 05/27/13-08/13/13   right chest wall/right supraclavicular/axillary region 5220 cGy 29 sessions, right mastectomy/chest wall boost cGy 5 sessions   Hypertension    Does not see a cardiologist   Hypothyroidism    Inguinal hernia    Neuropathy    Pure hypercholesterolemia    Rosacea    Seasonal allergies    Stroke ([facility]) 2006   no deficits - TIA   TIA (transient ischemic attack)    Wears glasses     ALLERGIES:  has No Known Allergies.  MEDICATIONS:  Current Outpatient Medications  Medication Sig Dispense Refill   Calcium Carbonate (CALTRATE 600 PO) Take 1 tablet by mouth in the morning and at bedtime.     clopidogrel (PLAVIX) 75 MG tablet Take 1 tablet (75 mg total) by mouth daily. (Patient not  taking: Reported on 03/25/2021) 90 tablet 1   dexamethasone (DECADRON) 4 MG tablet Take 1 tablet (4 mg total) by mouth every 12 (twelve) hours. 60 tablet 1   gabapentin (NEURONTIN) 300 MG capsule Take 1 capsule (300 mg total) by mouth at bedtime. 90 capsule 4   Glycerin-Polysorbate 80 (REFRESH DRY EYE THERAPY OP) Place 1 drop into both eyes daily as needed (Dry eye).     HYDROcodone-acetaminophen (NORCO/VICODIN) 5-325 MG tablet Take 1 tablet  by mouth every 4 (four) hours as needed for moderate pain. 60 tablet 0   levETIRAcetam (KEPPRA) 500 MG tablet Take 1 tablet (500 mg total) by mouth 2 (two) times daily. 60 tablet 1   levothyroxine (SYNTHROID) 137 MCG tablet TAKE 1 TABLET(137 MCG) BY MOUTH DAILY (Patient taking differently: Take 137 mcg by mouth daily before breakfast.) 90 tablet 1   lisinopril (ZESTRIL) 20 MG tablet TAKE 1 TABLET(20 MG) BY MOUTH DAILY (Patient taking differently: Take 10 mg by mouth at bedtime.) 90 tablet 1   memantine (NAMENDA) 10 MG tablet Take 1 tablet (10 mg total) by mouth 2 (two) times daily. (Patient not taking: Reported on 03/25/2021) 60 tablet 4   memantine (NAMENDA) 5 MG tablet Begin this prescription the first day of brain radiation. Week 1: take one tablet po qam. Week 2: take one tablet qam and qpm. Week 3: take two tablets qam, and one tablet po q pm. Week 4: take two tablets qam and qpm. Fill subsequent prescription q month. (Patient not taking: Reported on 03/25/2021) 70 tablet 0   metoprolol succinate (TOPROL-XL) 100 MG 24 hr tablet TAKE 1 TABLET BY MOUTH EVERY DAY WITH OR IMMEDIATELY FOLLOWING A MEAL 90 tablet 0   Multiple Vitamins-Minerals (CENTRUM SILVER ULTRA WOMENS) TABS Take 1 tablet by mouth at bedtime.     ondansetron (ZOFRAN) 8 MG tablet Take 1 tablet (8 mg total) by mouth every 8 (eight) hours as needed for nausea or vomiting. 30 tablet 1   psyllium (METAMUCIL) 58.6 % powder Take 1 packet by mouth daily as needed (Constipation).     rosuvastatin (CRESTOR) 20 MG tablet TAKE 1 TABLET BY MOUTH DAILY 90 tablet 0   sertraline (ZOLOFT) 100 MG tablet TAKE 1 TABLET(100 MG) BY MOUTH DAILY (Patient taking differently: Take 100 mg by mouth in the morning.) 90 tablet 1   No current facility-administered medications for this visit.    SURGICAL HISTORY:  Past Surgical History:  Procedure Laterality Date   APPLICATION OF CRANIAL NAVIGATION Left 03/11/2021   Procedure: APPLICATION OF CRANIAL NAVIGATION;   Surgeon: Dawley, Theodoro Doing, DO;  Location: [facility];  Service: Neurosurgery;  Laterality: Left;   BREAST BIOPSY Right 10/23/2012   BREAST BIOPSY Right 10/08/2012   BREAST BIOPSY Left 08/13/2019   REACTIVE LYMPH NODE WITH FOLLICULAR HYPERPLASIA   COLONOSCOPY     COLONOSCOPY W/ POLYPECTOMY     CRANIOTOMY Left 03/11/2021   Procedure: LEFT FRONTOTEMPORAL CRANIOTOMY FOR RESECTION OF METASTATIC LESION;  Surgeon: Karsten Ro, DO;  Location: [facility];  Service: Neurosurgery;  Laterality: Left;   ESOPHAGOGASTRODUODENOSCOPY     Left middle cerebral artery angioplasty  2004   by [name] had 2 follow up [place] Right 04/23/2013   Procedure: RIGHT TOTAL MASTECTOMY WITH SENTINEL LYMPH NODE BIOPSY;  Surgeon: [name], MD;  Location: [facility];  Service: General;  Laterality: Right;   PORTACATH PLACEMENT N/A 11/05/2012   Procedure: INSERTION PORT-A-CATH;  Surgeon:  [name], MD;  Location: [facility];  Service: General;  Laterality: N/A;   RADIOLOGY WITH ANESTHESIA N/A 05/04/2015   Procedure: MRI BRAIN WITH AND WITHOUT;  Surgeon: Medication Radiologist, MD;  Location: [facility];  Service: Radiology;  Laterality: N/A;    REVIEW OF SYSTEMS:  Constitutional: positive for fatigue Eyes: positive for visual disturbance Ears, nose, mouth, throat, and face: negative Respiratory: positive for dyspnea on exertion Cardiovascular: negative Gastrointestinal: negative Genitourinary:negative Integument/breast: negative Hematologic/lymphatic: negative Musculoskeletal:positive for muscle weakness Neurological: negative Behavioral/Psych: negative Endocrine: negative Allergic/Immunologic: negative   PHYSICAL EXAMINATION: General appearance: alert, cooperative, fatigued, and no distress Head: Normocephalic, without obvious abnormality, atraumatic Neck: no adenopathy, no JVD, supple, symmetrical, trachea midline, and thyroid not enlarged,  symmetric, no tenderness/mass/nodules Lymph nodes: Cervical, supraclavicular, and axillary nodes normal. Resp: clear to auscultation bilaterally Back: symmetric, no curvature. ROM normal. No CVA tenderness. Cardio: regular rate and rhythm, S1, S2 normal, no murmur, click, rub or gallop GI: soft, non-tender; bowel sounds normal; no masses,  no organomegaly Extremities: extremities normal, atraumatic, no cyanosis or edema Neurologic: Alert and oriented X 3, normal strength and tone. Normal symmetric reflexes. Normal coordination and gait  ECOG PERFORMANCE STATUS: 1 - Symptomatic but completely ambulatory  Blood pressure 99/61, pulse 75, temperature 97.9 F (36.6 C), temperature source Tympanic, resp. rate 16, height _0  (1.753 m), weight 152 lb 12.8 oz (69.3 kg), SpO2 97 %.  LABORATORY DATA: Lab Results  Component Value Date   WBC 14.3 (H) 04/14/2021   HGB 13.3 04/14/2021   HCT 38.6 04/14/2021   MCV 88.3 04/14/2021   PLT 248 04/14/2021      Chemistry      Component Value Date/Time   NA 138 04/14/2021 1024   NA 141 03/21/2017 1105   K 4.1 04/14/2021 1024   K 4.3 03/21/2017 1105   CL 103 04/14/2021 1024   CL 102 01/28/2013 0909   CO2 23 04/14/2021 1024   CO2 25 03/21/2017 1105   BUN 19 04/14/2021 1024   BUN 12.5 03/21/2017 1105   CREATININE 1.05 (H) 04/14/2021 1024   CREATININE 0.79 06/30/2020 1446   CREATININE 0.7 03/21/2017 1105      Component Value Date/Time   CALCIUM 9.1 04/14/2021 1024   CALCIUM 9.8 03/21/2017 1105   ALKPHOS 47 04/14/2021 1024   ALKPHOS 48 03/21/2017 1105   AST 13 (L) 04/14/2021 1024   AST 21 03/21/2017 1105   ALT 16 04/14/2021 1024   ALT 21 03/21/2017 1105   BILITOT 0.9 04/14/2021 1024   BILITOT 0.86 03/21/2017 1105       RADIOGRAPHIC STUDIES: NM PET Image Initial (PI) Skull Base To Thigh  Result Date: 04/14/2021 CLINICAL DATA:  Subsequent treatment strategy for non-small cell lung cancer. Brain metastasis. EXAM: NUCLEAR MEDICINE PET  SKULL BASE TO THIGH TECHNIQUE: 7.6 mCi F-18 FDG was injected intravenously. Full-ring PET imaging was performed from the skull base to thigh after the radiotracer. CT data was obtained and used for attenuation correction and anatomic localization. Fasting blood glucose: 99 mg/dl COMPARISON:  CT chest 01/29/2021 FINDINGS: Mediastinal blood pool activity: SUV max 2.3 Liver activity: SUV max NA NECK: No hypermetabolic lymph nodes in the neck. Incidental CT findings: none CHEST: Hypermetabolic nodule in the RIGHT middle lobe measures 2.4 cm (image 83) with SUV max equal 10.1. Hypermetabolic RIGHT hilar nodes with SUV max equal 9.9. Hypermetabolic RIGHT paratracheal nodes and subcarinal nodes. Example RIGHT paratracheal node measuring 10 mm (image 58) with SUV max 8.3.  Hypermetabolic nodule in the pericardial fat inferior to the cardiac apex measuring 2.4 cm (image 101) with SUV max equal 12.7. Incidental CT findings: none ABDOMEN/PELVIS: Hypermetabolic lesion centrally within the liver measures 2.7 cm (image 105). Activity is intense with SUV max equal 13.9. On the CT portion exam this activity appears to localize to the porta hepatis adjacent to the gallbladder may represent a enlarged lymph node. Along the descending colon, there is a nodular lesion which spans the colon wall measuring 2.2 by 1.8 cm (image 110) with SUV max equal 9.5. Within the ventral peritoneal space of the LEFT lower quadrant 15 mm nodule (image 140) with SUV max equal 7.7. Within the deep pelvis, lobular mass associated with the RIGHT ovary measures 3.1 cm has intense metabolic activity. Additional peritoneal implant in the ventral space of the LEFT lower pelvis measures 2 cm with SUV max equal 12 on image 167. Nodular lesion in the RIGHT groin measures 3.4 cm with intense metabolic activity Incidental CT findings: none SKELETON: A focal lesion in the RIGHT iliac wing with SUV max equal 9.6. Minimal CT change. Hypermetabolic lesion at L2 with SUV  max equal 6.9 is also suspicious. Incidental CT findings: none IMPRESSION: 1. Hypermetabolic RIGHT middle lobe nodule, RIGHT hilar adenopathy, and mediastinal hypermetabolic nodal metastasis. Hypermetabolic pericardial fat metastasis. 2. Multiple foci of intensely metabolic nodular lesions within the peritoneal space of the abdomen pelvis. Lesions include large lesion in the porta hepatis, lesions spanning the serosal surface of the descending colon, deep lesion associated with the RIGHT ovary, and two LEFT ventral peritoneal metastasis. 3. Enlarged intensely hypermetabolic RIGHT inguinal nodal metastasis. 4. Solitary skeletal metastasis to the RIGHT iliac wing and L2 vertebral body. Electronically Signed   By: Suzy Bouchard M.D.   On: 04/14/2021 12:52    ASSESSMENT AND PLAN: This is a very pleasant 80 years old white female recently diagnosed with a stage IV (T1c, N2, M1 C) non-small cell lung cancer diagnosed in July 2022 and presented with right upper lobe lung mass in addition to right hilar and mediastinal lymphadenopathy as well as metastatic disease to the brain, porta hepatis as well as descending colon, right ovary, left ventral peritoneal metastasis as well as right inguinal nodal metastasis and solitary skeletal metastasis in the right iliac wing and L2 vertebral body. The patient status post craniotomy with left temporal tumor excision followed by SRS to the 3 brain metastasis.  The patient is expected to finish her radiotherapy tomorrow. She had a PET scan performed yesterday that showed evidence for metastatic disease outside the previously seen lesions in the chest.  The PET scan showed hypermetabolic right middle lobe nodule in addition to right hilar and mediastinal adenopathy as well as hypermetabolic pericardial fat metastasis and multiple foci of metabolic activity in the retroperitoneal space of the abdomen, pelvis including a large lesion in the porta hepatis spanning the serosal surface  of the descending colon and deep lesion associated with the right ovary as well as to left ventral peritoneal metastasis and enlarged with hypermetabolic right inguinal nodal metastasis as well as solitary skeletal metastasis to the right iliac wing and L2 vertebral body. The patient had molecular studies by [facility] and that showed positive EGFR mutation in exon 21 (L858R).  She also has PD-L1 expression of 60%. I had a lengthy discussion with the patient today about her current disease stage, prognosis and treatment options. I personally and independently reviewed her imaging studies and discussed it with the patient  today. I explained to the patient that she has incurable condition and all the treatment will be of palliative nature. With the presence of the positive EGFR mutation, I gave the patient the option of treatment with targeted therapy with Tagrisso 80 mg p.o. daily versus palliative care.  The patient is interested in treatment with Tagrisso.  I discussed with her the adverse effect of this treatment including but not limited to skin rash, diarrhea, interstitial lung disease, liver or renal dysfunction as well as dry skin.  The patient will see ED pharmacist for oral oncolytic today for discussion and education about Tagrisso as well as help with refilling of her medication. She is expected to start the first dose of this treatment in the next few days once delivered to her house. I will see the patient back for follow-up visit in around 2-3 weeks for evaluation and management of any adverse effect of her treatment. The patient was advised to call immediately if she has any other concerning symptoms in the interval. The patient voices understanding of current disease status and treatment options and is in agreement with the current care plan. All questions were answered. The patient knows to call the clinic with any problems, questions or concerns. We can certainly see the patient much  sooner if necessary.  The total time spent in the appointment was 55 minutes.  Disclaimer: This note was dictated with voice recognition software. Similar sounding words can inadvertently be transcribed and may not be corrected upon review.

## 2021-04-14 NOTE — Telephone Encounter (Signed)
Oral Oncology Pharmacist Encounter  Received new prescription for Tagrisso (osimertinib) for the treatment of metastatic non-small cell lung cancer, EGFR exon 21 L858R mutation-positive, planned duration until disease progression or unacceptable drug toxicity.  Prescription dose and frequency assessed for appropriateness.   CBC w/ Diff and CMP from 04/14/21 assessed, no relevant lab abnormalities noted requiring dose adjustment for Tagrisso.  Current medication list in Epic reviewed, DDIs with Tagrisso identified: Category C DDI between [place] and [place], a BCRP/ABCG2 inhibitor may increase serum concentrations of rosuvastatin, possibly leading to increased risk of side effects from rosuvastatin, including but not limited to myalgias. Patient aware to monitor for increased SE.  Category C DDI between [place] and ondansetron due to risk of Qtc prolongation. Noted patient only prescribed ondansetron PRN. Last EKG from 02/10/21, QTc stable (411 ms)  Evaluated chart and no patient barriers to medication adherence noted.   Patient agreement for treatment documented in MD note on 04/14/21.  Prescription has been e-scribed to the [facility] for benefits analysis and approval.  Oral Oncology Clinic will continue to follow for insurance authorization, copayment issues, initial counseling and start date.  Leron Croak, PharmD, BCPS Hematology/Oncology Clinical Pharmacist [facility] [phone] 04/14/2021 12:57 PM

## 2021-04-15 ENCOUNTER — Other Ambulatory Visit (HOSPITAL_COMMUNITY): Payer: Self-pay

## 2021-04-15 ENCOUNTER — Encounter: Payer: Self-pay | Admitting: Radiation Oncology

## 2021-04-15 ENCOUNTER — Other Ambulatory Visit: Payer: Self-pay

## 2021-04-15 ENCOUNTER — Telehealth: Payer: Self-pay | Admitting: Internal Medicine

## 2021-04-15 ENCOUNTER — Ambulatory Visit
Admission: RE | Admit: 2021-04-15 | Discharge: 2021-04-15 | Disposition: A | Discharge status: Home / Self Care | Payer: Medicare Other | Source: Ambulatory Visit | Attending: Radiation Oncology | Admitting: Radiation Oncology

## 2021-04-15 DIAGNOSIS — Z510 Encounter for antineoplastic radiation therapy: Secondary | ICD-10-CM | POA: Diagnosis not present

## 2021-04-15 DIAGNOSIS — C7931 Secondary malignant neoplasm of brain: Secondary | ICD-10-CM | POA: Diagnosis not present

## 2021-04-15 DIAGNOSIS — Z8673 Personal history of transient ischemic attack (TIA), and cerebral infarction without residual deficits: Secondary | ICD-10-CM | POA: Diagnosis not present

## 2021-04-15 DIAGNOSIS — Z7902 Long term (current) use of antithrombotics/antiplatelets: Secondary | ICD-10-CM | POA: Diagnosis not present

## 2021-04-15 DIAGNOSIS — Z79899 Other long term (current) drug therapy: Secondary | ICD-10-CM | POA: Diagnosis not present

## 2021-04-15 DIAGNOSIS — Z7952 Long term (current) use of systemic steroids: Secondary | ICD-10-CM | POA: Diagnosis not present

## 2021-04-15 DIAGNOSIS — Z9181 History of falling: Secondary | ICD-10-CM | POA: Diagnosis not present

## 2021-04-15 DIAGNOSIS — C50919 Malignant neoplasm of unspecified site of unspecified female breast: Secondary | ICD-10-CM | POA: Diagnosis not present

## 2021-04-15 DIAGNOSIS — Z483 Aftercare following surgery for neoplasm: Secondary | ICD-10-CM | POA: Diagnosis not present

## 2021-04-15 DIAGNOSIS — R5383 Other fatigue: Secondary | ICD-10-CM | POA: Diagnosis not present

## 2021-04-15 DIAGNOSIS — C3411 Malignant neoplasm of upper lobe, right bronchus or lung: Secondary | ICD-10-CM | POA: Diagnosis not present

## 2021-04-15 MED ORDER — OSIMERTINIB MESYLATE 80 MG PO TABS
80.0000 mg | ORAL_TABLET | Freq: Every day | ORAL | 3 refills | Status: AC
  Filled 2021-04-15: qty 30, 30d supply, fill #0

## 2021-04-15 NOTE — Progress Notes (Signed)
  Radiation Oncology         [phone]) [phone] ________________________________  Name: Debra Mcintyre  SJG:[id number]  Date of Service: 04/15/21  DOB: 16-May-1941   Steroid Taper Instructions   You currently have a prescription for Dexamethasone 4 mg Tablets.    Beginning 04/18/21: Take 1/2 of a tablet (which is 2 mg) twice a day  Beginning 04/25/21: Take 1/2 of a tablet (which is 2 mg) once a day  Beginning 05/02/21: Take 1/2 of a tablet (which is 2 mg) every other day and stop on 05/11/21.   Please call our office if you have any headaches, visual changes, uncontrolled movements, nausea or vomiting.

## 2021-04-15 NOTE — Telephone Encounter (Signed)
Scheduled appt per 9/1 los - spoke with son and scheduled appt.

## 2021-04-15 NOTE — Telephone Encounter (Signed)
Oral Oncology Patient Advocate Encounter  Prior Authorization for [employer] has been approved.    PA# [id number] Effective dates: 04/14/21 through 08/13/21  Patients co-pay is $2866  Oral Oncology Clinic will continue to follow.   [place] Patient [place] Phone [phone] Fax [phone] 04/15/2021 7:42 AM

## 2021-04-20 DIAGNOSIS — Z9181 History of falling: Secondary | ICD-10-CM | POA: Diagnosis not present

## 2021-04-20 DIAGNOSIS — Z483 Aftercare following surgery for neoplasm: Secondary | ICD-10-CM | POA: Diagnosis not present

## 2021-04-20 DIAGNOSIS — Z7952 Long term (current) use of systemic steroids: Secondary | ICD-10-CM | POA: Diagnosis not present

## 2021-04-20 DIAGNOSIS — Z7902 Long term (current) use of antithrombotics/antiplatelets: Secondary | ICD-10-CM | POA: Diagnosis not present

## 2021-04-20 DIAGNOSIS — C50919 Malignant neoplasm of unspecified site of unspecified female breast: Secondary | ICD-10-CM | POA: Diagnosis not present

## 2021-04-20 DIAGNOSIS — Z8673 Personal history of transient ischemic attack (TIA), and cerebral infarction without residual deficits: Secondary | ICD-10-CM | POA: Diagnosis not present

## 2021-04-21 ENCOUNTER — Emergency Department (HOSPITAL_COMMUNITY)
Admission: EM | Admit: 2021-04-21 | Discharge: 2021-04-21 | Disposition: A | Discharge status: Home / Self Care | Payer: Medicare Other | Attending: Emergency Medicine | Admitting: Emergency Medicine

## 2021-04-21 ENCOUNTER — Emergency Department (HOSPITAL_COMMUNITY): Payer: Medicare Other

## 2021-04-21 DIAGNOSIS — I10 Essential (primary) hypertension: Secondary | ICD-10-CM | POA: Diagnosis not present

## 2021-04-21 DIAGNOSIS — S0990XA Unspecified injury of head, initial encounter: Secondary | ICD-10-CM | POA: Diagnosis not present

## 2021-04-21 DIAGNOSIS — G936 Cerebral edema: Secondary | ICD-10-CM | POA: Diagnosis not present

## 2021-04-21 DIAGNOSIS — Z7902 Long term (current) use of antithrombotics/antiplatelets: Secondary | ICD-10-CM | POA: Diagnosis not present

## 2021-04-21 DIAGNOSIS — Z85118 Personal history of other malignant neoplasm of bronchus and lung: Secondary | ICD-10-CM | POA: Insufficient documentation

## 2021-04-21 DIAGNOSIS — Z87891 Personal history of nicotine dependence: Secondary | ICD-10-CM | POA: Diagnosis not present

## 2021-04-21 DIAGNOSIS — R519 Headache, unspecified: Secondary | ICD-10-CM | POA: Diagnosis not present

## 2021-04-21 DIAGNOSIS — Z79899 Other long term (current) drug therapy: Secondary | ICD-10-CM | POA: Insufficient documentation

## 2021-04-21 DIAGNOSIS — R791 Abnormal coagulation profile: Secondary | ICD-10-CM | POA: Diagnosis not present

## 2021-04-21 DIAGNOSIS — G9389 Other specified disorders of brain: Secondary | ICD-10-CM | POA: Diagnosis not present

## 2021-04-21 DIAGNOSIS — Z853 Personal history of malignant neoplasm of breast: Secondary | ICD-10-CM | POA: Insufficient documentation

## 2021-04-21 DIAGNOSIS — R29818 Other symptoms and signs involving the nervous system: Secondary | ICD-10-CM | POA: Diagnosis not present

## 2021-04-21 DIAGNOSIS — W19XXXA Unspecified fall, initial encounter: Secondary | ICD-10-CM

## 2021-04-21 DIAGNOSIS — E039 Hypothyroidism, unspecified: Secondary | ICD-10-CM | POA: Insufficient documentation

## 2021-04-21 DIAGNOSIS — W010XXA Fall on same level from slipping, tripping and stumbling without subsequent striking against object, initial encounter: Secondary | ICD-10-CM | POA: Diagnosis not present

## 2021-04-21 LAB — CBC
HCT: 40.3 % (ref 36.0–46.0)
Hemoglobin: 13.7 g/dL (ref 12.0–15.0)
MCH: 29.9 pg (ref 26.0–34.0)
MCHC: 34 g/dL (ref 30.0–36.0)
MCV: 88 fL (ref 80.0–100.0)
Platelets: UNDETERMINED 10*3/uL (ref 150–400)
RBC: 4.58 MIL/uL (ref 3.87–5.11)
RDW: 14.1 % (ref 11.5–15.5)
WBC: 15.3 10*3/uL — ABNORMAL HIGH (ref 4.0–10.5)
nRBC: 0 % (ref 0.0–0.2)

## 2021-04-21 LAB — DIFFERENTIAL
Abs Immature Granulocytes: 0.3 10*3/uL — ABNORMAL HIGH (ref 0.00–0.07)
Basophils Absolute: 0 10*3/uL (ref 0.0–0.1)
Basophils Relative: 0 %
Eosinophils Absolute: 0 10*3/uL (ref 0.0–0.5)
Eosinophils Relative: 0 %
Immature Granulocytes: 2 %
Lymphocytes Relative: 5 %
Lymphs Abs: 0.8 10*3/uL (ref 0.7–4.0)
Monocytes Absolute: 0.8 10*3/uL (ref 0.1–1.0)
Monocytes Relative: 5 %
Neutro Abs: 13.3 10*3/uL — ABNORMAL HIGH (ref 1.7–7.7)
Neutrophils Relative %: 88 %

## 2021-04-21 LAB — COMPREHENSIVE METABOLIC PANEL
ALT: 16 U/L (ref 0–44)
AST: 24 U/L (ref 15–41)
Albumin: 3 g/dL — ABNORMAL LOW (ref 3.5–5.0)
Alkaline Phosphatase: 47 U/L (ref 38–126)
Anion gap: 13 (ref 5–15)
BUN: 26 mg/dL — ABNORMAL HIGH (ref 8–23)
CO2: 22 mmol/L (ref 22–32)
Calcium: 8.9 mg/dL (ref 8.9–10.3)
Chloride: 99 mmol/L (ref 98–111)
Creatinine, Ser: 1.12 mg/dL — ABNORMAL HIGH (ref 0.44–1.00)
GFR, Estimated: 50 mL/min — ABNORMAL LOW (ref >60)
Glucose, Bld: 88 mg/dL (ref 70–99)
Potassium: 4.3 mmol/L (ref 3.5–5.1)
Sodium: 134 mmol/L — ABNORMAL LOW (ref 135–145)
Total Bilirubin: 1 mg/dL (ref 0.3–1.2)
Total Protein: 6.2 g/dL — ABNORMAL LOW (ref 6.5–8.1)

## 2021-04-21 LAB — I-STAT CHEM 8, ED
BUN: 33 mg/dL — ABNORMAL HIGH (ref 8–23)
Calcium, Ion: 1 mmol/L — ABNORMAL LOW (ref 1.15–1.40)
Chloride: 104 mmol/L (ref 98–111)
Creatinine, Ser: 1.2 mg/dL — ABNORMAL HIGH (ref 0.44–1.00)
Glucose, Bld: 88 mg/dL (ref 70–99)
HCT: 39 % (ref 36.0–46.0)
Hemoglobin: 13.3 g/dL (ref 12.0–15.0)
Potassium: 4.3 mmol/L (ref 3.5–5.1)
Sodium: 131 mmol/L — ABNORMAL LOW (ref 135–145)
TCO2: 24 mmol/L (ref 22–32)

## 2021-04-21 LAB — CBG MONITORING, ED: Glucose-Capillary: 108 mg/dL — ABNORMAL HIGH (ref 70–99)

## 2021-04-21 LAB — PROTIME-INR
INR: 1 (ref 0.8–1.2)
Prothrombin Time: 13.5 seconds (ref 11.4–15.2)

## 2021-04-21 LAB — APTT: aPTT: 22 seconds — ABNORMAL LOW (ref 24–36)

## 2021-04-21 MED ORDER — SODIUM CHLORIDE 0.9% FLUSH
3.0000 mL | Freq: Once | INTRAVENOUS | Status: AC
  Administered 2021-04-21: 3 mL via INTRAVENOUS

## 2021-04-21 MED ORDER — LACTATED RINGERS IV BOLUS
1000.0000 mL | Freq: Once | INTRAVENOUS | Status: AC
  Administered 2021-04-21: 1000 mL via INTRAVENOUS

## 2021-04-21 NOTE — ED Notes (Signed)
Pt. Husband was updated on the pts condition

## 2021-04-21 NOTE — ED Provider Notes (Addendum)
Emergency Medicine Provider Triage Evaluation Note  Debra Mcintyre , a 80 y.o. female  was evaluated in triage.  Pt complains of fall last night. Pt tripped last night and fell, hitting back of head. No LOC. On plavix. Pt states she has residual stroke effect from event last week and seen at [facility], no note found in chart review. Unknown last normal.  Review of Systems  Positive: Head injury, weakness, SOB Negative: CP  Physical Exam  BP (!) 75/50 (BP Location: Left Arm)   Pulse 79   Temp 97.8 F (36.6 C) (Oral)   Resp 16   SpO2 98%  Gen:   Awake, no distress   Resp:  Normal effort  MSK:   Moves extremities without difficulty  Other:  Left facial droop  Medical Decision Making  Medically screening exam initiated at 1:45 PM.  Appropriate orders placed.  JOHANNY SEGERS was informed that the remainder of the evaluation will be completed by another provider, this initial triage assessment does not replace that evaluation, and the importance of remaining in the ED until their evaluation is complete.     Kateri Plummer, PA-C 04/21/21 1348    Jailyn Leeson T, PA-C 04/21/21 1350    Tegeler, Gwenyth Allegra, MD 04/21/21 1401

## 2021-04-21 NOTE — ED Triage Notes (Signed)
Patient arrived POV; from home. Stated she fell last night around 9pm, + head injury; - LOC. Stated she is on plavix. Patient also stated has left sided droop from recent stroke

## 2021-04-21 NOTE — ED Notes (Signed)
Patient transported to CT 

## 2021-04-21 NOTE — ED Notes (Signed)
Pt's husband was at bedside prior to being given her D/C paperwork. He is aware she will need to be picked up & after she was ready to be discharged he was called & left a message as a reminder to come pick her up. She is in waiting room with all of her belongings waiting for her ride.

## 2021-04-21 NOTE — ED Provider Notes (Signed)
[facility] EMERGENCY DEPARTMENT Provider Note   CSN: [id number] Arrival date & time: 04/21/21  1332     History Chief Complaint  Patient presents with   Noreene Filbert SAMYIA MOTTER is a 80 y.o. female with a pertinent PMH of metastatic breast cancer to the brain s/p elective left frontotemporal stereotactic craniotomy for resection of temporal tumor on 7/29 and non-small cell lung cancer. She has had neoadjuvant systemic chemotherapy followed by right mastectomy and adjuvant radiotherapy completed December 2014.    Pt presents to ED after a fall yesterday. She was at home with her husband when she tripped over her dog and fell to the floor. She was on the ground for "not long" and her husband helped her up. She did not lose consciousness, and did not have any confusion after the incident. No lightheadedness, shaking, CP, SOB, or headache immediately prior to the fall. She had a mild headache after the fall, but it resolved shortly after. She denies hip pain and has no trouble with ambulation after the fall. She denies any current SOB, CP, abdominal pain, changes in vision, confusion, or leg swelling. She complains of poor PO intake recently.     Fall Pertinent negatives include no chest pain, no abdominal pain, no headaches and no shortness of breath.      Past Medical History:  Diagnosis Date   Anemia    Arthritis    Breast cancer ([place]) 09/2012   right breast/right axillary lymph node t2,pn1, stage 11b, invasive ductal carcinma, grade 3, triple negative, with an mib-1 of 15%   Cataract    bilateral removed   Cerebral aneurysm, nonruptured    Cerebrovascular disease, unspecified    Colonic polyp    Dizziness    pt. states that she experiences this occassionally 05/03/15   Headache    occassionally   Heart murmur    History of chemotherapy    Hx of radiation therapy 05/27/13-08/13/13   right chest wall/right supraclavicular/axillary region 5220 cGy 29 sessions,  right mastectomy/chest wall boost cGy 5 sessions   Hypertension    Does not see a cardiologist   Hypothyroidism    Inguinal hernia    Neuropathy    Pure hypercholesterolemia    Rosacea    Seasonal allergies    Stroke ([place]) 2006   no deficits - TIA   TIA (transient ischemic attack)    Wears glasses     Patient Active Problem List   Diagnosis Date Noted   Encounter for antineoplastic chemotherapy 03/25/2021   Lung cancer metastatic to brain ([place]) 03/17/2021   Brain tumor ([place]) 03/11/2021   Brain lesion 03/11/2021   Lung nodule 03/10/2021   Goals of care, counseling/discussion 03/10/2021   Brain metastases ([place]) 02/17/2021   Malignant neoplasm of upper-outer quadrant of right breast in female, estrogen receptor negative ([place]) 05/13/2013   Anemia, unspecified 02/24/2013   GERD (gastroesophageal reflux disease) 12/16/2012   Asthmatic bronchitis 04/21/2011   INTRACRANIAL ANEURYSM 08/28/2009   Cerebrovascular disease 08/28/2009   COLONIC POLYPS 08/26/2009   Essential hypertension 05/12/2008   HEMORRHOIDS 05/12/2008   Hypothyroidism 12/02/2007   HYPERCHOLESTEROLEMIA 12/02/2007   Anxiety state 12/02/2007   Transient cerebral ischemia 12/02/2007   Osteoarthritis 12/02/2007   FIBROMYALGIA 12/02/2007   Osteoporosis 12/02/2007    Past Surgical History:  Procedure Laterality Date   APPLICATION OF CRANIAL NAVIGATION Left 03/11/2021   Procedure: APPLICATION OF CRANIAL NAVIGATION;  Surgeon: Karsten Ro, DO;  Location: [facility] OR;  Service: Neurosurgery;  Laterality: Left;   BREAST BIOPSY Right 10/23/2012   BREAST BIOPSY Right 10/08/2012   BREAST BIOPSY Left 08/13/2019   REACTIVE LYMPH NODE WITH FOLLICULAR HYPERPLASIA   COLONOSCOPY     COLONOSCOPY W/ POLYPECTOMY     CRANIOTOMY Left 03/11/2021   Procedure: LEFT FRONTOTEMPORAL CRANIOTOMY FOR RESECTION OF METASTATIC LESION;  Surgeon: Karsten Ro, DO;  Location: [place];  Service: Neurosurgery;  Laterality: Left;    ESOPHAGOGASTRODUODENOSCOPY     Left middle cerebral artery angioplasty  2004   by [name] had 2 follow up [place] Right 04/23/2013   Procedure: RIGHT TOTAL MASTECTOMY WITH SENTINEL LYMPH NODE BIOPSY;  Surgeon: [name], MD;  Location: [place];  Service: General;  Laterality: Right;   PORTACATH PLACEMENT N/A 11/05/2012   Procedure: INSERTION PORT-A-CATH;  Surgeon: [name], MD;  Location: [place];  Service: General;  Laterality: N/A;   RADIOLOGY WITH ANESTHESIA N/A 05/04/2015   Procedure: MRI BRAIN WITH AND WITHOUT;  Surgeon: Medication Radiologist, MD;  Location: [place];  Service: Radiology;  Laterality: N/A;     OB History   No obstetric history on file.     Family History  Problem Relation Age of Onset   Lung cancer Mother 38   Heart disease Father    Colon cancer Brother 62   Alcohol abuse Brother    Colon cancer Maternal Uncle        dx in his 42s   Colon cancer Maternal Aunt    Ovarian cancer Other 47   Lung cancer Maternal Aunt    Breast cancer Sister    Rectal cancer Neg Hx    Stomach cancer Neg Hx    Esophageal cancer Neg Hx     Social History   Tobacco Use   Smoking status: Former    Packs/day: 1.00    Years: 20.00    Pack years: 20.00    Types: Cigarettes    Quit date: 04/02/1986    Years since quitting: 35.0   Smokeless tobacco: Never  Vaping Use   Vaping Use: Never used  Substance Use Topics   Alcohol use: No   Drug use: No    Home Medications Prior to Admission medications   Medication Sig Start Date End Date Taking? Authorizing Provider  Calcium Carbonate (CALTRATE 600 PO) Take 1 tablet by mouth in the morning and at bedtime.    [provider]  clopidogrel (PLAVIX) 75 MG tablet Take 1 tablet (75 mg total) by mouth daily. Patient not taking: Reported on 03/25/2021 03/25/21   Dawley, Troy C, DO  dexamethasone (DECADRON) 4 MG tablet Take 1 tablet  (4 mg total) by mouth every 12 (twelve) hours. 03/14/21   Dawley, Troy C, DO  gabapentin (NEURONTIN) 300 MG capsule Take 1 capsule (300 mg total) by mouth at bedtime. 02/17/21   Magrinat, Virgie Dad, MD  Glycerin-Polysorbate 80 (REFRESH DRY EYE THERAPY OP) Place 1 drop into both eyes daily as needed (Dry eye).    [provider]  HYDROcodone-acetaminophen (NORCO/VICODIN) 5-325 MG tablet Take 1 tablet by mouth every 4 (four) hours as needed for moderate pain. 03/22/21   Kyung Rudd, MD  levETIRAcetam (KEPPRA) 500 MG tablet Take 1 tablet (500 mg total) by mouth 2 (two) times daily. 03/14/21   Dawley, Troy C, DO  levothyroxine (SYNTHROID) 137 MCG tablet TAKE 1 TABLET(137 MCG) BY MOUTH DAILY Patient taking differently: Take 137  mcg by mouth daily before breakfast. 02/01/21   Isaac Bliss, Rayford Halsted, MD  lisinopril (ZESTRIL) 20 MG tablet TAKE 1 TABLET(20 MG) BY MOUTH DAILY Patient taking differently: Take 10 mg by mouth at bedtime. 11/04/20   Isaac Bliss, Rayford Halsted, MD  memantine (NAMENDA) 10 MG tablet Take 1 tablet (10 mg total) by mouth 2 (two) times daily. Patient not taking: Reported on 03/25/2021 03/22/21   Hayden Pedro, PA-C  memantine [facility]) 5 MG tablet Begin this prescription the first day of brain radiation. Week 1: take one tablet po qam. Week 2: take one tablet qam and qpm. Week 3: take two tablets qam, and one tablet po q pm. Week 4: take two tablets qam and qpm. Fill subsequent prescription q month. Patient not taking: Reported on 03/25/2021 03/22/21   Hayden Pedro, PA-C  metoprolol succinate (TOPROL-XL) 100 MG 24 hr tablet TAKE 1 TABLET BY MOUTH EVERY DAY WITH OR IMMEDIATELY FOLLOWING A MEAL 03/29/21   Isaac Bliss, Rayford Halsted, MD  Multiple Vitamins-Minerals (CENTRUM SILVER ULTRA WOMENS) TABS Take 1 tablet by mouth at bedtime.    [provider]  ondansetron (ZOFRAN) 8 MG tablet Take 1 tablet (8 mg total) by mouth every 8 (eight) hours as needed for nausea or  vomiting. 04/08/21   Kyung Rudd, MD  osimertinib mesylate (TAGRISSO) 80 MG tablet Take 1 tablet (80 mg total) by mouth daily. 04/15/21   Curt Bears, MD  psyllium (METAMUCIL) 58.6 % powder Take 1 packet by mouth daily as needed (Constipation).    [provider]  rosuvastatin (CRESTOR) 20 MG tablet TAKE 1 TABLET BY MOUTH DAILY 03/29/21   Isaac Bliss, Rayford Halsted, MD  sertraline (ZOLOFT) 100 MG tablet TAKE 1 TABLET(100 MG) BY MOUTH DAILY Patient taking differently: Take 100 mg by mouth in the morning. 12/02/20   Isaac Bliss, Rayford Halsted, MD    Allergies    Patient has no known allergies.  Review of Systems   Review of Systems  Respiratory:  Negative for cough, chest tightness and shortness of breath.   Cardiovascular:  Negative for chest pain and leg swelling.  Gastrointestinal:  Negative for abdominal pain and vomiting.  Musculoskeletal:  Negative for back pain, joint swelling and neck pain.  Skin:  Negative for wound.  Neurological:  Positive for weakness. Negative for dizziness, speech difficulty, numbness and headaches.  All other systems reviewed and are negative.  Physical Exam Updated Vital Signs BP (!) 79/51   Pulse 74   Temp 97.8 F (36.6 C) (Oral)   Resp 14   SpO2 97%   Physical Exam Constitutional:      General: She is not in acute distress.    Appearance: Normal appearance.  HENT:     Head: Atraumatic.     Comments: Well healing scars along frontal cranium from recent craniotomy     Mouth/Throat:     Mouth: Mucous membranes are dry.  Eyes:     Extraocular Movements: Extraocular movements intact.     Conjunctiva/sclera: Conjunctivae normal.     Comments: Wearing eye patch over right eye  Cardiovascular:     Rate and Rhythm: Normal rate and regular rhythm.     Pulses: Normal pulses.     Heart sounds: Normal heart sounds.  Pulmonary:     Effort: Pulmonary effort is normal.     Breath sounds: Normal breath sounds.  Abdominal:     General:  Abdomen is flat. Bowel sounds are normal.  Palpations: Abdomen is soft.  Musculoskeletal:     Cervical back: Normal range of motion and neck supple.     Right lower leg: No edema.     Left lower leg: No edema.  Skin:    General: Skin is warm and dry.  Neurological:     General: No focal deficit present.     Mental Status: She is alert and oriented to person, place, and time. Mental status is at baseline.     Motor: No weakness.  Psychiatric:        Mood and Affect: Mood normal.        Behavior: Behavior normal.    ED Results / Procedures / Treatments   Labs (all labs ordered are listed, but only abnormal results are displayed) Labs Reviewed  CBG MONITORING, ED - Abnormal; Notable for the following components:      Result Value   Glucose-Capillary 108 (*)    All other components within normal limits  PROTIME-INR  APTT  CBC  DIFFERENTIAL  COMPREHENSIVE METABOLIC PANEL  I-STAT CHEM 8, ED    EKG EKG Interpretation  Date/Time:  Thursday April 21 2021 14:04:37 EDT Ventricular Rate:  70 PR Interval:  165 QRS Duration: 92 QT Interval:  376 QTC Calculation: 406 R Axis:   -35 Text Interpretation: Sinus rhythm Atrial premature complex Left axis deviation Low voltage, precordial leads RSR' in V1 or V2, right VCD or RVH No significant change since last tracing Confirmed by Blanchie Dessert [phone]) on 04/21/2021 2:13:24 PM  Radiology No results found.  Procedures Procedures   Medications Ordered in ED Medications  sodium chloride flush (NS) 0.9 % injection 3 mL (3 mLs Intravenous Given 04/21/21 1404)  lactated ringers bolus 1,000 mL (1,000 mLs Intravenous New Bag/Given 04/21/21 1417)    ED Course  I have reviewed the triage vital signs and the nursing notes.  Pertinent labs & imaging results that were available during my care of the patient were reviewed by me and considered in my medical decision making (see chart for details).    MDM Rules/Calculators/A&P                            Patient with a fall yesterday with rapid return to baseline. Pt does not complain of headache, CP, SOB, changes in vision, or abdominal pain. Hx consistent with fall; syncope not likely given identified source of fall and no symptoms prior to fall and seizure unlikely given no hx of seizure, no symptoms prior to episode, and no post-ictal confusion. CT head in setting of fall resulting in head trauma reassuring; pt remains at baseline. CBG reassuring, 108. Pt complains of poor PO intake recently with low BP 60-70s, with improvement after 1L bolus of fluids to SBP 95-100s. Mild increase in Bun and Cr since last week expected to improve with continued hydration. Pt ambulated and vitals remain reassuring. Encourage pt to hydrate and to f/u with PCP.     Final Clinical Impression(s) / ED Diagnoses Final diagnoses:  None    Rx / DC Orders ED Discharge Orders     None        Lajean Manes, MD 04/21/21 [id number]    Blanchie Dessert, MD 04/25/21 [phone]

## 2021-04-21 NOTE — Discharge Instructions (Addendum)
Debra Mcintyre you experienced a fall resulting in you hitting your head. We did a scan of your head to showing no sort of new bleeding, or any acute changes requiring treatment of any sort. Your blood pressure was low when you arrived, so we gave you 1 liter of fluids, and your blood pressure improved. Please continue to drink plenty of water to prevent dehydration.

## 2021-04-22 ENCOUNTER — Telehealth: Payer: Self-pay

## 2021-04-22 ENCOUNTER — Telehealth: Payer: Self-pay | Admitting: Medical Oncology

## 2021-04-22 DIAGNOSIS — Z7902 Long term (current) use of antithrombotics/antiplatelets: Secondary | ICD-10-CM | POA: Diagnosis not present

## 2021-04-22 DIAGNOSIS — Z483 Aftercare following surgery for neoplasm: Secondary | ICD-10-CM | POA: Diagnosis not present

## 2021-04-22 DIAGNOSIS — Z7952 Long term (current) use of systemic steroids: Secondary | ICD-10-CM | POA: Diagnosis not present

## 2021-04-22 DIAGNOSIS — Z8673 Personal history of transient ischemic attack (TIA), and cerebral infarction without residual deficits: Secondary | ICD-10-CM | POA: Diagnosis not present

## 2021-04-22 DIAGNOSIS — Z9181 History of falling: Secondary | ICD-10-CM | POA: Diagnosis not present

## 2021-04-22 DIAGNOSIS — C50919 Malignant neoplasm of unspecified site of unspecified female breast: Secondary | ICD-10-CM | POA: Diagnosis not present

## 2021-04-22 NOTE — Telephone Encounter (Signed)
Patient is approved for Tagrisso at no charge from [employer] 04/22/21-08/13/21  [employer] uses [employer].  [place] Patient [place] Phone [phone] Fax [phone] 04/22/2021 3:12 PM

## 2021-04-22 NOTE — Telephone Encounter (Signed)
  [facility] Pt fell yesterday and discharged from ED.  Her friend , Lelon Frohlich , said  she needs a lot of help , safety issues , taking medications.   I spoke to Damita Dunnings ( her son) and he stated she is not doing well and her husband cannot help her.  [facility] is seeing pt ( set up by her surgeon , Dr. Merla Riches) . Harlie is getting PT, OT , RN visits weekly and a nurse aide 2-3 times a week for 4 hour visits . Damita Dunnings said these visits will stop soon.  If Lelon Frohlich starts [place] will it preclude her from going on Hospice?   I told him no, but she may  need to pay out of pocket for tagrisso.  Hydrocodone refill-son stated pt requesting refill that was prescribed by Dr Lisbeth Renshaw . Son wanted her providers to know she has a hx of addiction . I instructed him to contact Radiation Oncology for refill.

## 2021-04-22 NOTE — Telephone Encounter (Signed)
Oral Oncology Patient Advocate Encounter  Met patient in [place] to complete application for [place] [employer] [place] Patient Assistance Program in an effort to reduce the patient's out of pocket expense for Tagrisso to $0.    Application completed and faxed to [phone].   [employer] patient assistance phone number for follow up is [phone].   This encounter will be updated until final determination.  [place] Patient [place] Phone [phone] Fax [phone] 04/22/2021 9:36 AM

## 2021-04-25 ENCOUNTER — Encounter: Payer: Self-pay | Admitting: Internal Medicine

## 2021-04-25 ENCOUNTER — Encounter (HOSPITAL_COMMUNITY): Payer: Self-pay | Admitting: Oncology

## 2021-04-25 NOTE — Progress Notes (Signed)
                                                                                                                                                             Patient Name: GLORY GRAEFE MRN: [id number] DOB: 1940-09-23 Referring Physician: Lelon Frohlich (Profile Not Attached) Date of Service: 04/15/2021 [facility], [place]                                                        End Of Treatment Note  Diagnoses: 174.4-Malignant neoplasm of upper-outer quadrant of female breast 174.9-Malignant neoplasm of breast (female) unspecified site C79.31-Secondary malignant neoplasm of brain  Cancer Staging: Stage IV, NSCLC, adenocarcinoma of the RUL with brain metastases  Intent: Palliative  Radiation Treatment Dates: 04/04/2021 through 04/15/2021 Site Technique Total Dose (Gy) Dose per Fx (Gy) Completed Fx Beam Energies  Brain: Whole Brain Complex 30/30 3 10/10 6X   Narrative: The patient tolerated radiation therapy relatively well. She developed fatigue during radiation. She also noted dryness in her left eye and stinging/burning sensation. She did note an occasional headache. She was given taper instructions as well to discontinue steroids.   Plan: The patient will receive a call in about one month from the radiation oncology department. She will continue follow up with Dr. Julien Nordmann as well.   ________________________________________________    Carola Rhine, [facility]

## 2021-04-26 DIAGNOSIS — Z7952 Long term (current) use of systemic steroids: Secondary | ICD-10-CM | POA: Diagnosis not present

## 2021-04-26 DIAGNOSIS — Z483 Aftercare following surgery for neoplasm: Secondary | ICD-10-CM | POA: Diagnosis not present

## 2021-04-26 DIAGNOSIS — Z8673 Personal history of transient ischemic attack (TIA), and cerebral infarction without residual deficits: Secondary | ICD-10-CM | POA: Diagnosis not present

## 2021-04-26 DIAGNOSIS — Z7902 Long term (current) use of antithrombotics/antiplatelets: Secondary | ICD-10-CM | POA: Diagnosis not present

## 2021-04-26 DIAGNOSIS — C50919 Malignant neoplasm of unspecified site of unspecified female breast: Secondary | ICD-10-CM | POA: Diagnosis not present

## 2021-04-26 DIAGNOSIS — Z9181 History of falling: Secondary | ICD-10-CM | POA: Diagnosis not present

## 2021-04-27 ENCOUNTER — Encounter (HOSPITAL_COMMUNITY): Payer: Self-pay

## 2021-04-28 ENCOUNTER — Other Ambulatory Visit: Payer: Self-pay | Admitting: Radiation Oncology

## 2021-04-28 DIAGNOSIS — Z8673 Personal history of transient ischemic attack (TIA), and cerebral infarction without residual deficits: Secondary | ICD-10-CM | POA: Diagnosis not present

## 2021-04-28 DIAGNOSIS — Z7902 Long term (current) use of antithrombotics/antiplatelets: Secondary | ICD-10-CM | POA: Diagnosis not present

## 2021-04-28 DIAGNOSIS — H16212 Exposure keratoconjunctivitis, left eye: Secondary | ICD-10-CM | POA: Diagnosis not present

## 2021-04-28 DIAGNOSIS — H02234 Paralytic lagophthalmos left upper eyelid: Secondary | ICD-10-CM | POA: Diagnosis not present

## 2021-04-28 DIAGNOSIS — Z9181 History of falling: Secondary | ICD-10-CM | POA: Diagnosis not present

## 2021-04-28 DIAGNOSIS — H02535 Eyelid retraction left lower eyelid: Secondary | ICD-10-CM | POA: Diagnosis not present

## 2021-04-28 DIAGNOSIS — H02155 Paralytic ectropion of left lower eyelid: Secondary | ICD-10-CM | POA: Diagnosis not present

## 2021-04-28 DIAGNOSIS — Z483 Aftercare following surgery for neoplasm: Secondary | ICD-10-CM | POA: Diagnosis not present

## 2021-04-28 DIAGNOSIS — H02235 Paralytic lagophthalmos left lower eyelid: Secondary | ICD-10-CM | POA: Diagnosis not present

## 2021-04-28 DIAGNOSIS — S0452XS Injury of facial nerve, left side, sequela: Secondary | ICD-10-CM | POA: Diagnosis not present

## 2021-04-28 DIAGNOSIS — C50919 Malignant neoplasm of unspecified site of unspecified female breast: Secondary | ICD-10-CM | POA: Diagnosis not present

## 2021-04-28 DIAGNOSIS — Z7952 Long term (current) use of systemic steroids: Secondary | ICD-10-CM | POA: Diagnosis not present

## 2021-04-29 ENCOUNTER — Encounter (HOSPITAL_COMMUNITY): Payer: Self-pay | Admitting: Oncology

## 2021-04-29 ENCOUNTER — Encounter: Payer: Self-pay | Admitting: Internal Medicine

## 2021-05-03 ENCOUNTER — Other Ambulatory Visit: Payer: Self-pay | Admitting: Internal Medicine

## 2021-05-03 ENCOUNTER — Encounter: Payer: Self-pay | Admitting: *Deleted

## 2021-05-03 DIAGNOSIS — Z171 Estrogen receptor negative status [ER-]: Secondary | ICD-10-CM

## 2021-05-03 DIAGNOSIS — C50411 Malignant neoplasm of upper-outer quadrant of right female breast: Secondary | ICD-10-CM

## 2021-05-03 NOTE — Progress Notes (Signed)
Ms. Billiot has a follow up with [facility] PA-C tomorrow. I followed up on her molecular test results.  I printed a copy and placed on her desk.

## 2021-05-04 ENCOUNTER — Telehealth: Payer: Self-pay

## 2021-05-04 ENCOUNTER — Telehealth: Payer: Self-pay | Admitting: Internal Medicine

## 2021-05-04 ENCOUNTER — Ambulatory Visit: Payer: Medicare Other | Admitting: Physician Assistant

## 2021-05-04 ENCOUNTER — Other Ambulatory Visit: Payer: Medicare Other

## 2021-05-04 ENCOUNTER — Other Ambulatory Visit: Payer: Self-pay

## 2021-05-04 DIAGNOSIS — I469 Cardiac arrest, cause unspecified: Secondary | ICD-10-CM | POA: Diagnosis not present

## 2021-05-05 ENCOUNTER — Inpatient Hospital Stay: Payer: Medicare Other | Admitting: Internal Medicine

## 2021-05-11 ENCOUNTER — Telehealth: Payer: Self-pay

## 2021-05-11 NOTE — Telephone Encounter (Signed)
Debra Mcintyre and Barbarann Ehlers called stating that pt death certificate is in [place] system ready to be signed.

## 2021-05-14 DIAGNOSIS — 419620001 Death: Secondary | SNOMED CT | POA: Diagnosis not present

## 2021-05-14 NOTE — Telephone Encounter (Signed)
Patient is now deceased.

## 2021-05-14 NOTE — Progress Notes (Deleted)
[facility] OFFICE PROGRESS NOTE  Isaac Bliss, Rayford Halsted, MD [place] [address]  DIAGNOSIS:  1) stage IV (T1c, N2, M1b) non-small cell lung cancer, adenocarcinoma diagnosed in July 2022 and presented with right upper lobe lung nodule in addition to right hilar and mediastinal lymphadenopathy and 3 metastatic brain lesions. 2) History of stage IIB right breast invasive ductal carcinoma. This was managed by [name].   Biomarker Findings Microsatellite status - MS-Stable Tumor Mutational Burden - 1 Muts/Mb Genomic Findings For a complete list of the genes assayed, please refer to the Appendix. EGFR L858R MTAP loss exons 2-8 CDKN2A/B CDKN2A loss, CDKN2B loss TP53 E171* 7 Disease relevant genes with no reportable alterations: ALK, BRAF, ERBB2, KRAS, MET, RET, ROS1   PDL1 Expression 60%  PRIOR THERAPY:  1) status post neoadjuvant systemic chemotherapy with doxorubicin, cyclophosphamide and paclitaxel followed by right mastectomy and adjuvant radiotherapy completed December 2014. 2) SRS to metastatic brain lesion under the care of Dr. Lisbeth Renshaw. Last dose on 04/15/21.   CURRENT THERAPY: Tagrisso 80 mg p.o. daily.  First dose on ***. Status post *** of treatment.   INTERVAL HISTORY: Debra Mcintyre 80 y.o. female returns to clinic today for follow-up visit.  The patient was recently diagnosed with stage IV lung cancer.  She is positive for an EGFR mutation in exon 21; therefore, she started on targeted treatment with Tagrisso 80 mg p.o. daily.  Her first dose of treatment was on ***.  She is status post ***weeks of treatment.  Overall, she is tolerating her Tagrisso well without any concerning adverse side effects.  She denies any fever, chills, night sweats, or unexplained weight loss.  She denies any chest pain or hemoptysis but reports a mild cough and some dyspnea on exertion which is her baseline.  She denies any nausea, vomiting, diarrhea,  or constipation.  She denies any headaches.  Visual changes?  She underwent SRS to the metastatic brain lesions under the care of Dr. Lisbeth Renshaw earlier this month.  She denies any rashes or skin changes.  Pain?  Takes Norco?  The patient needs a lot of home health assistance and by [facility] is coming to her house and she is undergoing physical therapy, Occupational Therapy, and has a home health nurse that comes 2-3 times per week.  She did go to the emergency room in the interval for a fall.  The patient is here today for evaluation and repeat blood work.    MEDICAL HISTORY: Past Medical History:  Diagnosis Date   Anemia    Arthritis    Breast cancer ([facility]) 09/2012   right breast/right axillary lymph node t2,pn1, stage 11b, invasive ductal carcinma, grade 3, triple negative, with an mib-1 of 15%   Cataract    bilateral removed   Cerebral aneurysm, nonruptured    Cerebrovascular disease, unspecified    Colonic polyp    Dizziness    pt. states that she experiences this occassionally 05/03/15   Headache    occassionally   Heart murmur    History of chemotherapy    Hx of radiation therapy 05/27/13-08/13/13   right chest wall/right supraclavicular/axillary region 5220 cGy 29 sessions, right mastectomy/chest wall boost cGy 5 sessions   Hypertension    Does not see a cardiologist   Hypothyroidism    Inguinal hernia    Neuropathy    Pure hypercholesterolemia    Rosacea    Seasonal allergies    Stroke ([facility]) 2006  no deficits - TIA   TIA (transient ischemic attack)    Wears glasses     ALLERGIES:  has No Known Allergies.  MEDICATIONS:  Current Outpatient Medications  Medication Sig Dispense Refill   Calcium Carbonate (CALTRATE 600 PO) Take 1 tablet by mouth in the morning and at bedtime.     clopidogrel (PLAVIX) 75 MG tablet Take 1 tablet (75 mg total) by mouth daily. 90 tablet 1   dexamethasone (DECADRON) 4 MG tablet Take 1 tablet (4 mg total) by mouth every 12 (twelve) hours.  60 tablet 1   gabapentin (NEURONTIN) 300 MG capsule Take 1 capsule (300 mg total) by mouth at bedtime. 90 capsule 4   HYDROcodone-acetaminophen (NORCO/VICODIN) 5-325 MG tablet Take 1 tablet by mouth every 4 (four) hours as needed for moderate pain. 60 tablet 0   levETIRAcetam (KEPPRA) 500 MG tablet Take 1 tablet (500 mg total) by mouth 2 (two) times daily. 60 tablet 1   levothyroxine (SYNTHROID) 137 MCG tablet TAKE 1 TABLET(137 MCG) BY MOUTH DAILY (Patient taking differently: Take 137 mcg by mouth daily before breakfast.) 90 tablet 1   lisinopril (ZESTRIL) 20 MG tablet TAKE 1 TABLET(20 MG) BY MOUTH DAILY (Patient taking differently: Take 10 mg by mouth at bedtime.) 90 tablet 1   memantine (NAMENDA) 10 MG tablet Take 1 tablet (10 mg total) by mouth 2 (two) times daily. (Patient not taking: No sig reported) 60 tablet 4   memantine (NAMENDA) 5 MG tablet Begin this prescription the first day of brain radiation. Week 1: take one tablet po qam. Week 2: take one tablet qam and qpm. Week 3: take two tablets qam, and one tablet po q pm. Week 4: take two tablets qam and qpm. Fill subsequent prescription q month. 70 tablet 0   metoprolol succinate (TOPROL-XL) 100 MG 24 hr tablet TAKE 1 TABLET BY MOUTH EVERY DAY WITH OR IMMEDIATELY FOLLOWING A MEAL (Patient taking differently: Take 100 mg by mouth daily.) 90 tablet 0   Multiple Vitamins-Minerals (CENTRUM SILVER ULTRA WOMENS) TABS Take 1 tablet by mouth at bedtime.     ondansetron (ZOFRAN) 8 MG tablet Take 1 tablet (8 mg total) by mouth every 8 (eight) hours as needed for nausea or vomiting. 30 tablet 1   osimertinib mesylate (TAGRISSO) 80 MG tablet Take 1 tablet (80 mg total) by mouth daily. 30 tablet 3   psyllium (METAMUCIL) 58.6 % powder Take 1 packet by mouth daily as needed (Constipation).     rosuvastatin (CRESTOR) 20 MG tablet TAKE 1 TABLET BY MOUTH DAILY (Patient taking differently: Take 20 mg by mouth every evening.) 90 tablet 0   sertraline (ZOLOFT) 100  MG tablet TAKE 1 TABLET(100 MG) BY MOUTH DAILY (Patient taking differently: Take 100 mg by mouth in the morning.) 90 tablet 1   No current facility-administered medications for this visit.    SURGICAL HISTORY:  Past Surgical History:  Procedure Laterality Date   APPLICATION OF CRANIAL NAVIGATION Left 03/11/2021   Procedure: APPLICATION OF CRANIAL NAVIGATION;  Surgeon: Dawley, Theodoro Doing, DO;  Location: [facility];  Service: Neurosurgery;  Laterality: Left;   BREAST BIOPSY Right 10/23/2012   BREAST BIOPSY Right 10/08/2012   BREAST BIOPSY Left 08/13/2019   REACTIVE LYMPH NODE WITH FOLLICULAR HYPERPLASIA   COLONOSCOPY     COLONOSCOPY W/ POLYPECTOMY     CRANIOTOMY Left 03/11/2021   Procedure: LEFT FRONTOTEMPORAL CRANIOTOMY FOR RESECTION OF METASTATIC LESION;  Surgeon: Karsten Ro, DO;  Location: [facility];  Service: Neurosurgery;  Laterality: Left;   ESOPHAGOGASTRODUODENOSCOPY     Left middle cerebral artery angioplasty  2004   by [name] had 2 follow up [place] Right 04/23/2013   Procedure: RIGHT TOTAL MASTECTOMY WITH SENTINEL LYMPH NODE BIOPSY;  Surgeon: [name], MD;  Location: [facility];  Service: General;  Laterality: Right;   PORTACATH PLACEMENT N/A 11/05/2012   Procedure: INSERTION PORT-A-CATH;  Surgeon: [name], MD;  Location: [facility];  Service: General;  Laterality: N/A;   RADIOLOGY WITH ANESTHESIA N/A 05/04/2015   Procedure: MRI BRAIN WITH AND WITHOUT;  Surgeon: Medication Radiologist, MD;  Location: [facility];  Service: Radiology;  Laterality: N/A;    REVIEW OF SYSTEMS:   Review of Systems  Constitutional: Negative for appetite change, chills, fatigue, fever and unexpected weight change.  HENT:   Negative for mouth sores, nosebleeds, sore throat and trouble swallowing.   Eyes: Negative for eye problems and icterus.  Respiratory: Negative for cough, hemoptysis, shortness of breath and wheezing.    Cardiovascular: Negative for chest pain and leg swelling.  Gastrointestinal: Negative for abdominal pain, constipation, diarrhea, nausea and vomiting.  Genitourinary: Negative for bladder incontinence, difficulty urinating, dysuria, frequency and hematuria.   Musculoskeletal: Negative for back pain, gait problem, neck pain and neck stiffness.  Skin: Negative for itching and rash.  Neurological: Negative for dizziness, extremity weakness, gait problem, headaches, light-headedness and seizures.  Hematological: Negative for adenopathy. Does not bruise/bleed easily.  Psychiatric/Behavioral: Negative for confusion, depression and sleep disturbance. The patient is not nervous/anxious.     PHYSICAL EXAMINATION:  There were no vitals taken for this visit.  ECOG PERFORMANCE STATUS: {CHL ONC ECOG [id number]  Physical Exam  Constitutional: Oriented to person, place, and time and well-developed, well-nourished, and in no distress. No distress.  HENT:  Head: Normocephalic and atraumatic.  Mouth/Throat: Oropharynx is clear and moist. No oropharyngeal exudate.  Eyes: Conjunctivae are normal. Right eye exhibits no discharge. Left eye exhibits no discharge. No scleral icterus.  Neck: Normal range of motion. Neck supple.  Cardiovascular: Normal rate, regular rhythm, normal heart sounds and intact distal pulses.   Pulmonary/Chest: Effort normal and breath sounds normal. No respiratory distress. No wheezes. No rales.  Abdominal: Soft. Bowel sounds are normal. Exhibits no distension and no mass. There is no tenderness.  Musculoskeletal: Normal range of motion. Exhibits no edema.  Lymphadenopathy:    No cervical adenopathy.  Neurological: Alert and oriented to person, place, and time. Exhibits normal muscle tone. Gait normal. Coordination normal.  Skin: Skin is warm and dry. No rash noted. Not diaphoretic. No erythema. No pallor.  Psychiatric: Mood, memory and judgment normal.  Vitals  reviewed.  LABORATORY DATA: Lab Results  Component Value Date   WBC 15.3 (H) 04/21/2021   HGB 13.3 04/21/2021   HCT 39.0 04/21/2021   MCV 88.0 04/21/2021   PLT PLATELET CLUMPS NOTED ON SMEAR, UNABLE TO ESTIMATE 04/21/2021      Chemistry      Component Value Date/Time   NA 131 (L) 04/21/2021 1444   NA 141 03/21/2017 1105   K 4.3 04/21/2021 1444   K 4.3 03/21/2017 1105   CL 104 04/21/2021 1444   CL 102 01/28/2013 0909   CO2 22 04/21/2021 1353   CO2 25 03/21/2017 1105   BUN 33 (H) 04/21/2021 1444   BUN 12.5 03/21/2017 1105   CREATININE 1.20 (H) 04/21/2021 1444   CREATININE 1.05 (H) 04/14/2021 1024  CREATININE 0.79 06/30/2020 1446   CREATININE 0.7 03/21/2017 1105      Component Value Date/Time   CALCIUM 8.9 04/21/2021 1353   CALCIUM 9.8 03/21/2017 1105   ALKPHOS 47 04/21/2021 1353   ALKPHOS 48 03/21/2017 1105   AST 24 04/21/2021 1353   AST 13 (L) 04/14/2021 1024   AST 21 03/21/2017 1105   ALT 16 04/21/2021 1353   ALT 16 04/14/2021 1024   ALT 21 03/21/2017 1105   BILITOT 1.0 04/21/2021 1353   BILITOT 0.9 04/14/2021 1024   BILITOT 0.86 03/21/2017 1105       RADIOGRAPHIC STUDIES:  CT HEAD WO CONTRAST (5MM)  Result Date: 04/21/2021 CLINICAL DATA:  Neuro deficit, acute, stroke suspected EXAM: CT HEAD WITHOUT CONTRAST TECHNIQUE: Contiguous axial images were obtained from the base of the skull through the vertex without intravenous contrast. COMPARISON:  MRI 03/12/2021. FINDINGS: Brain: No evidence of acute large vascular territory infarction, hemorrhage, hydrocephalus, or extra-axial fluid collection. Left pterional craniotomy with encephalomalacia in the anterior left temporal lobe, compatible sequela of prior tumor resection. Findings suggestive of residual tumor better characterized on prior MRI. Grossly similar 1.2 cm right occipital and 2.4 cm right cerebellar metastatic lesions with similar surrounding edema and small focus of probable mineralization in the cerebellum.  Additional mild for age scattered white matter hypodensities, nonspecific, but compatible with chronic microvascular ischemic disease. Partially empty sella. Vascular: No hyperdense vessel identified. Calcific atherosclerosis. Skull: No acute fracture.  Left pterional craniotomy. Sinuses/Orbits: Clear sinuses.  Unremarkable orbits. Other: No mastoid effusions. IMPRESSION: 1. No evidence of acute large vascular territory infarct. 2. When comparing across modalities, grossly similar 1.2 cm right occipital and 2.4 cm right cerebellar metastatic lesions with similar surrounding edema. Encephalomalacia in the anterior left temporal lobe, compatible with sequela of prior tumor resection with findings suggestive of residual tumor better characterized on prior MRI. An MRI with contrast could evaluate for progressive malignancy if clinically indicated. Electronically Signed   By: Margaretha Sheffield M.D.   On: 04/21/2021 15:24   NM PET Image Initial (PI) Skull Base To Thigh  Result Date: 04/14/2021 CLINICAL DATA:  Subsequent treatment strategy for non-small cell lung cancer. Brain metastasis. EXAM: NUCLEAR MEDICINE PET SKULL BASE TO THIGH TECHNIQUE: 7.6 mCi F-18 FDG was injected intravenously. Full-ring PET imaging was performed from the skull base to thigh after the radiotracer. CT data was obtained and used for attenuation correction and anatomic localization. Fasting blood glucose: 99 mg/dl COMPARISON:  CT chest 01/29/2021 FINDINGS: Mediastinal blood pool activity: SUV max 2.3 Liver activity: SUV max NA NECK: No hypermetabolic lymph nodes in the neck. Incidental CT findings: none CHEST: Hypermetabolic nodule in the RIGHT middle lobe measures 2.4 cm (image 83) with SUV max equal 10.1. Hypermetabolic RIGHT hilar nodes with SUV max equal 9.9. Hypermetabolic RIGHT paratracheal nodes and subcarinal nodes. Example RIGHT paratracheal node measuring 10 mm (image 58) with SUV max 8.3. Hypermetabolic nodule in the pericardial fat  inferior to the cardiac apex measuring 2.4 cm (image 101) with SUV max equal 12.7. Incidental CT findings: none ABDOMEN/PELVIS: Hypermetabolic lesion centrally within the liver measures 2.7 cm (image 105). Activity is intense with SUV max equal 13.9. On the CT portion exam this activity appears to localize to the porta hepatis adjacent to the gallbladder may represent a enlarged lymph node. Along the descending colon, there is a nodular lesion which spans the colon wall measuring 2.2 by 1.8 cm (image 110) with SUV max equal 9.5. Within the ventral peritoneal space  of the LEFT lower quadrant 15 mm nodule (image 140) with SUV max equal 7.7. Within the deep pelvis, lobular mass associated with the RIGHT ovary measures 3.1 cm has intense metabolic activity. Additional peritoneal implant in the ventral space of the LEFT lower pelvis measures 2 cm with SUV max equal 12 on image 167. Nodular lesion in the RIGHT groin measures 3.4 cm with intense metabolic activity Incidental CT findings: none SKELETON: A focal lesion in the RIGHT iliac wing with SUV max equal 9.6. Minimal CT change. Hypermetabolic lesion at L2 with SUV max equal 6.9 is also suspicious. Incidental CT findings: none IMPRESSION: 1. Hypermetabolic RIGHT middle lobe nodule, RIGHT hilar adenopathy, and mediastinal hypermetabolic nodal metastasis. Hypermetabolic pericardial fat metastasis. 2. Multiple foci of intensely metabolic nodular lesions within the peritoneal space of the abdomen pelvis. Lesions include large lesion in the porta hepatis, lesions spanning the serosal surface of the descending colon, deep lesion associated with the RIGHT ovary, and two LEFT ventral peritoneal metastasis. 3. Enlarged intensely hypermetabolic RIGHT inguinal nodal metastasis. 4. Solitary skeletal metastasis to the RIGHT iliac wing and L2 vertebral body. Electronically Signed   By: Suzy Bouchard M.D.   On: 04/14/2021 12:52     ASSESSMENT/PLAN:  This is a very pleasant  80 year old Caucasian female diagnosed with stage IV (T1c, N3, M1 C) non-small cell lung cancer.  She was diagnosed in July 2022.  She presented with a right upper lobe lung mass in addition to right hilar and mediastinal lymphadenopathy as well as metastatic disease to the brain, Horta hepatus, and descending colon, right ovary, left ventral peritoneal metastasis, right inguinal node, solitary skeletal metastasis to the right iliac wing and L2 vertebral body.  She is positive for an EGFR mutation in exon 21.  Her PD-L1 expression is 60%.   She is status postcraniotomy with the left temporal tumor excision followed by SRS to the 3 brain metastases.  She finished this under the care of Dr. Lisbeth Renshaw on 04/15/2021.  The patient is currently undergoing targeted treatment with Tagrisso 80 mg p.o. daily.  Her first dose of treatment was on ***she is status post ***.  Weeks of treatment.  The patient was seen with Dr. Julien Nordmann.  Labs were reviewed.  Recommend that she ***with treatment at the same dose.  Norco  The patient was advised to call immediately if she has any concerning symptoms in the interval. The patient voices understanding of current disease status and treatment options and is in agreement with the current care plan. All questions were answered. The patient knows to call the clinic with any problems, questions or concerns. We can certainly see the patient much sooner if necessary      No orders of the defined types were placed in this encounter.    I spent {CHL ONC TIME VISIT - [id number]} counseling the patient face to face. The total time spent in the appointment was {CHL ONC TIME VISIT - JPVGK:[id number]}.  Derrica Sieg L Amitai Delaughter, PA-C 05/02/21

## 2021-05-14 NOTE — Telephone Encounter (Signed)
Shanon Brow from [facility] called to request a extension on Home Health Orders for PT:  Once a week for 3 weeks  Please call back with the verbal order to [phone] as well as any clarification needed.

## 2021-05-14 NOTE — Telephone Encounter (Signed)
Pts son, Damita Dunnings, called to advise pt passed away this morning 2021-06-03.

## 2021-05-14 DEATH — deceased

## 2021-07-15 ENCOUNTER — Other Ambulatory Visit: Payer: Medicare Other

## 2021-07-18 ENCOUNTER — Ambulatory Visit: Payer: Medicare Other | Admitting: Internal Medicine
